# Patient Record
Sex: Female | Born: 1960 | Race: White | Hispanic: No | Marital: Single | Smoking: Former smoker
Health system: Southern US, Community
[De-identification: ages and names within clinical notes are randomized; demographics above are authoritative.]

## PROBLEM LIST (undated history)

## (undated) DIAGNOSIS — G629 Polyneuropathy, unspecified: Secondary | ICD-10-CM

## (undated) DIAGNOSIS — M51369 Other intervertebral disc degeneration, lumbar region without mention of lumbar back pain or lower extremity pain: Secondary | ICD-10-CM

## (undated) DIAGNOSIS — Z9889 Other specified postprocedural states: Secondary | ICD-10-CM

## (undated) DIAGNOSIS — F32A Depression, unspecified: Secondary | ICD-10-CM

## (undated) DIAGNOSIS — M5136 Other intervertebral disc degeneration, lumbar region: Secondary | ICD-10-CM

## (undated) DIAGNOSIS — E039 Hypothyroidism, unspecified: Secondary | ICD-10-CM

## (undated) DIAGNOSIS — T4145XA Adverse effect of unspecified anesthetic, initial encounter: Secondary | ICD-10-CM

## (undated) DIAGNOSIS — G43909 Migraine, unspecified, not intractable, without status migrainosus: Secondary | ICD-10-CM

## (undated) DIAGNOSIS — F419 Anxiety disorder, unspecified: Secondary | ICD-10-CM

## (undated) DIAGNOSIS — K219 Gastro-esophageal reflux disease without esophagitis: Secondary | ICD-10-CM

## (undated) DIAGNOSIS — R112 Nausea with vomiting, unspecified: Secondary | ICD-10-CM

## (undated) DIAGNOSIS — F4310 Post-traumatic stress disorder, unspecified: Secondary | ICD-10-CM

## (undated) DIAGNOSIS — E079 Disorder of thyroid, unspecified: Secondary | ICD-10-CM

## (undated) DIAGNOSIS — F319 Bipolar disorder, unspecified: Secondary | ICD-10-CM

## (undated) DIAGNOSIS — M1990 Unspecified osteoarthritis, unspecified site: Secondary | ICD-10-CM

## (undated) DIAGNOSIS — E7800 Pure hypercholesterolemia, unspecified: Secondary | ICD-10-CM

## (undated) DIAGNOSIS — F329 Major depressive disorder, single episode, unspecified: Secondary | ICD-10-CM

## (undated) DIAGNOSIS — T8859XA Other complications of anesthesia, initial encounter: Secondary | ICD-10-CM

## (undated) DIAGNOSIS — M797 Fibromyalgia: Secondary | ICD-10-CM

## (undated) HISTORY — DX: Pure hypercholesterolemia, unspecified: E78.00

## (undated) HISTORY — PX: KNEE ARTHROSCOPY: SUR90

## (undated) HISTORY — PX: SHOULDER SURGERY: SHX246

## (undated) HISTORY — PX: VAGINAL HYSTERECTOMY: SUR661

## (undated) HISTORY — DX: Unspecified osteoarthritis, unspecified site: M19.90

## (undated) HISTORY — PX: BILATERAL OOPHORECTOMY: SHX1221

## (undated) HISTORY — DX: Other intervertebral disc degeneration, lumbar region: M51.36

## (undated) HISTORY — DX: Major depressive disorder, single episode, unspecified: F32.9

## (undated) HISTORY — PX: CARPAL TUNNEL RELEASE: SHX101

## (undated) HISTORY — DX: Depression, unspecified: F32.A

## (undated) HISTORY — DX: Anxiety disorder, unspecified: F41.9

## (undated) HISTORY — PX: OTHER SURGICAL HISTORY: SHX169

## (undated) HISTORY — DX: Bipolar disorder, unspecified: F31.9

## (undated) HISTORY — DX: Other intervertebral disc degeneration, lumbar region without mention of lumbar back pain or lower extremity pain: M51.369

## (undated) HISTORY — DX: Disorder of thyroid, unspecified: E07.9

## (undated) HISTORY — DX: Fibromyalgia: M79.7

## (undated) HISTORY — PX: CHOLECYSTECTOMY: SHX55

## (undated) HISTORY — DX: Post-traumatic stress disorder, unspecified: F43.10

## (undated) HISTORY — DX: Polyneuropathy, unspecified: G62.9

---

## 1999-03-29 ENCOUNTER — Encounter: Admission: RE | Admit: 1999-03-29 | Discharge: 1999-06-27 | Payer: Self-pay | Admitting: Internal Medicine

## 2000-11-13 ENCOUNTER — Ambulatory Visit (HOSPITAL_COMMUNITY): Admission: RE | Admit: 2000-11-13 | Discharge: 2000-11-13 | Payer: Self-pay | Admitting: Orthopedic Surgery

## 2001-04-17 ENCOUNTER — Ambulatory Visit (HOSPITAL_COMMUNITY): Admission: RE | Admit: 2001-04-17 | Discharge: 2001-04-17 | Payer: Self-pay | Admitting: Internal Medicine

## 2001-04-17 ENCOUNTER — Encounter: Payer: Self-pay | Admitting: Internal Medicine

## 2001-08-30 ENCOUNTER — Encounter: Payer: Self-pay | Admitting: Emergency Medicine

## 2001-08-30 ENCOUNTER — Emergency Department (HOSPITAL_COMMUNITY): Admission: EM | Admit: 2001-08-30 | Discharge: 2001-08-30 | Payer: Self-pay | Admitting: Emergency Medicine

## 2002-05-22 ENCOUNTER — Inpatient Hospital Stay (HOSPITAL_COMMUNITY): Admission: EM | Admit: 2002-05-22 | Discharge: 2002-05-26 | Payer: Self-pay | Admitting: Emergency Medicine

## 2002-05-22 ENCOUNTER — Encounter: Payer: Self-pay | Admitting: Emergency Medicine

## 2002-05-23 ENCOUNTER — Encounter: Payer: Self-pay | Admitting: Internal Medicine

## 2002-05-29 ENCOUNTER — Inpatient Hospital Stay (HOSPITAL_COMMUNITY): Admission: AD | Admit: 2002-05-29 | Discharge: 2002-05-31 | Payer: Self-pay | Admitting: Internal Medicine

## 2002-05-29 ENCOUNTER — Encounter: Payer: Self-pay | Admitting: Internal Medicine

## 2002-05-30 ENCOUNTER — Encounter: Payer: Self-pay | Admitting: Obstetrics & Gynecology

## 2003-01-10 ENCOUNTER — Ambulatory Visit (HOSPITAL_COMMUNITY): Admission: RE | Admit: 2003-01-10 | Discharge: 2003-01-10 | Payer: Self-pay | Admitting: Internal Medicine

## 2003-01-10 ENCOUNTER — Encounter: Payer: Self-pay | Admitting: Internal Medicine

## 2004-03-10 ENCOUNTER — Emergency Department: Payer: Self-pay | Admitting: Unknown Physician Specialty

## 2004-05-13 ENCOUNTER — Observation Stay (HOSPITAL_COMMUNITY): Admission: EM | Admit: 2004-05-13 | Discharge: 2004-05-15 | Payer: Self-pay | Admitting: *Deleted

## 2006-06-23 ENCOUNTER — Encounter (INDEPENDENT_AMBULATORY_CARE_PROVIDER_SITE_OTHER): Payer: Self-pay | Admitting: Specialist

## 2006-06-23 ENCOUNTER — Ambulatory Visit (HOSPITAL_COMMUNITY): Admission: RE | Admit: 2006-06-23 | Discharge: 2006-06-23 | Payer: Self-pay | Admitting: Obstetrics and Gynecology

## 2007-04-05 ENCOUNTER — Emergency Department (HOSPITAL_COMMUNITY): Admission: EM | Admit: 2007-04-05 | Discharge: 2007-04-05 | Payer: Self-pay | Admitting: Emergency Medicine

## 2007-04-27 ENCOUNTER — Ambulatory Visit (HOSPITAL_COMMUNITY): Admission: RE | Admit: 2007-04-27 | Discharge: 2007-04-27 | Payer: Self-pay | Admitting: Obstetrics and Gynecology

## 2008-02-12 ENCOUNTER — Ambulatory Visit: Payer: Self-pay | Admitting: Cardiology

## 2008-02-12 ENCOUNTER — Inpatient Hospital Stay (HOSPITAL_COMMUNITY): Admission: EM | Admit: 2008-02-12 | Discharge: 2008-02-13 | Payer: Self-pay | Admitting: Emergency Medicine

## 2008-06-09 ENCOUNTER — Ambulatory Visit (HOSPITAL_COMMUNITY): Admission: RE | Admit: 2008-06-09 | Discharge: 2008-06-09 | Payer: Self-pay | Admitting: Internal Medicine

## 2008-10-15 ENCOUNTER — Ambulatory Visit: Payer: Self-pay | Admitting: Orthopedic Surgery

## 2008-10-15 DIAGNOSIS — M25569 Pain in unspecified knee: Secondary | ICD-10-CM | POA: Insufficient documentation

## 2008-10-15 DIAGNOSIS — M23302 Other meniscus derangements, unspecified lateral meniscus, unspecified knee: Secondary | ICD-10-CM | POA: Insufficient documentation

## 2008-10-15 DIAGNOSIS — M24469 Recurrent dislocation, unspecified knee: Secondary | ICD-10-CM | POA: Insufficient documentation

## 2008-10-16 ENCOUNTER — Telehealth: Payer: Self-pay | Admitting: Orthopedic Surgery

## 2008-10-21 ENCOUNTER — Ambulatory Visit (HOSPITAL_COMMUNITY): Admission: RE | Admit: 2008-10-21 | Discharge: 2008-10-21 | Payer: Self-pay | Admitting: Orthopedic Surgery

## 2008-10-27 ENCOUNTER — Encounter (INDEPENDENT_AMBULATORY_CARE_PROVIDER_SITE_OTHER): Payer: Self-pay | Admitting: *Deleted

## 2008-10-27 ENCOUNTER — Ambulatory Visit: Payer: Self-pay | Admitting: Orthopedic Surgery

## 2008-10-28 ENCOUNTER — Ambulatory Visit: Payer: Self-pay | Admitting: Orthopedic Surgery

## 2008-10-28 ENCOUNTER — Ambulatory Visit (HOSPITAL_COMMUNITY): Admission: RE | Admit: 2008-10-28 | Discharge: 2008-10-28 | Payer: Self-pay | Admitting: Orthopedic Surgery

## 2008-10-29 ENCOUNTER — Telehealth: Payer: Self-pay | Admitting: Orthopedic Surgery

## 2008-10-30 ENCOUNTER — Ambulatory Visit: Payer: Self-pay | Admitting: Orthopedic Surgery

## 2008-10-31 ENCOUNTER — Telehealth: Payer: Self-pay | Admitting: Orthopedic Surgery

## 2008-10-31 ENCOUNTER — Encounter: Payer: Self-pay | Admitting: Orthopedic Surgery

## 2008-10-31 ENCOUNTER — Encounter (HOSPITAL_COMMUNITY): Admission: RE | Admit: 2008-10-31 | Discharge: 2008-11-30 | Payer: Self-pay | Admitting: Orthopedic Surgery

## 2008-11-10 ENCOUNTER — Ambulatory Visit: Payer: Self-pay | Admitting: Orthopedic Surgery

## 2008-11-10 ENCOUNTER — Encounter (INDEPENDENT_AMBULATORY_CARE_PROVIDER_SITE_OTHER): Payer: Self-pay | Admitting: *Deleted

## 2008-11-10 DIAGNOSIS — IMO0002 Reserved for concepts with insufficient information to code with codable children: Secondary | ICD-10-CM | POA: Insufficient documentation

## 2008-11-10 DIAGNOSIS — M25469 Effusion, unspecified knee: Secondary | ICD-10-CM | POA: Insufficient documentation

## 2008-11-13 ENCOUNTER — Encounter: Payer: Self-pay | Admitting: Orthopedic Surgery

## 2008-11-18 ENCOUNTER — Ambulatory Visit: Payer: Self-pay | Admitting: Orthopedic Surgery

## 2008-11-19 ENCOUNTER — Encounter: Payer: Self-pay | Admitting: Orthopedic Surgery

## 2008-12-02 ENCOUNTER — Ambulatory Visit: Payer: Self-pay | Admitting: Orthopedic Surgery

## 2008-12-02 DIAGNOSIS — M1710 Unilateral primary osteoarthritis, unspecified knee: Secondary | ICD-10-CM | POA: Insufficient documentation

## 2008-12-24 ENCOUNTER — Encounter: Payer: Self-pay | Admitting: Orthopedic Surgery

## 2009-01-05 ENCOUNTER — Ambulatory Visit: Payer: Self-pay | Admitting: Orthopedic Surgery

## 2009-01-19 ENCOUNTER — Ambulatory Visit: Payer: Self-pay | Admitting: Orthopedic Surgery

## 2009-01-19 DIAGNOSIS — M23329 Other meniscus derangements, posterior horn of medial meniscus, unspecified knee: Secondary | ICD-10-CM | POA: Insufficient documentation

## 2009-01-19 DIAGNOSIS — M549 Dorsalgia, unspecified: Secondary | ICD-10-CM | POA: Insufficient documentation

## 2009-01-22 ENCOUNTER — Telehealth: Payer: Self-pay | Admitting: Orthopedic Surgery

## 2009-01-23 ENCOUNTER — Telehealth: Payer: Self-pay | Admitting: Orthopedic Surgery

## 2009-01-27 ENCOUNTER — Ambulatory Visit (HOSPITAL_COMMUNITY): Admission: RE | Admit: 2009-01-27 | Discharge: 2009-01-27 | Payer: Self-pay | Admitting: Orthopedic Surgery

## 2009-02-02 ENCOUNTER — Ambulatory Visit: Payer: Self-pay | Admitting: Orthopedic Surgery

## 2009-02-25 ENCOUNTER — Ambulatory Visit: Payer: Self-pay | Admitting: Orthopedic Surgery

## 2009-03-03 ENCOUNTER — Telehealth: Payer: Self-pay | Admitting: Orthopedic Surgery

## 2009-03-09 ENCOUNTER — Encounter: Payer: Self-pay | Admitting: Orthopedic Surgery

## 2009-04-08 ENCOUNTER — Ambulatory Visit: Payer: Self-pay | Admitting: Orthopedic Surgery

## 2009-05-12 ENCOUNTER — Ambulatory Visit: Payer: Self-pay | Admitting: Orthopedic Surgery

## 2009-05-12 DIAGNOSIS — S335XXA Sprain of ligaments of lumbar spine, initial encounter: Secondary | ICD-10-CM

## 2009-05-12 DIAGNOSIS — S339XXA Sprain of unspecified parts of lumbar spine and pelvis, initial encounter: Secondary | ICD-10-CM | POA: Insufficient documentation

## 2009-05-27 ENCOUNTER — Ambulatory Visit: Payer: Self-pay | Admitting: Orthopedic Surgery

## 2009-07-08 ENCOUNTER — Ambulatory Visit: Payer: Self-pay | Admitting: Orthopedic Surgery

## 2009-07-08 DIAGNOSIS — M2240 Chondromalacia patellae, unspecified knee: Secondary | ICD-10-CM | POA: Insufficient documentation

## 2009-07-23 ENCOUNTER — Observation Stay (HOSPITAL_COMMUNITY): Admission: AD | Admit: 2009-07-23 | Discharge: 2009-07-25 | Payer: Self-pay | Admitting: Internal Medicine

## 2009-07-24 ENCOUNTER — Encounter (INDEPENDENT_AMBULATORY_CARE_PROVIDER_SITE_OTHER): Payer: Self-pay | Admitting: Internal Medicine

## 2009-08-13 ENCOUNTER — Telehealth: Payer: Self-pay | Admitting: Psychology

## 2009-08-14 ENCOUNTER — Ambulatory Visit (HOSPITAL_COMMUNITY): Payer: Self-pay | Admitting: Psychology

## 2009-08-26 ENCOUNTER — Telehealth: Payer: Self-pay | Admitting: Orthopedic Surgery

## 2009-08-28 ENCOUNTER — Encounter: Payer: Self-pay | Admitting: Orthopedic Surgery

## 2009-12-09 ENCOUNTER — Emergency Department (HOSPITAL_COMMUNITY): Admission: EM | Admit: 2009-12-09 | Discharge: 2009-12-09 | Payer: Self-pay | Admitting: Emergency Medicine

## 2010-01-04 ENCOUNTER — Telehealth (INDEPENDENT_AMBULATORY_CARE_PROVIDER_SITE_OTHER): Payer: Self-pay | Admitting: *Deleted

## 2010-01-14 ENCOUNTER — Telehealth: Payer: Self-pay | Admitting: Orthopedic Surgery

## 2010-01-14 ENCOUNTER — Ambulatory Visit: Payer: Self-pay | Admitting: Orthopedic Surgery

## 2010-01-14 DIAGNOSIS — M5126 Other intervertebral disc displacement, lumbar region: Secondary | ICD-10-CM | POA: Insufficient documentation

## 2010-01-25 ENCOUNTER — Encounter (INDEPENDENT_AMBULATORY_CARE_PROVIDER_SITE_OTHER): Payer: Self-pay | Admitting: *Deleted

## 2010-01-25 ENCOUNTER — Ambulatory Visit (HOSPITAL_COMMUNITY): Admission: RE | Admit: 2010-01-25 | Discharge: 2010-01-25 | Payer: Self-pay | Admitting: Internal Medicine

## 2010-02-01 ENCOUNTER — Encounter (HOSPITAL_COMMUNITY)
Admission: RE | Admit: 2010-02-01 | Discharge: 2010-03-03 | Payer: Self-pay | Source: Home / Self Care | Attending: Orthopedic Surgery | Admitting: Orthopedic Surgery

## 2010-02-02 ENCOUNTER — Ambulatory Visit: Payer: Self-pay | Admitting: Orthopedic Surgery

## 2010-02-02 DIAGNOSIS — M7710 Lateral epicondylitis, unspecified elbow: Secondary | ICD-10-CM | POA: Insufficient documentation

## 2010-02-03 ENCOUNTER — Encounter: Payer: Self-pay | Admitting: Orthopedic Surgery

## 2010-02-04 ENCOUNTER — Encounter: Payer: Self-pay | Admitting: Orthopedic Surgery

## 2010-02-15 ENCOUNTER — Telehealth: Payer: Self-pay | Admitting: Orthopedic Surgery

## 2010-03-02 ENCOUNTER — Telehealth: Payer: Self-pay | Admitting: Orthopedic Surgery

## 2010-04-05 ENCOUNTER — Encounter: Payer: Self-pay | Admitting: Orthopedic Surgery

## 2010-04-09 ENCOUNTER — Encounter: Payer: Self-pay | Admitting: Orthopedic Surgery

## 2010-04-10 ENCOUNTER — Encounter: Payer: Self-pay | Admitting: Internal Medicine

## 2010-04-20 NOTE — Assessment & Plan Note (Signed)
Summary: 2 WK RE-CK BACK/WELLPATH/CAF   Visit Type:  Follow-up Referring Provider:  self Primary Provider:  Dr. Ouida Sills  CC:  back pain.  History of Present Illness: followup visit for [place] she had a knee arthroscopy didn't develop some back pain we treated her with Vicodin, anti-inflammatory ibuprofen, and a muscle relaxer Robaxin  Her back is much better although she had a bad day after a long day at work yesterday.  She still has some intermittent symptoms on the LEFT side of her lower spine and upper iliac region  Exam shows tenderness in the LEFT gluteal region really the back is nontender especially in the midline a little more tenderness on the LEFT side of the back  No radicular symptoms  Recommend continue same medications follow up appointment 3 months  Allergies: No Known Drug Allergies   Impression & Recommendations:  Problem # 1:  LUMBOSACRAL STRAIN (ICD-846.0) Assessment Improved  Orders: Est. Patient Level II ([address])  Patient Instructions: 1)  Please schedule a follow-up appointment in 3 months.

## 2010-04-20 NOTE — Assessment & Plan Note (Signed)
Summary: CHECK KNEE/RE-SCHEDULE SURGERY LT KNEE/ARTHR/WELLPATH/CAF   History of Present Illness: I saw [name] in the office today for a followup visit.  She is a 50 years old woman with the complaint of:  DX: left knee meniscal tear.  Treatment: Ibuprofen, rest, exercises.  MEDS:  Vicodin 5 only as needed as well as Ibuprofen 800mg  at night.  Complaints:  pain not as bad as it was.  Today, scheduled for: to schedule arthroscopy, wants to discuss.  Vicodin, Syntroid, Topamax, Glipizide, Simvastatin, Metformin, Flexeril.  Her pain has resolved significantly to the point where no surgery is needed.  Her knee exam was essentially remarkable only for tenderness in the anterolateral aspect and the has anserine bursa. The joint line is nontender. There is no joint effusion. Her strength was normal. The knee was stable. Meniscal signs were negative including McMurray's exam.  Assessment: Recommend waiting on surgery. Continue activity modification and Vicodin as needed.  Followup in 3 months or sooner if warranted    Allergies: No Known Drug Allergies   Impression & Recommendations:  Problem # 1:  DERANGEMENT OF POSTERIOR HORN OF MEDIAL MENISCUS (ICD-717.2)  Orders: Est. Patient Level II (40102)  Patient Instructions: 1)  3 months f/u or sooner if knee worsens

## 2010-04-20 NOTE — Medication Information (Signed)
Summary: Tax adviser   Imported By: Cammie Sickle 02/12/2010 14:31:05  _____________________________________________________________________  External Attachment:    Type:   Image     Comment:   External Document

## 2010-04-20 NOTE — Letter (Signed)
Summary: Historic Patient File  Historic Patient File   Imported By: Elvera Maria 05/15/2009 10:29:45  _____________________________________________________________________  External Attachment:    Type:   Image     Comment:   history form

## 2010-04-20 NOTE — Progress Notes (Signed)
Summary: call from patient about prescription  Phone Note Call from Patient   Caller: Patient Summary of Call: Patient asking for possible refill for Hydrocodone/Vicodin for LT knee pain,now that she has her appointment scheduled, which is 01/14/10.  States was denied previously. If okayed, her pharmacy is [facility]. Initial call taken by: Cammie Sickle,  January 04, 2010 9:51 AM    Prescriptions: NORCO 5-325 MG TABS (HYDROCODONE-ACETAMINOPHEN) one by mouth q 4 hrs as needed pain  #42 x 1   Entered by:   Ether Griffins   Authorized by:   Fuller Canada MD   Signed by:   Ether Griffins on 01/04/2010   Method used:   Telephoned to ...       [facility]* (retail)       [address]       [place], [place]  [address]       Ph: [phone]       Fax: [phone]   RxID:   [id number]

## 2010-04-20 NOTE — Medication Information (Signed)
Summary: Tax adviser   Imported By: Cammie Sickle 07/06/2009 06:58:01  _____________________________________________________________________  External Attachment:    Type:   Image     Comment:   External Document

## 2010-04-20 NOTE — Letter (Signed)
Summary: Out of Work  [facility]  [address]  [place], [place] [address]   Phone: [phone]  Fax: [phone]    May 12, 2009   Employee:  [name] [facility]    To Whom It May Concern:   For Medical reasons, please excuse the above named employee from work for the following dates:           Start:         May 11, 2009  End/Return to work: May 12, 2009   If you need additional information, please feel free to contact our office.         Sincerely,    Terrance Mass, MD

## 2010-04-20 NOTE — Miscellaneous (Signed)
Summary: pt order  Clinical Lists Changes  Orders: Added new Referral order of Physical Therapy Referral (PT) - Signed 

## 2010-04-20 NOTE — Progress Notes (Signed)
Summary: call from patient, postponed appointment  Phone Note Call from Patient   Caller: Patient Summary of Call: Patient called to postpone fol/up appointment which is scheduled for tomorrow, 08/27/09, due to no insurance at this time. She is checking further  re: other options, incl'g financial assistance. Will call back to re-schedule. Initial call taken by: Cammie Sickle,  August 26, 2009 11:47 AM

## 2010-04-20 NOTE — Letter (Signed)
Summary: Out of Work  [facility]  [address]  [place], [place] [address]   Phone: [phone]  Fax: [phone]    April 08, 2009   Employee:  [name] [facility]    To Whom It May Concern:   For Medical reasons, please excuse the above named employee from work for the following dates:  Start:   April 08, 2009- morning appointment in our office today  End/Return to work:  April 08, 2009, following appointment.  If you need additional information, please feel free to contact our office.         Sincerely,    Terrance Mass, MD

## 2010-04-20 NOTE — Assessment & Plan Note (Signed)
Summary: RE-CK LT KNEE/WELLPATH/CAF   Visit Type:  Follow-up Referring Provider:  self Primary Provider:  Dr. Ouida Sills  CC:  recheck left knee.  History of Present Illness: I saw [name] in the office today for a followup visit.  She is a 50 years old woman with the complaint of:  recheck left knee meniscal tear.  ROS: 2nd Problem Back pain stable at  present no radiculopathy   Still some soreness. stiff after sitting, steps are somewhat bothersome   Vicodin, Syntroid, Topamax, Glipizide, Simvastatin, Metformin, Robaxin and Lyrica.  Allergies: No Known Drug Allergies  Physical Exam  Additional Exam:  GENERAL: Appearance is normal   CDV: normal pulse and temperature   Skin: was normal   Neuro: sensation was normal   MSK left knee  * Inspection no tenderness, no swelling; grinding in the PTF joint  * ROM 0-125 *MOTOR: 5/5  *Stability no joint laxity     Impression & Recommendations:  Problem # 1:  DERANGEMENT OF POSTERIOR HORN OF MEDIAL MENISCUS (ICD-717.2) Assessment Comment Only  stable   Orders: Est. 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 One by mouth q 4 hrs as needed pain    Robaxin 500 Mg Tabs (Methocarbamol) .Marland Kitchen... 1 by mouth q 6 hrs  Orders: Est. Patient Level III (44010)  Patient Instructions: 1)  Please schedule a follow-up appointment in 6 months.

## 2010-04-20 NOTE — Letter (Signed)
Summary: Medical record request Disability Determin  Medical record request Disability Determin   Imported By: Cammie Sickle 09/14/2009 09:34:38  _____________________________________________________________________  External Attachment:    Type:   Image     Comment:   External Document

## 2010-04-20 NOTE — Letter (Signed)
Summary: History form  History form   Imported By: Jacklynn Ganong 02/03/2010 09:29:26  _____________________________________________________________________  External Attachment:    Type:   Image     Comment:   External Document

## 2010-04-20 NOTE — Progress Notes (Signed)
Summary: Help getting services  Phone Note Call from Patient   Caller: Patient Summary of Call: Patient called trying to get services - having trouble with [facility] returning calls.  [name] is not a provider for her insurance.  I did contact the [place] office myself and spoke to [facility].  Appts are available.  I urged [name] to call back ASAP as I had no difficulty getting through today.  She can call back if she continues to have difficulty. Initial call taken by: Spero Geralds PsyD,  Aug 13, 2009 12:07 PM

## 2010-04-20 NOTE — Letter (Signed)
Summary: Work [facility]  [address]  [place], [place] [address]   Phone: [phone]  Fax: [phone]    Today's Date: May 27, 2009  Name of Patient: [name]  The above named patient had a medical visit today at:  am / pm.  Please take this into consideration when reviewing the time away from work/school.    Special Instructions:  [  ] None  [  ] To be off the remainder of today, returning to the normal work / school schedule tomorrow.  [  ] To be off until the next scheduled appointment on ______________________.  [  ] Other ________________________________________________________________ ________________________________________________________________________   Sincerely yours,   Cammie Sickle

## 2010-04-20 NOTE — Assessment & Plan Note (Signed)
Summary: NEW PROB/LT ELBOW PAIN,SWELLING/NEED XRAY/[facility] DISCOUNT/CAF   Visit Type:  new problem Referring Provider:  self Primary Provider:  Dr. Ouida Sills  CC:  left elbow pain.  History of Present Illness: I saw [name] in the office today for an initial visit.  She is a 50 years old woman with the complaint of:  left elbow pain  Xrays today.  Medications: Norco 5mg . Updated in chart. Patient states pain medicine is not working, does not think they are strong enough.  The patient complained of lateral elbow pain since a fall back in June of this year. She now has throbbing, burning, pain, which is 9/10 over the lateral elbow. Timing. She relates as intermittent and started gradually. Associated symptoms include numbness and tingling.  She also complains that it is hard for her to hold things and pick things up. She is already on Lortab 5 mg, Robaxin for her other joint problems, including lumbar pain, and she is in therapy for her lumbar spine condition.    Allergies: No Known Drug Allergies  Past History:  Past Medical History: diabetes htn thyroid cholesterol fibromyalgia Anxiety Depression  Past Surgical History: Carpal Tunnel Release bilateral T A H and B S O Left shoulder Cholecystectomy Knee Arthroscopy right  Review of Systems Musculoskeletal:  Complains of joint pain, swelling, instability, stiffness, and muscle pain; denies redness and heat. Psychiatric:  Complains of depression and anxiety; denies nervousness and hallucinations. Immunology:  Complains of seasonal allergies; denies sinus problems and allergic to bee stings.  The review of systems is negative for Constitutional, Cardiovascular, Respiratory, Gastrointestinal, Genitourinary, Neurologic, Endocrine, Skin, HEENT, and Hemoatologic.   Shoulder/Elbow Exam  General:    Well-developed, well-nourished, abnormal body habitus; no deformities, normal grooming.    Skin:    Intact, no scars,  lesions, rashes, cafe au lait spots or bruising.    Inspection:    swelling: lateral elbow LEFT elbow  Palpation:    tenderness over the lateral wound at the condyle and lateral soft tissue mass  Vascular:    Radial, ulnar, brachial, and axillary pulses 2+ and symmetric; capillary refill less than 2 seconds; no evidence of ischemia, clubbing, or cyanosis.    Sensory:    Gross sensation intact in the upper extremities.    Motor:    Normal strength in the upper extremities.    Elbow Exam:    Left:    Inspection/Palpation:  range of motion of the LEFT elbow is -10, extension, full flexion. Passive range of motion 5, hyperextension, full flexion. There is tenderness over the lateral epicondyle and a positive wrist extension elbow extension test.     Impression & Recommendations:  Problem # 1:  LATERAL EPICONDYLITIS (ICD-726.32) Assessment New  AP lateral, LEFT elbow.  I do not see any evidence of previous fracture. There is no malalignment or 2 spurs at the medial lateral aspect of the distal humerus.  Impression neuroma, LEFT elbow.  Patient will be treated for lateral epicondylitis  Orders: Est. Patient Level IV (78469) Elbow x-ray, 2 views (62952)  Patient Instructions: 1)  You have Tennis Elbow  2)  Start exercises as indicated  3)  apply ice three times a day for 30 minutes  4)  follow instructions in Tennis elbow handout 5)  wear tennis elbow brace  for 6 weeks  6)  (will have to buy ) 7)  elbow follow as needed  8)  Lumbar spine f/u after PT; make appt for 6 weeks  Prescriptions: [facility]  5-325 MG TABS (HYDROCODONE-ACETAMINOPHEN) one by mouth q 4 hrs as needed pain  #84 x 1   Entered and Authorized by:   Fuller Canada MD   Signed by:   Fuller Canada MD on 02/02/2010   Method used:   Print then Give to Patient   RxID:   [id number] ROBAXIN 500 MG TABS (METHOCARBAMOL) 1 by mouth Q 6 HRS  #60 x 2   Entered and Authorized by:   Fuller Canada MD    Signed by:   Fuller Canada MD on 02/02/2010   Method used:   Print then Give to Patient   RxID:   [id number]    Orders Added: 1)  Est. Patient Level IV [01027] 2)  Elbow x-ray, 2 views [73070]
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
## 2010-04-20 NOTE — Progress Notes (Signed)
Summary: Still having knee/back pain  Phone Note Call from Patient   Summary of Call: [name] (February 07, 2061) still having knee and back pain after 2 weeks of therapy.  Says that the traction hurts a lot, wants to know if she needs to continue with the therapy, but discontinue the traction. Her # [phone] Initial call taken by: Jacklynn Ganong,  February 15, 2010 9:44 AM  Follow-up for Phone Call        lets stop the tratction, call back on Thursday and let us know if that helped  Follow-up by: Fuller Canada MD,  February 15, 2010 2:38 PM  Additional Follow-up for Phone Call Additional follow up Details #1::        Left a message to call back Additional Follow-up by: Jacklynn Ganong,  February 15, 2010 3:09 PM    Additional Follow-up for Phone Call Additional follow up Details #2::    Patient called back 02/16/10, was advised of doctor's reply Follow-up by: Jacklynn Ganong,  February 16, 2010 9:10 AM

## 2010-04-20 NOTE — Letter (Signed)
Summary: Work [facility]  [address]  [place], [place] [address]   Phone: [phone]  Fax: [phone]     Today's Date: May 27, 2009  Name of Patient: [name]  The above named patient had a medical visit today at:  9:30 am  Please take this into consideration when reviewing the time away from work.    Special Instructions:   [  ] To Return to the normal work schedule today, May 27, 2009.   [  ] Other ________________________________________________________________ ________________________________________________________________________   Sincerely,   Terrance Mass, MD

## 2010-04-20 NOTE — Miscellaneous (Signed)
Summary: Rehab Report  Rehab Report   Imported By: Jacklynn Ganong 02/04/2010 15:32:53  _____________________________________________________________________  External Attachment:    Type:   Image     Comment:   External Document

## 2010-04-20 NOTE — Letter (Signed)
Summary: Work [facility]  [address]  [place], [place] [address]   Phone: [phone]  Fax: [phone]     Today's Date: May 12, 2009  Name of Patient: [name]  The above named patient had a medical visit today at: 4:00 pm.  Please take this into consideration when reviewing the time away from work.    Special Instructions:   [ X ] To be off work the remainder of today, returning to regular work schedule tomorrow,       May 13, 2009.     Sincerely,   Terrance Mass, MD

## 2010-04-20 NOTE — Assessment & Plan Note (Signed)
Summary: RE-CK/XRAY LT KNEE/M. [facility] DISCOUNT/CAF   Visit Type:  Follow-up Referring Provider:  self Primary Provider:  Dr. Ouida Sills  CC:  right buttock pain.  History of Present Illness:  I saw [name] in the office today for a followup visit.  She is a 50 years old woman with the complaint of:  RIGHT buttock pain, LEFT buttock pain, bilateral occasional leg pain and persistent RIGHT radicular pain as well as pain in her LEFT knee  she complains of pain when she is sitting in her car with bilateral leg pain especially on the RIGHT side.  She also has associated back pain.  She does not complain of nocturnal sweating or fever.  No bowel or bladder dysfunction.   Meds: Norco 5, Syntroid, , Glipizide, Simvastatin, Metformin,  Lyrica, Prozac, Abilify.    Allergies: No Known Drug Allergies  Past History:  Past Medical History: Last updated: 05/12/2009 diabetes htn thyroid cholesterol fibromyalgia  Past Surgical History: Last updated: 10/15/2008 Carpal Tunnel Release bilateral T A H and B S O Left shoulder  Family history reviewed for relevance to current acute and chronic problems. Social history (including risk factors) reviewed for relevance to current acute and chronic problems.  Family History: Reviewed history from 05/12/2009 and no changes required. FH of Cancer:  Family History of Diabetes Family History Coronary Heart Disease female < 4 Family History of Arthritis Hx, family, chronic respiratory condition  Social History: Reviewed history from 05/12/2009 and no changes required. 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  She has tenderness on both sides of the spine as well.  She has tenderness in the RIGHT and LEFT buttocks.  She has a positive RIGHT straight leg raise in the seated position.  Her range of motion in terms of her knee RIGHT and LEFT and ankle RIGHT and LEFT is normal with some pain on range of motion in the LEFT knee.  However, the knee is stable.  Skin RIGHT and LEFT leg, back and neck is normal  Pulses and temperature are normal with no edema in the lower extremities  She has normal sensation in both lower extremities and 3+ RIGHT knee reflex 2+ LEFT knee reflex 1+ bilateral ankle jerks.     Impression & Recommendations:  Problem # 1:  BACK PAIN (ICD-724.5) Assessment Deteriorated  She is really exhibiting classic posturing and symptoms of lumbar disc herniation with the following symptoms and signs.    Bilateral leg pain with sitting Positive straight leg raise Pain despite taking Vicodin and ibuprofen Pain greater than 6 months  Recommend MRI lumbar spine, dose pack for acute symptoms.  Norco for pain.  Her updated medication list for this problem includes:    Vicodin 5-500 Mg Tabs (Hydrocodone-acetaminophen) ..... One by mouth q 4 hrs as needed pain    Robaxin 500 Mg Tabs (Methocarbamol) .Marland Kitchen... 1 by mouth q 6 hrs    Norco 5-325 Mg Tabs (Hydrocodone-acetaminophen) ..... One by mouth q 4 hrs as needed pain  Problem # 2:  H N P-LUMBAR (ICD-722.10) Assessment: New  Medications Added to Medication List This Visit: 1)  Prednisone (pak) 10 Mg Tabs (Prednisone) .... As directed Prescriptions: PREDNISONE (PAK) 10 MG TABS (PREDNISONE) as directed  #1 x 1   Entered and Authorized by:   Fuller Canada MD   Signed by:   Fuller Canada MD on 01/14/2010   Method used:   Print then Give to Patient   RxID:   [id number] NORCO 5-325 MG TABS (HYDROCODONE-ACETAMINOPHEN)  one by mouth q 4 hrs as needed pain  #42 x 1   Entered and Authorized by:   Fuller Canada MD   Signed by:   Fuller Canada MD on 01/14/2010   Method used:   Print then Give to Patient   RxID:   [id number]   Appended Document: RE-CK/XRAY LT KNEE/M. [facility] DISCOUNT/CAF

## 2010-04-20 NOTE — Progress Notes (Signed)
Summary: MRI appointment  Phone Note Outgoing Call   Call placed by: Waldon Reining,  January 14, 2010 4:10 PM Call placed to: Patient Action Taken: Appt scheduled Summary of Call: I called to give the patient her MRI appointment at [facility] on 01-20-10 at 8:30 am. Patient has [facility] discount. Patient will follow up here.

## 2010-04-20 NOTE — Letter (Signed)
Summary: Out of Work  [facility]  [address]  [place], [place] [address]   Phone: [phone]  Fax: [phone]    July 08, 2009   Employee:  RAYOLA [employer]ERH[employer] [facility]    To Whom It May Concern:   For Medical reasons, please excuse the above named employee from work for the following dates:  Start:   July 08, 2009  * patient/employee had an 8:30am appointment today  End/Return to work:   July 08, 2009  If you need additional information, please feel free to contact our office.         Sincerely,    Terrance Mass, MD

## 2010-04-22 NOTE — Miscellaneous (Signed)
Summary: Discharge from PT  Discharge from PT   Imported By: Jacklynn Ganong 04/16/2010 12:59:22  _____________________________________________________________________  External Attachment:    Type:   Image     Comment:   External Document

## 2010-04-22 NOTE — Progress Notes (Signed)
Summary: left knee still hurting  Phone Note Call from Patient   Summary of Call: [name] ([birth year]) has finished PT for her back but her left knee is still hurting pretty much all the time, Seems like it "catches" and has actually gone out on her one time. Asked if you need to see her for the left knee or what do you advise. Her # [phone] Initial call taken by: Jacklynn Ganong,  March 02, 2010 9:53 AM  Follow-up for Phone Call        no further appoinemnts needed at this time   just continue the medications  Follow-up by: Fuller Canada MD,  March 02, 2010 9:56 AM  Additional Follow-up for Phone Call Additional follow up Details #1::        Patient advised Additional Follow-up by: Jacklynn Ganong,  March 02, 2010 10:41 AM

## 2010-04-25 ENCOUNTER — Emergency Department (HOSPITAL_COMMUNITY): Payer: Self-pay

## 2010-04-25 ENCOUNTER — Observation Stay (HOSPITAL_COMMUNITY)
Admission: EM | Admit: 2010-04-25 | Discharge: 2010-04-26 | Disposition: A | Discharge status: Home / Self Care | Payer: Self-pay | Attending: Internal Medicine | Admitting: Internal Medicine

## 2010-04-25 DIAGNOSIS — Z23 Encounter for immunization: Secondary | ICD-10-CM | POA: Insufficient documentation

## 2010-04-25 DIAGNOSIS — H539 Unspecified visual disturbance: Secondary | ICD-10-CM | POA: Insufficient documentation

## 2010-04-25 DIAGNOSIS — Z79899 Other long term (current) drug therapy: Secondary | ICD-10-CM | POA: Insufficient documentation

## 2010-04-25 DIAGNOSIS — E039 Hypothyroidism, unspecified: Secondary | ICD-10-CM | POA: Insufficient documentation

## 2010-04-25 DIAGNOSIS — E119 Type 2 diabetes mellitus without complications: Secondary | ICD-10-CM | POA: Insufficient documentation

## 2010-04-25 DIAGNOSIS — E785 Hyperlipidemia, unspecified: Secondary | ICD-10-CM | POA: Insufficient documentation

## 2010-04-25 DIAGNOSIS — G43909 Migraine, unspecified, not intractable, without status migrainosus: Principal | ICD-10-CM | POA: Insufficient documentation

## 2010-04-25 DIAGNOSIS — K7460 Unspecified cirrhosis of liver: Secondary | ICD-10-CM | POA: Insufficient documentation

## 2010-04-25 DIAGNOSIS — F411 Generalized anxiety disorder: Secondary | ICD-10-CM | POA: Insufficient documentation

## 2010-04-25 DIAGNOSIS — IMO0001 Reserved for inherently not codable concepts without codable children: Secondary | ICD-10-CM | POA: Insufficient documentation

## 2010-04-25 LAB — COMPREHENSIVE METABOLIC PANEL
ALT: 101 U/L — ABNORMAL HIGH (ref 0–35)
AST: 74 U/L — ABNORMAL HIGH (ref 0–37)
Albumin: 4.5 g/dL (ref 3.5–5.2)
Alkaline Phosphatase: 68 U/L (ref 39–117)
BUN: 10 mg/dL (ref 6–23)
CO2: 31 mEq/L (ref 19–32)
Calcium: 9.6 mg/dL (ref 8.4–10.5)
Chloride: 95 mEq/L — ABNORMAL LOW (ref 96–112)
Creatinine, Ser: 0.86 mg/dL (ref 0.4–1.2)
GFR calc Af Amer: >60 mL/min (ref >60)
GFR calc non Af Amer: >60 mL/min (ref >60)
Glucose, Bld: 120 mg/dL — ABNORMAL HIGH (ref 70–99)
Potassium: 4.6 mEq/L (ref 3.5–5.1)
Sodium: 136 mEq/L (ref 135–145)
Total Bilirubin: 0.5 mg/dL (ref 0.3–1.2)
Total Protein: 8.3 g/dL (ref 6.0–8.3)

## 2010-04-25 LAB — URINALYSIS, ROUTINE W REFLEX MICROSCOPIC
Bilirubin Urine: NEGATIVE
Hgb urine dipstick: NEGATIVE
Ketones, ur: NEGATIVE mg/dL
Nitrite: NEGATIVE
Protein, ur: NEGATIVE mg/dL
Specific Gravity, Urine: 1.025 (ref 1.005–1.030)
Urine Glucose, Fasting: NEGATIVE mg/dL
Urobilinogen, UA: 0.2 mg/dL (ref 0.0–1.0)
pH: 6 (ref 5.0–8.0)

## 2010-04-25 LAB — CBC
HCT: 43.7 % (ref 36.0–46.0)
Hemoglobin: 15.5 g/dL — ABNORMAL HIGH (ref 12.0–15.0)
MCH: 32.2 pg (ref 26.0–34.0)
MCHC: 35.5 g/dL (ref 30.0–36.0)
MCV: 90.9 fL (ref 78.0–100.0)
Platelets: 283 10*3/uL (ref 150–400)
RBC: 4.81 MIL/uL (ref 3.87–5.11)
RDW: 12.6 % (ref 11.5–15.5)
WBC: 8.6 10*3/uL (ref 4.0–10.5)

## 2010-04-25 LAB — RAPID URINE DRUG SCREEN, HOSP PERFORMED
Amphetamines: NOT DETECTED
Barbiturates: NOT DETECTED
Benzodiazepines: POSITIVE — AB
Cocaine: NOT DETECTED
Opiates: NOT DETECTED
Tetrahydrocannabinol: NOT DETECTED

## 2010-04-25 LAB — GLUCOSE, CAPILLARY
Glucose-Capillary: 110 mg/dL — ABNORMAL HIGH (ref 70–99)
Glucose-Capillary: 131 mg/dL — ABNORMAL HIGH (ref 70–99)

## 2010-04-25 LAB — DIFFERENTIAL
Basophils Absolute: 0 10*3/uL (ref 0.0–0.1)
Basophils Relative: 1 % (ref 0–1)
Eosinophils Absolute: 0.2 10*3/uL (ref 0.0–0.7)
Eosinophils Relative: 3 % (ref 0–5)
Lymphocytes Relative: 49 % — ABNORMAL HIGH (ref 12–46)
Lymphs Abs: 4.2 10*3/uL — ABNORMAL HIGH (ref 0.7–4.0)
Monocytes Absolute: 0.6 10*3/uL (ref 0.1–1.0)
Monocytes Relative: 7 % (ref 3–12)
Neutro Abs: 3.5 10*3/uL (ref 1.7–7.7)
Neutrophils Relative %: 41 % — ABNORMAL LOW (ref 43–77)

## 2010-04-25 LAB — AMMONIA: Ammonia: 13 umol/L (ref 11–35)

## 2010-04-25 LAB — POCT CARDIAC MARKERS
CKMB, poc: 2.9 ng/mL (ref 1.0–8.0)
Myoglobin, poc: 72.9 ng/mL (ref 12–200)
Troponin i, poc: <0.05 ng/mL (ref 0.00–0.09)

## 2010-04-26 LAB — GLUCOSE, CAPILLARY: Glucose-Capillary: 147 mg/dL — ABNORMAL HIGH (ref 70–99)

## 2010-04-28 NOTE — Letter (Signed)
Summary: medical records request [place] [facility]  medical records request [place] [facility]   Imported By: Cammie Sickle 04/23/2010 14:42:37  _____________________________________________________________________  External Attachment:    Type:   Image     Comment:   External Document

## 2010-05-04 NOTE — H&P (Signed)
[name], [name]              ACCOUNT NO.:  [id number]  MEDICAL RECORD NO.:  [id number]           PATIENT TYPE:  I  LOCATION:  A313                          FACILITY:  [facility]  PHYSICIAN:  Kingsley Callander. Ouida Sills, MD       DATE OF BIRTH:  August 23, 1960  DATE OF ADMISSION:  04/25/2010 DATE OF DISCHARGE:  02/06/2012LH                             HISTORY & PHYSICAL   CHIEF COMPLAINT:  Headache.  HISTORY OF PRESENT ILLNESS:  This patient is a 50 year old white female who presented to the emergency room with a knife-like headache and associated change in vision with her left eye.  She described first blurry vision and then difficulty seeing well with the left eye.  She has a history of migraine headaches.  She has recently been off Topamax as her preventive therapy.  She has also not had access to Imitrex due to having lost her job and not yet been enrolled with Medicaid.  Upon evaluation in the emergency room, she underwent a CT scan of the brain which revealed no evidence of hemorrhage or stroke.  She was hospitalized overnight for observation.  She has not experienced any focal weakness or change in coordination.  PAST MEDICAL HISTORY: 1. Migraine headaches. 2. Diabetes. 3. Fibromyalgia. 4. Hyperlipidemia. 5. Nonalcoholic fatty liver disease. 6. Anxiety. 7. Hypothyroidism. 8. Hysterectomy. 9. Shoulder surgery. 10.Carpal tunnel syndrome. 11.History of tubal pregnancy.  MEDICATIONS: 1. Lyrica 75 mg b.i.d. 2. Metformin ER 2000 mg daily. 3. Glipizide XL 10 mg daily. 4. Imitrex 100 mg daily p.r.n. 5. Ativan 0.5 to 1 mg q.6 h. p.r.n. 6. Synthroid 100 mcg daily. 7. Simvastatin 40 mg daily. 8. Topamax 100 mg daily. 9. Estrace 1 mg daily.  ALLERGIES:  None.  SOCIAL HISTORY:  She does not use tobacco, alcohol or recreational substances.  FAMILY HISTORY:  Her mother has had hypertension and diabetes.  Her brother and sister had diabetes.  REVIEW OF SYSTEMS:  She denies fever, loss  of consciousness, change in strength or coordination.  PHYSICAL EXAMINATION:  VITAL SIGNS:  Temperature 97.3, pulse 87, respirations 18, blood pressure 123/78, oxygen saturation 97% on room air.  GENERAL:  Alert and in no distress. HEENT:  The eyes, nose and oropharynx appear unremarkable.  Head is atraumatic, normocephalic. NECK:  Supple with no carotid bruits, JVD or thyromegaly. LUNGS:  Clear. HEART:  Regular with no murmurs. ABDOMEN:  Obese, nontender with no hepatosplenomegaly. EXTREMITIES:  No cyanosis, clubbing or edema. NEURO:  Pupils are equal, round and reactive to light.  Extraocular movements are intact.  Cranial nerves II-XII are grossly intact.  She has normal movement in her extremities and has normal strength and sensation in her arms, legs and face. LYMPH NODES:  No cervical or supraclavicular enlargement. SKIN:  Warm and dry.  LABORATORY DATA:  White count 8.6, hemoglobin 15.5, platelets 283,000, 41 segs, 49 lymphs, glucose 120.  Sodium 136, potassium 4.6, bicarb 31, BUN 10, creatinine 0.86, SGOT 74, SGPT 101, alkaline phosphatase 68, calcium 9.6, albumin 4.5, ammonia 13.  Urine drug screen is positive for benzodiazepines only.  Her urinalysis is negative.  Troponin-I was less  than 0.05 with a CK-MB of 2.9.  A chest x-ray reveals no active cardiopulmonary process.  A head CT scan reveals no significant abnormality.  IMPRESSION/PLAN: 1. Migraine headache.  Her headache symptoms and left eye symptoms     have now resolved.  She will be restarted on preventive therapy     with Topamax and will be treated further with Imitrex on a p.r.n.     basis.  She was reassured there is no sign of acute stroke. 2. Diabetes.  Continue metformin and glipizide XL. 3. Fibromyalgia.  Continue Lyrica. 4. Nonalcoholic fatty liver disease, stable. 5. Hyperlipidemia.  Continue simvastatin. 6. Hypothyroidism.  Continue Synthroid.     Kingsley Callander. Ouida Sills, MD     ROF/MEDQ  D:   04/26/2010  T:  04/26/2010  Job:  [id number]  Electronically Signed by Carylon Perches MD on 04/27/2010 07:54:01 AM

## 2010-05-04 NOTE — Discharge Summary (Signed)
  [name], [name]              ACCOUNT NO.:  [id number]  MEDICAL RECORD NO.:  [id number]           PATIENT TYPE:  I  LOCATION:  A313                          FACILITY:  [facility]  PHYSICIAN:  Kingsley Callander. Ouida Sills, MD       DATE OF BIRTH:  11-Jun-1960  DATE OF ADMISSION:  04/25/2010 DATE OF DISCHARGE:  02/06/2012LH                              DISCHARGE SUMMARY   DISCHARGE DIAGNOSES: 1. Migraine headache. 2. Diabetes. 3. Fibromyalgia. 4. Hyperlipidemia. 5. Nonalcoholic fatty liver disease. 6. Anxiety. 7. Hypothyroidism.  DISCHARGE MEDICATIONS: 1. Metformin ER 2000 mg daily. 2. Glipizide XL 10 mg daily. 3. Lyrica 75 mg b.i.d. 4. Synthroid 100 mcg daily. 5. Simvastatin 40 mg daily. 6. Topamax 100 mg daily. 7. Estrace 1 mg daily. 8. Ativan 1 mg one half to one q.6 p.r.n. 9. Imitrex 100 mg daily p.r.n. 10.Aspirin 81 mg daily.  HOSPITAL COURSE:  This patient is a 50 year old female who presented with a knife-like headache and associated left-sided visual disturbances.  She underwent CT scan of the brain in the emergency room which revealed no sign of stroke.  She was hospitalized for observation. Her symptoms resolved.  Her condition at discharge is much improved. She was felt to be stable for discharge on the morning of the 6th with followup in the office in 2 weeks.  She has recently been out of Topamax and Imitrex.  These will be restarted.    Kingsley Callander. Ouida Sills, MD    ROF/MEDQ  D:  04/26/2010  T:  04/26/2010  Job:  [id number]  Electronically Signed by Carylon Perches MD on 04/27/2010 07:54:03 AM

## 2010-05-10 ENCOUNTER — Other Ambulatory Visit (HOSPITAL_COMMUNITY): Payer: Self-pay | Admitting: Internal Medicine

## 2010-05-10 DIAGNOSIS — N644 Mastodynia: Secondary | ICD-10-CM

## 2010-05-12 ENCOUNTER — Ambulatory Visit (HOSPITAL_COMMUNITY)
Admission: RE | Admit: 2010-05-12 | Discharge: 2010-05-12 | Disposition: A | Discharge status: Home / Self Care | Payer: Self-pay | Source: Ambulatory Visit | Attending: Internal Medicine | Admitting: Internal Medicine

## 2010-05-12 ENCOUNTER — Other Ambulatory Visit (HOSPITAL_COMMUNITY): Payer: Self-pay | Admitting: Internal Medicine

## 2010-05-12 DIAGNOSIS — R52 Pain, unspecified: Secondary | ICD-10-CM

## 2010-05-12 DIAGNOSIS — N644 Mastodynia: Secondary | ICD-10-CM

## 2010-05-19 ENCOUNTER — Ambulatory Visit (HOSPITAL_COMMUNITY): Payer: Self-pay

## 2010-05-24 ENCOUNTER — Telehealth: Payer: Self-pay | Admitting: Orthopedic Surgery

## 2010-05-26 ENCOUNTER — Other Ambulatory Visit (HOSPITAL_COMMUNITY): Payer: Self-pay | Admitting: Internal Medicine

## 2010-05-26 ENCOUNTER — Ambulatory Visit (HOSPITAL_COMMUNITY)
Admission: RE | Admit: 2010-05-26 | Discharge: 2010-05-26 | Disposition: A | Discharge status: Home / Self Care | Payer: Self-pay | Source: Ambulatory Visit | Attending: Internal Medicine | Admitting: Internal Medicine

## 2010-05-26 ENCOUNTER — Ambulatory Visit (HOSPITAL_COMMUNITY): Admission: RE | Admit: 2010-05-26 | Payer: Self-pay | Source: Ambulatory Visit

## 2010-05-26 DIAGNOSIS — N644 Mastodynia: Secondary | ICD-10-CM | POA: Insufficient documentation

## 2010-05-26 DIAGNOSIS — R52 Pain, unspecified: Secondary | ICD-10-CM

## 2010-05-26 DIAGNOSIS — N63 Unspecified lump in unspecified breast: Secondary | ICD-10-CM | POA: Insufficient documentation

## 2010-06-01 NOTE — Progress Notes (Signed)
Summary: asked for one more refill until discount approved  Phone Note Call from Patient   Summary of Call: [name] said her hydrocodone was not approved for a refill, said she needs to be seen.  She said her [facility]  discount is in the process of being approved again and Lubertha Basque told her she is sure she will be approved.  Asked if you will approve just one more month until the discount comes through again.  Her number is [phone] Initial call taken by: Jacklynn Ganong,  May 24, 2010 12:15 PM  Follow-up for Phone Call        ok just ask for her to call pharmacy again to get rx request sent, I will fill one time until seen Follow-up by: Chasity Tereasa Coop,  May 24, 2010 12:32 PM  Additional Follow-up for Phone Call Additional follow up Details #1::        Patient advised Additional Follow-up by: Jacklynn Ganong,  May 24, 2010 1:38 PM

## 2010-06-08 LAB — CBC
HCT: 39.9 % (ref 36.0–46.0)
Hemoglobin: 14.2 g/dL (ref 12.0–15.0)
MCHC: 35.6 g/dL (ref 30.0–36.0)
MCV: 92.2 fL (ref 78.0–100.0)
Platelets: 274 10*3/uL (ref 150–400)
RBC: 4.32 MIL/uL (ref 3.87–5.11)
RDW: 13 % (ref 11.5–15.5)
WBC: 4.8 10*3/uL (ref 4.0–10.5)

## 2010-06-08 LAB — COMPREHENSIVE METABOLIC PANEL
ALT: 123 U/L — ABNORMAL HIGH (ref 0–35)
AST: 125 U/L — ABNORMAL HIGH (ref 0–37)
Albumin: 3.8 g/dL (ref 3.5–5.2)
Alkaline Phosphatase: 52 U/L (ref 39–117)
BUN: 12 mg/dL (ref 6–23)
CO2: 24 mEq/L (ref 19–32)
Calcium: 8.9 mg/dL (ref 8.4–10.5)
Chloride: 102 mEq/L (ref 96–112)
Creatinine, Ser: 0.79 mg/dL (ref 0.4–1.2)
GFR calc Af Amer: >60 mL/min (ref >60)
GFR calc non Af Amer: >60 mL/min (ref >60)
Glucose, Bld: 126 mg/dL — ABNORMAL HIGH (ref 70–99)
Potassium: 3.6 mEq/L (ref 3.5–5.1)
Sodium: 136 mEq/L (ref 135–145)
Total Bilirubin: 0.5 mg/dL (ref 0.3–1.2)
Total Protein: 7.5 g/dL (ref 6.0–8.3)

## 2010-06-08 LAB — GLUCOSE, CAPILLARY
Glucose-Capillary: 120 mg/dL — ABNORMAL HIGH (ref 70–99)
Glucose-Capillary: 128 mg/dL — ABNORMAL HIGH (ref 70–99)
Glucose-Capillary: 142 mg/dL — ABNORMAL HIGH (ref 70–99)
Glucose-Capillary: 150 mg/dL — ABNORMAL HIGH (ref 70–99)
Glucose-Capillary: 154 mg/dL — ABNORMAL HIGH (ref 70–99)
Glucose-Capillary: 155 mg/dL — ABNORMAL HIGH (ref 70–99)
Glucose-Capillary: 156 mg/dL — ABNORMAL HIGH (ref 70–99)
Glucose-Capillary: 173 mg/dL — ABNORMAL HIGH (ref 70–99)

## 2010-06-08 LAB — DIFFERENTIAL
Basophils Absolute: 0 10*3/uL (ref 0.0–0.1)
Basophils Relative: 0 % (ref 0–1)
Eosinophils Absolute: 0.1 10*3/uL (ref 0.0–0.7)
Eosinophils Relative: 2 % (ref 0–5)
Lymphocytes Relative: 43 % (ref 12–46)
Lymphs Abs: 2.1 10*3/uL (ref 0.7–4.0)
Monocytes Absolute: 0.5 10*3/uL (ref 0.1–1.0)
Monocytes Relative: 10 % (ref 3–12)
Neutro Abs: 2.1 10*3/uL (ref 1.7–7.7)
Neutrophils Relative %: 44 % (ref 43–77)

## 2010-06-08 LAB — AMYLASE: Amylase: 31 U/L (ref 0–105)

## 2010-06-26 LAB — GLUCOSE, CAPILLARY: Glucose-Capillary: 125 mg/dL — ABNORMAL HIGH (ref 70–99)

## 2010-06-26 LAB — CBC
HCT: 40.8 % (ref 36.0–46.0)
Hemoglobin: 14.3 g/dL (ref 12.0–15.0)
MCHC: 34.9 g/dL (ref 30.0–36.0)
MCV: 93 fL (ref 78.0–100.0)
Platelets: 383 10*3/uL (ref 150–400)
RBC: 4.39 MIL/uL (ref 3.87–5.11)
RDW: 12.8 % (ref 11.5–15.5)
WBC: 9.7 10*3/uL (ref 4.0–10.5)

## 2010-06-26 LAB — BASIC METABOLIC PANEL
BUN: 17 mg/dL (ref 6–23)
CO2: 24 mEq/L (ref 19–32)
Calcium: 10.1 mg/dL (ref 8.4–10.5)
Chloride: 102 mEq/L (ref 96–112)
Creatinine, Ser: 0.88 mg/dL (ref 0.4–1.2)
GFR calc Af Amer: >60 mL/min (ref >60)
GFR calc non Af Amer: >60 mL/min (ref >60)
Glucose, Bld: 99 mg/dL (ref 70–99)
Potassium: 4.2 mEq/L (ref 3.5–5.1)
Sodium: 138 mEq/L (ref 135–145)

## 2010-08-03 NOTE — H&P (Signed)
[name], [name]              ACCOUNT NO.:  [id number]   MEDICAL RECORD NO.:  [id number]          PATIENT TYPE:  INP   LOCATION:  A337                          FACILITY:  [facility]   PHYSICIAN:  Kingsley Callander. Ouida Sills, MD       DATE OF BIRTH:  November 10, 1960   DATE OF ADMISSION:  02/12/2008  DATE OF DISCHARGE:  LH                              HISTORY & PHYSICAL   CHIEF COMPLAINT:  Chest pain.   HISTORY OF PRESENT ILLNESS:  This patient is a 50 year old white female  who presented to the emergency room after awakening with left chest pain  radiating to her shoulder.  She originally experienced some shortness of  breath with it.  She went to work at the [employer].  She  developed more of a pressure-type feeling in her upper chest whereas her  initial symptoms had been more short.  She took antacids without relief.  She then took a sublingual nitroglycerin without complete relief.  She  did not vomit.  She presented to the emergency room where an EKG was  normal.  She had cardiac markers which were normal.  The emergency room  physician talked with the cardiologist who felt she needed to be  admitted and ruled out for an MI and evaluated further.  She had a  normal cardiac catheterization 3 years ago.  She has previously had a  normal nuclear stress test.  She does have diabetes.  She does not  smoke.  She has hyperlipidemia.  Her paternal grandmother and paternal  grandfather had MIs.  She denies any recent exertional pain, although  she thought her symptoms on the day of admission were somewhat  intensified by moving around.   PAST MEDICAL HISTORY:  1. Type 2 diabetes.  2. Hyperlipidemia.  3. Nonalcoholic fatty liver disease.  4. Migraine headaches.  5. Anxiety.  6. Hypothyroidism.  7. Hysterectomy.  8. Shoulder surgery.  9. Carpal tunnel release.  10.Tubal pregnancy.   MEDICATIONS:  1. Synthroid 100 mcg daily.  2. Metformin ER 1000 mg daily.  3. Glipizide XL 10 mg daily.  4.  Simvastatin 40 mg at bedtime.  5. Topamax 100 mg daily.  6. Lorazepam 1 mg one-half to one q.6 p.r.n.   ALLERGIES:  None.   SOCIAL HISTORY:  She does not smoke, drink, or use drugs.   FAMILY HISTORY:  Her mother has had hypertension and diabetes, and a  sister and a brother who have diabetes.   REVIEW OF SYSTEMS:  Noncontributory.   PHYSICAL EXAMINATION:  VITAL SIGNS:  Temperature 98.5, respirations 18,  pulse 84, blood pressure 127/77, oxygen saturation 99%.  GENERAL:  Overweight, alert, in no distress.  HEENT:  No scleral icterus.  Pharynx is unremarkable.  NECK:  Supple without JVD or thyromegaly.  LUNGS:  Clear.  HEART:  Regular with no murmurs.  CHEST:  Tender to palpation in the left upper chest and shoulder.  ABDOMEN:  Nontender.  No hepatosplenomegaly.  EXTREMITIES:  No calf tenderness.  No cyanosis, clubbing, or edema.  NEUROLOGIC:  No focal weakness.  LYMPH NODES:  No  cervical or supraclavicular enlargement.   LABORATORY DATA:  Troponin-I less than 0.05 twice, white count 9.5,  hemoglobin 14.3, platelets 363,000.  Sodium 138, potassium 3.6, glucose  125, creatinine 0.42, BUN 18.  D-dimer 0.28.  The chest x-ray reveals no  active disease.  EKG revealed normal sinus rhythm with no ischemic  changes.   IMPRESSION:  1. Chest pain.  Her symptoms are certainly concerning, but her initial      cardiac markers and EKG appear normal.  She will be hospitalized      for observation and serial cardiac enzymes and cardiology      consultation.  Her previous cardiac workup had been negative.  I      suspect her symptoms are musculoskeletal in etiology.  She has      previously been treated with IV heparin and IV nitroglycerin in the      ER.  Heparin will be continued for now, but IV nitroglycerin will      be discontinued.  2. Diabetes.  We will hold her oral agents and treat with sliding      scale NovoLog.  3. Hyperlipidemia.  4. Hypothyroidism.  5. Anxiety.  6.  Nonalcoholic fatty liver disease.  7. History of migraines.      Kingsley Callander. Ouida Sills, MD  Electronically Signed     ROF/MEDQ  D:  02/13/2008  T:  02/13/2008  Job:  [id number]

## 2010-08-03 NOTE — Op Note (Signed)
NAMECORBIN, HOTT              ACCOUNT NO.:  [id number]   MEDICAL RECORD NO.:  [id number]          PATIENT TYPE:  AMB   LOCATION:  DAY                           FACILITY:  [facility]   PHYSICIAN:  Vickki Hearing, M.D.DATE OF BIRTH:  1960/11/24   DATE OF PROCEDURE:  10/28/2008  DATE OF DISCHARGE:  10/28/2008                               OPERATIVE REPORT   PREOPERATIVE DIAGNOSIS:  Torn medial meniscus, right knee.   POSTOPERATIVE DIAGNOSIS:  Torn medial meniscus, right knee.   PROCEDURES:  1. Arthroscopy, right knee.  2. Partial medial meniscectomy.   SURGEON:  Vickki Hearing, MD   ASSISTANTS:  None.   ANESTHESIA:  General.   OPERATIVE FINDINGS:  Posterior horn root tear, medial meniscus; mild  arthritis, medial compartment, trochlea, patella; lateral compartment  normal, mild synovitis noted.   PROCEDURE:  [name] was identified in the preop area.  Her right  knee was marked as the surgical site and I countersigned it.  I reviewed  her history and physical and updated her chart.  She was taken to  surgery, given a gram of Ancef, given a general anesthetic.  Her right  leg was placed in a leg holder, left leg was padded and placed in a well  leg holder.  The right leg was prepped with chlorhexidine and draped  sterilely.  Time-out procedure was completed.   The patient had a second injury to her right knee and came in with  complaints of increased pain.  MRI showed medial meniscal tear, although  she tried to go without surgery.   Lateral portal was established.  Diagnostic arthroscopy was completed.  Medial portal was established.  Probe was inserted.  Diagnostic  arthroscopy was repeated.  Findings are noted above.   Combination of arthroscopic instruments were used to resect the tear and  balance the meniscus.  The knee was irrigated and closed with 3-0 nylon  and injected with 60 mL of Marcaine.  We applied a sterile dressing,  Cryo Cuff,  and she was  extubated and taken to recovery room in stable condition  with a followup scheduled for October 30, 2008.  PT to start on October 31, 2008.  Discharged with Lorcet Plus 1 q.4 p.r.n. for pain, #42 with 3  refills and Phenergan 25 mg 1 p.o. q.4, #30, no refills.      Vickki Hearing, M.D.  Electronically Signed     SEH/MEDQ  D:  10/29/2008  T:  10/29/2008  Job:  [id number]

## 2010-08-03 NOTE — Consult Note (Signed)
[name], [name]              ACCOUNT NO.:  [id number]   MEDICAL RECORD NO.:  [id number]          PATIENT TYPE:  INP   LOCATION:  A337                          FACILITY:  [facility]   PHYSICIAN:  Gerrit Friends. Dietrich Pates, MD, FACCDATE OF BIRTH:  07-15-1960   DATE OF CONSULTATION:  02/12/2008  DATE OF DISCHARGE:                                 CONSULTATION   CARDIOLOGIST:  She was previously followed by Dr. Nicki Guadalajara at  [facility].  She now has requested referral to  [facility].   PRIMARY CARE PHYSICIAN:  Kingsley Callander. Ouida Sills, M.D.   REASON FOR CONSULTATION:  Chest pain.   HISTORY OF PRESENT ILLNESS:  [name] is a 50 year old female patient  with a history of cardiac catheterization in March of 2006, done  secondary to chest discomfort.  Her cardiac catheterization with  essentially normal except for a minimal suggestion of very mild systolic  bridging during systole in the LAD.  Her EF was normal at 72%.  She was  treated medically at that time.  She has basically been pain free since  that time until this morning when she was awakened by substernal chest  pain that she describes as sharp.  She had some radiation to her left  shoulder and arm.  The pain got worse throughout the day and was worse  with exertion.  She notes a heaviness now.  She notes some shortness of  breath, as well as diaphoresis, but no nausea.  She denies any syncope.  She has noted some symptoms concerning for orthopnea recently.  She  denies paroxysmal nocturnal dyspnea.  She has noted some pedal edema.  She has noted a nonproductive cough.  She denies any fevers or chills.  She works at [employer] and was eventually given sublingual  nitroglycerin today with relief.  Her coworkers eventually convinced her  to come to the emergency room at [facility].  Her initial point  care markers have been negative.  Her initial EKG is unremarkable.  She  continues to have chest  heaviness in the room with just minimal  activity.  We are now asked to further evaluate.   PAST MEDICAL HISTORY:  1. Cardiac catheterization as outlined above.  2. Diabetes mellitus.  3. Hyperlipidemia.  4. Hypothyroidism.  5. Migraine headaches.  6. History of total abdominal hysterectomy.  7. Status post bilateral salpingo-oophorectomy secondary to ovarian      cyst.  8. Status post carpal tunnel surgery bilaterally.  9. Status post left rotator cuff surgery.   MEDICATIONS AT HOME:  1. Simvastatin 10 mg q.h.s.  2. Metformin 500 mg b.i.d.  3. Glipizide 10 mg daily.  4. Levothyroxine 0.15 mg daily.  5. Ativan p.r.n.  6. Topamax 100 mg daily.   ALLERGIES:  NO KNOWN DRUG ALLERGIES.   SOCIAL HISTORY:  The patient lives in [place].  She is unmarried.  She has one adult son.  She denies tobacco or alcohol abuse.  She works  at [employer] as a Associate Professor.   FAMILY HISTORY:  Insignificant for premature CAD.  Her  mother died of  congestive heart failure and a stroke.   REVIEW OF SYSTEMS:  Please see HPI.  She denies any fevers, chills,  dysuria, hematuria, bright red blood per rectum or melena, dysphagia,  odynophagia or GERD symptoms.  She notes a headache.  She denies rash.  She notes chronic dyspnea on exertion without change.  The rest of the  review of systems are negative.   PHYSICAL EXAMINATION:  GENERAL:  She is a well-nourished, well-developed  female in no acute distress.  VITAL SIGNS:  Blood pressure is 112/60, pulse 80, respirations 20,  temperature 98.5, oxygen saturation 98% on room air.  HEENT:  Normal.  NECK:  Without JVD.  LYMPH:  Without lymphadenopathy.  ENDOCRINE:  Without thyromegaly.  CARDIAC:  Normal S1 and S2.  Regular rate and rhythm without murmurs,  clicks, rubs or gallops.  LUNGS:  Clear to auscultation bilaterally without wheezing, rhonchi or  rales.  ABDOMEN:  Soft, nontender with no active bowel sounds.  No organomegaly.   EXTREMITIES:  Without clubbing or cyanosis.  She does have trace edema  bilaterally.  MUSCULOSKELETAL:  No joint deformity.  NEUROLOGIC:  She is alert and oriented x3.  Cranial nerves II-XII  grossly intact.  SKIN:  Without rash.   CHEST X-RAY:  No active disease.   ELECTROCARDIOGRAM:  Sinus rhythm with a heart rate of 89, normal axis,  no acute changes.   LABORATORY DATA:  White count 9500, hemoglobin 14.3, hematocrit 40.7,  platelet count 363,000, MCV 93.  Sodium 138, potassium 3.6, BUN 18,  creatinine 0.42, glucose 125.  Point care markers negative x3, D-dimer  0.28, INR 0.9.   ASSESSMENT:  1. Chest pain.  2. Normal coronaries by catheterization in 2006 with minimal      myocardial bridging in LAD.  3. Good left ventricular function.  4. Diabetes mellitus.  5. Hyperlipidemia.  6. Hypothyroidism.  7. Migraine headaches.   PLAN:  The patient was also interviewed and examined by Dr. Dietrich Pates.  Her symptoms are fairly impressive but somewhat atypical.  Her EKG is  negative and her markers are unremarkable.  Her exam is also  unremarkable.  She has significant cardiac risk factors, but the  likelihood of developing significant CAD in the past 3 years is low.  We  agree with observation overnight.  Cardiac markers should continue to be  cycled.  She should remain on aspirin and heparin.  Her other home  medication should be continued.  A BNP will be checked in addition to  her other labs.  We will plan GXT stress Myoview in the morning to  further assess her symptoms.   Thank you very much for the consultation.  We will be glad to follow the  patient throughout the remainder of this admission.      Tereso Newcomer, PA-C      Gerrit Friends. Dietrich Pates, MD, [facility]  Electronically Signed    SW/MEDQ  D:  02/12/2008  T:  02/12/2008  Job:  [id number]   cc:   Kingsley Callander. Ouida Sills, MD  Fax: [phone]

## 2010-08-04 ENCOUNTER — Encounter: Payer: Self-pay | Admitting: Orthopedic Surgery

## 2010-08-04 ENCOUNTER — Ambulatory Visit (INDEPENDENT_AMBULATORY_CARE_PROVIDER_SITE_OTHER): Payer: Self-pay | Admitting: Orthopedic Surgery

## 2010-08-04 DIAGNOSIS — M5430 Sciatica, unspecified side: Secondary | ICD-10-CM

## 2010-08-04 DIAGNOSIS — M25569 Pain in unspecified knee: Secondary | ICD-10-CM

## 2010-08-04 MED ORDER — PREDNISONE (PAK) 10 MG PO TABS: ORAL_TABLET | ORAL | Status: DC

## 2010-08-04 MED ORDER — HYDROCODONE-ACETAMINOPHEN 5-325 MG PO TABS: 1.0000 | ORAL_TABLET | ORAL | Status: DC | PRN

## 2010-08-04 MED ORDER — METHOCARBAMOL 500 MG PO TABS: 500.0000 mg | ORAL_TABLET | Freq: Three times a day (TID) | ORAL | Status: DC

## 2010-08-04 NOTE — Progress Notes (Signed)
Chief complaint LEFT knee pain  50 year old female with degenerative disc disease, back pain, with MRI documented diffuse bulging disc at L3 and 4 with mild to moderate facet disease at the same level with early and bilateral lateral recess stenosis  History of RIGHT knee arthroscopy did well presents with complaints of medial and lateral knee pain catching and giving way.  Past Medical History  Diagnosis Date  . Diabetes mellitus   . HTN (hypertension)   . Thyroid disease   . Elevated cholesterol   . Fibromyalgia   . Anxiety   . Depression    Past Surgical History  Procedure Date  . Carpal tunnel release bilateral  . Vaginal hysterectomy   . Shoulder surgery left  . Gallbladder   . Knee arthroscopy right    Exam she is a normal well-developed well-nourished female of large [place] who is ambulating with difficulty today complaining of lower extremity pain and some mild lower back symptoms.  She is oriented x3 her mood is pleasant.  Pulse and temperature of the LEFT lower extremity normal.  Hypersensitivity to touch over the fibular head and anterior compartment tracking down to the dorsum of the foot.  Negative straight leg raise at 60.  Medial joint line pain positive McMurray sign normal range of motion no joint effusion knee joint stable strength normal skin intact  Impression acute sciatica.  Possible medial meniscal tear LEFT knee.  Recommend steroid Dosepak to decrease the symptoms in her LEFT lower extremity related to radicular pain and then return in one week to discuss possible arthroscopy LEFT knee for medial meniscal tear

## 2010-08-06 NOTE — Discharge Summary (Signed)
   [name], [name]                        ACCOUNT NO.:  [id number]   MEDICAL RECORD NO.:  [id number]                   PATIENT TYPE:  INP   LOCATION:  A302                                 FACILITY:  [facility]   PHYSICIAN:  Kingsley Callander. Ouida Sills, M.D.                  DATE OF BIRTH:  10-Dec-1960   DATE OF ADMISSION:  05/29/2002  DATE OF DISCHARGE:                                 DISCHARGE SUMMARY   DISCHARGE DIAGNOSES:  1. Gastroenteritis.  2. Fatty liver.  3. Right ovarian cyst.   HOSPITAL COURSE:  This patient is a 50 year old white female who presented  with nausea, vomiting, diarrhea and abdominal pain without fever.  She had  experienced similar symptoms one week earlier and had been hospitalized and  found to have panniculitis by CT.  A CT scan was repeated.  There was no  evidence of abscess.  She did have a 2 cm right ovarian cyst.   She was seen by GI.  Antibiotics were stopped.  She had no further diarrhea  after admission.  She had nausea and right sided abdominal pain.  Her vital  signs remained normal.  She had mildly elevated transaminases felt to be  fatty liver.  Her Monospot was negative.  Her hepatitis C antibody was  negative.  Her hepatitis B surface antigen was negative.   She was treated empirically with Levsin SL and Protonix.   Her symptoms improved.  She was felt to be stable for discharge on 3/12.  She will follow up in the office in one week.   DISCHARGE MEDICATIONS:  1. Synthroid 100 mcg daily.  2.     Tylox q.4h. p.r.n.  3. Levsin SL A.C. and H.S.  4. Phenergan 25 mg q.4h. p.r.n.                                               Kingsley Callander. Ouida Sills, M.D.    ROF/MEDQ  D:  05/31/2002  T:  05/31/2002  Job:  [id number]   cc:   Tommye Standard, M.D.  [address] - Resident  [place], [place] [address]   Lazaro Arms, M.D.  [address], [address]  [place]  [place] [address]  Fax: [phone]

## 2010-08-06 NOTE — H&P (Signed)
NAMESHRON, OZER              ACCOUNT NO.:  [id number]   MEDICAL RECORD NO.:  [id number]          PATIENT TYPE:  OBV   LOCATION:  A208                          FACILITY:  [facility]   PHYSICIAN:  Angus G. Renard Matter, MD   DATE OF BIRTH:  1960-12-10   DATE OF ADMISSION:  05/13/2004  DATE OF DISCHARGE:  LH                                HISTORY & PHYSICAL   A 50 year old white female, patient of Dr. Ouida Sills, who works for the [employer], who began having anterior chest pain with radiation to the left  arm while at work.  Apparently, she had some nitroglycerin and took one and  apparently this did not relieve the symptoms quickly.  She describes this a  tightness and intermittent sharp pain left chest.  Chest x-ray showed no  acute disease.  The patient's lab work:  CBC:  WBC 10,800 with a hemoglobin  of 13.1, hematocrit 36.4.  Chemistry:  Sodium 134, potassium 3.5, chloride  100, CO2 26, glucose 121, BUN 15, creatinine 0.8.  Calcium 8.9.  Cardiac  markers:  Myoglobin 89.6.  CK MB 2.6.  Troponin less than 0.05.  It was felt  that the patient should be admitted for monitoring overnight.   SOCIAL HISTORY:  The patient does not smoke or drink alcohol.   FAMILY HISTORY:  Positive for MI, CVA.   PAST MEDICAL/SURGICAL HISTORY:  The patient had a hysterectomy in 1996,  ectopic pregnancy in 1992, left rotator cuff repaired, bilateral carpal  tunnel surgery.  The patient does have a history of previous pneumonia,  hypothyroidism,  hyperlipidemia.   The patient has no known allergies.   MEDICATIONS:  100 mcg of Synthroid daily.   REVIEW OF SYSTEMS:  HEENT:  Negative.  CARDIOPULMONARY:  The patient has  left anterior chest tightness, sharp pain left chest.  No cough.  GI:  No  bowel irregularity or bleeding.  GU:  No dysuria or hematuria.   EXAMINATION:  Alert female.  Blood pressure 124/70, heart rate 97,  respiration 20.  HEENT:  Eyes:  PERRLA.  TMs negative.  Oropharynx benign.  NECK:  Supple.  No JVD or thyroid abnormalities.  LUNGS:  Clear to P&A.  HEART:  Regular rhythm.  No murmurs.  ABDOMEN:  No palpable organs or masses.  SKIN:  Warm and dry.  EXTREMITIES:  Free of edema.  NEUROLOGIC:  No focal deficit.   DIAGNOSIS:  Acute left chest pain, rule out ischemic heart disease,  pulmonary embolism, etc.   DICTATION ENDED HERE.      AGM/MEDQ  D:  05/13/2004  T:  05/14/2004  Job:  [id number]

## 2010-08-06 NOTE — Discharge Summary (Signed)
[name], [name]              ACCOUNT NO.:  [id number]   MEDICAL RECORD NO.:  [id number]          PATIENT TYPE:  OBV   LOCATION:  A208                          FACILITY:  [facility]   PHYSICIAN:  Tesfaye D. Felecia Shelling, MD   DATE OF BIRTH:  05/30/1960   DATE OF ADMISSION:  DATE OF DISCHARGE:  02/25/2006LH                                 DISCHARGE SUMMARY   DISCHARGE DIAGNOSES:  1.  Noncardiac chest pain.  2.  Hypothyroidism.   DISCHARGE MEDICATIONS:  1.  Synthroid 100 microgram p.o. daily.  2.  Lortab 5/500 one tablet p.o. q.6h. p.r.n.   DISPOSITION:  The patient is discharged home in stable condition.   HOSPITAL COURSE:  This is a 50 year old female patient with a history of  hypothyroidism who was admitted on 05/13/04 due to left-sided chest pain.  She had serial EKG and cardiac enzymes which shows within the normal limits.  The patient was also seen by the cardiologist who ordered a CT scan of the  chest to rule out pulmonary embolism.  CT scan of the chest with contrast  became negative for pulmonary embolism.  The patient overall improved.  She  is pain-free.  She is going to be discharged to home to be followed as an  outpatient.      TDF/MEDQ  D:  05/15/2004  T:  05/16/2004  Job:  [id number]

## 2010-08-06 NOTE — Consult Note (Signed)
[name], [name]                        ACCOUNT NO.:  [id number]   MEDICAL RECORD NO.:  [id number]                   PATIENT TYPE:  INP   LOCATION:  A302                                 FACILITY:  [facility]   PHYSICIAN:  Lionel December, M.D.                 DATE OF BIRTH:  12/03/1960   DATE OF CONSULTATION:  05/29/2002  DATE OF DISCHARGE:                                   CONSULTATION   REASON FOR CONSULTATION:  Abdominal pain and diarrhea.   HISTORY OF PRESENT ILLNESS:  The patient is a 50 year old Caucasian female  who was admitted to Dr. Alonza Smoker service between 3/3 and 05/26/02, for acute  onset of nausea and vomiting, diarrhea, and fever.  At CT scan suggested  changes of panniculitis.  She was empirically treated with IV Unasyn.  She  improved and was discharged three days ago.  She noted postprandial nausea,  but did not have any vomiting.  She woke up this morning around 5:00 with  cramps across the lower abdomen radiating to the right side associated with  diarrhea.  She had five to six watery stools.  She noticed some yellowish-  reddish discoloration.  She also vomited once.  She came back to the  emergency room and was hospitalized.  The patient states that even though  she had not made a full recovery from her recent illness, that her diarrhea  is better.  Yesterday, she did not have any bowel movement.  She was  discharged on Augmentin.  During her last illness, she had headache which  lasted for a few days.  She had a fever which lasted for about 48 hours.  She did not have any skin rash or joint pains.  In addition to Augmentin,  she has been on Synthroid.   She had another abdominopelvic CT scan today which I reviewed with the  radiologist.  It shows fatty liver and prominent ovaries, but does not show  any changes to mesentry or thickening to her bowel.   REVIEW OF SYMPTOMS:  Negative for hematemesis, melena.  It is positive for a  six to eight pound weight loss  over the week or so.  She did not take any  antibiotics prior to last week.  She denies chronic heartburn or dysphagia.   PAST MEDICAL HISTORY:  1. Hypothyroidism was diagnosed about 17 years ago.  2. Laparotomy for a ruptured tubal pregnancy in 1982.  She is not sure if     she had transfusion.  3. Hysterectomy in 1996.  4. Bilateral decompression of her carpal tunnel.  5. History of fungal infection of her toenail.  She has been considered for     Lamisil therapy.  6. She has baseline liver function tests and these were mildly elevated two     months ago.  Dr. Ouida Sills has been planning to do a follow up on these.  She  did not receive Lamisil.   ALLERGIES:  No known drug allergies.   FAMILY HISTORY:  Father died at 17 of metastatic head and neck carcinoma.  Mother has been fair.  She is diabetic and has hypertension.  She has two  brothers and one sister.   SOCIAL HISTORY:  She is married.  She has two children.  She works at  [employer].  She does not smoke cigarettes or drink alcohol.   PHYSICAL EXAMINATION:  GENERAL:  A pleasant, obese, Caucasian female who is  in no acute distress.  VITAL SIGNS:  She weighs 248 pounds, she is 5 feet 8 inches tall.  Pulse is  84 per minute, blood pressure 128/67, respirations 20, temperature 97.1.  HEENT:  Conjunctivae is pink, sclerae is not icteric.  Oropharyngeal mucosa  is normal.  NECK:  Without masses or thyromegaly.  CARDIAC:  Regular rhythm, normal S1 and S2, no murmur or gallop noted.  LUNGS:  Clear to auscultation.  ABDOMEN:  Obese, bowel sounds are normal, palpation reveals a soft abdomen  with non-generalized tenderness which is somewhat more prominent in the  epigastric area.  Liver edge is 4 cm below the right costal margin and  minimally tender.  RECTAL:  A scant amount of liquid stool in the vault which is guaiac  negative.  EXTREMITIES:  She does not have clubbing, peripheral edema, or palmar  erythema.    LABORATORY DATA:  From this admission, white blood cell count is 11.4,  hemoglobin and hematocrit are 14.8 and 43.1, platelet count is 504.  Diff  reveals 61 neutrophils, 33 lymphs, 4 monos, 1 eosinophil, and 0 bands.  Sodium is 140, potassium 4.6, chloride 95, CO2 32, glucose 112, BUN 12,  creatinine 0.8.  Bilirubin 0.5, AP 56, AST 43, ALT 73, total protein 7.4,  albumin 4.  Calcium is 9.8.  Her white blood cell count from last admission  was 7.9.  Her AST was 36, and ALT was 48.   She also had ultrasound during last hospitalization which was negative for  cholelithiasis, showed fatty change.  Ultrasound suggested 5.9 cm distally  right ovary.  Old CT scans were reviewed.  I am not impressed with changes  of panniculitis.  She does not have any thickening to her bowel.   ASSESSMENT:  1. The patient present illness would appear to be most likely related to     antibiotic use.  She may have antibiotic induced colitis.  She does not     appear to be toxic, and she does not have leukocytosis or bandemia.     Therefore, I do not feel that empiric therapy has to be instituted.     Stool studies have been requested by Dr. Ouida Sills, and stool samples have     not yet been obtained.  2. Mildly elevated transaminases which are slightly more compared to one     week ago.  These are most likely due to fatty liver with bump due to     acute illness, however, need to make sure that she does not have a     chronic viral hepatitis, also need to rule out infectious mononucleosis.   RECOMMENDATIONS:  I would discontinue Unasyn as discussed with Dr. Ouida Sills.  She has been given Levsin q.h.s.  Stool studies as planned.  Hopefully diet  will be advanced in the a.m.  Would also do Hepatitis B surface antigen,  hepatitis C virus antibody, and Monospot test.   I would  like to thank Dr. Ouida Sills for the opportunity to participate in the  care of this nice lady.                                              Lionel December, M.D.    NR/MEDQ  D:  05/29/2002  T:  05/30/2002  Job:  [id number]

## 2010-08-06 NOTE — H&P (Signed)
[name], [name]                        ACCOUNT NO.:  [id number]   MEDICAL RECORD NO.:  [id number]                   PATIENT TYPE:  INP   LOCATION:  A312                                 FACILITY:  [facility]   PHYSICIAN:  Edward L. Juanetta Gosling, M.D.             DATE OF BIRTH:  08-19-1960   DATE OF ADMISSION:  05/22/2002  DATE OF DISCHARGE:                                HISTORY & PHYSICAL   INDICATIONS FOR ADMISSION:  Abdominal pain.   HISTORY OF PRESENT ILLNESS:  This is a 50 year old who had been in her usual  state of fairly good health and who developed, about 3 days ago an acute  episode of diarrhea which seemed to get somewhat better and then she  developed severe nausea, vomiting, and abdominal pain.  She eventually came  to the emergency room where she was noted to have relatively severe  abdominal discomfort.  She did have a fever and she underwent a number of  evaluations including laboratory work and a CT scan of the abdomen.  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  The pain is relatively severe; it is not a  cramp.  She also had been aching all over and had a feeling of a fever.   PAST MEDICAL HISTORY:  Her past medical history is positive for carpal  tunnel surgery, for a hysterectomy, and for hypothyroidism.   MEDICATIONS:  Her only medication is Synthroid.   SOCIAL HISTORY:  She does not smoke.  She does not drink any alcohol.   FAMILY HISTORY:  Her family history is positive for diverticulitis in her  father, but no other abdominal problems as far as she knows.   REVIEW OF SYSTEMS:  Her review of systems, except as mentioned, is negative.  No other members of the family have been sick.   ALLERGIES:  She has no known drug allergies.   PHYSICAL EXAMINATION:  VITAL SIGNS:  Physical exam shows that her  temperature initially 97.8, later 100.1; blood pressure 133/66; pulse 98;  respirations 18.  HEENT:  Shows that her pupils are equal, round, reacted to light and  accommodation.  Nose and throat are clear.  NECK:  Her neck is supple without masses.  CHEST:  Her chest is fairly clear without wheezes, rales or rhonchi.  HEART:  Heart is regular without murmurs, gallops, or rubs.  ABDOMEN:  Her abdomen is soft.  She has some right side tenderness in the  upper and lower quadrant of the abdomen.  No rebound.  EXTREMITIES:  Extremities showed edema.  CENTRAL NERVOUS SYSTEM:  Grossly intact.   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  She is febrile.  I suspect that this is probably  viral.   PLAN:  The plan is to have her take Unasyn 1.5 mg  IV q.6h., Tylenol,  Phenergan, and/or Zofran for nausea, morphine for pain, and then follow up  with Dr. Ouida Sills.                                               Edward L. Juanetta Gosling, M.D.    ELH/MEDQ  D:  05/22/2002  T:  05/22/2002  Job:  [id number]   cc:   Kingsley Callander. Ouida Sills, M.D.  [address]  [place]  [place] [address]  Fax: [phone]

## 2010-08-06 NOTE — H&P (Signed)
[name], [name]                        ACCOUNT NO.:  [id number]   MEDICAL RECORD NO.:  [id number]                   PATIENT TYPE:  INP   LOCATION:  A302                                 FACILITY:  [facility]   PHYSICIAN:  Kingsley Callander. Ouida Sills, M.D.                  DATE OF BIRTH:  1960-07-23   DATE OF ADMISSION:  05/29/2002  DATE OF DISCHARGE:                                HISTORY & PHYSICAL   CHIEF COMPLAINT:  Right lower quadrant pain.   HISTORY OF PRESENT ILLNESS:  This patient is a 50 year old white female who  presented with right lower quadrant pain, nausea, vomiting, and diarrhea.  She had been discharged from the hospital 3 days earlier after treatment of  a probable viral gastroenteritis with a CT finding of panniculitis. She had  been hospitalized from 03/03 to 03/07.  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  She had been  febrile then, but has not had recurrent fever.  She had felt much better,  was sent home on Sunday and then on Tuesday night began experiencing pain,  vomiting, and diarrhea again. She had been on Augmentin, as an outpatient.  She had also been found to have an enlarged ovary by CT.  Ultrasound though  had shown a normal size right ovary measuring 4.1 x 2.5 x 3.1 cm with a 1.9  cm exophytic cyst present.  She is status post hysterectomy.  She had an  ectopic pregnancy in 1982 and had a tube removed at that time.  She has not  had melena, rectal bleeding, or hematemesis.   PAST MEDICAL HISTORY:  1. Hypothyroidism.  2. Fatty liver with mild transaminase elevations.  3. Ectopic pregnancy in 1982 with tube removal.  4. Carpal tunnel release.   MEDICATIONS:  1. Augmentin 875 mg b.i.d.  2. Synthroid 100 mcg daily.  3. Phenergan p.r.n.  4. Lortab p.r.n.   ALLERGIES:  None.   SOCIAL HISTORY:  She does not drink alcohol or smoke  cigarettes.   FAMILY HISTORY:  Her mother has had hypertension and diabetes.  A sister has  had diabetes.   REVIEW OF SYSTEMS:  Noncontributory.   PHYSICAL EXAMINATION:  VITAL SIGNS:  Temperature 98.2, blood pressure  112/74, pulse 76.  Weight 251.  GENERAL:  Overweight white female who appears uncomfortable.  HEENT:  No scleral icterus. The pharynx is moist and without oral lesions.  NECK:  The neck is supple without thyromegaly.  LUNGS:  Clear.  HEART:  Regular with no murmurs.  ABDOMEN:  Tender in the right lower quadrant with no palpable organomegaly  or mass.  EXTREMITIES:  No clubbing, cyanosis, or edema.  NEUROLOGIC:  Grossly intact.   LABORATORY DATA:  White count 11.4, hemoglobin 14.8, platelets 504.  SGOT  43, SGPT 73, potassium 4.6, glucose 112, BUN 13, creatinine 0.8, lipase 32.  Urinalysis is negative.   IMPRESSION:  1. Recurrent abdominal pain with the recent diagnosis of panniculitis with     recurrent diarrhea and abdominal symptoms.  She may have developed an     antibiotic associated colitis.  Will repeat a CT scan of the abdomen to     readdress this issue of panniculitis.  Will consult the     gastroenterologist.  2. Ovarian cyst.  3. Fatty liver.  4. Hypothyroidism.                                               Kingsley Callander. Ouida Sills, M.D.    ROF/MEDQ  D:  05/30/2002  T:  05/30/2002  Job:  [id number]

## 2010-08-06 NOTE — Discharge Summary (Signed)
[name], [name]              ACCOUNT NO.:  [id number]   MEDICAL RECORD NO.:  [id number]         PATIENT TYPE:  PINP   LOCATION:  A337                          FACILITY:  [facility]   PHYSICIAN:  Kingsley Callander. Ouida Sills, MD       DATE OF BIRTH:  [birth year]   DATE OF ADMISSION:  02/12/2008  DATE OF DISCHARGE:  11/25/2009LH                               DISCHARGE SUMMARY   DISCHARGE DIAGNOSES:  1. Chest pain, myocardial infarction ruled out.  2. Diabetes.  3. Hyperlipidemia.  4. Nonalcoholic fatty liver disease.  5. Migraine headaches.  6. Hypothyroidism.  7. Anxiety.   HOSPITAL COURSE:  This patient is a 50 year old female who presented  with chest pain radiating to her left shoulder.  She was evaluated in  the emergency room where cardiac markers were negative.  She was  hospitalized for observation.  Serial cardiac enzymes were negative.  She was seen in consultation by cardiology      and underwent a Myoview  stress test which was negative.  Her pain was felt to possibly be  musculoskeletal.  Her D-dimer was 0.28.  Her chest x-ray revealed no  active disease.  The EKG revealed no definite ischemic changes.   She had previously had normal coronary arteries by cardiac  catheterization in 2006.  She had minimal myocardial breathing seen in  the LAD and normal LV function.   Diabetes is satisfactorily controlled.   She was pain free and stable for discharge after her stress test.  She  will be continued on her usual home medications with the addition of  ibuprofen 2 t.i.d. for her probable musculoskeletal chest pain.   DISCHARGE MEDICATIONS:  1. Synthroid 100 mcg daily.  2. Metformin ER 1000 mg daily.  3. Glipizide XL 10 mg daily.  4. Simvastatin 40 mg nightly.  5. Topamax 100 mg daily.  6. Lorazepam 1 mg one half to one q.6 p.r.n.   FOLLOWUP:  The patient will be seen in follow up in 2 weeks.      Kingsley Callander. Ouida Sills, MD  Electronically Signed     ROF/MEDQ  D:  02/25/2008  T:   02/25/2008  Job:  [id number]

## 2010-08-06 NOTE — H&P (Signed)
NAMEKARN, DERK              ACCOUNT NO.:  [id number]   MEDICAL RECORD NO.:  [id number]          PATIENT TYPE:  OBV   LOCATION:  A208                          FACILITY:  [facility]   PHYSICIAN:  Angus G. Renard Matter, MD   DATE OF BIRTH:  [birth year]   DATE OF ADMISSION:  05/13/2004  DATE OF DISCHARGE:  LH                                HISTORY & PHYSICAL   ADDENDUM:  Electrocardiogram:  Normal sinus rhythm with a rate of 97.  Normal sinus  rhythm, no acute change.      AGM/MEDQ  D:  05/13/2004  T:  05/14/2004  Job:  [id number]

## 2010-08-06 NOTE — Discharge Summary (Signed)
   NAMELAYLANI, [name]                        ACCOUNT NO.:  [id number]   MEDICAL RECORD NO.:  [id number]                   PATIENT TYPE:  INP   LOCATION:  A312                                 FACILITY:  [facility]   PHYSICIAN:  Kingsley Callander. Ouida Sills, M.D.                  DATE OF BIRTH:  09/19/1960   DATE OF ADMISSION:  05/22/2002  DATE OF DISCHARGE:  05/26/2002                                 DISCHARGE SUMMARY   DISCHARGE DIAGNOSES:  1. Panniculitis.  2. Hypothyroidism.  3. Fatty liver.  4. A 1.9 cm right ovarian cyst.   HOSPITAL COURSE:  This patient is a 50 year old female who presented with  nausea, vomiting, diarrhea, and abdominal pain.  She was febrile to 102.8.  Her white count was 7.9.  She had an elevated ALT of 48 with a normal AST,  bilirubin, and lipase.  A urinalysis was negative.  A CT scan of the abdomen  and  pelvis was obtained in the emergency room.  Haziness of fat planes was  seen in the mesenteric region surrounding the superior mesenteric vessels.  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  Fatty liver changes  were also present.  The appendix showed no evidence of inflammation.  The  pelvic CT revealed a complex right ovary which was felt to be enlarged at 6  x 2.5 cm.  An ultrasound was obtained which revealed a normal sized right  ovary measuring 4.1 x 2.5 x 3.1 cm.  There was a small exophytic cyst  measuring 1.9 cm.  She is status post hysterectomy.   She was treated with IV Unasyn, Zofran, and morphine.  Her pain improved.  Her vomiting and diarrhea ultimately resolved and her diet was advanced.  A  repeat white count was 7.8.   She experienced left-sided headache and generalized weakness.  She gradually  improved and was able to take satisfactory liquids by mouth and bland foods  and was felt to be stable for discharge on 03/07.  She will continue  antibiotic therapy with Augmentin as an outpatient.  She will follow up in  the office in 2 weeks.   DISCHARGE MEDICATIONS:  1. Augmentin 875 mg b.i.d. for 6 more days.  2.     Synthroid 100 mcg daily.  3. Phenergan 25 mg q.4h. p.r.n.  4. Lortab 5 mg q.4h. p.r.n.                                               Kingsley Callander. Ouida Sills, M.D.    ROF/MEDQ  D:  05/26/2002  T:  05/27/2002  Job:  [id number]   cc:   Lazaro Arms, M.D.  [address], [address]  [place]  [place] [address]  Fax: [phone]

## 2010-08-12 ENCOUNTER — Encounter: Payer: Self-pay | Admitting: Orthopedic Surgery

## 2010-08-12 ENCOUNTER — Ambulatory Visit (INDEPENDENT_AMBULATORY_CARE_PROVIDER_SITE_OTHER): Payer: Medicaid Other | Admitting: Orthopedic Surgery

## 2010-08-12 DIAGNOSIS — M23329 Other meniscus derangements, posterior horn of medial meniscus, unspecified knee: Secondary | ICD-10-CM | POA: Insufficient documentation

## 2010-08-12 NOTE — Progress Notes (Signed)
LEFT knee pain.  This is a 50 year old female, who has 7/10. Constant medial knee pain associated with mechanical symptoms and knee swelling, who had some sciatica associated last week, which was treated with prednisone Dosepak and has improved in terms of the radicular pain, but has persistent medial knee pain.  The patient denies anorexia, fever, weight loss,, vision loss, decreased hearing, hoarseness, chest pain, syncope, dyspnea on exertion, peripheral edema, balance deficits, hemoptysis, abdominal pain, melena, hematochezia, severe indigestion/heartburn, hematuria, incontinence, genital sores, muscle weakness, suspicious skin lesions, transient blindness, difficulty walking, depression, unusual weight change, abnormal bleeding, enlarged lymph nodes, angioedema, and breast masses.  Family History  Problem Relation Age of Onset  . Cancer    . Diabetes    . Heart disease    . Arthritis    . Lung disease     Past Medical History  Diagnosis Date  . Diabetes mellitus   . HTN (hypertension)   . Thyroid disease   . Elevated cholesterol   . Fibromyalgia   . Anxiety   . Depression    Past Surgical History  Procedure Date  . Carpal tunnel release bilateral  . Vaginal hysterectomy   . Shoulder surgery left  . Gallbladder   . Knee arthroscopy right   History   Social History  . Marital Status: Divorced    Spouse Name: N/A    Number of Children: N/A  . Years of Education: N/A   Occupational History  . unemployed    Social History Main Topics  . Smoking status: Never Smoker   . Smokeless tobacco: Not on file  . Alcohol Use: No  . Drug Use: Not on file  . Sexually Active: Not on file   Other Topics Concern  . Not on file   Social History Narrative  . No narrative on file   Gen. Appearance, no deformity.  Mental orientation, alert, and oriented mood and affect. Normal.  Ambulation: Favors the LEFT leg.  Upper extremity exam  Inspection and palpation revealed no  abnormalities in the upper extremities.  Range of motion is full without contracture.  Motor exam is normal with grade 5 strength.  The joints are fully reduced without subluxation.  There is no atrophy or tremor and muscle tone is normal.  All joints are stable.   LEFT knee medial joint line tenderness with joint swelling and effusion, positive McMurray sign, knee flexion equals 125, mediolateral stability test were normal, anteroposterior stability test were normal. Muscle tone normal. Muscle strength manual muscle testing grade 5.  Impression medial meniscal tear.  Plan arthroscopy, LEFT knee, partial medial meniscectomy.  This procedure has been fully reviewed with the patient and  informed consent has been obtained.

## 2010-08-17 ENCOUNTER — Other Ambulatory Visit: Payer: Self-pay | Admitting: Orthopedic Surgery

## 2010-08-17 ENCOUNTER — Other Ambulatory Visit: Payer: Self-pay | Admitting: *Deleted

## 2010-08-17 ENCOUNTER — Encounter (HOSPITAL_COMMUNITY): Payer: Medicaid Other

## 2010-08-17 DIAGNOSIS — M25569 Pain in unspecified knee: Secondary | ICD-10-CM

## 2010-08-17 LAB — BASIC METABOLIC PANEL
BUN: 21 mg/dL (ref 6–23)
CO2: 25 mEq/L (ref 19–32)
Calcium: 10.4 mg/dL (ref 8.4–10.5)
Chloride: 103 mEq/L (ref 96–112)
Creatinine, Ser: 0.89 mg/dL (ref 0.4–1.2)
GFR calc Af Amer: >60 mL/min (ref >60)
GFR calc non Af Amer: >60 mL/min (ref >60)
Glucose, Bld: 124 mg/dL — ABNORMAL HIGH (ref 70–99)
Potassium: 4 mEq/L (ref 3.5–5.1)
Sodium: 139 mEq/L (ref 135–145)

## 2010-08-17 LAB — HEMOGLOBIN AND HEMATOCRIT, BLOOD
HCT: 42.1 % (ref 36.0–46.0)
Hemoglobin: 14.2 g/dL (ref 12.0–15.0)

## 2010-08-17 MED ORDER — HYDROCODONE-ACETAMINOPHEN 5-325 MG PO TABS: 1.0000 | ORAL_TABLET | ORAL | Status: DC | PRN

## 2010-08-20 ENCOUNTER — Ambulatory Visit (HOSPITAL_COMMUNITY)
Admission: RE | Admit: 2010-08-20 | Discharge: 2010-08-20 | Disposition: A | Discharge status: Home / Self Care | Payer: Medicaid Other | Source: Ambulatory Visit | Attending: Orthopedic Surgery | Admitting: Orthopedic Surgery

## 2010-08-20 ENCOUNTER — Telehealth: Payer: Self-pay | Admitting: Orthopedic Surgery

## 2010-08-20 DIAGNOSIS — IMO0002 Reserved for concepts with insufficient information to code with codable children: Secondary | ICD-10-CM | POA: Insufficient documentation

## 2010-08-20 DIAGNOSIS — Z79899 Other long term (current) drug therapy: Secondary | ICD-10-CM | POA: Insufficient documentation

## 2010-08-20 DIAGNOSIS — M1710 Unilateral primary osteoarthritis, unspecified knee: Secondary | ICD-10-CM | POA: Insufficient documentation

## 2010-08-20 DIAGNOSIS — E119 Type 2 diabetes mellitus without complications: Secondary | ICD-10-CM | POA: Insufficient documentation

## 2010-08-20 DIAGNOSIS — I10 Essential (primary) hypertension: Secondary | ICD-10-CM | POA: Insufficient documentation

## 2010-08-20 LAB — SURGICAL PCR SCREEN
MRSA, PCR: NEGATIVE
Staphylococcus aureus: NEGATIVE

## 2010-08-20 LAB — GLUCOSE, CAPILLARY: Glucose-Capillary: 105 mg/dL — ABNORMAL HIGH (ref 70–99)

## 2010-08-20 NOTE — Telephone Encounter (Signed)
Call to insurer on 08/17/10, 6:29pm - Per [employer] pre-authorization automated message system, no pre-authorization is required for out-patient surgery. CPT [phone], scheduled 08/20/10, [facility].

## 2010-08-23 ENCOUNTER — Ambulatory Visit (INDEPENDENT_AMBULATORY_CARE_PROVIDER_SITE_OTHER): Payer: Medicaid Other | Admitting: Orthopedic Surgery

## 2010-08-23 DIAGNOSIS — M1710 Unilateral primary osteoarthritis, unspecified knee: Secondary | ICD-10-CM

## 2010-08-23 NOTE — Progress Notes (Signed)
Postoperative visit #1  Postop day #3  Arthroscopy LEFT knee minimal debridement for arthritis  Patient doing well  Sutures were removed today  She can start physical therapy wean herself from the walker continue pain medications as needed

## 2010-08-23 NOTE — Patient Instructions (Signed)
Start PT call # on order sheet  Ok to shower

## 2010-08-26 NOTE — Op Note (Signed)
  [name], [name]              ACCOUNT NO.:  [id number]  MEDICAL RECORD NO.:  [id number]  LOCATION:  DAYP                          FACILITY:  [facility]  PHYSICIAN:  Vickki Hearing, M.D.DATE OF BIRTH:  [birth year]  DATE OF PROCEDURE:  08/20/2010 DATE OF DISCHARGE:  08/20/2010                              OPERATIVE REPORT   SURGEON:  Vickki Hearing, MD  PREOPERATIVE DIAGNOSIS:  Left knee medial meniscal tear.  POSTOPERATIVE DIAGNOSIS:  Arthritis, left knee.  PROCEDURE:  Arthroscopy, left knee.  HISTORY:  A 50 year old female presented with 7/10 constant medial knee pain with mechanical symptoms and swelling who had had a previous right knee partial meniscectomy and said that her symptoms were the same.  She had tenderness over the medial joint line and physical findings consistent with torn meniscus.  She was offered nonoperative versus operative treatment.  She chose operative treatment.  She had general anesthesia.  OPERATIVE FINDINGS:  Medial and patellofemoral arthritis, moderate medial plica.  PROCEDURE:  Arthroscopy, left knee, limited debridement.  DETAILS OF PROCEDURE:  After site marking and chart update, the patient was brought to the surgical suite for general anesthesia.  She was in the supine position and her left leg was placed in an arthroscopic leg holder and right leg in a well leg holder.  After time-out was completed, lateral portal was established with an 11- blade.  The scope was placed through the lateral portal into the medial compartment and the diagnostic arthroscopy was completed.  We noted medial compartment gonarthrosis moderate, we noted similar grade 2 changes in the trochlea.  The lateral compartment was normal, meniscus was normal, lateral and medial and the cruciate ligaments were normal. There was synovitis in the joint.  Through a medial portal, a probe was placed and the meniscus was probed and it was stable without  tearing.  The cartilaginous defects were noted.  The medial plica was resected.  The knee was irrigated and closed with 3-0 nylon sutures followed by injection of 30 mL of Marcaine with epinephrine.  The patient was extubated, taken to the recovery room in stable condition.     Vickki Hearing, M.D.     SEH/MEDQ  D:  08/23/2010  T:  08/24/2010  Job:  [id number]  Electronically Signed by Fuller Canada M.D. on 08/26/2010 07:59:06 AM

## 2010-08-30 ENCOUNTER — Ambulatory Visit (INDEPENDENT_AMBULATORY_CARE_PROVIDER_SITE_OTHER): Payer: Medicaid Other | Admitting: Orthopedic Surgery

## 2010-08-30 DIAGNOSIS — Z9889 Other specified postprocedural states: Secondary | ICD-10-CM

## 2010-08-30 MED ORDER — IBUPROFEN 800 MG PO TABS: 800.0000 mg | ORAL_TABLET | Freq: Three times a day (TID) | ORAL | Status: AC | PRN

## 2010-08-30 NOTE — Progress Notes (Signed)
POD 10   SALK, OA SEEN NO TEAR   UNSCHEDULED VISIT: SWELLING   KNEE SMALL EFFUSION  ANKLE FOOT EDEMA, CALF NON TENDER, NEG HOMANS SIGN   ICE ELEVATION AND ANKLE PUMPS   CONTINUE PT   CALL IF ANY PROBLEM DEVELOPS

## 2010-09-01 ENCOUNTER — Ambulatory Visit (HOSPITAL_COMMUNITY)
Admission: RE | Admit: 2010-09-01 | Discharge: 2010-09-01 | Disposition: A | Discharge status: Home / Self Care | Payer: Medicaid Other | Source: Ambulatory Visit | Attending: Orthopedic Surgery | Admitting: Orthopedic Surgery

## 2010-09-01 DIAGNOSIS — M25669 Stiffness of unspecified knee, not elsewhere classified: Secondary | ICD-10-CM | POA: Insufficient documentation

## 2010-09-01 DIAGNOSIS — R262 Difficulty in walking, not elsewhere classified: Secondary | ICD-10-CM | POA: Insufficient documentation

## 2010-09-01 DIAGNOSIS — M25569 Pain in unspecified knee: Secondary | ICD-10-CM | POA: Insufficient documentation

## 2010-09-01 DIAGNOSIS — M6281 Muscle weakness (generalized): Secondary | ICD-10-CM | POA: Insufficient documentation

## 2010-09-01 DIAGNOSIS — IMO0001 Reserved for inherently not codable concepts without codable children: Secondary | ICD-10-CM | POA: Insufficient documentation

## 2010-09-07 ENCOUNTER — Ambulatory Visit (HOSPITAL_COMMUNITY)
Admission: RE | Admit: 2010-09-07 | Discharge: 2010-09-07 | Disposition: A | Discharge status: Home / Self Care | Payer: Medicaid Other | Source: Ambulatory Visit | Attending: Internal Medicine | Admitting: Internal Medicine

## 2010-09-09 ENCOUNTER — Telehealth: Payer: Self-pay | Admitting: Orthopedic Surgery

## 2010-09-09 NOTE — Telephone Encounter (Signed)
[place] want to know if she can get in the pool to swim, or if not, when will she be able to.  [phone]

## 2010-09-09 NOTE — Telephone Encounter (Signed)
Left a message for the patient to call our office.

## 2010-09-09 NOTE — Telephone Encounter (Signed)
Swim ok 

## 2010-09-14 ENCOUNTER — Ambulatory Visit (HOSPITAL_COMMUNITY): Payer: Medicaid Other | Admitting: Physical Therapy

## 2010-09-15 ENCOUNTER — Ambulatory Visit (INDEPENDENT_AMBULATORY_CARE_PROVIDER_SITE_OTHER): Payer: Medicaid Other | Admitting: Orthopedic Surgery

## 2010-09-15 DIAGNOSIS — M25569 Pain in unspecified knee: Secondary | ICD-10-CM

## 2010-09-15 DIAGNOSIS — M5136 Other intervertebral disc degeneration, lumbar region: Secondary | ICD-10-CM

## 2010-09-15 DIAGNOSIS — M5137 Other intervertebral disc degeneration, lumbosacral region: Secondary | ICD-10-CM

## 2010-09-15 MED ORDER — PREGABALIN 50 MG PO CAPS: 50.0000 mg | ORAL_CAPSULE | Freq: Three times a day (TID) | ORAL | Status: DC

## 2010-09-15 MED ORDER — HYDROCODONE-ACETAMINOPHEN 5-325 MG PO TABS: 1.0000 | ORAL_TABLET | ORAL | Status: DC | PRN

## 2010-09-16 NOTE — Progress Notes (Signed)
   Postoperative visit.  Surgery date June 1 90 day.  September 1 approximately  Status post LEFT knee arthroscopy we found arthritis no tears.  Today complaining of radicular pain LEFT leg.  I reviewed her previous MRI from 2011 shows she has degenerative disc disease.  Recommend increase Lyrica 50 mg t.i.d.  LEFT knee range of motion improving.  Swelling diminished.  No effusion.  No pain no tenderness.  Knee is stable.  Flexion 120.  Full extension.  Impression recovering from LEFT knee arthroscopy diagnosis arthritis Continuing pain radicular in nature LEFT leg.  MRI shows noncompressive degenerative disc disease.  Continue nonoperative treatment followup in 6 weeks

## 2010-09-21 ENCOUNTER — Ambulatory Visit (HOSPITAL_COMMUNITY): Payer: Medicaid Other | Admitting: Physical Therapy

## 2010-09-29 ENCOUNTER — Ambulatory Visit (HOSPITAL_COMMUNITY): Payer: Medicaid Other | Admitting: Physical Therapy

## 2010-10-28 ENCOUNTER — Ambulatory Visit (INDEPENDENT_AMBULATORY_CARE_PROVIDER_SITE_OTHER): Payer: Medicaid Other | Admitting: Orthopedic Surgery

## 2010-10-28 DIAGNOSIS — M4800 Spinal stenosis, site unspecified: Secondary | ICD-10-CM | POA: Insufficient documentation

## 2010-10-28 DIAGNOSIS — M25569 Pain in unspecified knee: Secondary | ICD-10-CM

## 2010-10-28 MED ORDER — HYDROCODONE-ACETAMINOPHEN 5-325 MG PO TABS: 1.0000 | ORAL_TABLET | ORAL | Status: DC | PRN

## 2010-10-28 NOTE — Patient Instructions (Addendum)
Continue current medications  You will be scheduled for esi at L3-4 based on the MRI from 2011, preferred Dr Len Childs will return after the 3 rd injection

## 2010-10-28 NOTE — Progress Notes (Signed)
Postoperative followup visit status post arthroscopy LEFT knee where we found primarily arthritis of the LEFT knee.  Surgery was on June 14 of this year  The patient continues to have LEFT lower extremity radicular leg pain she had an MRI last year and it showed the following:  L3-4: Mild disc desiccation along with a diffuse bulging annulus  in combination with mild to moderate facet disease creates early  spinal and bilateral lateral recess stenosis. No foraminal  Stenosis.  She is currently on hydrocodone 5 mg, Lyrica 50 mg and ibuprofen 800 mg.  Primarily having LEFT back this pain and LEFT gluteal pain radiating to the LEFT knee.  She has had adequate nonoperative care.  Continue with epidural injection.  Today we have given the patient  [employer] patient information Related to the disease process

## 2010-10-29 ENCOUNTER — Telehealth: Payer: Self-pay | Admitting: Radiology

## 2010-10-29 NOTE — Telephone Encounter (Signed)
I faxed a referral for this patient to Dr. Eduard Clos to have ESI series at level L3-4.

## 2010-11-15 ENCOUNTER — Telehealth: Payer: Self-pay | Admitting: Orthopedic Surgery

## 2010-11-15 NOTE — Telephone Encounter (Signed)
Received call from [facility] at Dr. Ozzie Hoyle office; notified us that referral appointment for patient is today, 11/15/10.

## 2010-11-23 ENCOUNTER — Other Ambulatory Visit: Payer: Self-pay | Admitting: *Deleted

## 2010-11-23 DIAGNOSIS — M25569 Pain in unspecified knee: Secondary | ICD-10-CM

## 2010-11-23 MED ORDER — METHOCARBAMOL 500 MG PO TABS: 500.0000 mg | ORAL_TABLET | Freq: Four times a day (QID) | ORAL | Status: AC

## 2010-11-23 MED ORDER — METHOCARBAMOL 500 MG PO TABS: 500.0000 mg | ORAL_TABLET | Freq: Three times a day (TID) | ORAL | Status: DC

## 2010-12-09 LAB — COMPREHENSIVE METABOLIC PANEL
ALT: 84 — ABNORMAL HIGH
AST: 74 — ABNORMAL HIGH
Albumin: 3.8
Alkaline Phosphatase: 67
BUN: 15
CO2: 24
Calcium: 9.6
Chloride: 97
Creatinine, Ser: 0.93
GFR calc Af Amer: >60
GFR calc non Af Amer: >60
Glucose, Bld: 287 — ABNORMAL HIGH
Potassium: 4.6
Sodium: 134 — ABNORMAL LOW
Total Bilirubin: 0.5
Total Protein: 7.4

## 2010-12-09 LAB — CBC
HCT: 43.3
Hemoglobin: 14.8
MCHC: 34.1
MCV: 90.8
Platelets: 335
RBC: 4.78
RDW: 12.4
WBC: 8.1

## 2010-12-09 LAB — DIFFERENTIAL
Basophils Absolute: 0.1
Basophils Relative: 1
Eosinophils Absolute: 0.1
Eosinophils Relative: 1
Lymphocytes Relative: 31
Lymphs Abs: 2.5
Monocytes Absolute: 0.5
Monocytes Relative: 6
Neutro Abs: 4.9
Neutrophils Relative %: 61

## 2010-12-09 LAB — URINALYSIS, ROUTINE W REFLEX MICROSCOPIC
Bilirubin Urine: NEGATIVE
Glucose, UA: 100 — AB
Leukocytes, UA: NEGATIVE
Nitrite: NEGATIVE
Protein, ur: 100 — AB
Specific Gravity, Urine: >1.03 — ABNORMAL HIGH
Urobilinogen, UA: 0.2
pH: 5.5

## 2010-12-09 LAB — AMYLASE: Amylase: 30

## 2010-12-09 LAB — URINE MICROSCOPIC-ADD ON

## 2010-12-09 LAB — LIPASE, BLOOD: Lipase: 25

## 2010-12-21 LAB — POCT CARDIAC MARKERS
CKMB, poc: 4.3
CKMB, poc: 4.3
CKMB, poc: 5.3
Myoglobin, poc: 112
Myoglobin, poc: 116
Myoglobin, poc: 153
Troponin i, poc: <0.05
Troponin i, poc: <0.05
Troponin i, poc: <0.05

## 2010-12-21 LAB — CARDIAC PANEL(CRET KIN+CKTOT+MB+TROPI)
CK, MB: 4
CK, MB: 4.3 — ABNORMAL HIGH
CK, MB: 4.8 — ABNORMAL HIGH
Relative Index: 2.7 — ABNORMAL HIGH
Relative Index: 3.3 — ABNORMAL HIGH
Relative Index: 3.4 — ABNORMAL HIGH
Total CK: 131
Total CK: 141
Total CK: 148
Troponin I: 0.01
Troponin I: 0.01
Troponin I: <0.01

## 2010-12-21 LAB — DIFFERENTIAL
Basophils Absolute: 0
Basophils Absolute: 0.3 — ABNORMAL HIGH
Basophils Relative: 1
Basophils Relative: 3 — ABNORMAL HIGH
Eosinophils Absolute: 0.1
Eosinophils Absolute: 0.2
Eosinophils Relative: 1
Eosinophils Relative: 3
Lymphocytes Relative: 34
Lymphocytes Relative: 42
Lymphs Abs: 3.2
Lymphs Abs: 3.2
Monocytes Absolute: 0.5
Monocytes Absolute: 0.6
Monocytes Relative: 6
Monocytes Relative: 7
Neutro Abs: 3.6
Neutro Abs: 5.3
Neutrophils Relative %: 47
Neutrophils Relative %: 56

## 2010-12-21 LAB — CBC
HCT: 37.9
HCT: 40.7
Hemoglobin: 13.1
Hemoglobin: 14.3
MCHC: 34.5
MCHC: 35.1
MCV: 93.3
MCV: 93.9
Platelets: 298
Platelets: 363
RBC: 4.03
RBC: 4.36
RDW: 12.5
RDW: 12.6
WBC: 7.6
WBC: 9.5

## 2010-12-21 LAB — BASIC METABOLIC PANEL
BUN: 18
CO2: 25
Calcium: 9.7
Chloride: 104
Creatinine, Ser: 0.42
GFR calc Af Amer: >60
GFR calc non Af Amer: >60
Glucose, Bld: 125 — ABNORMAL HIGH
Potassium: 3.6
Sodium: 138

## 2010-12-21 LAB — GLUCOSE, CAPILLARY
Glucose-Capillary: 124 — ABNORMAL HIGH
Glucose-Capillary: 132 — ABNORMAL HIGH
Glucose-Capillary: 154 — ABNORMAL HIGH
Glucose-Capillary: 155 — ABNORMAL HIGH
Glucose-Capillary: 159 — ABNORMAL HIGH

## 2010-12-21 LAB — PROTIME-INR
INR: 0.9
Prothrombin Time: 12.6

## 2010-12-21 LAB — HEPARIN LEVEL (UNFRACTIONATED)
Heparin Unfractionated: 0.89 — ABNORMAL HIGH
Heparin Unfractionated: 0.93 — ABNORMAL HIGH

## 2010-12-21 LAB — D-DIMER, QUANTITATIVE (NOT AT ARMC): D-Dimer, Quant: 0.28

## 2010-12-21 LAB — APTT: aPTT: 30

## 2011-01-06 ENCOUNTER — Ambulatory Visit (INDEPENDENT_AMBULATORY_CARE_PROVIDER_SITE_OTHER): Payer: Medicaid Other | Admitting: Orthopedic Surgery

## 2011-01-06 ENCOUNTER — Encounter: Payer: Self-pay | Admitting: Orthopedic Surgery

## 2011-01-06 VITALS — BP 100/64 | Ht 68.5 in | Wt 261.0 lb

## 2011-01-06 DIAGNOSIS — M25569 Pain in unspecified knee: Secondary | ICD-10-CM

## 2011-01-06 MED ORDER — HYDROCODONE-ACETAMINOPHEN 5-325 MG PO TABS: 1.0000 | ORAL_TABLET | ORAL | Status: DC | PRN

## 2011-01-06 NOTE — Progress Notes (Signed)
The patient continues to have LEFT lower extremity radicular leg pain she had an MRI last year and it showed the following:  L3-4: Mild disc desiccation along with a diffuse bulging annulus  in combination with mild to moderate facet disease creates early  spinal and bilateral lateral recess stenosis. No foraminal  Stenosis.  S/p 3 esi still having pain   Had PT   Takes 5 mg hydrocodone for pain   Refer to N. surgrery

## 2011-01-06 NOTE — Patient Instructions (Signed)
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

## 2011-01-11 ENCOUNTER — Telehealth: Payer: Self-pay | Admitting: Radiology

## 2011-01-11 NOTE — Telephone Encounter (Signed)
I faxed a referral to [facility] to be seen for spinal stenosis of l-spine.

## 2011-01-17 ENCOUNTER — Telehealth: Payer: Self-pay | Admitting: Radiology

## 2011-01-17 NOTE — Telephone Encounter (Signed)
I called to give the patient her appointment at [facility] with Dr. Jeral Fruit on 02-03-11 at 9:40. Patient is aware to take a copy of her films.

## 2011-03-07 ENCOUNTER — Telehealth: Payer: Self-pay | Admitting: Orthopedic Surgery

## 2011-03-07 ENCOUNTER — Other Ambulatory Visit: Payer: Self-pay | Admitting: *Deleted

## 2011-03-07 DIAGNOSIS — M25569 Pain in unspecified knee: Secondary | ICD-10-CM

## 2011-03-07 MED ORDER — HYDROCODONE-ACETAMINOPHEN 5-325 MG PO TABS: 1.0000 | ORAL_TABLET | ORAL | Status: DC | PRN

## 2011-03-07 NOTE — Telephone Encounter (Signed)
Patient left voice mail message inquiring about prescription refill for her pain medication from Friday, 03/04/11.  Her phone is [phone].

## 2011-03-07 NOTE — Telephone Encounter (Signed)
Med was called in today

## 2011-03-07 NOTE — Telephone Encounter (Signed)
Patient called back about status of Hydrocodone 5/325, which she states was re-faxed from pharmacy today (originally faxed Thursday 03/03/11.  She states it came from her new pharmacy [facility] in [place], [place] [phone].  Patient ph# is [phone].  Please advise.

## 2011-03-08 NOTE — Telephone Encounter (Signed)
Patient aware; states pharmacy is delivering med today.

## 2011-03-17 ENCOUNTER — Other Ambulatory Visit: Payer: Self-pay | Admitting: Neurosurgery

## 2011-03-17 DIAGNOSIS — M549 Dorsalgia, unspecified: Secondary | ICD-10-CM

## 2011-03-17 DIAGNOSIS — M5410 Radiculopathy, site unspecified: Secondary | ICD-10-CM

## 2011-03-17 DIAGNOSIS — IMO0002 Reserved for concepts with insufficient information to code with codable children: Secondary | ICD-10-CM

## 2011-03-21 ENCOUNTER — Ambulatory Visit
Admission: RE | Admit: 2011-03-21 | Discharge: 2011-03-21 | Disposition: A | Discharge status: Home / Self Care | Payer: Medicaid Other | Source: Ambulatory Visit | Attending: Neurosurgery | Admitting: Neurosurgery

## 2011-03-21 DIAGNOSIS — M549 Dorsalgia, unspecified: Secondary | ICD-10-CM

## 2011-03-21 DIAGNOSIS — M5410 Radiculopathy, site unspecified: Secondary | ICD-10-CM

## 2011-03-21 DIAGNOSIS — IMO0002 Reserved for concepts with insufficient information to code with codable children: Secondary | ICD-10-CM

## 2011-03-21 DIAGNOSIS — M4800 Spinal stenosis, site unspecified: Secondary | ICD-10-CM

## 2011-03-21 DIAGNOSIS — M5430 Sciatica, unspecified side: Secondary | ICD-10-CM

## 2011-03-21 MED ORDER — DIAZEPAM 5 MG PO TABS
10.0000 mg | ORAL_TABLET | Freq: Once | ORAL | Status: AC
  Administered 2011-03-21: 10 mg via ORAL

## 2011-03-21 MED ORDER — IOHEXOL 180 MG/ML  SOLN
18.0000 mL | Freq: Once | INTRAMUSCULAR | Status: AC | PRN
  Administered 2011-03-21: 18 mL via INTRATHECAL

## 2011-03-21 MED ORDER — HYDROCODONE-ACETAMINOPHEN 5-325 MG PO TABS
2.0000 | ORAL_TABLET | Freq: Once | ORAL | Status: AC
  Administered 2011-03-21: 2 via ORAL

## 2011-03-21 NOTE — Progress Notes (Signed)
Patient states she has been off her Abilify, Prozac and Ritalin for the past two days. jkl

## 2011-03-21 NOTE — Patient Instructions (Signed)
Myelogram and Lumbar Puncture Discharge Instructions  1. Go home and rest quietly for the next 24 hours.  It is important to lie flat for the next 24 hours.  Get up only to go to the restroom.  You may lie in the bed or on a couch on your back, your stomach, your left side or your right side.  You may have one pillow under your head.  You may have pillows between your knees while you are on your side or under your knees while you are on your back.  2. DO NOT drive today.  Recline the seat as far back as it will go, while still wearing your seat belt, on the way home.  3. You may get up to go to the bathroom as needed.  You may sit up for 10 minutes to eat.  You may resume your normal diet and medications unless otherwise indicated.  4. The incidence of headache, nausea, or vomiting is about 5% (one in 20 patients).  If you develop a headache, lie flat and drink plenty of fluids until the headache goes away.  Caffeinated beverages may be helpful.  If you develop severe nausea and vomiting or a headache that does not go away with flat bed rest, call [phone].  5. You may resume normal activities after your 24 hours of bed rest is over; however, do not exert yourself strongly or do any heavy lifting tomorrow.  6. Call your physician for a follow-up appointment.  The results of your myelogram will be sent directly to your physician by the following day.  7. If you have any questions or if complications develop after you arrive home, please call [phone].  Discharge instructions have been explained to the patient.  The patient, or the person responsible for the patient, fully understands these instructions.   You may resume Abilify, Prozac and Ritalin on Tuesday, March 22, 2011, after 9:30a.m.

## 2011-04-07 ENCOUNTER — Telehealth (HOSPITAL_COMMUNITY): Payer: Self-pay | Admitting: Dietician

## 2011-04-07 NOTE — Telephone Encounter (Signed)
Sent letter to pt home via [place] [employer] in attempt to contact pt to schedule an appointment.

## 2011-04-07 NOTE — Telephone Encounter (Signed)
Received referral from Dr. Ouida Sills for dx: diabetes.

## 2011-04-12 NOTE — Telephone Encounter (Signed)
Appointment scheduled for 04/12/10 at 10:00 AM.

## 2011-04-13 ENCOUNTER — Encounter (HOSPITAL_COMMUNITY): Payer: Self-pay | Admitting: Dietician

## 2011-04-13 NOTE — Progress Notes (Signed)
Outpatient Initial Nutrition Assessment  Date:04/13/2011   Time: 10:00 AM  Referring Physician: Dr. Ouida Sills Reason for Visit: diabetes  Nutrition Assessment:  Ht: 67.25" Wt: 266# IBW: 136# %IBW: 196% UBW: 270# %UBW: 99% BMI: 41.35 Goal Weight: 236 (10% weight loss) Weight hx: Pt reports that her highest was was 270#, which she has maintained for several years. Her lowest adult weight was at age 51 at 125#. Pt reports that she has progressively gained weight since being diagnosed with thyroid disease in 1987.   Estimated nutritional needs: 2072-2261 kcals daily, 91-121 grams protein daily, 2.1-2.3 L fluid daily  PMH:  Past Medical History  Diagnosis Date  . Diabetes mellitus   . HTN (hypertension)   . Thyroid disease   . Elevated cholesterol   . Fibromyalgia   . Anxiety   . Depression     Medications:  Current Outpatient Rx  Name Route Sig Dispense Refill  . ABILIFY PO Oral Take by mouth.      Marland Kitchen PROZAC PO Oral Take by mouth.      Marland Kitchen GLUCOTROL XL PO Oral Take by mouth.      Marland Kitchen HYDROCODONE-ACETAMINOPHEN 5-325 MG PO TABS Oral Take 1 tablet by mouth every 4 (four) hours as needed. 60 tablet 5  . IBUPROFEN 800 MG PO TABS Oral Take 800 mg by mouth every 8 (eight) hours as needed.      Marland Kitchen LEVOXYL PO Oral Take by mouth.      . METFORMIN HCL PO Oral Take by mouth.      . METHOCARBAMOL 500 MG PO TABS Oral Take 1 tablet (500 mg total) by mouth 3 (three) times daily. 60 tablet 1  . PREDNISONE (PAK) 10 MG PO TABS  Single strength sterile prednisone Dosepak 12 days as directed 10 tablet 0  . PREGABALIN 50 MG PO CAPS Oral Take 50 mg by mouth 3 (three) times daily.     Marland Kitchen PREGABALIN 50 MG PO CAPS Oral Take 1 capsule (50 mg total) by mouth 3 (three) times daily. 90 capsule 2    Labs:  CMP     Component Value Date/Time   NA 139 08/17/2010 1400   K 4.0 08/17/2010 1400   CL 103 08/17/2010 1400   CO2 25 08/17/2010 1400   GLUCOSE 124* 08/17/2010 1400   BUN 21 08/17/2010 1400   CREATININE 0.89  08/17/2010 1400   CALCIUM 10.4 08/17/2010 1400   PROT 8.3 04/25/2010 1928   ALBUMIN 4.5 04/25/2010 1928   AST 74* 04/25/2010 1928   ALT 101* 04/25/2010 1928   ALKPHOS 68 04/25/2010 1928   BILITOT 0.5 04/25/2010 1928   GFRNONAA >60 08/17/2010 1400   GFRAA  Value: >60        The eGFR has been calculated using the MDRD equation. This calculation has not been validated in all clinical situations. eGFR's persistently <60 mL/min signify possible Chronic Kidney Disease. 08/17/2010 1400     Lipid Panel  No results found for this basename: chol, trig, hdl, cholhdl, vldl, ldlcalc     No results found for this basename: HGBA1C   Lab Results  Component Value Date   CREATININE 0.89 08/17/2010   Per Dr. Alonza Smoker notes, Hgb A1c: 9.0, fasting glucose: 212, Albumin: 4.5, Total Cholesterol: 177, HDL: 60, LDL: 85, Triglycerides: 161  Nutrition hx/habits: Ms Sanford is a very nice woman who lives in [place] with her fiance and 59 year old son, Gregary Signs. Her son, who shares cooking responsibilities with her, is present with  her today. 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 She reports she has had diabetes for 4-5 years and was relatively controlled until recently. She reports CBGs 190's (AM fasting) and 175-190 (after dinner). She reports that they would usually run from 124-150. She was recently prescribed Byetta. She avoids sweet tea and soda. She rarely eats out. She drinks water throughout the day. She recently started working out 30 minutes a day 5 times per week on her exercise bike.   Diet recall: Breakfast (8:30-9 AM): cereal (cheerios) with 2% milk OR oatmeal, coffee with cream; Lunch (12:30-1 PM): [place] sandwich with mayo, water; Dinner (5-6 PM): chicken OR fish OR lean beef, fresh vegetable, sweet potato  Nutrition Diagnosis: Inconsistent carbohydrate intake r/t varying amounts of carbohydrate intake per meal AEB Hgb A1c: 9.0.  Nutrition Intervention: Nutrition rx: 1500  kcal NAS, diabetic diet; 3 meals/day (45-60 grams carbs per meal); low calorie beverages only; 30 minutes physical activity daily  Education/Counseling Provided: Educated pt and son on diabetic diet principles; emphasized portion control, plate method, sources of carbohydrate, and carbohydrate counting. Also advices on slow, moderate weight loss of 1-2# per week. Provided plate method, managing your diabetes, and handouts from the Academy of Nutrition and Dietetics re: carb counting.   Understanding, Motivation, Ability to Follow Recommendations: Expect good compliance.   Monitoring and Evaluation: Goals: 1) 1-2# weight loss per week; 2) 30 minutes physical activity daily; 2) Hgb A1c less than 7.0  Recommendations: 1) For weight loss: 1572-1761 kcals daily; 2) Use artificial sweeteners for tea and coffee (provided Splenda samples); 3) Use measuring cups to ensure proper portions; 4) Continue with exercise regimen   F/U: PRN. Provided RD contact information.   Orlene Plum, RD  04/13/2011  Time: 10:00 AM

## 2011-04-19 ENCOUNTER — Other Ambulatory Visit (HOSPITAL_COMMUNITY): Payer: Self-pay | Admitting: Internal Medicine

## 2011-04-19 DIAGNOSIS — Z139 Encounter for screening, unspecified: Secondary | ICD-10-CM

## 2011-05-24 ENCOUNTER — Other Ambulatory Visit: Payer: Self-pay | Admitting: *Deleted

## 2011-05-24 DIAGNOSIS — M25569 Pain in unspecified knee: Secondary | ICD-10-CM

## 2011-05-24 MED ORDER — HYDROCODONE-ACETAMINOPHEN 5-325 MG PO TABS: 1.0000 | ORAL_TABLET | ORAL | Status: DC | PRN

## 2011-05-30 ENCOUNTER — Ambulatory Visit (HOSPITAL_COMMUNITY): Payer: Medicaid Other

## 2011-05-30 ENCOUNTER — Ambulatory Visit (HOSPITAL_COMMUNITY)
Admission: RE | Admit: 2011-05-30 | Discharge: 2011-05-30 | Disposition: A | Discharge status: Home / Self Care | Payer: Medicaid Other | Source: Ambulatory Visit | Attending: Internal Medicine | Admitting: Internal Medicine

## 2011-05-30 DIAGNOSIS — Z1231 Encounter for screening mammogram for malignant neoplasm of breast: Secondary | ICD-10-CM | POA: Insufficient documentation

## 2011-05-30 DIAGNOSIS — Z139 Encounter for screening, unspecified: Secondary | ICD-10-CM

## 2011-06-02 ENCOUNTER — Ambulatory Visit (HOSPITAL_COMMUNITY)
Admission: RE | Admit: 2011-06-02 | Discharge: 2011-06-02 | Disposition: A | Discharge status: Home / Self Care | Payer: Medicaid Other | Source: Ambulatory Visit | Attending: Anesthesiology | Admitting: Anesthesiology

## 2011-06-02 DIAGNOSIS — E119 Type 2 diabetes mellitus without complications: Secondary | ICD-10-CM | POA: Insufficient documentation

## 2011-06-02 DIAGNOSIS — I10 Essential (primary) hypertension: Secondary | ICD-10-CM | POA: Insufficient documentation

## 2011-06-02 DIAGNOSIS — M545 Low back pain, unspecified: Secondary | ICD-10-CM | POA: Insufficient documentation

## 2011-06-02 DIAGNOSIS — IMO0001 Reserved for inherently not codable concepts without codable children: Secondary | ICD-10-CM | POA: Insufficient documentation

## 2011-06-02 NOTE — Evaluation (Signed)
Physical Therapy Evaluation  Patient Details  Name: [name] MRN: [id number] Date of Birth: [birth year]  Today's Date: 06/02/2011 Time: 0930-1015 Time Calculation (min): 45 min  Visit#: 1  of 1   Re-eval:   Assessment Diagnosis: Low back pain Prior Therapy: PT , injections  Past Medical History:  Past Medical History  Diagnosis Date  . Diabetes mellitus   . HTN (hypertension)   . Thyroid disease   . Elevated cholesterol   . Fibromyalgia   . Anxiety   . Depression    Past Surgical History:  Past Surgical History  Procedure Date  . Carpal tunnel release bilateral  . Vaginal hysterectomy   . Shoulder surgery left  . Gallbladder   . Knee arthroscopy right    Subjective Symptoms/Limitations Symptoms: Pt has had LBP for two to three years now after she slipped in a tub and had two bulge discs.  They have tried injection , physical therapy and pain medication.   I was on hydrocodone and rebaxon but they have stopped the hydrocodone,. How long can you sit comfortably?: Pt states no problem How long can you stand comfortably?: Increased pain after fifteen twenty minutes L side greater than R. How long can you walk comfortably?: Able to walk for ten to fifteen minutes/ Pain Assessment Currently in Pain?: Yes Pain Score:   7 Pain Location: Back  Precautions/Restrictions         [facility] ATTNClaris Hawkins [address] [place], [place] [address] Phone: [phone] Fax: [phone]     INITIAL EVALUATION  Physical Therapy     Patient Name: [name] Date Of Birth: 03-30-1960  Guardian Name: N/A Treatment ICD-9 Code: 7242  Address: [address] Date of Evaluation: 06/02/2011  [place], [place] [address] Requested Dates of Service: 06/02/2011 - 06/02/2011       Therapy History: No known therapy for this problem  Reason For Referral: Recipient has a new injury, disease or condition  Prior Level of Function:  Independent/Modified Independent with all ADLs (OT/PT) or Audition, Communication, Voice and/or Swallowing Skills (ST/AUD)  Additional Medical History: [name] has been referred for a TENs unit evaluation. [name] has tried therapy, injection and medication in the past to relieve her back pain with limited success. She is now looking for a non-narcotic relief. The patient was examined, expalained the use of the Tens unit and was taken through continuous, burst and modulated with various width and rate levels to determine the most comfortable for the pt. The pt left using modulated with high width moderate rate pulse.  Prematurity: N/A  Severity Level: N/A       Treatment Goals:  1. Goal: Knowledge of what a TENs unit is and how it works  Baseline: NOW I  Duration: 1 Week(s)  Goal: I in use of TENS and all of it various settings.  Baseline: now I  Duration: 1 Week(s)         Treatment Frequency/Duration:  1x/week for 1 weeks  Units per visit: N/A    Additional Information: N/A           Therapist Signature  Date Physician Signature  Date    [name]       Therapist Name  Physician Name    Physical Therapy Assessment and Plan PT Assessment and Plan Clinical Impression Statement: Pt with chronic pain referred for TENS unit. Rehab Potential: Good PT Frequency: Min 1X/week PT Duration:  (1  visit only) PT Treatment/Interventions: Patient/family education (Tens unit -estim) PT Plan: one time treatment only    Goals    Problem List Patient Active Problem List  Diagnoses  . LOWER LEG, ARTHRITIS, DEGEN./OSTEO  . DERANGEMENT OF POSTERIOR HORN OF MEDIAL MENISCUS  . DERANGEMENT MENISCUS  . CHONDROMALACIA OF PATELLA  . JOINT EFFUSION, KNEE  . KNEE PAIN  . H N P-LUMBAR  . BACK PAIN  . ANSERINE BURSITIS, RIGHT  . LUMBOSACRAL STRAIN  . Knee pain  . Sciatica  . Medial meniscus, posterior horn derangement  . Spinal stenosis    PT - End of Session Activity Tolerance:  Patient tolerated treatment well General Behavior During Session: [facility] for tasks performed Cognition: [facility] for tasks performed PT Plan of Care PT Home Exercise Plan: instruction of TENS Consulted and Agree with Plan of Care: Patient  [name],[name] 06/02/2011, 11:38 AM

## 2011-06-15 ENCOUNTER — Telehealth (HOSPITAL_COMMUNITY): Payer: Self-pay | Admitting: Physical Therapy

## 2011-06-27 ENCOUNTER — Other Ambulatory Visit: Payer: Self-pay | Admitting: Orthopedic Surgery

## 2011-07-13 ENCOUNTER — Other Ambulatory Visit: Payer: Self-pay | Admitting: *Deleted

## 2011-07-13 DIAGNOSIS — M25569 Pain in unspecified knee: Secondary | ICD-10-CM

## 2011-07-13 MED ORDER — HYDROCODONE-ACETAMINOPHEN 5-325 MG PO TABS: 1.0000 | ORAL_TABLET | ORAL | Status: DC | PRN

## 2011-07-14 ENCOUNTER — Other Ambulatory Visit: Payer: Self-pay | Admitting: *Deleted

## 2011-07-14 MED ORDER — IBUPROFEN 800 MG PO TABS: 800.0000 mg | ORAL_TABLET | Freq: Three times a day (TID) | ORAL | Status: DC | PRN

## 2011-07-26 ENCOUNTER — Ambulatory Visit (INDEPENDENT_AMBULATORY_CARE_PROVIDER_SITE_OTHER): Payer: Medicaid Other | Admitting: Orthopedic Surgery

## 2011-07-26 ENCOUNTER — Encounter: Payer: Self-pay | Admitting: Orthopedic Surgery

## 2011-07-26 ENCOUNTER — Other Ambulatory Visit: Payer: Self-pay | Admitting: *Deleted

## 2011-07-26 VITALS — BP 100/62 | Ht 67.0 in | Wt 245.0 lb

## 2011-07-26 DIAGNOSIS — M76899 Other specified enthesopathies of unspecified lower limb, excluding foot: Secondary | ICD-10-CM

## 2011-07-26 DIAGNOSIS — M25569 Pain in unspecified knee: Secondary | ICD-10-CM

## 2011-07-26 DIAGNOSIS — M7052 Other bursitis of knee, left knee: Secondary | ICD-10-CM | POA: Insufficient documentation

## 2011-07-26 DIAGNOSIS — M179 Osteoarthritis of knee, unspecified: Secondary | ICD-10-CM | POA: Insufficient documentation

## 2011-07-26 DIAGNOSIS — M549 Dorsalgia, unspecified: Secondary | ICD-10-CM | POA: Insufficient documentation

## 2011-07-26 DIAGNOSIS — M1710 Unilateral primary osteoarthritis, unspecified knee: Secondary | ICD-10-CM

## 2011-07-26 MED ORDER — METHOCARBAMOL 500 MG PO TABS: 500.0000 mg | ORAL_TABLET | Freq: Four times a day (QID) | ORAL | Status: DC

## 2011-07-26 MED ORDER — HYDROCODONE-ACETAMINOPHEN 5-325 MG PO TABS: 1.0000 | ORAL_TABLET | ORAL | Status: DC | PRN

## 2011-07-26 MED ORDER — IBUPROFEN 800 MG PO TABS: 800.0000 mg | ORAL_TABLET | Freq: Three times a day (TID) | ORAL | Status: DC | PRN

## 2011-07-26 NOTE — Patient Instructions (Signed)
You have received a steroid shot. 15% of patients experience increased pain at the injection site with in the next 24 hours. This is best treated with ice and tylenol extra strength 2 tabs every 8 hours. If you are still having pain please call the office.   Apply ice to the knee for 2 weeks , 3 x a day for 30 min   No bike x 3 weeks

## 2011-07-26 NOTE — Progress Notes (Signed)
Subjective:     Patient ID: [name], female   DOB: 1961-02-04, 51 y.o.   MRN: [id number] Chief Complaint  Patient presents with  . Follow-up    Left knee pain.    HPI This is a 51 year old female, status post LEFT knee arthroscopy and has a history of chronic back pain secondary to degenerative disc disease, which is nonsurgical. Based on MRI findings, presents back with pain in her LEFT knee. She had a knee arthroscopy in 2012, which showed patellofemoral and medial arthritis. She also had a meniscectomy for previous torn medial meniscus and a prior surgery. However, in 2012 she was only found to have arthritic changes with no new tear,  She's had epidural series for her chronic back pain.  She has pain over the medial aspect of the joint and has pain when she is sitting. She also has pain over the bursa. Her only new activities have included infectious on his bike.  The knee. Pain is nonradiating. She is noticed it when she is sitting with the knee flexed. There is also a catching sensation when she extends her knee.     Review of Systems Cough, cold Back pain mild controlled with robaxin and hydrocodone 5 mg     Objective:   Physical Exam Physical Exam(12) GENERAL: normal development   CDV: pulses are normal   Skin: normal  Psychiatric: awake, alert and oriented, flat affect   Neuro: normal sensation in left leg   MSK No assistive devices are used for ambulation, and there is no noticeable limp, although the gait pattern shows slowed cadence 1 LEFT knee exam shows no effusion. Her to arthroscopy portal sites, which are nontender. There is tenderness over the medial joint line, as well as the patellofemoral, medial facet. There is catching sensation at 30, extension, from the flexed position. There is some crepitance on patellofemoral test and pain with patellofemoral compression 2 There is tenderness over the medial bursa 3 Ligaments are stable 4 Meniscal signs  are normal 5 Muscle tone is excellent    Assessment: Bursitis an arthritis exacerbation, LEFT knee    Plan: Inject LEFT knee, bursa, and joint  Knee  Injection Procedure Note  Pre-operative Diagnosis: left knee oa  Post-operative Diagnosis: same  Indications: pain  Anesthesia: ethyl chloride   Procedure Details   Verbal consent was obtained for the procedure. Time out was completed.The joint was prepped with alcohol, followed by  Ethyl chloride spray and A 20 gauge needle was inserted into the knee via lateral approach; 4ml 1% lidocaine and 1 ml of depomedrol  was then injected into the joint . The needle was removed and the area cleansed and dressed.  Complications:  None; patient tolerated the procedure well.  Knee  Injection Procedure Note  Pre-operative Diagnosis: left knee bursitis Post-operative Diagnosis: same  Indications: pain  Anesthesia: ethyl chloride   Procedure Details   Verbal consent was obtained for the procedure. Time out was completed.The pes bursa  was prepped with alcohol, followed by  Ethyl chloride spray and an 18g gauge needle was inserted into the knee via medial direct approach; 4ml 1% lidocaine and 1 ml of depomedrol  was then injected into the joint . The needle was removed and the area cleansed and dressed.  Complications:  None; patient tolerated the procedure well.      Assessment:     Bursitis LEFT knee.  Osteoarthritis, LEFT knee.  Chronic back pain.      Plan:  As noted above. Follow up as needed refills on ibuprofen, and Robaxin were given.

## 2011-09-20 ENCOUNTER — Ambulatory Visit (INDEPENDENT_AMBULATORY_CARE_PROVIDER_SITE_OTHER): Payer: Medicaid Other | Admitting: Orthopedic Surgery

## 2011-09-20 ENCOUNTER — Encounter: Payer: Self-pay | Admitting: Orthopedic Surgery

## 2011-09-20 VITALS — BP 104/60 | Ht 67.0 in | Wt 237.0 lb

## 2011-09-20 DIAGNOSIS — IMO0002 Reserved for concepts with insufficient information to code with codable children: Secondary | ICD-10-CM

## 2011-09-20 DIAGNOSIS — M7050 Other bursitis of knee, unspecified knee: Secondary | ICD-10-CM

## 2011-09-20 NOTE — Patient Instructions (Addendum)
You have received a steroid shot. 15% of patients experience increased pain at the injection site with in the next 24 hours. This is best treated with ice and tylenol extra strength 2 tabs every 8 hours. If you are still having pain please call the office.   Apply ice 3 x a day for the next 2 weeks   Avoid exercise bike next 3 weeks   Walking is ok

## 2011-09-20 NOTE — Progress Notes (Signed)
Subjective:    Patient ID: [name], female    DOB: 1960/04/19, 51 y.o.   MRN: [id number]  HPI Comments: Consult requested by Dr. Adriana Mccallum  Right knee pain  New problem  Patient had a right knee arthroscopy many years ago did well. She was treated for bursitis of left knee arthritis as well.She now presents with recent onset of right knee pain over the medial aspect of the leg and proximal joint associated with using an exercise bike. She's been able to lose over 40 pounds by exercising and changing her diet.  She does not report injury. The pain is quite severe however and is associated with exercise. She denies catching locking or giving way  The review of systems reveals no tingling in legs no weakness in the leg.  Past Medical History:   Diabetes mellitus                                            HTN (hypertension)                                           Thyroid disease                                              Elevated cholesterol                                         Fibromyalgia                                                 Anxiety                                                      Depression                                                     Knee Pain       Review of Systems     Objective:   Physical Exam  Musculoskeletal:       BP 104/60  Ht 5\' 7"  (1.702 m)  Wt 237 lb (107.502 kg)  BMI 37.12 kg/m2 General appearance is normal  She is oriented x3 she appears to be either medicated or she has just taken of some kind and she is very sluggish. Her affect is very flat and slow  She is ambulating without assistive device  Right knee exam tenderness over the bursa with swelling. This does not affect her range of motion stability or strength skin is intact pulses are good sensation is normal coordination is normal balance  is normal  McMurray sign is negative            Assessment & Plan:  Bursitis right knee  Inject right  knee  Use ice and activity modification should resolve over the next 2-3 weeks  Knee  Injection Procedure Note  Pre-operative Diagnosis: right knee Bursitis  Post-operative Diagnosis: same  Indications: pain  Anesthesia: ethyl chloride   Procedure Details   Verbal consent was obtained for the procedure. Time out was completed.The Skin was prepped with alcohol, followed by  Ethyl chloride spray and A 25 gauge needle was inserted into the pes bursa of the  knee ; 4ml 1% lidocaine and 1 ml of depomedrol  was then injected into the bursa The needle was removed and the area cleansed and dressed.  Complications:  None; patient tolerated the procedure well.

## 2011-09-27 ENCOUNTER — Emergency Department (HOSPITAL_COMMUNITY)
Admission: EM | Admit: 2011-09-27 | Discharge: 2011-09-27 | Disposition: A | Discharge status: Home / Self Care | Payer: Medicaid Other | Attending: Emergency Medicine | Admitting: Emergency Medicine

## 2011-09-27 ENCOUNTER — Encounter (HOSPITAL_COMMUNITY): Payer: Self-pay | Admitting: Emergency Medicine

## 2011-09-27 DIAGNOSIS — Z79899 Other long term (current) drug therapy: Secondary | ICD-10-CM | POA: Insufficient documentation

## 2011-09-27 DIAGNOSIS — F3289 Other specified depressive episodes: Secondary | ICD-10-CM | POA: Insufficient documentation

## 2011-09-27 DIAGNOSIS — E119 Type 2 diabetes mellitus without complications: Secondary | ICD-10-CM | POA: Insufficient documentation

## 2011-09-27 DIAGNOSIS — F329 Major depressive disorder, single episode, unspecified: Secondary | ICD-10-CM

## 2011-09-27 DIAGNOSIS — E7800 Pure hypercholesterolemia, unspecified: Secondary | ICD-10-CM | POA: Insufficient documentation

## 2011-09-27 DIAGNOSIS — I10 Essential (primary) hypertension: Secondary | ICD-10-CM | POA: Insufficient documentation

## 2011-09-27 DIAGNOSIS — E079 Disorder of thyroid, unspecified: Secondary | ICD-10-CM | POA: Insufficient documentation

## 2011-09-27 LAB — CBC WITH DIFFERENTIAL/PLATELET
Basophils Absolute: 0 10*3/uL (ref 0.0–0.1)
Basophils Relative: 0 % (ref 0–1)
Eosinophils Absolute: 0.2 10*3/uL (ref 0.0–0.7)
Eosinophils Relative: 2 % (ref 0–5)
HCT: 44.3 % (ref 36.0–46.0)
Hemoglobin: 15.3 g/dL — ABNORMAL HIGH (ref 12.0–15.0)
Lymphocytes Relative: 40 % (ref 12–46)
Lymphs Abs: 4.1 10*3/uL — ABNORMAL HIGH (ref 0.7–4.0)
MCH: 32.5 pg (ref 26.0–34.0)
MCHC: 34.5 g/dL (ref 30.0–36.0)
MCV: 94.1 fL (ref 78.0–100.0)
Monocytes Absolute: 0.6 10*3/uL (ref 0.1–1.0)
Monocytes Relative: 6 % (ref 3–12)
Neutro Abs: 5.4 10*3/uL (ref 1.7–7.7)
Neutrophils Relative %: 52 % (ref 43–77)
Platelets: 271 10*3/uL (ref 150–400)
RBC: 4.71 MIL/uL (ref 3.87–5.11)
RDW: 13 % (ref 11.5–15.5)
WBC: 10.4 10*3/uL (ref 4.0–10.5)

## 2011-09-27 LAB — BASIC METABOLIC PANEL
BUN: 13 mg/dL (ref 6–23)
CO2: 25 mEq/L (ref 19–32)
Calcium: 9.9 mg/dL (ref 8.4–10.5)
Chloride: 102 mEq/L (ref 96–112)
Creatinine, Ser: 0.85 mg/dL (ref 0.50–1.10)
GFR calc Af Amer: 90 mL/min — ABNORMAL LOW (ref >90)
GFR calc non Af Amer: 77 mL/min — ABNORMAL LOW (ref >90)
Glucose, Bld: 82 mg/dL (ref 70–99)
Potassium: 4 mEq/L (ref 3.5–5.1)
Sodium: 138 mEq/L (ref 135–145)

## 2011-09-27 LAB — RAPID URINE DRUG SCREEN, HOSP PERFORMED
Amphetamines: NOT DETECTED
Barbiturates: NOT DETECTED
Benzodiazepines: POSITIVE — AB
Cocaine: NOT DETECTED
Opiates: NOT DETECTED
Tetrahydrocannabinol: NOT DETECTED

## 2011-09-27 LAB — ETHANOL: Alcohol, Ethyl (B): <11 mg/dL (ref 0–11)

## 2011-09-27 NOTE — ED Notes (Signed)
Patient signed no harm contract with Frances Maywood, LCSW and this RN present to witness. Patient to be discharged to follow up with [facility] on Thursday, July 11th at 10 am. Patient gave verbal understanding.

## 2011-09-27 NOTE — ED Notes (Signed)
Pt signed hard copy for d/c instructions that were reviewed by TY, RN

## 2011-09-27 NOTE — ED Notes (Signed)
Patient lying in right side in room. Alert, cooperative with staff. Patient changed into blue scrubs, wanded by security, belongings (clothes/flip flops) placed in belonging bags and placed locked cabinet in ED. Patient advised of precautions and agreeable at this time.
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
## 2011-09-27 NOTE — ED Provider Notes (Signed)
History     CSN: [id number]  Arrival date & time 09/27/11  1049   First MD Initiated Contact with Patient 09/27/11 1120      Chief Complaint  Patient presents with  . V70.1    (Consider location/radiation/quality/duration/timing/severity/associated sxs/prior treatment) HPI...Marland Kitchenfeelings of sadness for 4-5 days. No frank suicidal ideation. Has a regular psychiatrist. Never been admitted to a psychiatric institution. Nothing makes symptoms better or worse. Symptoms are moderate.  Past Medical History  Diagnosis Date  . Diabetes mellitus   . HTN (hypertension)   . Thyroid disease   . Elevated cholesterol   . Fibromyalgia   . Anxiety   . Depression     Past Surgical History  Procedure Date  . Carpal tunnel release bilateral  . Vaginal hysterectomy   . Shoulder surgery left  . Gallbladder   . Knee arthroscopy right    Family History  Problem Relation Age of Onset  . Cancer    . Diabetes    . Heart disease    . Arthritis    . Lung disease      History  Substance Use Topics  . Smoking status: Current Everyday Smoker -- 0.5 packs/day  . Smokeless tobacco: Not on file  . Alcohol Use: No    OB History    Grav Para Term Preterm Abortions TAB SAB Ect Mult Living                  Review of Systems  All other systems reviewed and are negative.    Allergies  Review of patient's allergies indicates no known allergies.  Home Medications   Current Outpatient Rx  Name Route Sig Dispense Refill  . ARIPIPRAZOLE 20 MG PO TABS Oral Take 20 mg by mouth daily.    Marland Kitchen DIAZEPAM 10 MG PO TABS Oral Take 10-20 mg by mouth 4 (four) times daily. Patient takes 1 tablet three times a day and 2 tablets at bedtime    . ESTRADIOL 1 MG PO TABS Oral Take 1 mg by mouth daily.    Marland Kitchen FLUOXETINE HCL 20 MG PO CAPS Oral Take 20 mg by mouth daily.    Marland Kitchen GLIPIZIDE 10 MG PO TABS Oral Take 10 mg by mouth daily.    Marland Kitchen HYDROCODONE-ACETAMINOPHEN 5-325 MG PO TABS Oral Take 1 tablet by mouth every 4  (four) hours as needed. pain    . IBUPROFEN 800 MG PO TABS Oral Take 1 tablet (800 mg total) by mouth every 8 (eight) hours as needed. 90 tablet 5  . LEVOTHYROXINE SODIUM 112 MCG PO TABS Oral Take 112 mcg by mouth daily.    Marland Kitchen METFORMIN HCL 500 MG PO TABS Oral Take 1,000 mg by mouth 2 (two) times daily.    Marland Kitchen METHOCARBAMOL 500 MG PO TABS Oral Take 500 mg by mouth at bedtime.    Marland Kitchen PREGABALIN 75 MG PO CAPS Oral Take 75 mg by mouth at bedtime.    Marland Kitchen RANITIDINE HCL 150 MG PO TABS Oral Take 150 mg by mouth 2 (two) times daily.    Marland Kitchen SIMVASTATIN 20 MG PO TABS Oral Take 20 mg by mouth at bedtime.    . TOPIRAMATE 100 MG PO TABS Oral Take 100 mg by mouth at bedtime.    Marland Kitchen PREGABALIN 50 MG PO CAPS Oral Take 50 mg by mouth 3 (three) times daily.      BP 131/63  Pulse 88  Temp 97.8 F (36.6 C) (Oral)  Resp 20  SpO2 98%  Physical Exam  Nursing note and vitals reviewed. Constitutional: She is oriented to person, place, and time. She appears well-developed and well-nourished.  HENT:  Head: Normocephalic and atraumatic.  Eyes: Conjunctivae and EOM are normal. Pupils are equal, round, and reactive to light.  Neck: Normal range of motion. Neck supple.  Cardiovascular: Normal rate and regular rhythm.   Pulmonary/Chest: Effort normal and breath sounds normal.  Abdominal: Soft. Bowel sounds are normal.  Musculoskeletal: Normal range of motion.  Neurological: She is alert and oriented to person, place, and time.  Skin: Skin is warm and dry.  Psychiatric:       Flat affect    ED Course  Procedures (including critical care time)  Labs Reviewed  CBC WITH DIFFERENTIAL - Abnormal; Notable for the following:    Hemoglobin 15.3 (*)     Lymphs Abs 4.1 (*)     All other components within normal limits  BASIC METABOLIC PANEL - Abnormal; Notable for the following:    GFR calc non Af Amer 77 (*)     GFR calc Af Amer 90 (*)     All other components within normal limits  URINE RAPID DRUG SCREEN (HOSP  PERFORMED) - Abnormal; Notable for the following:    Benzodiazepines POSITIVE (*)     All other components within normal limits  ETHANOL   No results found.   1. Depression       MDM  Patient states no suicidal ideation to me. Will get behavioral health consult.        Donnetta Hutching, MD 09/27/11 [phone]

## 2011-09-27 NOTE — ED Notes (Signed)
Patient reports depression and SI x 3 days. Unable to get in touch with her MD. States auditory hallucinations telling her to hurt herself.

## 2011-10-03 ENCOUNTER — Other Ambulatory Visit: Payer: Self-pay | Admitting: *Deleted

## 2011-10-03 MED ORDER — HYDROCODONE-ACETAMINOPHEN 5-325 MG PO TABS: 1.0000 | ORAL_TABLET | ORAL | Status: DC | PRN

## 2011-12-06 ENCOUNTER — Encounter: Payer: Self-pay | Admitting: Orthopedic Surgery

## 2011-12-06 ENCOUNTER — Ambulatory Visit (INDEPENDENT_AMBULATORY_CARE_PROVIDER_SITE_OTHER): Payer: Medicaid Other | Admitting: Orthopedic Surgery

## 2011-12-06 VITALS — BP 106/60 | Ht 67.0 in | Wt 221.0 lb

## 2011-12-06 DIAGNOSIS — M25569 Pain in unspecified knee: Secondary | ICD-10-CM

## 2011-12-06 MED ORDER — HYDROCODONE-ACETAMINOPHEN 5-325 MG PO TABS: 1.0000 | ORAL_TABLET | ORAL | Status: DC | PRN

## 2011-12-06 NOTE — Progress Notes (Signed)
Patient ID: [name], female   DOB: Jul 29, 1960, 51 y.o.   MRN: [id number] Chief Complaint  Patient presents with  . Knee Pain    left knee pain, requests injection    Knee  Injection Procedure Note  Pre-operative Diagnosis: left knee oa  Post-operative Diagnosis: same  Indications: pain  Anesthesia: ethyl chloride   Procedure Details   Verbal consent was obtained for the procedure. Time out was completed.The joint was prepped with alcohol, followed by  Ethyl chloride spray and A 20 gauge needle was inserted into the knee via lateral approach; 4ml 1% lidocaine and 1 ml of depomedrol  was then injected into the joint . The needle was removed and the area cleansed and dressed.  Complications:  None; patient tolerated the procedure well.

## 2011-12-06 NOTE — Patient Instructions (Signed)
You have received a steroid shot. 15% of patients experience increased pain at the injection site with in the next 24 hours. This is best treated with ice and tylenol extra strength 2 tabs every 8 hours. If you are still having pain please call the office.    

## 2011-12-14 ENCOUNTER — Telehealth: Payer: Self-pay | Admitting: Orthopedic Surgery

## 2011-12-14 NOTE — Telephone Encounter (Signed)
Message copied by Vickki Hearing on Wed Dec 14, 2011 12:15 PM ------      Message from: Cammie Sickle A      Created: Wed Dec 14, 2011 10:41 AM      Regarding: CPT code needs to be added       RE: [place] WU#[id number], DOS 12/06/11. Code for injec needed to be entered: 20610.  Please review,advise.      Thanks, Okey Regal

## 2012-02-06 ENCOUNTER — Other Ambulatory Visit: Payer: Self-pay | Admitting: Orthopedic Surgery

## 2012-02-06 DIAGNOSIS — M25569 Pain in unspecified knee: Secondary | ICD-10-CM

## 2012-02-06 MED ORDER — HYDROCODONE-ACETAMINOPHEN 5-325 MG PO TABS: 1.0000 | ORAL_TABLET | ORAL | Status: DC | PRN

## 2012-03-30 ENCOUNTER — Other Ambulatory Visit: Payer: Self-pay

## 2012-03-30 ENCOUNTER — Telehealth: Payer: Self-pay | Admitting: *Deleted

## 2012-03-30 DIAGNOSIS — Z139 Encounter for screening, unspecified: Secondary | ICD-10-CM

## 2012-03-30 NOTE — Telephone Encounter (Signed)
Gastroenterology Pre-Procedure Form   Request Date: 03/30/2012    Requesting Physician: Dr. Ouida Sills     PATIENT INFORMATION:  [name] is a 52 y.o., female (DOB=1960/04/23).  PROCEDURE: Procedure(s) requested: colonoscopy Procedure Reason: screening for colon cancer  PATIENT REVIEW QUESTIONS: The patient reports the following:   1. Diabetes Melitis: yes  2. Joint replacements in the past 12 months: no 3. Major health problems in the past 3 months: no 4. Has an artificial valve or MVP:no 5. Has been advised in past to take antibiotics in advance of a procedure like teeth cleaning: no}    MEDICATIONS & ALLERGIES:    Patient reports the following regarding taking any blood thinners:   Plavix? no Aspirin?yes  Coumadin?  no  Patient confirms/reports the following medications:  Current Outpatient Prescriptions  Medication Sig Dispense Refill  . ARIPiprazole (ABILIFY) 20 MG tablet Take 15 mg by mouth daily.       Marland Kitchen aspirin 81 MG tablet Take 81 mg by mouth daily.      . diazepam (VALIUM) 10 MG tablet Take 10-20 mg by mouth 4 (four) times daily. Patient takes 1 tablet three times a day and 2 tablets at bedtime      . exenatide (BYETTA) 10 MCG/0.04ML SOLN Inject into the skin 2 (two) times daily with a meal. Pt takes 10 mcg twice daily      . FLUoxetine (PROZAC) 20 MG capsule Take 40 mg by mouth daily.       Marland Kitchen levothyroxine (SYNTHROID, LEVOTHROID) 112 MCG tablet Take 112 mcg by mouth daily.      . metFORMIN (GLUCOPHAGE) 500 MG tablet Take 1,000 mg by mouth 2 (two) times daily.      . methocarbamol (ROBAXIN) 500 MG tablet Take 500 mg by mouth at bedtime.      . methylphenidate (RITALIN) 10 MG tablet Take 10 mg by mouth 3 (three) times daily.      . pregabalin (LYRICA) 75 MG capsule Take 75 mg by mouth at bedtime.      . ranitidine (ZANTAC) 150 MG tablet Take 150 mg by mouth 1 day or 1 dose.       . simvastatin (ZOCOR) 20 MG tablet Take 40 mg by mouth at bedtime.       . topiramate  (TOPAMAX) 100 MG tablet Take 100 mg by mouth at bedtime.      Marland Kitchen estradiol (ESTRACE) 1 MG tablet Take 1 mg by mouth daily.      Marland Kitchen glipiZIDE (GLUCOTROL) 10 MG tablet Take 10 mg by mouth daily.      Marland Kitchen HYDROcodone-acetaminophen (NORCO/VICODIN) 5-325 MG per tablet Take 1 tablet by mouth every 4 (four) hours as needed. pain  60 tablet  5  . ibuprofen (ADVIL,MOTRIN) 800 MG tablet Take 1 tablet (800 mg total) by mouth every 8 (eight) hours as needed.  90 tablet  5  . pregabalin (LYRICA) 50 MG capsule Take 50 mg by mouth 3 (three) times daily.        Patient confirms/reports the following allergies:  No Known Allergies  Patient is appropriate to schedule for requested procedure(s): yes  AUTHORIZATION INFORMATION Primary Insurance:   ID #:  Group #:  Pre-Cert / Auth required:  Pre-Cert / Auth #:   Secondary Insurance:   ID #:  Group #:  Pre-Cert / Auth required:  Pre-Cert / Auth #:   No orders of the defined types were placed in this encounter.    SCHEDULE INFORMATION: Procedure  has been scheduled as follows:  Date: 04/16/2012      Time: 9:30 AM  Location: [facility] Short Stay  This Gastroenterology Pre-Precedure Form is being routed to the following provider(s) for review: Jonette Eva, MD

## 2012-03-30 NOTE — Telephone Encounter (Signed)
[name] called to be triaged for a colonoscopy. Please call her back. Thank you.

## 2012-03-30 NOTE — Telephone Encounter (Signed)
PT NEEDS OPV PRIOR TO TCS. SHE WILL NEED PROPOFOL DUE TO POLYPHARMACY.

## 2012-04-02 ENCOUNTER — Other Ambulatory Visit: Payer: Self-pay | Admitting: *Deleted

## 2012-04-02 DIAGNOSIS — M25569 Pain in unspecified knee: Secondary | ICD-10-CM

## 2012-04-02 MED ORDER — HYDROCODONE-ACETAMINOPHEN 5-325 MG PO TABS: 1.0000 | ORAL_TABLET | ORAL | Status: DC | PRN

## 2012-04-02 NOTE — Telephone Encounter (Signed)
LMOM to call.

## 2012-04-02 NOTE — Telephone Encounter (Signed)
Tried to call pt and could not reach her. Took off schedule for colonoscopy on 04/16/2012 and Selena Batten is aware.

## 2012-04-03 ENCOUNTER — Encounter: Payer: Self-pay | Admitting: Gastroenterology

## 2012-04-03 NOTE — Telephone Encounter (Signed)
Pt is scheduled OV with Gerrit Halls, NP on 04/16/2012 at 2:00 PM per Dr. Darrick Penna to schedule colonoscopy. ( ov due to poly pharmacy.

## 2012-04-10 ENCOUNTER — Ambulatory Visit (HOSPITAL_COMMUNITY)
Admission: RE | Admit: 2012-04-10 | Discharge: 2012-04-10 | Disposition: A | Discharge status: Home / Self Care | Payer: Medicaid Other | Source: Ambulatory Visit | Attending: Internal Medicine | Admitting: Internal Medicine

## 2012-04-10 ENCOUNTER — Other Ambulatory Visit (HOSPITAL_COMMUNITY): Payer: Self-pay | Admitting: Internal Medicine

## 2012-04-10 DIAGNOSIS — M25561 Pain in right knee: Secondary | ICD-10-CM

## 2012-04-10 DIAGNOSIS — M25562 Pain in left knee: Secondary | ICD-10-CM

## 2012-04-10 DIAGNOSIS — M25569 Pain in unspecified knee: Secondary | ICD-10-CM | POA: Insufficient documentation

## 2012-04-16 ENCOUNTER — Ambulatory Visit (INDEPENDENT_AMBULATORY_CARE_PROVIDER_SITE_OTHER): Payer: Medicaid Other | Admitting: Gastroenterology

## 2012-04-16 ENCOUNTER — Encounter (HOSPITAL_COMMUNITY): Payer: Self-pay | Admitting: Pharmacy Technician

## 2012-04-16 ENCOUNTER — Ambulatory Visit: Admit: 2012-04-16 | Payer: Self-pay | Admitting: Gastroenterology

## 2012-04-16 ENCOUNTER — Encounter: Payer: Self-pay | Admitting: Gastroenterology

## 2012-04-16 VITALS — BP 116/71 | HR 79 | Temp 98.4°F | Ht 68.5 in | Wt 219.8 lb

## 2012-04-16 DIAGNOSIS — K7689 Other specified diseases of liver: Secondary | ICD-10-CM

## 2012-04-16 DIAGNOSIS — K760 Fatty (change of) liver, not elsewhere classified: Secondary | ICD-10-CM | POA: Insufficient documentation

## 2012-04-16 DIAGNOSIS — Z1211 Encounter for screening for malignant neoplasm of colon: Secondary | ICD-10-CM

## 2012-04-16 SURGERY — COLONOSCOPY
Anesthesia: Moderate Sedation

## 2012-04-16 MED ORDER — PEG 3350-KCL-NA BICARB-NACL 420 G PO SOLR: 4000.0000 mL | ORAL | Status: DC

## 2012-04-16 NOTE — Assessment & Plan Note (Signed)
52 year old female with need for initial screening colonoscopy. No lower GI symptoms currently. She has no FH of colon cancer. Due to polypharmacy, she will be scheduled in the OR with Propofol.  Proceed with colonoscopy with Dr. Darrick Penna in the near future. The risks, benefits, and alternatives have been discussed in detail with the patient. They state understanding and desire to proceed.  Propofol due to polypharmacy.

## 2012-04-16 NOTE — Progress Notes (Signed)
Faxed to PCP

## 2012-04-16 NOTE — Assessment & Plan Note (Signed)
Hx of fatty liver with last [place] in 2011. No HSM on exam. AST/ALT mildly elevated at 38 and 43, respectively. Dicussed low-fat diet, exercise with patient. Return in 6 mos and check LFTs at that time. If continues to remain elevated, proceed with [place] of abd and further work-up if indicated. As of note, pt is on Zocor. If continued or worsened elevation of LFTs, consider different agent.

## 2012-04-16 NOTE — Progress Notes (Signed)
Referring Provider: Carylon Perches, MD Primary Care Physician:  Carylon Perches, MD Primary Gastroenterologist:  Dr. Darrick Penna   Chief Complaint  Patient presents with  . Colonoscopy    HPI:   [name] is a 52 year old female who presents today for a visit prior to screening colonoscopy. This will be her first colonoscopy. Denies abdominal pain, rectal bleeding. Lost 50 lbs after starting By[place]. Good appetite. No N/V. No changes in bowel habits. Zantac for indigestion. Takes daily. No dysphagia. No FH of colon cancer. After review of notes from Dr. Ouida Sills, appears she has a hx of fatty liver. Last LFTs with only mild increase in transaminases: AST 38, ALT 43. Apparently, this is improved from prior results per PCP note. Last [place] of abdomen May 2011. Plts normal.   Past Medical History  Diagnosis Date  . Diabetes mellitus   . Thyroid disease   . Elevated cholesterol   . Fibromyalgia   . Anxiety   . Depression   . Bipolar disorder   . PTSD (post-traumatic stress disorder)     after death of son (accidental overdose)    Past Surgical History  Procedure Date  . Carpal tunnel release bilateral  . Vaginal hysterectomy   . Shoulder surgery left  . Gallbladder   . Knee arthroscopy     bilateral    Current Outpatient Prescriptions  Medication Sig Dispense Refill  . ARIPiprazole (ABILIFY) 20 MG tablet Take 15 mg by mouth daily.       Marland Kitchen aspirin 81 MG tablet Take 81 mg by mouth daily.      . diazepam (VALIUM) 10 MG tablet Take 10-20 mg by mouth 4 (four) times daily. Patient takes 1 tablet three times a day and 2 tablets at bedtime      . exenatide (BYETTA) 10 MCG/0.04ML SOLN Inject into the skin 2 (two) times daily with a meal. Pt takes 10 mcg twice daily      . FLUoxetine (PROZAC) 20 MG capsule Take 40 mg by mouth daily.       Marland Kitchen ibuprofen (ADVIL,MOTRIN) 800 MG tablet Take 1 tablet (800 mg total) by mouth every 8 (eight) hours as needed.  90 tablet  5  . levothyroxine (SYNTHROID, LEVOTHROID)  112 MCG tablet Take 112 mcg by mouth daily.      . metFORMIN (GLUCOPHAGE) 500 MG tablet Take 1,000 mg by mouth 2 (two) times daily.      . methocarbamol (ROBAXIN) 500 MG tablet Take 500 mg by mouth at bedtime.      . methylphenidate (RITALIN) 10 MG tablet Take 10 mg by mouth 3 (three) times daily.      . pregabalin (LYRICA) 75 MG capsule Take 75 mg by mouth at bedtime.      . ranitidine (ZANTAC) 150 MG tablet Take 150 mg by mouth 1 day or 1 dose.       . simvastatin (ZOCOR) 20 MG tablet Take 40 mg by mouth at bedtime.       . topiramate (TOPAMAX) 100 MG tablet Take 100 mg by mouth at bedtime.        Allergies as of 04/16/2012  . (No Known Allergies)    Family History  Problem Relation Age of Onset  . Cancer    . Diabetes    . Heart disease    . Arthritis    . Lung disease    . Colon cancer Neg Hx     History   Social History  . Marital Status:  Divorced    Spouse Name: N/A    Number of Children: N/A  . Years of Education: N/A   Occupational History  . unemployed    Social History Main Topics  . Smoking status: Current Every Day Smoker -- 0.5 packs/day  . Smokeless tobacco: Not on file  . Alcohol Use: No  . Drug Use: No  . Sexually Active: Not on file   Other Topics Concern  . Not on file   Social History Narrative  . No narrative on file    Review of Systems: Gen: SEE HPI CV: Denies chest pain, heart palpitations, syncope, peripheral edema. Resp: Denies shortness of breath with rest, cough, wheezing GI: SEE HPI GU : Denies urinary burning, urinary frequency, urinary incontinence.  MS: +arthritis Derm: +dry skin Psych: +Depression Heme: Denies bruising, bleeding, and enlarged lymph nodes.  Physical Exam: BP 116/71  Pulse 79  Temp 98.4 F (36.9 C) (Oral)  Ht 5' 8.5" (1.74 m)  Wt 219 lb 12.8 oz (99.701 kg)  BMI 32.93 kg/m2 General:   Alert and oriented. Well-developed, well-nourished, pleasant and cooperative. Head:  Normocephalic and  atraumatic. Eyes:  Conjunctiva pink, sclera clear, no icterus.   Conjunctiva pink. Ears:  Normal auditory acuity. Nose:  No deformity, discharge,  or lesions. Mouth:  No deformity or lesions, mucosa pink and moist.  Neck:  Supple, without mass or thyromegaly. Lungs:  Clear to auscultation bilaterally, without wheezing, rales, or rhonchi.  Heart:  S1, S2 present without murmurs noted.  Abdomen:  +BS, soft, non-tender and non-distended. Without mass or HSM. No rebound or guarding. No hernias noted. Rectal:  Deferred  Msk:  Symmetrical without gross deformities. Normal posture. Extremities:  Without clubbing or edema. Neurologic:  Alert and  oriented x4;  grossly normal neurologically. Skin:  Intact, warm and dry without significant lesions or rashes Cervical Nodes:  No significant cervical adenopathy. Psych:  Alert and cooperative. Normal mood and affect.

## 2012-04-16 NOTE — Patient Instructions (Addendum)
We have set you up for a colonoscopy with Dr. Darrick Penna in the near future.  I would also like to see you back in 6 months to recheck your liver numbers and see how you are doing. For a fatty liver, do the following:  Recommend 1-2# weight loss per week until ideal body weight through exercise & diet. Low fat/cholesterol diet.   Avoid sweets, sodas, fruit juices, sweetened beverages like tea, etc. Gradually increase exercise from 15 min daily up to 1 hr per day 5 days/week. Limit alcohol use.  Please see handout provided.         Fatty Liver Fatty liver is the accumulation of fat in liver cells. It is also called hepatosteatosis or steatohepatitis. It is normal for your liver to contain some fat. If fat is more than 5 to 10% of your liver's weight, you have fatty liver.   There are often no symptoms (problems) for years while damage is still occurring. People often learn about their fatty liver when they have medical tests for other reasons. Fat can damage your liver for years or even decades without causing problems. When it becomes severe, it can cause fatigue, weight loss, weakness, and confusion. This makes you more likely to develop more serious liver problems. The liver is the largest organ in the body. It does a lot of work and often gives no warning signs when it is sick until late in a disease. The liver has many important jobs including:  Breaking down foods.   Storing vitamins, iron, and other minerals.   Making proteins.   Making bile for food digestion.   Breaking down many products including medications, alcohol and some poisons.  CAUSES   There are a number of different conditions, medications, and poisons that can cause a fatty liver. Eating too many calories causes fat to build up in the liver. Not processing and breaking fats down normally may also cause this. Certain conditions, such as obesity, diabetes, and high triglycerides also cause this. Most fatty liver  patients tend to be middle-aged and over weight.   Some causes of fatty liver are:  Alcohol over consumption.   Malnutrition.   Steroid use.   Valproic acid toxicity.   Obesity.   Cushing's syndrome.   Poisons.   Tetracycline in high dosages.   Pregnancy.   Diabetes.   Hyperlipidemia.   Rapid weight loss.  Some people develop fatty liver even having none of these conditions. SYMPTOMS   Fatty liver most often causes no problems. This is called asymptomatic.  It can be diagnosed with blood tests and also by a liver biopsy.   It is one of the most common causes of minor elevations of liver enzymes on routine blood tests.   Specialized Imaging of the liver using ultrasound, CT (computed tomography) scan, or MRI (magnetic resonance imaging) can suggest a fatty liver but a biopsy is needed to confirm it.   A biopsy involves taking a small sample of liver tissue. This is done by using a needle. It is then looked at under a microscope by a specialist.  TREATMENT   It is important to treat the cause. Simple fatty liver without a medical reason may not need treatment.  Weight loss, fat restriction, and exercise in overweight patients produces inconsistent results but is worth trying.   Fatty liver due to alcohol toxicity may not improve even with stopping drinking.   Good control of diabetes may reduce fatty liver.   Lower your  triglycerides through diet, medication or both.   Eat a balanced, healthy diet.   Increase your physical activity.   Get regular checkups from a liver specialist.   There are no medical or surgical treatments for a fatty liver or NASH, but improving your diet and increasing your exercise may help prevent or reverse some of the damage.  PROGNOSIS   Fatty liver may cause no damage or it can lead to an inflammation of the liver. This is, called steatohepatitis. When it is linked to alcohol abuse, it is called alcoholic steatohepatitis. It often is  not linked to alcohol. It is then called nonalcoholic steatohepatitis, or NASH. Over time the liver may become scarred and hardened. This condition is called cirrhosis. Cirrhosis is serious and may lead to liver failure or cancer. NASH is one of the leading causes of cirrhosis. About 10-20% of Americans have fatty liver and a smaller 2-5% has NASH. Document Released: 04/22/2005 Document Revised: 05/30/2011 Document Reviewed: 06/15/2005 [facility] Patient Information 2013 [place], [place].       Fat and Cholesterol Control Diet Cholesterol levels in your body are determined significantly by your diet. Cholesterol levels may also be related to heart disease. The following material helps to explain this relationship and discusses what you can do to help keep your heart healthy. Not all cholesterol is bad. Low-density lipoprotein (LDL) cholesterol is the "bad" cholesterol. It may cause fatty deposits to build up inside your arteries. High-density lipoprotein (HDL) cholesterol is "good." It helps to remove the "bad" LDL cholesterol from your blood. Cholesterol is a very important risk factor for heart disease. Other risk factors are high blood pressure, smoking, stress, heredity, and weight. The heart muscle gets its supply of blood through the coronary arteries. If your LDL cholesterol is high and your HDL cholesterol is low, you are at risk for having fatty deposits build up in your coronary arteries. This leaves less room through which blood can flow. Without sufficient blood and oxygen, the heart muscle cannot function properly and you may feel chest pains (angina pectoris). When a coronary artery closes up entirely, a part of the heart muscle may die causing a heart attack (myocardial infarction). CHECKING CHOLESTEROL When your caregiver sends your blood to a lab to be examined for cholesterol, a complete lipid (fat) profile may be done. With this test, the total amount of cholesterol and levels of LDL and  HDL are determined. Triglycerides are a type of fat that circulates in the blood. They can also be used to determine heart disease risk. The list below describes what the numbers should be: Test: Total Cholesterol.  Less than 200 mg/dl.  Test: LDL "bad cholesterol."  Less than 100 mg/dl.   Less than 70 mg/dl if you are at very high risk of a heart attack or sudden cardiac death.  Test: HDL "good cholesterol."  Greater than 50 mg/dl for women.   Greater than 40 mg/dl for men.  Test: Triglycerides.  Less than 150 mg/dl.  CONTROLLING CHOLESTEROL WITH DIET Although exercise and lifestyle factors are important, your diet is key. That is because certain foods are known to raise cholesterol and others to lower it. The goal is to balance foods for their effect on cholesterol and more importantly, to replace saturated and trans fat with other types of fat, such as monounsaturated fat, polyunsaturated fat, and omega-3 fatty acids. On average, a person should consume no more than 15 to 17 g of saturated fat daily. Saturated and trans fats  are considered "bad" fats, and they will raise LDL cholesterol. Saturated fats are primarily found in animal products such as meats, butter, and cream. However, that does not mean you need to give up all your favorite foods. Today, there are good tasting, low-fat, low-cholesterol substitutes for most of the things you like to eat. Choose low-fat or nonfat alternatives. Choose round or loin cuts of red meat. These types of cuts are lowest in fat and cholesterol. Chicken (without the skin), fish, veal, and ground [place] breast are great choices. Eliminate fatty meats, such as hot dogs and salami. Even shellfish have little or no saturated fat. Have a 3 oz (85 g) portion when you eat lean meat, poultry, or fish. Trans fats are also called "partially hydrogenated oils." They are oils that have been scientifically manipulated so that they are solid at room temperature resulting  in a longer shelf life and improved taste and texture of foods in which they are added. Trans fats are found in stick margarine, some tub margarines, cookies, crackers, and baked goods.   When baking and cooking, oils are a great substitute for butter. The monounsaturated oils are especially beneficial since it is believed they lower LDL and raise HDL. The oils you should avoid entirely are saturated tropical oils, such as coconut and palm.   Remember to eat a lot from food groups that are naturally free of saturated and trans fat, including fish, fruit, vegetables, beans, grains (barley, rice, couscous, bulgur wheat), and pasta (without cream sauces).   IDENTIFYING FOODS THAT LOWER CHOLESTEROL   Soluble fiber may lower your cholesterol. This type of fiber is found in fruits such as apples, vegetables such as broccoli, potatoes, and carrots, legumes such as beans, peas, and lentils, and grains such as barley. Foods fortified with plant sterols (phytosterol) may also lower cholesterol. You should eat at least 2 g per day of these foods for a cholesterol lowering effect.   Read package labels to identify low-saturated fats, trans fat free, and low-fat foods at the supermarket. Select cheeses that have only 2 to 3 g saturated fat per ounce. Use a heart-healthy tub margarine that is free of trans fats or partially hydrogenated oil. When buying baked goods (cookies, crackers), avoid partially hydrogenated oils. Breads and muffins should be made from whole grains (whole-wheat or whole oat flour, instead of "flour" or "enriched flour"). Buy non-creamy canned soups with reduced salt and no added fats.   FOOD PREPARATION TECHNIQUES   Never deep-fry. If you must fry, either stir-fry, which uses very little fat, or use non-stick cooking sprays. When possible, broil, bake, or roast meats, and steam vegetables. Instead of putting butter or margarine on vegetables, use lemon and herbs, applesauce, and cinnamon (for squash  and sweet potatoes), nonfat yogurt, salsa, and low-fat dressings for salads.   LOW-SATURATED FAT / LOW-FAT FOOD SUBSTITUTES Meats / Saturated Fat (g)  Avoid: Steak, marbled (3 oz/85 g) / 11 g   Choose: Steak, lean (3 oz/85 g) / 4 g   Avoid: Hamburger (3 oz/85 g) / 7 g   Choose: Hamburger, lean (3 oz/85 g) / 5 g   Avoid: Ham (3 oz/85 g) / 6 g   Choose: Ham, lean cut (3 oz/85 g) / 2.4 g   Avoid: Chicken, with skin, dark meat (3 oz/85 g) / 4 g   Choose: Chicken, skin removed, dark meat (3 oz/85 g) / 2 g   Avoid: Chicken, with skin, light meat (3 oz/85 g) /  2.5 g   Choose: Chicken, skin removed, light meat (3 oz/85 g) / 1 g  Dairy / Saturated Fat (g)  Avoid: Whole milk (1 cup) / 5 g   Choose: Low-fat milk, 2% (1 cup) / 3 g   Choose: Low-fat milk, 1% (1 cup) / 1.5 g   Choose: Skim milk (1 cup) / 0.3 g   Avoid: Hard cheese (1 oz/28 g) / 6 g   Choose: Skim milk cheese (1 oz/28 g) / 2 to 3 g   Avoid: Cottage cheese, 4% fat (1 cup) / 6.5 g   Choose: Low-fat cottage cheese, 1% fat (1 cup) / 1.5 g   Avoid: Ice cream (1 cup) / 9 g   Choose: Sherbet (1 cup) / 2.5 g   Choose: Nonfat frozen yogurt (1 cup) / 0.3 g   Choose: Frozen fruit bar / trace   Avoid: Whipped cream (1 tbs) / 3.5 g   Choose: Nondairy whipped topping (1 tbs) / 1 g  Condiments / Saturated Fat (g)  Avoid: Mayonnaise (1 tbs) / 2 g   Choose: Low-fat mayonnaise (1 tbs) / 1 g   Avoid: Butter (1 tbs) / 7 g   Choose: Extra light margarine (1 tbs) / 1 g   Avoid: Coconut oil (1 tbs) / 11.8 g   Choose: Olive oil (1 tbs) / 1.8 g   Choose: Corn oil (1 tbs) / 1.7 g   Choose: Safflower oil (1 tbs) / 1.2 g   Choose: Sunflower oil (1 tbs) / 1.4 g   Choose: Soybean oil (1 tbs) / 2.4 g   Choose: Canola oil (1 tbs) / 1 g  Document Released: 03/07/2005 Document Revised: 05/30/2011 Document Reviewed: 08/26/2010 [facility] Patient Information 2013 [place], [place].

## 2012-04-19 LAB — BASIC METABOLIC PANEL
ALT: 43 U/L — AB (ref 7–35)
AST: 38 U/L
Albumin: 4.8
Alkaline Phosphatase: 64 U/L
Calcium: 9.9 mg/dL
Total Bilirubin: 0.5 mg/dL
Total Protein: 7.5 g/dL

## 2012-04-24 ENCOUNTER — Other Ambulatory Visit (HOSPITAL_COMMUNITY): Payer: Medicaid Other

## 2012-04-24 ENCOUNTER — Other Ambulatory Visit (HOSPITAL_COMMUNITY): Payer: Self-pay | Admitting: Internal Medicine

## 2012-04-24 DIAGNOSIS — Z139 Encounter for screening, unspecified: Secondary | ICD-10-CM

## 2012-04-24 NOTE — Patient Instructions (Addendum)
[place] [facility]  04/24/2012   Your procedure is scheduled on:  05/01/2012  Report to [facility] at  615  AM.  Call this number if you have problems the morning of surgery: [phone]   Remember:   Do not eat food or drink liquids after midnight.   Take these medicines the morning of surgery with A SIP OF WATER: abilify,valium,prozac,synthroid,topamax,ritalin,lyrica,zantac   Do not wear jewelry, make-up or nail polish.  Do not wear lotions, powders, or perfumes.   Do not shave 48 hours prior to surgery. Men may shave face and neck.  Do not bring valuables to the hospital.  Contacts, dentures or bridgework may not be worn into surgery.  Leave suitcase in the car. After surgery it may be brought to your room.  For patients admitted to the hospital, checkout time is 11:00 AM the day of discharge.   Patients discharged the day of surgery will not be allowed to drive  home.  Name and phone number of your driver: family  Special Instructions: N/A   Please read over the following fact sheets that you were given: Pain Booklet, Coughing and Deep Breathing, Surgical Site Infection Prevention, Anesthesia Post-op Instructions and Care and Recovery After Surgery Colonoscopy A colonoscopy is an exam to evaluate your entire colon. In this exam, your colon is cleansed. A long fiberoptic tube is inserted through your rectum and into your colon. The fiberoptic scope (endoscope) is a long bundle of enclosed and very flexible fibers. These fibers transmit light to the area examined and send images from that area to your caregiver. Discomfort is usually minimal. You may be given a drug to help you sleep (sedative) during or prior to the procedure. This exam helps to detect lumps (tumors), polyps, inflammation, and areas of bleeding. Your caregiver may also take a small piece of tissue (biopsy) that will be examined under a microscope. LET YOUR CAREGIVER KNOW ABOUT:   Allergies to food or  medicine.  Medicines taken, including vitamins, herbs, eyedrops, over-the-counter medicines, and creams.  Use of steroids (by mouth or creams).  Previous problems with anesthetics or numbing medicines.  History of bleeding problems or blood clots.  Previous surgery.  Other health problems, including diabetes and kidney problems.  Possibility of pregnancy, if this applies. BEFORE THE PROCEDURE   A clear liquid diet may be required for 2 days before the exam.  Ask your caregiver about changing or stopping your regular medications.  Liquid injections (enemas) or laxatives may be required.  A large amount of electrolyte solution may be given to you to drink over a short period of time. This solution is used to clean out your colon.  You should be present 60 minutes prior to your procedure or as directed by your caregiver. AFTER THE PROCEDURE   If you received a sedative or pain relieving medication, you will need to arrange for someone to drive you home.  Occasionally, there is a little blood passed with the first bowel movement. Do not be concerned. FINDING OUT THE RESULTS OF YOUR TEST Not all test results are available during your visit. If your test results are not back during the visit, make an appointment with your caregiver to find out the results. Do not assume everything is normal if you have not heard from your caregiver or the medical facility. It is important for you to follow up on all of your test results. HOME CARE INSTRUCTIONS   It is not unusual  to pass moderate amounts of gas and experience mild abdominal cramping following the procedure. This is due to air being used to inflate your colon during the exam. Walking or a warm pack on your belly (abdomen) may help.  You may resume all normal meals and activities after sedatives and medicines have worn off.  Only take over-the-counter or prescription medicines for pain, discomfort, or fever as directed by your  caregiver. Do not use aspirin or blood thinners if a biopsy was taken. Consult your caregiver for medicine usage if biopsies were taken. SEEK IMMEDIATE MEDICAL CARE IF:   You have a fever.  You pass large blood clots or fill a toilet with blood following the procedure. This may also occur 10 to 14 days following the procedure. This is more likely if a biopsy was taken.  You develop abdominal pain that keeps getting worse and cannot be relieved with medicine. Document Released: 03/04/2000 Document Revised: 05/30/2011 Document Reviewed: 10/18/2007 [facility] Patient Information 2013 [place], [place]. PATIENT INSTRUCTIONS POST-ANESTHESIA  IMMEDIATELY FOLLOWING SURGERY:  Do not drive or operate machinery for the first twenty four hours after surgery.  Do not make any important decisions for twenty four hours after surgery or while taking narcotic pain medications or sedatives.  If you develop intractable nausea and vomiting or a severe headache please notify your doctor immediately.  FOLLOW-UP:  Please make an appointment with your surgeon as instructed. You do not need to follow up with anesthesia unless specifically instructed to do so.  WOUND CARE INSTRUCTIONS (if applicable):  Keep a dry clean dressing on the anesthesia/puncture wound site if there is drainage.  Once the wound has quit draining you may leave it open to air.  Generally you should leave the bandage intact for twenty four hours unless there is drainage.  If the epidural site drains for more than 36-48 hours please call the anesthesia department.  QUESTIONS?:  Please feel free to call your physician or the hospital operator if you have any questions, and they will be happy to assist you.

## 2012-04-25 ENCOUNTER — Telehealth: Payer: Self-pay | Admitting: Gastroenterology

## 2012-04-25 ENCOUNTER — Encounter (HOSPITAL_COMMUNITY)
Admission: RE | Admit: 2012-04-25 | Discharge: 2012-04-25 | Disposition: A | Discharge status: Home / Self Care | Payer: Medicaid Other | Source: Ambulatory Visit | Attending: Gastroenterology | Admitting: Gastroenterology

## 2012-04-25 ENCOUNTER — Encounter (HOSPITAL_COMMUNITY): Payer: Self-pay

## 2012-04-25 HISTORY — DX: Gastro-esophageal reflux disease without esophagitis: K21.9

## 2012-04-25 LAB — BASIC METABOLIC PANEL
BUN: 8 mg/dL (ref 6–23)
CO2: 22 mEq/L (ref 19–32)
Calcium: 9.2 mg/dL (ref 8.4–10.5)
Chloride: 104 mEq/L (ref 96–112)
Creatinine, Ser: 0.7 mg/dL (ref 0.50–1.10)
GFR calc Af Amer: >90 mL/min (ref >90)
GFR calc non Af Amer: >90 mL/min (ref >90)
Glucose, Bld: 223 mg/dL — ABNORMAL HIGH (ref 70–99)
Potassium: 3.7 mEq/L (ref 3.5–5.1)
Sodium: 138 mEq/L (ref 135–145)

## 2012-04-25 LAB — HEMOGLOBIN AND HEMATOCRIT, BLOOD
HCT: 41.8 % (ref 36.0–46.0)
Hemoglobin: 14.6 g/dL (ref 12.0–15.0)

## 2012-04-25 NOTE — Telephone Encounter (Signed)
PLEASE CALL PT. HER LABS ARE NORMAL EXCEPT HR GLUCOSE IN 223.

## 2012-04-26 ENCOUNTER — Telehealth: Payer: Self-pay | Admitting: *Deleted

## 2012-04-26 NOTE — Telephone Encounter (Signed)
PLEASE CALL PT.  SHE MAY CONTINUE HER ASA.

## 2012-04-26 NOTE — Telephone Encounter (Signed)
[name] called today to find out if she should continue to take her aspirin prior to her TCS next week. Please advise. Thank you.

## 2012-04-26 NOTE — Telephone Encounter (Signed)
Pt aware.

## 2012-04-26 NOTE — Telephone Encounter (Signed)
Informed pt that it is no longer normal procedure to stop ASA prior to colonoscopy.

## 2012-05-01 ENCOUNTER — Encounter (HOSPITAL_COMMUNITY): Payer: Self-pay | Admitting: Anesthesiology

## 2012-05-01 ENCOUNTER — Ambulatory Visit (HOSPITAL_COMMUNITY)
Admission: RE | Admit: 2012-05-01 | Discharge: 2012-05-01 | Disposition: A | Discharge status: Home / Self Care | Payer: Medicaid Other | Source: Ambulatory Visit | Attending: Gastroenterology | Admitting: Gastroenterology

## 2012-05-01 ENCOUNTER — Ambulatory Visit (HOSPITAL_COMMUNITY): Payer: Medicaid Other | Admitting: Anesthesiology

## 2012-05-01 ENCOUNTER — Encounter (HOSPITAL_COMMUNITY): Payer: Self-pay | Admitting: *Deleted

## 2012-05-01 ENCOUNTER — Encounter (HOSPITAL_COMMUNITY)
Admission: RE | Disposition: A | Discharge status: Home / Self Care | Payer: Self-pay | Source: Ambulatory Visit | Attending: Gastroenterology

## 2012-05-01 DIAGNOSIS — D126 Benign neoplasm of colon, unspecified: Secondary | ICD-10-CM

## 2012-05-01 DIAGNOSIS — K5730 Diverticulosis of large intestine without perforation or abscess without bleeding: Secondary | ICD-10-CM

## 2012-05-01 DIAGNOSIS — Z1211 Encounter for screening for malignant neoplasm of colon: Secondary | ICD-10-CM

## 2012-05-01 DIAGNOSIS — Z01812 Encounter for preprocedural laboratory examination: Secondary | ICD-10-CM | POA: Insufficient documentation

## 2012-05-01 DIAGNOSIS — E119 Type 2 diabetes mellitus without complications: Secondary | ICD-10-CM | POA: Insufficient documentation

## 2012-05-01 DIAGNOSIS — K648 Other hemorrhoids: Secondary | ICD-10-CM | POA: Insufficient documentation

## 2012-05-01 HISTORY — PX: COLONOSCOPY WITH PROPOFOL: SHX5780

## 2012-05-01 HISTORY — PX: POLYPECTOMY: SHX5525

## 2012-05-01 LAB — GLUCOSE, CAPILLARY
Glucose-Capillary: 143 mg/dL — ABNORMAL HIGH (ref 70–99)
Glucose-Capillary: 106 mg/dL — ABNORMAL HIGH (ref 70–99)

## 2012-05-01 SURGERY — COLONOSCOPY WITH PROPOFOL
Anesthesia: Monitor Anesthesia Care

## 2012-05-01 MED ORDER — PROPOFOL 10 MG/ML IV BOLUS
INTRAVENOUS | Status: DC | PRN
  Administered 2012-05-01 (×2): 10 mg via INTRAVENOUS

## 2012-05-01 MED ORDER — LACTATED RINGERS IV SOLN
INTRAVENOUS | Status: DC
  Administered 2012-05-01: 07:00:00 via INTRAVENOUS

## 2012-05-01 MED ORDER — MIDAZOLAM HCL 2 MG/2ML IJ SOLN
INTRAMUSCULAR | Status: AC
  Filled 2012-05-01: qty 2

## 2012-05-01 MED ORDER — ONDANSETRON HCL 4 MG/2ML IJ SOLN
INTRAMUSCULAR | Status: AC
  Filled 2012-05-01: qty 2

## 2012-05-01 MED ORDER — PROPOFOL INFUSION 10 MG/ML OPTIME
INTRAVENOUS | Status: DC | PRN
  Administered 2012-05-01: 08:00:00 via INTRAVENOUS
  Administered 2012-05-01: 150 ug/kg/min via INTRAVENOUS

## 2012-05-01 MED ORDER — FENTANYL CITRATE 0.05 MG/ML IJ SOLN
INTRAMUSCULAR | Status: DC | PRN
  Administered 2012-05-01: 50 ug via INTRAVENOUS

## 2012-05-01 MED ORDER — FENTANYL CITRATE 0.05 MG/ML IJ SOLN: 25.0000 ug | INTRAMUSCULAR | Status: DC | PRN

## 2012-05-01 MED ORDER — GLYCOPYRROLATE 0.2 MG/ML IJ SOLN
INTRAMUSCULAR | Status: AC
  Filled 2012-05-01: qty 1

## 2012-05-01 MED ORDER — WATER FOR IRRIGATION, STERILE IR SOLN
Status: DC | PRN
  Administered 2012-05-01: 1000 mL

## 2012-05-01 MED ORDER — ONDANSETRON HCL 4 MG/2ML IJ SOLN: 4.0000 mg | Freq: Once | INTRAMUSCULAR | Status: DC | PRN

## 2012-05-01 MED ORDER — MIDAZOLAM HCL 2 MG/2ML IJ SOLN
1.0000 mg | INTRAMUSCULAR | Status: DC | PRN
  Administered 2012-05-01: 2 mg via INTRAVENOUS

## 2012-05-01 MED ORDER — STERILE WATER FOR IRRIGATION IR SOLN
Status: DC | PRN
  Administered 2012-05-01: 07:00:00

## 2012-05-01 MED ORDER — ONDANSETRON HCL 4 MG/2ML IJ SOLN
4.0000 mg | Freq: Once | INTRAMUSCULAR | Status: AC
  Administered 2012-05-01: 4 mg via INTRAVENOUS

## 2012-05-01 MED ORDER — PROPOFOL 10 MG/ML IV EMUL
INTRAVENOUS | Status: AC
  Filled 2012-05-01: qty 40

## 2012-05-01 MED ORDER — MIDAZOLAM HCL 5 MG/5ML IJ SOLN
INTRAMUSCULAR | Status: DC | PRN
  Administered 2012-05-01: 2 mg via INTRAVENOUS

## 2012-05-01 MED ORDER — GLYCOPYRROLATE 0.2 MG/ML IJ SOLN
0.2000 mg | Freq: Once | INTRAMUSCULAR | Status: AC
  Administered 2012-05-01: 0.2 mg via INTRAVENOUS

## 2012-05-01 MED ORDER — FENTANYL CITRATE 0.05 MG/ML IJ SOLN
INTRAMUSCULAR | Status: AC
  Filled 2012-05-01: qty 2

## 2012-05-01 MED ORDER — LIDOCAINE HCL (PF) 1 % IJ SOLN
INTRAMUSCULAR | Status: AC
  Filled 2012-05-01: qty 5

## 2012-05-01 SURGICAL SUPPLY — 22 items
ELECT REM PT RETURN 9FT ADLT (ELECTROSURGICAL)
ELECTRODE REM PT RTRN 9FT ADLT (ELECTROSURGICAL) IMPLANT
FCP BXJMBJMB 240X2.8X (CUTTING FORCEPS)
FLOOR PAD 36X40 (MISCELLANEOUS) ×2
FORCEPS BIOP RAD 4 LRG CAP 4 (CUTTING FORCEPS) IMPLANT
FORCEPS BIOP RJ4 240 W/NDL (CUTTING FORCEPS)
FORCEPS BXJMBJMB 240X2.8X (CUTTING FORCEPS) IMPLANT
INJECTOR/SNARE I SNARE (MISCELLANEOUS) IMPLANT
LUBRICANT JELLY 4.5OZ STERILE (MISCELLANEOUS) ×1 IMPLANT
MANIFOLD NEPTUNE II (INSTRUMENTS) ×1 IMPLANT
NDL SCLEROTHERAPY 25GX240 (NEEDLE) IMPLANT
NEEDLE SCLEROTHERAPY 25GX240 (NEEDLE) IMPLANT
PAD FLOOR 36X40 (MISCELLANEOUS) ×1 IMPLANT
PROBE APC STR FIRE (PROBE) IMPLANT
PROBE INJECTION GOLD (MISCELLANEOUS)
PROBE INJECTION GOLD 7FR (MISCELLANEOUS) IMPLANT
SNARE SHORT THROW 13M SML OVAL (MISCELLANEOUS) ×2 IMPLANT
SYR 50ML LL SCALE MARK (SYRINGE) ×1 IMPLANT
TRAP SPECIMEN MUCOUS 40CC (MISCELLANEOUS) IMPLANT
TUBING ENDO SMARTCAP PENTAX (MISCELLANEOUS) IMPLANT
TUBING IRRIGATION ENDOGATOR (MISCELLANEOUS) IMPLANT
WATER STERILE IRR 1000ML POUR (IV SOLUTION) ×2 IMPLANT

## 2012-05-01 NOTE — Progress Notes (Signed)
Arousable. Oriented to place per nurse. Encouraged to pass air. Voiced understanding.

## 2012-05-01 NOTE — Op Note (Signed)
[facility] [address] [place], [address]   COLONOSCOPY PROCEDURE REPORT  PATIENT: Alayla, Dethlefs  MR#: [id number] BIRTHDATE: March 25, 1960 , 51  yrs. old GENDER: Female ENDOSCOPIST: Jonette Eva, MD REFERRED Raphael Gibney, M.D. PROCEDURE DATE:  05/01/2012 PROCEDURE:   Colonoscopy with cold biopsy polypectomy INDICATIONS:Average risk patient for colon cancer. MEDICATIONS: MAC sedation, administered by CRNA  DESCRIPTION OF PROCEDURE:    Physical exam was performed.  Informed consent was obtained from the patient after explaining the benefits, risks, and alternatives to procedure.  The patient was connected to monitor and placed in left lateral position. Continuous oxygen was provided by nasal cannula and IV medicine administered through an indwelling cannula.  After administration of sedation and rectal exam, the patients rectum was intubated and the     colonoscope was advanced under direct visualization to the cecum.  The scope was removed slowly by carefully examining the color, texture, anatomy, and integrity mucosa on the way out.  The patient was recovered in endoscopy and discharged home in satisfactory condition.    COLON FINDINGS: Three sessile polyps ranging between 3-3mm in size were found at the hepatic flexure, in the proximal transverse colon, and sigmoid colon.  A polypectomy was performed with cold forceps.  , Mild diverticulosis was noted in the sigmoid colon.  , The colon was redundant.  Manual abdominal counter-pressure was used to reach the cecum, and Moderate sized internal hemorrhoids were found.  PREP QUALITY: good. CECAL W/D TIME: 21 minutes COMPLICATIONS: None  ENDOSCOPIC IMPRESSION: 1.   Three COLON polyps 2.   Mild diverticulosis was noted in the sigmoid colon 3.   Moderate sized internal hemorrhoids  RECOMMENDATIONS: AWAIT BIOPSY HIGH IFBER DIET CONITNUE WEIGHT LOSS EFFORTS TCS IN 10 YEARS WITH PROPOFOL AND  OVERTUBE       _______________________________ eSignedJonette Eva, MD 05/01/2012 8:39 AM     PATIENT NAME:  Tomasa Rand MR#: [id number]

## 2012-05-01 NOTE — Transfer of Care (Signed)
Immediate Anesthesia Transfer of Care Note  Patient: [name]  Procedure(s) Performed: Procedure(s) with comments: COLONOSCOPY WITH PROPOFOL (N/A) - in cecum at 0746; total withdrawal time 22 minutes POLYPECTOMY (N/A)  Patient Location: PACU  Anesthesia Type:MAC  Level of Consciousness: awake, alert  and oriented  Airway & Oxygen Therapy: Patient Spontanous Breathing  Post-op Assessment: Report given to PACU RN  Post vital signs: Reviewed and stable  Complications: No apparent anesthesia complications

## 2012-05-01 NOTE — Anesthesia Preprocedure Evaluation (Signed)
Anesthesia Evaluation  Patient identified by MRN, date of birth, ID band Patient awake    Reviewed: Allergy & Precautions, H&P , NPO status , Patient's Chart, lab work & pertinent test results  History of Anesthesia Complications Negative for: history of anesthetic complications  Airway Mallampati: II      Dental  (+) Teeth Intact   Pulmonary Current Smoker, PE: am cough. breath sounds clear to auscultation        Cardiovascular negative cardio ROS  Rhythm:Regular Rate:Normal     Neuro/Psych PSYCHIATRIC DISORDERS Anxiety Depression    GI/Hepatic GERD-  ,  Endo/Other  diabetes, Type 2, Oral Hypoglycemic AgentsMorbid obesity  Renal/GU      Musculoskeletal  (+) Fibromyalgia -  Abdominal   Peds  Hematology   Anesthesia Other Findings   Reproductive/Obstetrics                           Anesthesia Physical Anesthesia Plan  ASA: III  Anesthesia Plan: MAC   Post-op Pain Management:    Induction: Intravenous  Airway Management Planned: Simple Face Mask  Additional Equipment:   Intra-op Plan:   Post-operative Plan:   Informed Consent: I have reviewed the patients History and Physical, chart, labs and discussed the procedure including the risks, benefits and alternatives for the proposed anesthesia with the patient or authorized representative who has indicated his/her understanding and acceptance.     Plan Discussed with:   Anesthesia Plan Comments:         Anesthesia Quick Evaluation

## 2012-05-01 NOTE — Anesthesia Postprocedure Evaluation (Signed)
  Anesthesia Post-op Note  Patient: [name]  Procedure(s) Performed: Procedure(s) with comments: COLONOSCOPY WITH PROPOFOL (N/A) - in cecum at 0746; total withdrawal time 22 minutes POLYPECTOMY (N/A)  Patient Location: PACU  Anesthesia Type:MAC  Level of Consciousness: awake, alert  and oriented  Airway and Oxygen Therapy: Patient Spontanous Breathing and Patient connected to face mask oxygen  Post-op Pain: none  Post-op Assessment: Post-op Vital signs reviewed, Patient's Cardiovascular Status Stable, Respiratory Function Stable, Patent Airway and No signs of Nausea or vomiting  Post-op Vital Signs: Reviewed and stable  Complications: No apparent anesthesia complications

## 2012-05-01 NOTE — H&P (Signed)
Primary Care Physician:  Carylon Perches, MD Primary Gastroenterologist:  Dr. Darrick Penna  Pre-Procedure History & Physical: HPI:  [name] is a 52 y.o. female here for COLON CANCER SCREENING.   Past Medical History  Diagnosis Date  . Diabetes mellitus   . Thyroid disease   . Elevated cholesterol   . Fibromyalgia   . Anxiety   . Depression   . Bipolar disorder   . PTSD (post-traumatic stress disorder)     after death of son (accidental overdose)  . GERD (gastroesophageal reflux disease)     Past Surgical History  Procedure Laterality Date  . Carpal tunnel release  bilateral  . Vaginal hysterectomy    . Shoulder surgery  left  . Gallbladder    . Knee arthroscopy      bilateral  . Cholecystectomy      Prior to Admission medications   Medication Sig Start Date End Date Taking? Authorizing Provider  ARIPiprazole (ABILIFY) 20 MG tablet Take 15 mg by mouth daily.    Yes Historical Provider, MD  aspirin 81 MG tablet Take 81 mg by mouth daily.   Yes Historical Provider, MD  diazepam (VALIUM) 10 MG tablet Take 10-20 mg by mouth 4 (four) times daily. Patient takes 1 tablet three times a day and 2 tablets at bedtime   Yes Historical Provider, MD  exenatide (BYETTA) 10 MCG/0.04ML SOLN Inject into the skin 2 (two) times daily with a meal. Pt takes 10 mcg twice daily   Yes Historical Provider, MD  FLUoxetine (PROZAC) 20 MG capsule Take 40 mg by mouth daily.    Yes Historical Provider, MD  ibuprofen (ADVIL,MOTRIN) 800 MG tablet Take 1 tablet (800 mg total) by mouth every 8 (eight) hours as needed. 07/26/11  Yes Vickki Hearing, MD  levothyroxine (SYNTHROID, LEVOTHROID) 112 MCG tablet Take 112 mcg by mouth daily.   Yes Historical Provider, MD  metFORMIN (GLUCOPHAGE) 500 MG tablet Take 1,000 mg by mouth 2 (two) times daily.   Yes Historical Provider, MD  methocarbamol (ROBAXIN) 500 MG tablet Take 500 mg by mouth at bedtime.   Yes Historical Provider, MD  methylphenidate (RITALIN) 10 MG  tablet Take 10 mg by mouth 3 (three) times daily.   Yes Historical Provider, MD  polyethylene glycol-electrolytes (TRILYTE) 420 G solution Take 4,000 mLs by mouth as directed. 04/16/12  Yes West Bali, MD  pregabalin (LYRICA) 75 MG capsule Take 75 mg by mouth at bedtime.   Yes Historical Provider, MD  ranitidine (ZANTAC) 150 MG tablet Take 150 mg by mouth 1 day or 1 dose.    Yes Historical Provider, MD  simvastatin (ZOCOR) 20 MG tablet Take 40 mg by mouth at bedtime.    Yes Historical Provider, MD  topiramate (TOPAMAX) 100 MG tablet Take 100 mg by mouth at bedtime.   Yes Historical Provider, MD    Allergies as of 04/16/2012  . (No Known Allergies)    Family History  Problem Relation Age of Onset  . Cancer    . Diabetes    . Heart disease    . Arthritis    . Lung disease    . Colon cancer Neg Hx   . Stroke Mother   . Cancer - Lung Father     History   Social History  . Marital Status: Divorced    Spouse Name: N/A    Number of Children: N/A  . Years of Education: N/A   Occupational History  . unemployed  Social History Main Topics  . Smoking status: Current Every Day Smoker -- 0.50 packs/day for 3 years  . Smokeless tobacco: Not on file  . Alcohol Use: No  . Drug Use: No  . Sexually Active: Not on file   Other Topics Concern  . Not on file   Social History Narrative  . No narrative on file    Review of Systems: See HPI, otherwise negative ROS   Physical Exam: BP 121/66  Pulse 77  Temp(Src) 98.4 F (36.9 C) (Oral)  Resp 15  Ht 5' 6.5" (1.689 m)  Wt 216 lb (97.977 kg)  BMI 34.35 kg/m2  SpO2 99% General:   Alert,  pleasant and cooperative in NAD Head:  Normocephalic and atraumatic. Neck:  Supple; Lungs:  Clear throughout to auscultation.    Heart:  Regular rate and rhythm. Abdomen:  Soft, nontender and nondistended. Normal bowel sounds, without guarding, and without rebound.   Neurologic:  Alert and  oriented x4;  grossly normal  neurologically.  Impression/Plan:     SCREENING  Plan:  1. TCS TODAY

## 2012-05-03 ENCOUNTER — Encounter (HOSPITAL_COMMUNITY): Payer: Self-pay | Admitting: Gastroenterology

## 2012-05-10 ENCOUNTER — Telehealth: Payer: Self-pay | Admitting: Gastroenterology

## 2012-05-10 NOTE — Telephone Encounter (Signed)
Please call pt. She had simple adenomas removed from her colon.  FOLLOW A HIGH FIBER DIET. AVOID ITEMS THAT CAUSE BLOATING.  Next colonoscopy in 10 years.

## 2012-05-10 NOTE — Telephone Encounter (Signed)
Path faxed to PCP, recall made 

## 2012-05-10 NOTE — Telephone Encounter (Signed)
Called and informed pt.  

## 2012-05-16 ENCOUNTER — Encounter: Payer: Self-pay | Admitting: Orthopedic Surgery

## 2012-05-16 ENCOUNTER — Ambulatory Visit (INDEPENDENT_AMBULATORY_CARE_PROVIDER_SITE_OTHER): Payer: Medicaid Other | Admitting: Orthopedic Surgery

## 2012-05-16 VITALS — BP 110/70 | Ht 66.5 in | Wt 216.0 lb

## 2012-05-16 DIAGNOSIS — M1710 Unilateral primary osteoarthritis, unspecified knee: Secondary | ICD-10-CM

## 2012-05-16 DIAGNOSIS — M4800 Spinal stenosis, site unspecified: Secondary | ICD-10-CM

## 2012-05-16 DIAGNOSIS — IMO0002 Reserved for concepts with insufficient information to code with codable children: Secondary | ICD-10-CM

## 2012-05-16 DIAGNOSIS — M7050 Other bursitis of knee, unspecified knee: Secondary | ICD-10-CM | POA: Insufficient documentation

## 2012-05-16 NOTE — Patient Instructions (Addendum)
You have received a steroid shot. 15% of patients experience increased pain at the injection site with in the next 24 hours. This is best treated with ice and tylenol extra strength 2 tabs every 8 hours. If you are still having pain please call the office.    

## 2012-05-16 NOTE — Progress Notes (Signed)
Patient ID: [name], female   DOB: [birth year], 52 y.o.   MRN: [id number] Chief Complaint  Patient presents with  . Follow-up    Bilateral knee pain. Xrays at [facility].   BP 110/70  Ht 5' 6.5" (1.689 m)  Wt 216 lb (97.977 kg)  BMI 34.35 kg/m2 OA (osteoarthritis) of knee  Spinal stenosis   The patient comes in today complaining of increased knee pain bilaterally worse on the left than the right she had a previous knee arthroscopy she's had some spinal stenosis. She's been to lose about 30 pounds wants to continue her ambulation and walking for exercise and is having difficulty secondary to the increased knee pain. The pain is dull and aching its bilateral its medial and somewhat lateral on the left side of the medial bilaterally. It's worse with activity it is relieved somewhat by rest. No catching locking or giving way  Review of systems for spinal stenosis appears to be stable with no neurologic symptoms at this time no specific gait abnormality. Vascular symptoms negative. Weight loss planned. No fever no chills.  Reexamination BP 110/70  Ht 5' 6.5" (1.689 m)  Wt 216 lb (97.977 kg)  BMI 34.35 kg/m2 General appearance the patient has lost weight appearance otherwise normal she is oriented x3 mood and affect are normal ambulation appears to be slowed and with decreased flexion during the stance phase of gait.  Right knee tenderness over the pes anserine bursa range of motion full stability and strength normal skin intact without rash  Left knee lateral joint linetenderness pes bursa bursal tenderness flexion 115 knee stable strength normal skin intact.  Impression bilateral pes anserine bursitis Osteoarthritis left knee  Bilateral knee films were reviewed and the reports of each were reviewed and the films were reviewed  Impression mild arthritis both knees by my interpretation  Recommend bilateral pes bursa stream bursal injections with left knee joint  injection  Total of 3 injections were performed  Knee bilateral  Injection Procedure Note  Pre-operative Diagnosis: left knee oa  Post-operative Diagnosis: same  Indications: pain  Anesthesia: ethyl chloride   Procedure Details   Verbal consent was obtained for the procedure. Time out was completed.The joint was prepped with alcohol, followed by  Ethyl chloride spray and A 20 gauge needle was inserted into the knee via lateral approach; 4ml 1% lidocaine and 1 ml of depomedrol  was then injected into the joint . The needle was removed and the area cleansed and dressed.  Complications:  None; patient tolerated the procedure well.  Knee  Injection Procedure Note  Pre-operative Diagnosis: bilateral  knee bursitis  Post-operative Diagnosis: same  Indications: pain  Anesthesia: ethyl chloride   Procedure Details   Verbal consent was obtained for the procedure. Time out was completed.The bursa  was prepped with alcohol, followed by  Ethyl chloride spray and A 25 gauge needle was inserted into the pes bursa via lateral approach; 4ml 1% lidocaine and 1 ml of depomedrol  was then injected into the joint . The needle was removed and the area cleansed and dressed.  Complications:  None; patient tolerated the procedure well.  This was repeated in the right knee

## 2012-05-17 ENCOUNTER — Ambulatory Visit: Payer: Medicaid Other | Admitting: Orthopedic Surgery

## 2012-05-31 ENCOUNTER — Other Ambulatory Visit: Payer: Self-pay | Admitting: Orthopedic Surgery

## 2012-05-31 MED ORDER — HYDROCODONE-ACETAMINOPHEN 5-325 MG PO TABS: 1.0000 | ORAL_TABLET | Freq: Four times a day (QID) | ORAL | Status: DC | PRN

## 2012-06-04 ENCOUNTER — Ambulatory Visit (HOSPITAL_COMMUNITY): Payer: Medicaid Other

## 2012-06-26 ENCOUNTER — Encounter (HOSPITAL_COMMUNITY): Payer: Self-pay | Admitting: *Deleted

## 2012-06-26 ENCOUNTER — Emergency Department (HOSPITAL_COMMUNITY)
Admission: EM | Admit: 2012-06-26 | Discharge: 2012-06-26 | Disposition: A | Discharge status: Home / Self Care | Payer: Medicaid Other | Attending: Emergency Medicine | Admitting: Emergency Medicine

## 2012-06-26 DIAGNOSIS — Z7982 Long term (current) use of aspirin: Secondary | ICD-10-CM | POA: Insufficient documentation

## 2012-06-26 DIAGNOSIS — F411 Generalized anxiety disorder: Secondary | ICD-10-CM | POA: Insufficient documentation

## 2012-06-26 DIAGNOSIS — Z79899 Other long term (current) drug therapy: Secondary | ICD-10-CM | POA: Insufficient documentation

## 2012-06-26 DIAGNOSIS — Z8659 Personal history of other mental and behavioral disorders: Secondary | ICD-10-CM | POA: Insufficient documentation

## 2012-06-26 DIAGNOSIS — H538 Other visual disturbances: Secondary | ICD-10-CM | POA: Insufficient documentation

## 2012-06-26 DIAGNOSIS — R110 Nausea: Secondary | ICD-10-CM | POA: Insufficient documentation

## 2012-06-26 DIAGNOSIS — Z8739 Personal history of other diseases of the musculoskeletal system and connective tissue: Secondary | ICD-10-CM | POA: Insufficient documentation

## 2012-06-26 DIAGNOSIS — G43909 Migraine, unspecified, not intractable, without status migrainosus: Secondary | ICD-10-CM | POA: Insufficient documentation

## 2012-06-26 DIAGNOSIS — E7800 Pure hypercholesterolemia, unspecified: Secondary | ICD-10-CM | POA: Insufficient documentation

## 2012-06-26 DIAGNOSIS — F319 Bipolar disorder, unspecified: Secondary | ICD-10-CM | POA: Insufficient documentation

## 2012-06-26 DIAGNOSIS — E079 Disorder of thyroid, unspecified: Secondary | ICD-10-CM | POA: Insufficient documentation

## 2012-06-26 DIAGNOSIS — H53149 Visual discomfort, unspecified: Secondary | ICD-10-CM | POA: Insufficient documentation

## 2012-06-26 DIAGNOSIS — K219 Gastro-esophageal reflux disease without esophagitis: Secondary | ICD-10-CM | POA: Insufficient documentation

## 2012-06-26 DIAGNOSIS — F17200 Nicotine dependence, unspecified, uncomplicated: Secondary | ICD-10-CM | POA: Insufficient documentation

## 2012-06-26 DIAGNOSIS — E119 Type 2 diabetes mellitus without complications: Secondary | ICD-10-CM | POA: Insufficient documentation

## 2012-06-26 MED ORDER — KETOROLAC TROMETHAMINE 60 MG/2ML IM SOLN
60.0000 mg | Freq: Once | INTRAMUSCULAR | Status: AC
  Administered 2012-06-26: 60 mg via INTRAMUSCULAR
  Filled 2012-06-26: qty 2

## 2012-06-26 MED ORDER — HYDROMORPHONE HCL PF 1 MG/ML IJ SOLN
1.0000 mg | Freq: Once | INTRAMUSCULAR | Status: AC
  Administered 2012-06-26: 1 mg via INTRAMUSCULAR
  Filled 2012-06-26: qty 1

## 2012-06-26 MED ORDER — DIPHENHYDRAMINE HCL 25 MG PO CAPS
50.0000 mg | ORAL_CAPSULE | Freq: Once | ORAL | Status: AC
  Administered 2012-06-26: 50 mg via ORAL
  Filled 2012-06-26: qty 2

## 2012-06-26 MED ORDER — SUMATRIPTAN SUCCINATE 100 MG PO TABS: ORAL_TABLET | ORAL | Status: AC

## 2012-06-26 MED ORDER — METOCLOPRAMIDE HCL 5 MG/ML IJ SOLN
10.0000 mg | Freq: Once | INTRAMUSCULAR | Status: AC
  Administered 2012-06-26: 10 mg via INTRAMUSCULAR
  Filled 2012-06-26: qty 2

## 2012-06-26 NOTE — ED Notes (Signed)
Headache, nausea, no vomiting.  No relief with hydrocodone.  Blurred vision.left eye , usual with headache.  Out of imitrex

## 2012-06-26 NOTE — ED Provider Notes (Signed)
History     CSN: [id number]  Arrival date & time 06/26/12  1500   First MD Initiated Contact with Patient 06/26/12 1531      Chief Complaint  Patient presents with  . Headache    (Consider location/radiation/quality/duration/timing/severity/associated sxs/prior treatment) HPI Comments: Patient with hx of recurrent migraine headaches, c/o left sided headache of gradual onset that began this morning.  Stats the headache feels similar to previous.  Also c/o blurred vision tot he left eye which is also similar to previous headaches.  She states that she usually takes Imitrex when she has a headache similar to this one, but states she recently ran out .  She also states that she took a vicodin w/o relief or improvement.  She denies numbness, weakness, chest pain, shortness of breath, neck pain or stiffness, fever or vomiting.    Patient is a 52 y.o. female presenting with headaches. The history is provided by the patient.  Headache Pain location:  L temporal and L parietal Quality:  Dull (throbbing) Radiates to:  Does not radiate Severity currently:  9/10 Onset quality:  Gradual Duration:  7 hours Timing:  Constant Progression:  Worsening Chronicity:  Recurrent Similar to prior headaches: yes   Context: activity and bright light   Context: not coughing, not eating, not stress, not exposure to cold air, not loud noise and not straining   Relieved by:  Nothing Worsened by:  Activity, light and sound Ineffective treatments: vicodin. Associated symptoms: blurred vision, nausea and photophobia   Associated symptoms: no abdominal pain, no back pain, no congestion, no cough, no dizziness, no ear pain, no pain, no facial pain, no fever, no focal weakness, no hearing loss, no loss of balance, no near-syncope, no neck pain, no neck stiffness, no numbness, no paresthesias, no seizures, no sinus pressure, no sore throat, no syncope, no visual change, no vomiting and no weakness     Past Medical  History  Diagnosis Date  . Diabetes mellitus   . Thyroid disease   . Elevated cholesterol   . Fibromyalgia   . Anxiety   . Depression   . Bipolar disorder   . PTSD (post-traumatic stress disorder)     after death of son (accidental overdose)  . GERD (gastroesophageal reflux disease)     Past Surgical History  Procedure Laterality Date  . Carpal tunnel release  bilateral  . Vaginal hysterectomy    . Shoulder surgery  left  . Gallbladder    . Knee arthroscopy      bilateral  . Cholecystectomy    . Colonoscopy with propofol N/A 05/01/2012    Procedure: COLONOSCOPY WITH PROPOFOL;  Surgeon: West Bali, MD;  Location: [facility];  Service: Endoscopy;  Laterality: N/A;  in cecum at 0746; total withdrawal time 22 minutes  . Polypectomy N/A 05/01/2012    Procedure: POLYPECTOMY;  Surgeon: West Bali, MD;  Location: [facility];  Service: Endoscopy;  Laterality: N/A;    Family History  Problem Relation Age of Onset  . Cancer    . Diabetes    . Heart disease    . Arthritis    . Lung disease    . Colon cancer Neg Hx   . Stroke Mother   . Cancer - Lung Father     History  Substance Use Topics  . Smoking status: Current Every Day Smoker -- 0.50 packs/day for 3 years  . Smokeless tobacco: Not on file  . Alcohol Use: No  OB History   Grav Para Term Preterm Abortions TAB SAB Ect Mult Living                  Review of Systems  Constitutional: Negative for fever, chills, activity change and appetite change.  HENT: Negative for hearing loss, ear pain, congestion, sore throat, facial swelling, neck pain, neck stiffness and sinus pressure.   Eyes: Positive for blurred vision, photophobia and visual disturbance. Negative for pain and discharge.  Respiratory: Negative for cough and chest tightness.   Cardiovascular: Negative for chest pain, syncope and near-syncope.  Gastrointestinal: Positive for nausea. Negative for vomiting and abdominal pain.  Musculoskeletal: Negative for back  pain.  Skin: Negative for color change.  Neurological: Positive for headaches. Negative for dizziness, focal weakness, seizures, syncope, facial asymmetry, weakness, light-headedness, numbness, paresthesias and loss of balance.  Psychiatric/Behavioral: Negative for behavioral problems and confusion. The patient is not nervous/anxious.   All other systems reviewed and are negative.    Allergies  Review of patient's allergies indicates no known allergies.  Home Medications   Current Outpatient Rx  Name  Route  Sig  Dispense  Refill  . ARIPiprazole (ABILIFY) 20 MG tablet   Oral   Take 15 mg by mouth daily.          Marland Kitchen aspirin 81 MG tablet   Oral   Take 81 mg by mouth daily.         . diazepam (VALIUM) 10 MG tablet   Oral   Take 10-20 mg by mouth 4 (four) times daily. Patient takes 1 tablet three times a day and 2 tablets at bedtime         . exenatide (BYETTA) 10 MCG/0.04ML SOLN   Subcutaneous   Inject into the skin 2 (two) times daily with a meal. Pt takes 10 mcg twice daily         . FLUoxetine (PROZAC) 20 MG capsule   Oral   Take 40 mg by mouth daily.          Marland Kitchen HYDROcodone-acetaminophen (NORCO/VICODIN) 5-325 MG per tablet   Oral   Take 1 tablet by mouth every 6 (six) hours as needed for pain.   60 tablet   5   . ibuprofen (ADVIL,MOTRIN) 800 MG tablet   Oral   Take 1 tablet (800 mg total) by mouth every 8 (eight) hours as needed.   90 tablet   5   . levothyroxine (SYNTHROID, LEVOTHROID) 112 MCG tablet   Oral   Take 112 mcg by mouth daily.         . metFORMIN (GLUCOPHAGE) 500 MG tablet   Oral   Take 1,000 mg by mouth 2 (two) times daily.         . methocarbamol (ROBAXIN) 500 MG tablet   Oral   Take 500 mg by mouth at bedtime.         . methylphenidate (RITALIN) 10 MG tablet   Oral   Take 10 mg by mouth 3 (three) times daily.         . polyethylene glycol-electrolytes (TRILYTE) 420 G solution   Oral   Take 4,000 mLs by mouth as  directed.   4000 mL   0   . pregabalin (LYRICA) 75 MG capsule   Oral   Take 75 mg by mouth at bedtime.         . ranitidine (ZANTAC) 150 MG tablet   Oral   Take 150 mg by mouth 1  day or 1 dose.          . simvastatin (ZOCOR) 20 MG tablet   Oral   Take 40 mg by mouth at bedtime.          . SUMAtriptan (IMITREX) 100 MG tablet      One tablet po prn migraine.  May repeat in 2 hrs if needed.  Do not exceed 200 mg per 24 hrs.   5 tablet   0   . topiramate (TOPAMAX) 100 MG tablet   Oral   Take 100 mg by mouth at bedtime.           BP 119/61  Pulse 84  Temp(Src) 97.8 F (36.6 C)  Resp 20  Ht 5\' 6"  (1.676 m)  Wt 215 lb (97.523 kg)  BMI 34.72 kg/m2  SpO2 97%  Physical Exam  Nursing note and vitals reviewed. Constitutional: She is oriented to person, place, and time. She appears well-developed and well-nourished. No distress.  HENT:  Head: Normocephalic and atraumatic.  Mouth/Throat: Oropharynx is clear and moist.  Eyes: Conjunctivae and EOM are normal. Pupils are equal, round, and reactive to light. Right eye exhibits no discharge. Left eye exhibits no discharge. No scleral icterus.  Neck: Normal range of motion and phonation normal. Neck supple. No rigidity. No Brudzinski's sign and no Kernig's sign noted.  Cardiovascular: Normal rate, regular rhythm, normal heart sounds and intact distal pulses.   No murmur heard. Pulmonary/Chest: Effort normal and breath sounds normal. No respiratory distress. She exhibits no tenderness.  Abdominal: Soft. She exhibits no distension. There is no tenderness.  Musculoskeletal: Normal range of motion.  Neurological: She is alert and oriented to person, place, and time. No cranial nerve deficit or sensory deficit. She exhibits normal muscle tone. Coordination and gait normal.  Reflex Scores:      Tricep reflexes are 2+ on the right side and 2+ on the left side.      Bicep reflexes are 2+ on the right side and 2+ on the left side.       Brachioradialis reflexes are 2+ on the right side and 2+ on the left side.      Patellar reflexes are 2+ on the right side and 2+ on the left side.      Achilles reflexes are 2+ on the right side and 2+ on the left side. Skin: Skin is warm and dry.    ED Course  Procedures (including critical care time)  Labs Reviewed - No data to display No results found.   1. Migraine       MDM    Vitals stable,  Pt is non-toxic appearing.  No focal neuro deficits, no meningeal signs.  Headache of gradual onset that is similar to previous.  Usually takes Imitrex and recently ran out.  Patient agrees to close f/u with Dr. Ouida Sills or to return here if the symptoms worsen.   Patient is feeling better  After IM toradol, reglan, dilaudid and po benadryl and appears stable for discharge  Prescribed #5 Imitrex   The patient appears reasonably screened and/or stabilized for discharge and I doubt any other medical condition or other [facility] requiring further screening, evaluation, or treatment in the ED at this time prior to discharge.       Pinkie Manger L. Trisha Mangle, PA-C 06/26/12 2041

## 2012-06-29 NOTE — ED Provider Notes (Signed)
Medical screening examination/treatment/procedure(s) were performed by non-physician practitioner and as supervising physician I was immediately available for consultation/collaboration.   Joya Gaskins, MD 06/29/12 [phone]

## 2012-07-08 ENCOUNTER — Emergency Department (HOSPITAL_COMMUNITY)
Admission: EM | Admit: 2012-07-08 | Discharge: 2012-07-08 | Disposition: A | Discharge status: Home / Self Care | Payer: Medicaid Other | Attending: Emergency Medicine | Admitting: Emergency Medicine

## 2012-07-08 ENCOUNTER — Encounter (HOSPITAL_COMMUNITY): Payer: Self-pay | Admitting: *Deleted

## 2012-07-08 DIAGNOSIS — F4310 Post-traumatic stress disorder, unspecified: Secondary | ICD-10-CM | POA: Insufficient documentation

## 2012-07-08 DIAGNOSIS — F17200 Nicotine dependence, unspecified, uncomplicated: Secondary | ICD-10-CM | POA: Insufficient documentation

## 2012-07-08 DIAGNOSIS — F319 Bipolar disorder, unspecified: Secondary | ICD-10-CM | POA: Insufficient documentation

## 2012-07-08 DIAGNOSIS — E079 Disorder of thyroid, unspecified: Secondary | ICD-10-CM | POA: Insufficient documentation

## 2012-07-08 DIAGNOSIS — E119 Type 2 diabetes mellitus without complications: Secondary | ICD-10-CM | POA: Insufficient documentation

## 2012-07-08 DIAGNOSIS — Z7982 Long term (current) use of aspirin: Secondary | ICD-10-CM | POA: Insufficient documentation

## 2012-07-08 DIAGNOSIS — G43909 Migraine, unspecified, not intractable, without status migrainosus: Secondary | ICD-10-CM | POA: Insufficient documentation

## 2012-07-08 DIAGNOSIS — F411 Generalized anxiety disorder: Secondary | ICD-10-CM | POA: Insufficient documentation

## 2012-07-08 DIAGNOSIS — E7800 Pure hypercholesterolemia, unspecified: Secondary | ICD-10-CM | POA: Insufficient documentation

## 2012-07-08 DIAGNOSIS — Z79899 Other long term (current) drug therapy: Secondary | ICD-10-CM | POA: Insufficient documentation

## 2012-07-08 DIAGNOSIS — K219 Gastro-esophageal reflux disease without esophagitis: Secondary | ICD-10-CM | POA: Insufficient documentation

## 2012-07-08 DIAGNOSIS — R110 Nausea: Secondary | ICD-10-CM | POA: Insufficient documentation

## 2012-07-08 HISTORY — DX: Migraine, unspecified, not intractable, without status migrainosus: G43.909

## 2012-07-08 MED ORDER — METOCLOPRAMIDE HCL 5 MG/ML IJ SOLN
10.0000 mg | Freq: Once | INTRAMUSCULAR | Status: AC
  Administered 2012-07-08: 10 mg via INTRAMUSCULAR
  Filled 2012-07-08: qty 2

## 2012-07-08 MED ORDER — DIPHENHYDRAMINE HCL 50 MG/ML IJ SOLN
50.0000 mg | Freq: Once | INTRAMUSCULAR | Status: AC
  Administered 2012-07-08: 50 mg via INTRAMUSCULAR
  Filled 2012-07-08: qty 1

## 2012-07-08 MED ORDER — KETOROLAC TROMETHAMINE 60 MG/2ML IM SOLN
30.0000 mg | Freq: Once | INTRAMUSCULAR | Status: AC
  Administered 2012-07-08: 30 mg via INTRAMUSCULAR
  Filled 2012-07-08: qty 2

## 2012-07-08 MED ORDER — PROMETHAZINE HCL 25 MG PO TABS: 25.0000 mg | ORAL_TABLET | Freq: Four times a day (QID) | ORAL | Status: DC | PRN

## 2012-07-08 NOTE — ED Notes (Signed)
Pt c/o migraine headache that started this am, admits to nausea, light and sound sensitive, did take two doses of Imitrex and ibu prior to coming to er with no improvement in pain,  Has hx of migraine

## 2012-07-08 NOTE — ED Provider Notes (Signed)
History     CSN: [id number]  Arrival date & time 07/08/12  1324   First MD Initiated Contact with Patient 07/08/12 1334      Chief Complaint  Patient presents with  . Migraine     HPI Pt was seen at 1335.  Per pt, c/o gradual onset and persistence of constant acute flair of her chronic migraine headache since this morning.  Describes the headache as per her usual chronic migraine headache pain pattern for many years.  Has been associated with nausea.  States she took imitrex and motrin this morning without relief. Denies headache was sudden or maximal in onset or at any time.  Denies visual changes, no focal motor weakness, no tingling/numbness in extremities, no fevers, no neck pain, no rash.      Past Medical History  Diagnosis Date  . Diabetes mellitus   . Thyroid disease   . Elevated cholesterol   . Fibromyalgia   . Anxiety   . Depression   . Bipolar disorder   . PTSD (post-traumatic stress disorder)     after death of son (accidental overdose)  . GERD (gastroesophageal reflux disease)   . Migraine     Past Surgical History  Procedure Laterality Date  . Carpal tunnel release  bilateral  . Vaginal hysterectomy    . Shoulder surgery  left  . Gallbladder    . Knee arthroscopy      bilateral  . Cholecystectomy    . Colonoscopy with propofol N/A 05/01/2012    Procedure: COLONOSCOPY WITH PROPOFOL;  Surgeon: West Bali, MD;  Location: [facility];  Service: Endoscopy;  Laterality: N/A;  in cecum at 0746; total withdrawal time 22 minutes  . Polypectomy N/A 05/01/2012    Procedure: POLYPECTOMY;  Surgeon: West Bali, MD;  Location: [facility];  Service: Endoscopy;  Laterality: N/A;    Family History  Problem Relation Age of Onset  . Cancer    . Diabetes    . Heart disease    . Arthritis    . Lung disease    . Colon cancer Neg Hx   . Stroke Mother   . Cancer - Lung Father     History  Substance Use Topics  . Smoking status: Current Every Day Smoker -- 0.50  packs/day for 3 years  . Smokeless tobacco: Not on file  . Alcohol Use: No    Review of Systems ROS: Statement: All systems negative except as marked or noted in the HPI; Constitutional: Negative for fever and chills. ; ; Eyes: Negative for eye pain, redness and discharge. ; ; ENMT: Negative for ear pain, hoarseness, nasal congestion, sinus pressure and sore throat. ; ; Cardiovascular: Negative for chest pain, palpitations, diaphoresis, dyspnea and peripheral edema. ; ; Respiratory: Negative for cough, wheezing and stridor. ; ; Gastrointestinal: +nausea. Negative for vomiting, diarrhea, abdominal pain, blood in stool, hematemesis, jaundice and rectal bleeding. . ; ; Genitourinary: Negative for dysuria, flank pain and hematuria. ; ; Musculoskeletal: Negative for back pain and neck pain. Negative for swelling and trauma.; ; Skin: Negative for pruritus, rash, abrasions, blisters, bruising and skin lesion.; ; Neuro: +headache. Negative for lightheadedness and neck stiffness. Negative for weakness, altered level of consciousness , altered mental status, extremity weakness, paresthesias, involuntary movement, seizure and syncope.       Allergies  Review of patient's allergies indicates no known allergies.  Home Medications   Current Outpatient Rx  Name  Route  Sig  Dispense  Refill  .  ARIPiprazole (ABILIFY) 20 MG tablet   Oral   Take 20 mg by mouth every morning.          Marland Kitchen aspirin 81 MG tablet   Oral   Take 81 mg by mouth every morning.          . diazepam (VALIUM) 10 MG tablet   Oral   Take 10 mg by mouth 3 (three) times daily.          Marland Kitchen exenatide (BYETTA) 10 MCG/0.04ML SOLN   Subcutaneous   Inject 10 mcg into the skin 2 (two) times daily with a meal. Pt takes 10 mcg twice daily         . FLUoxetine (PROZAC) 20 MG capsule   Oral   Take 40 mg by mouth every morning.          Marland Kitchen HYDROcodone-acetaminophen (NORCO/VICODIN) 5-325 MG per tablet   Oral   Take 1 tablet by mouth  every 6 (six) hours as needed for pain.         Marland Kitchen levothyroxine (SYNTHROID, LEVOTHROID) 112 MCG tablet   Oral   Take 112 mcg by mouth every morning.          . metFORMIN (GLUCOPHAGE) 500 MG tablet   Oral   Take 1,000 mg by mouth 2 (two) times daily.         . methocarbamol (ROBAXIN) 500 MG tablet   Oral   Take 500 mg by mouth at bedtime.         . pregabalin (LYRICA) 75 MG capsule   Oral   Take 75 mg by mouth at bedtime.         . promethazine (PHENERGAN) 25 MG tablet   Oral   Take 1 tablet (25 mg total) by mouth every 6 (six) hours as needed for nausea.   8 tablet   0   . ranitidine (ZANTAC) 150 MG tablet   Oral   Take 150 mg by mouth every morning.          . simvastatin (ZOCOR) 20 MG tablet   Oral   Take 40 mg by mouth at bedtime.          . SUMAtriptan (IMITREX) 100 MG tablet      One tablet po prn migraine.  May repeat in 2 hrs if needed.  Do not exceed 200 mg per 24 hrs.   5 tablet   0   . topiramate (TOPAMAX) 100 MG tablet   Oral   Take 100 mg by mouth at bedtime.           BP 117/69  Pulse 83  Temp(Src) 97.8 F (36.6 C) (Oral)  Resp 17  Ht 5\' 6"  (1.676 m)  Wt 220 lb (99.791 kg)  BMI 35.53 kg/m2  SpO2 100%  Physical Exam 1340: Physical examination:  Nursing notes reviewed; Vital signs and O2 SAT reviewed;  Constitutional: Well developed, Well nourished, Well hydrated, In no acute distress; Head:  Normocephalic, atraumatic; Eyes: EOMI, PERRL, No scleral icterus; ENMT: TM's clear bilat. +edemetous nasal turbinates bilat with clear rhinorrhea. Mouth and pharynx normal, Mucous membranes moist; Neck: Supple, Full range of motion, No lymphadenopathy; Cardiovascular: Regular rate and rhythm, No gallop; Respiratory: Breath sounds clear & equal bilaterally, No rales, rhonchi, wheezes.  Speaking full sentences with ease, Normal respiratory effort/excursion; Chest: Nontender, Movement normal; Abdomen: Soft, Nontender, Nondistended, Normal bowel  sounds; Genitourinary: No CVA tenderness; Extremities: Pulses normal, No tenderness, No edema, No calf edema or  asymmetry.; Neuro: AA&Ox3, Major CN grossly intact. No facial droop. Speech clear. No gross focal motor or sensory deficits in extremities.; Skin: Color normal, Warm, Dry.   ED Course  Procedures    MDM  MDM Reviewed: previous chart, nursing note and vitals     1345:  Long hx of chronic headache with several ED visits for same.  Pt endorses acute flair of her usual long standing chronic headache today, no change from her usual chronic pain pattern.  Pt encouraged to f/u with her PMD and Pain Management doctor for good continuity of care and control of her chronic headaches.  Verb understanding.         Laray Anger, DO 07/12/12 1627

## 2012-07-11 ENCOUNTER — Telehealth: Payer: Self-pay | Admitting: Orthopedic Surgery

## 2012-07-11 NOTE — Telephone Encounter (Signed)
Tammy, please call [place] at [phone], she has a question about a change in directions on her pain medicine

## 2012-07-12 ENCOUNTER — Ambulatory Visit (HOSPITAL_COMMUNITY)
Admission: RE | Admit: 2012-07-12 | Discharge: 2012-07-12 | Disposition: A | Discharge status: Home / Self Care | Payer: Medicaid Other | Source: Ambulatory Visit | Attending: Internal Medicine | Admitting: Internal Medicine

## 2012-07-12 DIAGNOSIS — Z1231 Encounter for screening mammogram for malignant neoplasm of breast: Secondary | ICD-10-CM | POA: Insufficient documentation

## 2012-07-12 DIAGNOSIS — Z139 Encounter for screening, unspecified: Secondary | ICD-10-CM

## 2012-07-12 NOTE — Telephone Encounter (Signed)
Patient stated she needed refill on pain medication. Advised her to have pharmacy to fax request and I would clarify directions with Dr. Romeo Apple at that time.

## 2012-07-17 NOTE — Progress Notes (Signed)
REVIEWED.  TCS FEB 2014 SIMPLE ADENOMAS  OPV JUL 2014 E15 SLF FATTY LIVER DISEASE

## 2012-08-06 ENCOUNTER — Other Ambulatory Visit: Payer: Self-pay | Admitting: *Deleted

## 2012-08-06 MED ORDER — METHOCARBAMOL 500 MG PO TABS: 500.0000 mg | ORAL_TABLET | Freq: Four times a day (QID) | ORAL | Status: DC

## 2012-08-23 ENCOUNTER — Other Ambulatory Visit: Payer: Self-pay | Admitting: *Deleted

## 2012-08-23 DIAGNOSIS — M1710 Unilateral primary osteoarthritis, unspecified knee: Secondary | ICD-10-CM

## 2012-08-23 MED ORDER — HYDROCODONE-ACETAMINOPHEN 5-325 MG PO TABS: 1.0000 | ORAL_TABLET | Freq: Four times a day (QID) | ORAL | Status: DC | PRN

## 2012-08-28 ENCOUNTER — Encounter: Payer: Self-pay | Admitting: Orthopedic Surgery

## 2012-08-28 ENCOUNTER — Ambulatory Visit (INDEPENDENT_AMBULATORY_CARE_PROVIDER_SITE_OTHER): Payer: Medicaid Other

## 2012-08-28 ENCOUNTER — Ambulatory Visit (INDEPENDENT_AMBULATORY_CARE_PROVIDER_SITE_OTHER): Payer: Medicaid Other | Admitting: Orthopedic Surgery

## 2012-08-28 VITALS — BP 102/56 | Ht 68.5 in | Wt 210.0 lb

## 2012-08-28 DIAGNOSIS — M25519 Pain in unspecified shoulder: Secondary | ICD-10-CM

## 2012-08-28 DIAGNOSIS — M25512 Pain in left shoulder: Secondary | ICD-10-CM

## 2012-08-28 DIAGNOSIS — M67919 Unspecified disorder of synovium and tendon, unspecified shoulder: Secondary | ICD-10-CM

## 2012-08-28 DIAGNOSIS — M75102 Unspecified rotator cuff tear or rupture of left shoulder, not specified as traumatic: Secondary | ICD-10-CM

## 2012-08-28 DIAGNOSIS — M75100 Unspecified rotator cuff tear or rupture of unspecified shoulder, not specified as traumatic: Secondary | ICD-10-CM | POA: Insufficient documentation

## 2012-08-28 DIAGNOSIS — S43429A Sprain of unspecified rotator cuff capsule, initial encounter: Secondary | ICD-10-CM

## 2012-08-28 NOTE — Patient Instructions (Addendum)
No heavy lifting MRI left shoulder

## 2012-08-28 NOTE — Progress Notes (Signed)
  Subjective:    [place] is a 52 y.o. female who presents with left shoulder pain. The symptoms began several weeks ago. Aggravating factors: injury while lifting a large sofa, she felt something pop, the arm gave way and since that time she has been unable to raise her arm. Pain is located between the neck and shoulder, in the Posterior deltoid glenohumeral region and Rotator interval. Discomfort is described as aching, burning, sharp/stabbing, throbbing and Locking and catching. Symptoms are exacerbated by lying on the shoulder and Attempts at forward elevation. Evaluation to date: plain films: normal. Therapy to date includes: nothing specific.  The following portions of the patient's history were reviewed and updated as appropriate: allergies, current medications, past family history, past medical history, past social history, past surgical history and problem list.  Review of Systems A comprehensive review of systems was negative except for: Joint pain, muscle pain and muscle, arm stiffness no other symptoms   Objective:    BP 102/56  Ht 5' 8.5" (1.74 m)  Wt 210 lb (95.255 kg)  BMI 31.46 kg/m2 General appearance is normal, the patient is alert and oriented x3 with flat mood and affect. Right shoulder. No tenderness or swelling. Full range of motion. Apprehension negative. Strength normal. Muscle tone normal. Skin normal. Normal radial and ulnar pulse. No lymphadenopathy in the axial lower clavicular area. Sensation normal in the right hand   left shoulder forward elevation less than 90 painful for elevation throughout the full range of motion. Drop test positive for rotator cuff tear. Painful impingement between 100 and 180. Parascapular tenderness. Anterior rotator interval tenderness. Posterior inferior acromial tenderness. Weakness in the supraspinatus tendon. Internal rotation and external rotation strength is normal with decreased internal rotation and external rotation range  of motion. Pulses normal temperature of the extremities normal no edema or swelling. Axilla and clavicular region no lymphadenopathy. Sensation normal with 2+ reflexes at the elbow and the forearm.    Assessment:    Left rotator cuff tendinitis, Differential diagnosis includes rotator cuff syndrome, rotator cuff tear.    Plan:    Rest, ice, compression, and elevation (RICE) therapy. MRI.  if rotator cuff is torn then we would plan a repeat rotator cuff repair.

## 2012-08-29 ENCOUNTER — Telehealth: Payer: Self-pay | Admitting: *Deleted

## 2012-08-29 NOTE — Telephone Encounter (Signed)
MRI  Case # [id number]  Pre-certed Authorization # [id number] effective date 08/29/12 expiration date 09/28/12. Appointment made for 09/03/12 at 3:00 pm. Patient aware.

## 2012-09-03 ENCOUNTER — Ambulatory Visit (HOSPITAL_COMMUNITY)
Admission: RE | Admit: 2012-09-03 | Discharge: 2012-09-03 | Disposition: A | Discharge status: Home / Self Care | Payer: Medicaid Other | Source: Ambulatory Visit | Attending: Orthopedic Surgery | Admitting: Orthopedic Surgery

## 2012-09-03 DIAGNOSIS — M75102 Unspecified rotator cuff tear or rupture of left shoulder, not specified as traumatic: Secondary | ICD-10-CM

## 2012-09-03 DIAGNOSIS — M67919 Unspecified disorder of synovium and tendon, unspecified shoulder: Secondary | ICD-10-CM | POA: Insufficient documentation

## 2012-09-03 DIAGNOSIS — S4980XA Other specified injuries of shoulder and upper arm, unspecified arm, initial encounter: Secondary | ICD-10-CM | POA: Insufficient documentation

## 2012-09-03 DIAGNOSIS — M2420 Disorder of ligament, unspecified site: Secondary | ICD-10-CM | POA: Insufficient documentation

## 2012-09-03 DIAGNOSIS — S46909A Unspecified injury of unspecified muscle, fascia and tendon at shoulder and upper arm level, unspecified arm, initial encounter: Secondary | ICD-10-CM | POA: Insufficient documentation

## 2012-09-03 DIAGNOSIS — M25519 Pain in unspecified shoulder: Secondary | ICD-10-CM | POA: Insufficient documentation

## 2012-09-03 DIAGNOSIS — M719 Bursopathy, unspecified: Secondary | ICD-10-CM | POA: Insufficient documentation

## 2012-09-03 DIAGNOSIS — X500XXA Overexertion from strenuous movement or load, initial encounter: Secondary | ICD-10-CM | POA: Insufficient documentation

## 2012-09-03 DIAGNOSIS — M629 Disorder of muscle, unspecified: Secondary | ICD-10-CM | POA: Insufficient documentation

## 2012-09-04 ENCOUNTER — Telehealth: Payer: Self-pay | Admitting: Orthopedic Surgery

## 2012-09-04 NOTE — Telephone Encounter (Signed)
1.  Can you call the MRI results to [place] at [phone]?  2.  She asked if you will change the Hydrocodone directions to take every 4 hours instead of every 6 hours.  She is having to take it every 4 hours because of her shoulder pain.   She uses [facility]

## 2012-09-06 NOTE — Telephone Encounter (Signed)
Patient called again about MRI and change in medicine directions

## 2012-09-10 ENCOUNTER — Other Ambulatory Visit: Payer: Self-pay | Admitting: *Deleted

## 2012-09-10 ENCOUNTER — Telehealth: Payer: Self-pay | Admitting: *Deleted

## 2012-09-10 DIAGNOSIS — M1710 Unilateral primary osteoarthritis, unspecified knee: Secondary | ICD-10-CM

## 2012-09-10 NOTE — Telephone Encounter (Signed)
Spoke with patient and advised her of MRI results per Dr. Mort Sawyers note.

## 2012-10-08 ENCOUNTER — Encounter: Payer: Self-pay | Admitting: Gastroenterology

## 2012-10-09 ENCOUNTER — Ambulatory Visit (INDEPENDENT_AMBULATORY_CARE_PROVIDER_SITE_OTHER): Payer: Medicaid Other | Admitting: Gastroenterology

## 2012-10-09 ENCOUNTER — Encounter: Payer: Self-pay | Admitting: Gastroenterology

## 2012-10-09 VITALS — BP 104/63 | HR 74 | Temp 97.4°F | Ht 68.0 in | Wt 210.4 lb

## 2012-10-09 DIAGNOSIS — K760 Fatty (change of) liver, not elsewhere classified: Secondary | ICD-10-CM

## 2012-10-09 DIAGNOSIS — K7689 Other specified diseases of liver: Secondary | ICD-10-CM

## 2012-10-09 NOTE — Patient Instructions (Addendum)
Please have blood work completed.   We will see you back in 1 year!

## 2012-10-09 NOTE — Assessment & Plan Note (Signed)
52 year old female returning today in follow-up with history of fatty liver. She continues to lose weight intentionally by reducing portion sizes and exercise. Applauded her on efforts thus far. Anticipate LFTs will be improved with her dietary and behavior modification. Will recheck today and have her return in 1 year. Next surveillance colonoscopy in 2024.

## 2012-10-09 NOTE — Progress Notes (Signed)
Referring Provider: Carylon Perches, MD Primary Care Physician:  Carylon Perches, MD Primary GI: Dr. Darrick Penna   Chief Complaint  Patient presents with  . Follow-up    HPI:   [name] returns today in follow-up with a history of a fatty liver. She is due for updated LFTs. She has lost an additional 9 lbs since her visit in Jan 2014.  No N/V, abdominal pain. Occasional reflux but takes Zantac as needed. Not a daily thing. Takes prn. No dysphagia. Eating less, moving more. Walking for exercise. Walks daily, 5 times a week. States she has lost a total of 69 lbs since starting weight journey. Been about a year.    Past Medical History  Diagnosis Date  . Diabetes mellitus   . Thyroid disease   . Elevated cholesterol   . Fibromyalgia   . Anxiety   . Depression   . Bipolar disorder   . PTSD (post-traumatic stress disorder)     after death of son (accidental overdose)  . GERD (gastroesophageal reflux disease)   . Migraine     Past Surgical History  Procedure Laterality Date  . Carpal tunnel release  bilateral  . Vaginal hysterectomy    . Shoulder surgery  left  . Gallbladder    . Knee arthroscopy      bilateral  . Cholecystectomy    . Colonoscopy with propofol N/A 05/01/2012    [id number] COLON polyps/Mild diverticulosis was noted in the sigmoid colon/Moderate sized internal hemorrhoids. path with tubular adenoma  . Polypectomy N/A 05/01/2012    Procedure: POLYPECTOMY;  Surgeon: West Bali, MD;  Location: [facility];  Service: Endoscopy;  Laterality: N/A;    Current Outpatient Prescriptions  Medication Sig Dispense Refill  . ARIPiprazole (ABILIFY) 20 MG tablet Take 20 mg by mouth every morning.       Marland Kitchen aspirin 81 MG tablet Take 81 mg by mouth every morning.       . diazepam (VALIUM) 10 MG tablet Take 10 mg by mouth 3 (three) times daily.       Marland Kitchen exenatide (BYETTA) 10 MCG/0.04ML SOLN Inject 10 mcg into the skin 2 (two) times daily with a meal. Pt takes 10 mcg twice daily      .  FLUoxetine (PROZAC) 20 MG capsule Take 40 mg by mouth every morning.       Marland Kitchen HYDROcodone-acetaminophen (NORCO/VICODIN) 5-325 MG per tablet Take 1 tablet by mouth every 6 (six) hours as needed for pain.  90 tablet  5  . levothyroxine (SYNTHROID, LEVOTHROID) 112 MCG tablet Take 112 mcg by mouth every morning.       . metFORMIN (GLUCOPHAGE) 500 MG tablet Take 1,000 mg by mouth 2 (two) times daily.      . methocarbamol (ROBAXIN) 500 MG tablet Take 1 tablet (500 mg total) by mouth 4 (four) times daily.  90 tablet  3  . pregabalin (LYRICA) 75 MG capsule Take 75 mg by mouth at bedtime.      . promethazine (PHENERGAN) 25 MG tablet Take 1 tablet (25 mg total) by mouth every 6 (six) hours as needed for nausea.  8 tablet  0  . ranitidine (ZANTAC) 150 MG tablet Take 150 mg by mouth every morning.       . simvastatin (ZOCOR) 20 MG tablet Take 40 mg by mouth at bedtime.       . SUMAtriptan (IMITREX) 100 MG tablet One tablet po prn migraine.  May repeat in 2 hrs if needed.  Do  not exceed 200 mg per 24 hrs.  5 tablet  0  . topiramate (TOPAMAX) 100 MG tablet Take 100 mg by mouth at bedtime.       No current facility-administered medications for this visit.    Allergies as of 10/09/2012  . (No Known Allergies)    Family History  Problem Relation Age of Onset  . Cancer    . Diabetes    . Heart disease    . Arthritis    . Lung disease    . Colon cancer Neg Hx   . Stroke Mother   . Cancer - Lung Father     History   Social History  . Marital Status: Divorced    Spouse Name: N/A    Number of Children: N/A  . Years of Education: N/A   Occupational History  . unemployed    Social History Main Topics  . Smoking status: Current Every Day Smoker -- 0.50 packs/day for 3 years  . Smokeless tobacco: None  . Alcohol Use: No  . Drug Use: No  . Sexually Active: Yes    Birth Control/ Protection: Surgical   Other Topics Concern  . None   Social History Narrative  . None    Review of  Systems: Negative unless mentioned in HPI.   Physical Exam: BP 104/63  Pulse 74  Temp(Src) 97.4 F (36.3 C) (Oral)  Ht 5\' 8"  (1.727 m)  Wt 210 lb 6.4 oz (95.437 kg)  BMI 32 kg/m2 General:   Alert and oriented. No distress noted. Pleasant and cooperative.  Head:  Normocephalic and atraumatic. Eyes:  Conjuctiva clear without scleral icterus. Heart:  S1, S2 present without murmurs, rubs, or gallops. Regular rate and rhythm. Abdomen:  +BS, soft, non-tender and non-distended. No rebound or guarding. No HSM or masses noted. Msk:  Symmetrical without gross deformities. Normal posture. Extremities:  Without edema. Neurologic:  Alert and  oriented x4;  grossly normal neurologically. Skin:  Intact without significant lesions or rashes. Psych:  Alert and cooperative. Normal mood and affect.  Lab Results  Component Value Date   ALT 43* 04/19/2012   AST 38 04/19/2012   ALKPHOS 64 04/19/2012   BILITOT 0.5 04/19/2012

## 2012-10-09 NOTE — Progress Notes (Signed)
Cc PCP 

## 2012-12-26 ENCOUNTER — Telehealth: Payer: Self-pay | Admitting: Orthopedic Surgery

## 2012-12-26 NOTE — Telephone Encounter (Signed)
Call received from patient regarding request for medication refill for: Hydrocodone,Norco/Vicodin 5-325; states was made aware by [facility] upon calling for her refill.  Please advise patient.  She currently has no scheduled appointment; states would like to schedule for knee pain; I relayed to patient that her insurance, [place], requires primary care referral. Patient's ph# [phone].

## 2012-12-26 NOTE — Telephone Encounter (Signed)
Left message for patient to return my call.

## 2012-12-26 NOTE — Telephone Encounter (Signed)
Called patient and she is requesting refill on hydrocodone. Advised her that some patients are being referred to pain management.

## 2012-12-27 ENCOUNTER — Other Ambulatory Visit: Payer: Self-pay | Admitting: Orthopedic Surgery

## 2012-12-27 DIAGNOSIS — M1710 Unilateral primary osteoarthritis, unspecified knee: Secondary | ICD-10-CM

## 2012-12-27 MED ORDER — HYDROCODONE-ACETAMINOPHEN 5-325 MG PO TABS: 1.0000 | ORAL_TABLET | Freq: Four times a day (QID) | ORAL | Status: DC | PRN

## 2012-12-27 NOTE — Telephone Encounter (Signed)
Dr. Harrison refilled. 

## 2012-12-28 ENCOUNTER — Emergency Department: Payer: Self-pay | Admitting: Emergency Medicine

## 2013-01-14 ENCOUNTER — Other Ambulatory Visit: Payer: Self-pay | Admitting: *Deleted

## 2013-01-14 DIAGNOSIS — M25512 Pain in left shoulder: Secondary | ICD-10-CM

## 2013-01-14 MED ORDER — METHOCARBAMOL 500 MG PO TABS: 500.0000 mg | ORAL_TABLET | Freq: Four times a day (QID) | ORAL | Status: DC

## 2013-02-05 ENCOUNTER — Ambulatory Visit (INDEPENDENT_AMBULATORY_CARE_PROVIDER_SITE_OTHER): Payer: Medicaid Other | Admitting: Orthopedic Surgery

## 2013-02-05 VITALS — BP 122/78 | Ht 68.5 in | Wt 216.0 lb

## 2013-02-05 DIAGNOSIS — M179 Osteoarthritis of knee, unspecified: Secondary | ICD-10-CM

## 2013-02-05 DIAGNOSIS — M1710 Unilateral primary osteoarthritis, unspecified knee: Secondary | ICD-10-CM

## 2013-02-05 MED ORDER — HYDROCODONE-ACETAMINOPHEN 5-325 MG PO TABS: 1.0000 | ORAL_TABLET | Freq: Four times a day (QID) | ORAL | Status: DC | PRN

## 2013-02-05 NOTE — Patient Instructions (Signed)

## 2013-02-05 NOTE — Progress Notes (Signed)
Patient ID: [name], female   DOB: Oct 07, 1960, 52 y.o.   MRN: [id number] Chief Complaint  Patient presents with  . Knee Pain    Bilateral knee pain, request injections. Consult from Dr. Ouida Sills    This "consult" as been requested to get bilateral knee injections which is actually a ongoing problem  Knee  Injection Procedure Note  Pre-operative Diagnosis: left knee oa  Post-operative Diagnosis: same  Indications: pain  Anesthesia: ethyl chloride   Procedure Details   Verbal consent was obtained for the procedure. Time out was completed.The joint was prepped with alcohol, followed by  Ethyl chloride spray and A 20 gauge needle was inserted into the knee via lateral approach; 4ml 1% lidocaine and 1 ml of depomedrol  was then injected into the joint . The needle was removed and the area cleansed and dressed.  Complications:  None; patient tolerated the procedure well. Knee  Injection Procedure Note  Pre-operative Diagnosis: right knee oa  Post-operative Diagnosis: same  Indications: pain  Anesthesia: ethyl chloride   Procedure Details   Verbal consent was obtained for the procedure. Time out was completed.The joint was prepped with alcohol, followed by  Ethyl chloride spray and A 20 gauge needle was inserted into the knee via lateral approach; 4ml 1% lidocaine and 1 ml of depomedrol  was then injected into the joint . The needle was removed and the area cleansed and dressed.  Complications:  None; patient tolerated the procedure well.  Meds ordered this encounter  Medications  . HYDROcodone-acetaminophen (NORCO/VICODIN) 5-325 MG per tablet    Sig: Take 1 tablet by mouth every 6 (six) hours as needed.    Dispense:  136 tablet    Refill:  0   The patient should seek pain management counseling and management

## 2013-04-22 ENCOUNTER — Telehealth: Payer: Self-pay | Admitting: Orthopedic Surgery

## 2013-04-22 NOTE — Telephone Encounter (Signed)
Routing to Dr Harrison 

## 2013-04-22 NOTE — Telephone Encounter (Signed)
[place] Led[place] asked for a prescription for Hydrocodone 5/325

## 2013-04-23 ENCOUNTER — Other Ambulatory Visit: Payer: Self-pay | Admitting: Orthopedic Surgery

## 2013-04-23 DIAGNOSIS — M1710 Unilateral primary osteoarthritis, unspecified knee: Secondary | ICD-10-CM

## 2013-04-23 DIAGNOSIS — M179 Osteoarthritis of knee, unspecified: Secondary | ICD-10-CM

## 2013-04-23 MED ORDER — HYDROCODONE-ACETAMINOPHEN 5-325 MG PO TABS: 1.0000 | ORAL_TABLET | Freq: Four times a day (QID) | ORAL | Status: DC | PRN

## 2013-04-23 NOTE — Telephone Encounter (Signed)
Advised patient that prescription was ready to be picked up.

## 2013-04-23 NOTE — Telephone Encounter (Signed)
Prescription picked up by the patient

## 2013-05-03 ENCOUNTER — Ambulatory Visit: Payer: Self-pay

## 2013-05-08 ENCOUNTER — Emergency Department (HOSPITAL_COMMUNITY)
Admission: EM | Admit: 2013-05-08 | Discharge: 2013-05-08 | Disposition: A | Discharge status: Home / Self Care | Payer: Medicaid Other | Attending: Emergency Medicine | Admitting: Emergency Medicine

## 2013-05-08 ENCOUNTER — Emergency Department (HOSPITAL_COMMUNITY): Payer: Medicaid Other

## 2013-05-08 ENCOUNTER — Encounter (HOSPITAL_COMMUNITY): Payer: Self-pay | Admitting: Emergency Medicine

## 2013-05-08 DIAGNOSIS — Z7982 Long term (current) use of aspirin: Secondary | ICD-10-CM | POA: Insufficient documentation

## 2013-05-08 DIAGNOSIS — J40 Bronchitis, not specified as acute or chronic: Secondary | ICD-10-CM

## 2013-05-08 DIAGNOSIS — F319 Bipolar disorder, unspecified: Secondary | ICD-10-CM | POA: Insufficient documentation

## 2013-05-08 DIAGNOSIS — K219 Gastro-esophageal reflux disease without esophagitis: Secondary | ICD-10-CM | POA: Insufficient documentation

## 2013-05-08 DIAGNOSIS — E079 Disorder of thyroid, unspecified: Secondary | ICD-10-CM | POA: Insufficient documentation

## 2013-05-08 DIAGNOSIS — F411 Generalized anxiety disorder: Secondary | ICD-10-CM | POA: Insufficient documentation

## 2013-05-08 DIAGNOSIS — F4310 Post-traumatic stress disorder, unspecified: Secondary | ICD-10-CM | POA: Insufficient documentation

## 2013-05-08 DIAGNOSIS — E119 Type 2 diabetes mellitus without complications: Secondary | ICD-10-CM | POA: Insufficient documentation

## 2013-05-08 DIAGNOSIS — Z79899 Other long term (current) drug therapy: Secondary | ICD-10-CM | POA: Insufficient documentation

## 2013-05-08 DIAGNOSIS — G43909 Migraine, unspecified, not intractable, without status migrainosus: Secondary | ICD-10-CM | POA: Insufficient documentation

## 2013-05-08 DIAGNOSIS — Z8739 Personal history of other diseases of the musculoskeletal system and connective tissue: Secondary | ICD-10-CM | POA: Insufficient documentation

## 2013-05-08 DIAGNOSIS — F17200 Nicotine dependence, unspecified, uncomplicated: Secondary | ICD-10-CM | POA: Insufficient documentation

## 2013-05-08 MED ORDER — IPRATROPIUM-ALBUTEROL 0.5-2.5 (3) MG/3ML IN SOLN: 3.0000 mL | RESPIRATORY_TRACT | Status: DC

## 2013-05-08 MED ORDER — PREDNISONE 10 MG PO TABS: ORAL_TABLET | ORAL | Status: DC

## 2013-05-08 MED ORDER — ALBUTEROL SULFATE (2.5 MG/3ML) 0.083% IN NEBU
10.0000 mg | INHALATION_SOLUTION | RESPIRATORY_TRACT | Status: DC
  Administered 2013-05-08: 10 mg via RESPIRATORY_TRACT
  Filled 2013-05-08: qty 12

## 2013-05-08 MED ORDER — IPRATROPIUM BROMIDE 0.02 % IN SOLN: 0.5000 mg | Freq: Once | RESPIRATORY_TRACT | Status: DC

## 2013-05-08 MED ORDER — ALBUTEROL SULFATE (2.5 MG/3ML) 0.083% IN NEBU
2.5000 mg | INHALATION_SOLUTION | Freq: Once | RESPIRATORY_TRACT | Status: AC
  Administered 2013-05-08: 2.5 mg via RESPIRATORY_TRACT
  Filled 2013-05-08: qty 3

## 2013-05-08 MED ORDER — ALBUTEROL SULFATE (2.5 MG/3ML) 0.083% IN NEBU: 5.0000 mg | INHALATION_SOLUTION | Freq: Once | RESPIRATORY_TRACT | Status: DC

## 2013-05-08 MED ORDER — PREDNISONE 10 MG PO TABS
60.0000 mg | ORAL_TABLET | Freq: Once | ORAL | Status: AC
  Administered 2013-05-08: 60 mg via ORAL
  Filled 2013-05-08 (×2): qty 1

## 2013-05-08 MED ORDER — IPRATROPIUM-ALBUTEROL 0.5-2.5 (3) MG/3ML IN SOLN
3.0000 mL | Freq: Once | RESPIRATORY_TRACT | Status: AC
Start: 2013-05-08 — End: 2013-05-08
  Administered 2013-05-08: 3 mL via RESPIRATORY_TRACT
  Filled 2013-05-08: qty 3

## 2013-05-08 MED ORDER — PROMETHAZINE-CODEINE 6.25-10 MG/5ML PO SYRP
5.0000 mL | ORAL_SOLUTION | Freq: Four times a day (QID) | ORAL | Status: DC | PRN
Start: 2013-05-08 — End: 2014-05-19

## 2013-05-08 MED ORDER — ALBUTEROL (5 MG/ML) CONTINUOUS INHALATION SOLN: 10.0000 mg/h | INHALATION_SOLUTION | RESPIRATORY_TRACT | Status: DC

## 2013-05-08 MED ORDER — HYDROCOD POLST-CHLORPHEN POLST 10-8 MG/5ML PO LQCR
5.0000 mL | Freq: Once | ORAL | Status: AC
  Administered 2013-05-08: 5 mL via ORAL
  Filled 2013-05-08: qty 5

## 2013-05-08 MED ORDER — ALBUTEROL SULFATE HFA 108 (90 BASE) MCG/ACT IN AERS
2.0000 | INHALATION_SPRAY | RESPIRATORY_TRACT | Status: DC
  Administered 2013-05-08: 2 via RESPIRATORY_TRACT
  Filled 2013-05-08: qty 6.7

## 2013-05-08 NOTE — ED Notes (Signed)
Pt seen and evaluated by EDPa for initial assessment. 

## 2013-05-08 NOTE — ED Notes (Signed)
Pt given discharge instructions, and is waiting for RT to bring inhaler to take home.

## 2013-05-08 NOTE — ED Provider Notes (Signed)
CSN: [id number]     Arrival date & time 05/08/13  1555 History   First MD Initiated Contact with Patient 05/08/13 1620     Chief Complaint  Patient presents with  . Cough  . Shortness of Breath     (Consider location/radiation/quality/duration/timing/severity/associated sxs/prior Treatment) HPI Comments: Patient is a 53 year old female who presents to the emergency department with complaint of cough and shortness of breath. The patient states that she has been having a problem with cough and shortness of breath for approximately 3-4 days. She states she's had some temperature elevations. The highest being 100.3. She's also had some mild sore throat. It is of note she's been exposed to family members with upper respiratory infection and strep throat. The cough is mostly nonproductive. Patient states she's been coughing so much that now she has some burning in her chest. She also states that she is having some" heaviness" in her chest. There's been no sweats. No loss of consciousness, no nausea vomiting, no neck jaw or arm pain. No hemoptysis reported.  Patient is a 53 y.o. female presenting with cough and shortness of breath. The history is provided by the patient.  Cough Associated symptoms: shortness of breath   Associated symptoms: no chest pain, no eye discharge and no wheezing   Shortness of Breath Associated symptoms: cough   Associated symptoms: no abdominal pain, no chest pain, no neck pain and no wheezing     Past Medical History  Diagnosis Date  . Diabetes mellitus   . Thyroid disease   . Elevated cholesterol   . Fibromyalgia   . Anxiety   . Depression   . Bipolar disorder   . PTSD (post-traumatic stress disorder)     after death of son (accidental overdose)  . GERD (gastroesophageal reflux disease)   . Migraine    Past Surgical History  Procedure Laterality Date  . Carpal tunnel release  bilateral  . Vaginal hysterectomy    . Shoulder surgery  left  . Gallbladder     . Knee arthroscopy      bilateral  . Cholecystectomy    . Colonoscopy with propofol N/A 05/01/2012    [id number] COLON polyps/Mild diverticulosis was noted in the sigmoid colon/Moderate sized internal hemorrhoids. path with tubular adenoma  . Polypectomy N/A 05/01/2012    Procedure: POLYPECTOMY;  Surgeon: Danie Binder, MD;  Location: [facility];  Service: Endoscopy;  Laterality: N/A;   Family History  Problem Relation Age of Onset  . Cancer    . Diabetes    . Heart disease    . Arthritis    . Lung disease    . Colon cancer Neg Hx   . Stroke Mother   . Cancer - Lung Father    History  Substance Use Topics  . Smoking status: Current Every Day Smoker -- 0.50 packs/day for 3 years  . Smokeless tobacco: Not on file  . Alcohol Use: No   OB History   Grav Para Term Preterm Abortions TAB SAB Ect Mult Living                 Review of Systems  Constitutional: Negative for activity change.       All ROS Neg except as noted in HPI  HENT: Positive for congestion. Negative for nosebleeds.   Eyes: Negative for photophobia and discharge.  Respiratory: Positive for cough, chest tightness and shortness of breath. Negative for wheezing.   Cardiovascular: Negative for chest pain and palpitations.  Gastrointestinal:  Negative for abdominal pain and blood in stool.  Genitourinary: Negative for dysuria, frequency and hematuria.  Musculoskeletal: Negative for arthralgias, back pain and neck pain.  Skin: Negative.   Neurological: Negative for dizziness, seizures and speech difficulty.  Psychiatric/Behavioral: Negative for hallucinations and confusion.      Allergies  Review of patient's allergies indicates no known allergies.  Home Medications   Current Outpatient Rx  Name  Route  Sig  Dispense  Refill  . ARIPiprazole (ABILIFY) 20 MG tablet   Oral   Take 20 mg by mouth every morning.          Marland Kitchen aspirin 81 MG tablet   Oral   Take 81 mg by mouth every morning.          . diazepam  (VALIUM) 10 MG tablet   Oral   Take 10 mg by mouth 3 (three) times daily.          Marland Kitchen exenatide (BYETTA) 10 MCG/0.04ML SOLN   Subcutaneous   Inject 10 mcg into the skin 2 (two) times daily with a meal. Pt takes 10 mcg twice daily         . FLUoxetine (PROZAC) 20 MG capsule   Oral   Take 40 mg by mouth every morning.          Marland Kitchen HYDROcodone-acetaminophen (NORCO/VICODIN) 5-325 MG per tablet   Oral   Take 1 tablet by mouth every 6 (six) hours as needed.   120 tablet   0   . levothyroxine (SYNTHROID, LEVOTHROID) 112 MCG tablet   Oral   Take 112 mcg by mouth every morning.          . metFORMIN (GLUCOPHAGE) 500 MG tablet   Oral   Take 1,000 mg by mouth 2 (two) times daily.         . methocarbamol (ROBAXIN) 500 MG tablet   Oral   Take 1 tablet (500 mg total) by mouth 4 (four) times daily.   90 tablet   5   . pregabalin (LYRICA) 75 MG capsule   Oral   Take 75 mg by mouth at bedtime.         . promethazine (PHENERGAN) 25 MG tablet   Oral   Take 1 tablet (25 mg total) by mouth every 6 (six) hours as needed for nausea.   8 tablet   0   . ranitidine (ZANTAC) 150 MG tablet   Oral   Take 150 mg by mouth every morning.          . simvastatin (ZOCOR) 20 MG tablet   Oral   Take 40 mg by mouth at bedtime.          . SUMAtriptan (IMITREX) 100 MG tablet      One tablet po prn migraine.  May repeat in 2 hrs if needed.  Do not exceed 200 mg per 24 hrs.   5 tablet   0   . topiramate (TOPAMAX) 100 MG tablet   Oral   Take 100 mg by mouth at bedtime.          BP 118/66  Pulse 82  Temp(Src) 97.3 F (36.3 C) (Oral)  Resp 22  Ht 5\' 6"  (1.676 m)  Wt 222 lb (100.699 kg)  BMI 35.85 kg/m2  SpO2 97% Physical Exam  Nursing note and vitals reviewed. Constitutional: She is oriented to person, place, and time. She appears well-developed and well-nourished.  Non-toxic appearance.  HENT:  Head: Normocephalic.  Right Ear:  Tympanic membrane and external ear normal.   Left Ear: Tympanic membrane and external ear normal.  Eyes: EOM and lids are normal. Pupils are equal, round, and reactive to light.  Neck: Normal range of motion. Neck supple. Carotid bruit is not present.  Cardiovascular: Normal rate, regular rhythm, normal heart sounds, intact distal pulses and normal pulses.   Pulmonary/Chest: No respiratory distress. She has wheezes. She has rhonchi.  The patient complete sentences. There is no use of the assessment muscles for assistance with breathing. Bilateral wheezes and rhonchi present.  Abdominal: Soft. Bowel sounds are normal. There is no tenderness. There is no guarding.  Musculoskeletal: Normal range of motion.  Lymphadenopathy:       Head (right side): No submandibular adenopathy present.       Head (left side): No submandibular adenopathy present.    She has no cervical adenopathy.  Neurological: She is alert and oriented to person, place, and time. She has normal strength. No cranial nerve deficit or sensory deficit.  Skin: Skin is warm and dry.  Psychiatric: She has a normal mood and affect. Her speech is normal.    ED Course  Procedures (including critical care time) Labs Review Labs Reviewed - No data to display Imaging Review No results found.  EKG Interpretation   None       MDM Pt presents to ED with 2 days of non-productive cough. She c/o some temp elevation and congestion. She now has congestion and difficulty with breathing. C/o a heaviness in her chest, mostly associated with coughing.   Final diagnoses:  None    *I have reviewed nursing notes, vital signs, and all appropriate lab and imaging results for this patient.** Pt sounds some better after the 1st neb treatment. Will start a 2nd to improve wheezing and air flow.  After 2 albuterol neb treatments, pt states it still fills hard to breathe. She continues to have some congestion and wheezing present. Continuous neb given to the patient. Pt sounding better. Pt  ambulatory in the hall without problem. Pt speaks in complete sentences. Pt discharged home with albuterol, prednisone taper, and promethazine cough medication.  Lenox Ahr, PA-C 05/09/13 [phone]

## 2013-05-08 NOTE — Discharge Instructions (Signed)
Your examination and x-ray is consistent with bronchitis and upper respiratory infection. Please use promethazine cough medication for cough. Please use albuterol 2 puffs every 4 hours for wheezing. Prednisone taper daily. Please monitor your glucose while taking this medication. Please see Dr. Willey Blade in 3 or 4 days for followup and recheck. Please stop smoking. Bronchitis Bronchitis is swelling (inflammation) of the air tubes leading to your lungs (bronchi). This causes mucus and a cough. If the swelling gets bad, you may have trouble breathing. HOME CARE   Rest.  Drink enough fluids to keep your pee (urine) clear or pale yellow (unless you have a condition where you have to watch how much you drink).  Only take medicine as told by your doctor. If you were given antibiotic medicines, finish them even if you start to feel better.  Avoid smoke, irritating chemicals, and strong smells. These make the problem worse. Quit smoking if you smoke. This helps your lungs heal faster.  Use a cool mist humidifier. Change the water in the humidifier every day. You can also sit in the bathroom with hot shower running for 5 10 minutes. Keep the door closed.  See your health care provider as told.  Wash your hands often. GET HELP IF: Your problems do not get better after 1 week. GET HELP RIGHT AWAY IF:   Your fever gets worse.  You have chills.  Your chest hurts.  Your problems breathing get worse.  You have blood in your mucus.  You pass out (faint).  You feel lightheaded.  You have a bad headache.  You throw up (vomit) again and again. MAKE SURE YOU:  Understand these instructions.  Will watch your condition.  Will get help right away if you are not doing well or get worse. Document Released: 08/24/2007 Document Revised: 12/26/2012 Document Reviewed: 10/30/2012 [facility] Patient Information 2014 [place], [place].

## 2013-05-08 NOTE — ED Notes (Signed)
Assessment deleted, incorrect pt.

## 2013-05-08 NOTE — ED Notes (Addendum)
Pt c/o nonproductive cough, chest burning when coughs, and fever x 2 days.

## 2013-05-08 NOTE — ED Notes (Signed)
RT in room with pt.

## 2013-05-12 NOTE — ED Provider Notes (Signed)
Medical screening examination/treatment/procedure(s) were performed by non-physician practitioner and as supervising physician I was immediately available for consultation/collaboration.  EKG Interpretation    Date/Time:  Wednesday May 08 2013 16:37:41 EST Ventricular Rate:  86 PR Interval:  168 QRS Duration: 92 QT Interval:  392 QTC Calculation: 469 R Axis:   -9 Text Interpretation:  Normal sinus rhythm Nonspecific T wave abnormality Prolonged QT Abnormal ECG When compared with ECG of 25-Apr-2010 18:51, Nonspecific T wave abnormality now evident in Lateral leads ED PHYSICIAN INTERPRETATION AVAILABLE IN [place] [facility] Confirmed by TEST, RECORD ([id number]) on 05/10/2013 8:52:45 AM             Nat Christen, MD 05/12/13 2054

## 2013-06-04 ENCOUNTER — Telehealth: Payer: Self-pay | Admitting: Orthopedic Surgery

## 2013-06-04 ENCOUNTER — Other Ambulatory Visit (HOSPITAL_COMMUNITY): Payer: Self-pay | Admitting: Internal Medicine

## 2013-06-04 DIAGNOSIS — Z1231 Encounter for screening mammogram for malignant neoplasm of breast: Secondary | ICD-10-CM

## 2013-06-04 NOTE — Telephone Encounter (Signed)
If its not time she has to wait

## 2013-06-04 NOTE — Progress Notes (Signed)
REVIEWED.  

## 2013-06-04 NOTE — Telephone Encounter (Signed)
Routing to Dr Harrison 

## 2013-06-04 NOTE — Telephone Encounter (Signed)
Advised patient of Dr. Harrison's reply. 

## 2013-06-13 ENCOUNTER — Encounter: Payer: Self-pay | Admitting: Orthopedic Surgery

## 2013-06-13 ENCOUNTER — Ambulatory Visit (INDEPENDENT_AMBULATORY_CARE_PROVIDER_SITE_OTHER): Payer: Medicaid Other | Admitting: Orthopedic Surgery

## 2013-06-13 VITALS — BP 125/70 | Ht 68.5 in | Wt 222.0 lb

## 2013-06-13 DIAGNOSIS — Z9889 Other specified postprocedural states: Secondary | ICD-10-CM

## 2013-06-13 DIAGNOSIS — M1711 Unilateral primary osteoarthritis, right knee: Secondary | ICD-10-CM

## 2013-06-13 DIAGNOSIS — IMO0002 Reserved for concepts with insufficient information to code with codable children: Secondary | ICD-10-CM

## 2013-06-13 DIAGNOSIS — M179 Osteoarthritis of knee, unspecified: Secondary | ICD-10-CM

## 2013-06-13 DIAGNOSIS — M1710 Unilateral primary osteoarthritis, unspecified knee: Secondary | ICD-10-CM

## 2013-06-13 DIAGNOSIS — M23359 Other meniscus derangements, posterior horn of lateral meniscus, unspecified knee: Secondary | ICD-10-CM | POA: Insufficient documentation

## 2013-06-13 MED ORDER — HYDROCODONE-ACETAMINOPHEN 5-325 MG PO TABS: 1.0000 | ORAL_TABLET | Freq: Four times a day (QID) | ORAL | Status: DC | PRN

## 2013-06-13 NOTE — Patient Instructions (Addendum)
Chronic Pain Chronic pain can be defined as pain that is off and on and lasts for 3 6 months or longer. Many things cause chronic pain, which can make it difficult to make a diagnosis. There are many treatment options available for chronic pain. However, finding a treatment that works well for you may require trying various approaches until the right one is found. Many people benefit from a combination of two or more types of treatment to control their pain. SYMPTOMS  Chronic pain can occur anywhere in the body and can range from mild to very severe. Some types of chronic pain include:  Headache.  Low back pain.  Cancer pain.  Arthritis pain.  Neurogenic pain. This is pain resulting from damage to nerves. People with chronic pain may also have other symptoms such as:  Depression.  Anger.  Insomnia.  Anxiety. DIAGNOSIS  Your health care provider will help diagnose your condition over time. In many cases, the initial focus will be on excluding possible conditions that could be causing the pain. Depending on your symptoms, your health care provider may order tests to diagnose your condition. Some of these tests may include:   Blood tests.   CT scan.   MRI.   X-rays.   Ultrasounds.   Nerve conduction studies.  You may need to see a specialist.  TREATMENT  Finding treatment that works well may take time. You may be referred to a pain specialist. He or she may prescribe medicine or therapies, such as:   Mindful meditation or yoga.  Shots (injections) of numbing or pain-relieving medicines into the spine or area of pain.  Local electrical stimulation.  Acupuncture.   Massage therapy.   Aroma, color, light, or sound therapy.   Biofeedback.   Working with a physical therapist to keep from getting stiff.   Regular, gentle exercise.   Cognitive or behavioral therapy.   Group support.   Continue brace right knee  You have received a steroid shot.  15% of patients experience increased pain at the injection site with in the next 24 hours. This is best treated with ice and tylenol extra strength 2 tabs every 8 hours. If you are still having pain please call the office.

## 2013-06-13 NOTE — Progress Notes (Signed)
Patient ID: [name], female   DOB: Aug 06, 1960, 53 y.o.   MRN: [id number] Chief Complaint  Patient presents with  . Follow-up    Recheck on bilateral knees.   Right knee lateral pain giving way and popping   Bilateral knee pain with bilateral joint pain   History she is status post bilateral knee arthroscopies. The consult has been requested by Dr. Willey Blade  In 2010 she had a partial medial meniscectomy of the posterior horn and was noted to have mild arthritis of medial compartment and trochlea and patella.  She still upturned felt a pop in her right knee and since that time is a giving way episodes over the last 2-3 weeks. Her left knee is also symptomatic with pain and swelling on an intermittent basis  She also has chronic pain. She's managed with Norco 5 mg #120 per month  Review of systems reveals no other major issues at this time no chest pain shortness of breath or GI complaints  Vital signs: BP 125/70  Ht 5' 8.5" (1.74 m)  Wt 222 lb (100.699 kg)  BMI 33.26 kg/m2   General the patient is well-developed and well-nourished grooming and hygiene are normal Oriented x3 Mood and affect normal Ambulation normal  Inspection of the right knee no effusion lateral joint line tenderness passive range of motion is normal ligaments are stable motor exam is normal negative McMurray sign skin clean dry and intact normal pulse no peripheral edema  Left knee medial and lateral joint line tenderness flexion 125 ligament stable motor exam normal skin clean dry and intact distal pulses intact sensation normal  Encounter Diagnoses  Name Primary?  . S/P medial meniscectomy of right knee Yes  . Arthritis of right knee   . Lateral meniscus, posterior horn derangement   . OA (osteoarthritis) of knee     I injected both knees  Procedure Injection right knee left knee Medications: Ethyl chloride for topical anesthetic. Lidocaine 1% 3 cc. 40 mg of Depo-Medrol per mL, 1 mL. Verbal  consent. Timeout to confirm site of injection as right knee left knee Alcohol was used to clean the skin followed by ethyl chloride to anesthetize the skin. A lateral approach was used to inject the knee with lidocaine and Depo-Medrol.  No complications were noted. A sterile bandage was applied.  Return 4 weeks if no improvement MRI right knee for possible lateral meniscal tear

## 2013-06-17 ENCOUNTER — Telehealth: Payer: Self-pay | Admitting: Orthopedic Surgery

## 2013-06-17 NOTE — Telephone Encounter (Signed)
[name] is asking if you will write a prescription for her to get hinged knee brace for her left knee  at [facility]

## 2013-06-18 NOTE — Telephone Encounter (Signed)
YES   HINGED KNEE BRACE FROM

## 2013-06-18 NOTE — Telephone Encounter (Signed)
Faxed prescription to [facility] 

## 2013-07-11 ENCOUNTER — Ambulatory Visit (INDEPENDENT_AMBULATORY_CARE_PROVIDER_SITE_OTHER): Payer: Medicaid Other | Admitting: Orthopedic Surgery

## 2013-07-11 ENCOUNTER — Ambulatory Visit (INDEPENDENT_AMBULATORY_CARE_PROVIDER_SITE_OTHER): Payer: Medicaid Other

## 2013-07-11 ENCOUNTER — Encounter: Payer: Self-pay | Admitting: Orthopedic Surgery

## 2013-07-11 VITALS — BP 130/68 | Ht 68.5 in | Wt 222.0 lb

## 2013-07-11 DIAGNOSIS — M25569 Pain in unspecified knee: Secondary | ICD-10-CM

## 2013-07-11 DIAGNOSIS — Z9889 Other specified postprocedural states: Secondary | ICD-10-CM

## 2013-07-11 DIAGNOSIS — M179 Osteoarthritis of knee, unspecified: Secondary | ICD-10-CM

## 2013-07-11 DIAGNOSIS — IMO0002 Reserved for concepts with insufficient information to code with codable children: Secondary | ICD-10-CM

## 2013-07-11 DIAGNOSIS — M23359 Other meniscus derangements, posterior horn of lateral meniscus, unspecified knee: Secondary | ICD-10-CM

## 2013-07-11 DIAGNOSIS — M1710 Unilateral primary osteoarthritis, unspecified knee: Secondary | ICD-10-CM

## 2013-07-11 MED ORDER — HYDROCODONE-ACETAMINOPHEN 5-325 MG PO TABS: 1.0000 | ORAL_TABLET | Freq: Four times a day (QID) | ORAL | Status: DC | PRN

## 2013-07-11 NOTE — Progress Notes (Signed)
Patient ID: [name], female   DOB: 01/28/1961, 53 y.o.   MRN: [id number]  Chief Complaint  Patient presents with  . Follow-up    1 month recheck on right knee with xray.    Followup visit for this 53 year old female who stood up turned and twisted and started having some lateral knee pain with giving way episodes about 6 weeks ago. She was given a cortisone injection in the knee brace she presents back with medial lateral and anterior knee pain.  X-ray was taken today it shows arthritis in the patellofemoral joint and the medial compartment mild to moderate  Examination reveals the following BP 130/68  Ht 5' 8.5" (1.74 m)  Wt 222 lb (100.699 kg)  BMI 33.26 kg/m2  General appearance is normal, the patient is alert and oriented x3 with normal mood and affect.  She's a mature with a brace on the left and right knee. She occasionally will use a cane.  She is tender over the lateral joint line medial joint line and the patellar tendon. She maintains excellent full range of motion with some pain at terminal flexion and a negative McMurray sign. Ligaments remain stable. Neurovascular exam unremarkable normal.  Encounter Diagnoses  Name Primary?  . Knee pain   . OA (osteoarthritis) of knee Yes  . S/P medial meniscectomy of right knee   . Lateral meniscus, posterior horn derangement     Meds ordered this encounter  Medications  . HYDROcodone-acetaminophen (NORCO/VICODIN) 5-325 MG per tablet    Sig: Take 1 tablet by mouth every 6 (six) hours as needed.    Dispense:  120 tablet    Refill:  0    Recommend continue bracing with hydrocodone as needed for pain, and knee exercises 4 week recheck the knee again

## 2013-07-11 NOTE — Patient Instructions (Addendum)
Continue to wear brace Knee Exercises EXERCISES RANGE OF MOTION(ROM) AND STRETCHING EXERCISES These exercises may help you when beginning to rehabilitate your injury. Your symptoms may resolve with or without further involvement from your physician, physical therapist or athletic trainer. While completing these exercises, remember:    Restoring tissue flexibility helps normal motion to return to the joints. This allows healthier, less painful movement and activity.   An effective stretch should be held for at least 30 seconds.   A stretch should never be painful. You should only feel a gentle lengthening or release in the stretched tissue.  STRETCH - Knee Extension, Prone  Lie on your stomach on a firm surface, such as a bed or countertop. Place your right / left knee and leg just beyond the edge of the surface. You may wish to place a towel under the far end of your right / left thigh for comfort.   Relax your leg muscles and allow gravity to straighten your knee. Your clinician may advise you to add an ankle weight if more resistance is helpful for you.  You should feel a stretch in the back of your right / left knee.  RANGE OF MOTION - Knee Flexion, Active  Lie on your back with both knees straight. (If this causes back discomfort, bend your opposite knee, placing your foot flat on the floor.)   Slowly slide your heel back toward your buttocks until you feel a gentle stretch in the front of your knee or thigh.     STRETCH - Quadriceps, Prone   Lie on your stomach on a firm surface, such as a bed or padded floor.   Bend your right / left knee and grasp your ankle. If you are unable to reach, your ankle or pant leg, use a belt around your foot to lengthen your reach.   Gently pull your heel toward your buttocks. Your knee should not slide out to the side. You should feel a stretch in the front of your thigh and/or knee.   STRETCH  Hamstrings, Supine   Lie on your back. Loop a belt  or towel over the ball of your right / left foot.   Straighten your right / left knee and slowly pull on the belt to raise your leg. Do not allow the right / left knee to bend. Keep your opposite leg flat on the floor.  Raise the leg until you feel a gentle stretch behind your right / left knee or thigh.  STRENGTHENING EXERCISES These exercises may help you when beginning to rehabilitate your injury. They may resolve your symptoms with or without further involvement from your physician, physical therapist or athletic trainer. While completing these exercises, remember:    Muscles can gain both the endurance and the strength needed for everyday activities through controlled exercises.   Complete these exercises as instructed by your physician, physical therapist or athletic trainer. Progress the resistance and repetitions only as guided.   You may experience muscle soreness or fatigue, but the pain or discomfort you are trying to eliminate should never worsen during these exercises. If this pain does worsen, stop and make certain you are following the directions exactly. If the pain is still present after adjustments, discontinue the exercise until you can discuss the trouble with your clinician.  STRENGTH - Quadriceps, Isometrics  Lie on your back with your right / left leg extended and your opposite knee bent.   Gradually tense the muscles in the front of  your right / left thigh. You should see either your knee cap slide up toward your hip or increased dimpling just above the knee. This motion will push the back of the knee down toward the floor/mat/bed on which you are lying.   STRENGTH - Quadriceps, Short Arcs   Lie on your back. Place a rolled  towel roll under your knee so that the knee slightly bends.   Raise only your lower leg by tightening the muscles in the front of your thigh. Do not allow your thigh to rise.     STRENGTH - Quadriceps, Straight Leg Raises  Quality counts! Watch  for signs that the quadriceps muscle is working to insure you are strengthening the correct muscles and not "cheating" by substituting with healthier muscles.  Lay on your back with your right / left leg extended and your opposite knee bent.   Tense the muscles in the front of your right / left thigh. You should see either your knee cap slide up or increased dimpling just above the knee. Your thigh may even quiver.  Tighten these muscles even more and raise your leg 4 to 6 inches off the floor.   STRENGTH - Hamstring, Curls  Lay on your stomach with your legs extended. (If you lay on a bed, your feet may hang over the edge.)   Tighten the muscles in the back of your thigh to bend your right / left knee up to 90 degrees. Keep your hips flat on the bed/floor.    STRENGTH  Quadriceps, Squats  Stand in a door frame so that your feet and knees are in line with the frame.   Use your hands for balance, not support, on the frame.   Slowly lower your weight, bending at the hips and knees. Keep your lower legs upright so that they are parallel with the door frame. Squat only within the range that does not increase your knee pain. Never let your hips drop below your knees.   Slowly return upright, pushing with your legs, not pulling with your hands.   STRENGTH - Quadriceps, Wall Slides  Follow guidelines for form closely. Increased knee pain often results from poorly placed feet or knees.  Lean against a smooth wall or door and walk your feet out 18-24 inches. Place your feet hip-width apart.   Slowly slide down the wall or door until your knees bend _30________ degrees.* Keep your knees over your heels, not your toes, and in line with your hips, not falling to either side.   * Your physician, physical therapist or athletic trainer will alter this angle based on your symptoms and progress. Document Released: 01/19/2005 Document Revised: 05/30/2011 Document Reviewed: 06/19/2008 [facility] Patient  Information 2013 [employer].

## 2013-07-15 ENCOUNTER — Ambulatory Visit (HOSPITAL_COMMUNITY): Payer: Self-pay

## 2013-07-18 ENCOUNTER — Ambulatory Visit (HOSPITAL_COMMUNITY)
Admission: RE | Admit: 2013-07-18 | Discharge: 2013-07-18 | Disposition: A | Discharge status: Home / Self Care | Payer: Medicaid Other | Source: Ambulatory Visit | Attending: Internal Medicine | Admitting: Internal Medicine

## 2013-07-18 DIAGNOSIS — Z1231 Encounter for screening mammogram for malignant neoplasm of breast: Secondary | ICD-10-CM | POA: Insufficient documentation

## 2013-07-29 ENCOUNTER — Other Ambulatory Visit: Payer: Self-pay | Admitting: *Deleted

## 2013-07-29 DIAGNOSIS — M25512 Pain in left shoulder: Secondary | ICD-10-CM

## 2013-07-29 MED ORDER — METHOCARBAMOL 500 MG PO TABS: 500.0000 mg | ORAL_TABLET | Freq: Four times a day (QID) | ORAL | Status: DC

## 2013-08-08 ENCOUNTER — Ambulatory Visit (INDEPENDENT_AMBULATORY_CARE_PROVIDER_SITE_OTHER): Payer: Medicaid Other | Admitting: Orthopedic Surgery

## 2013-08-08 VITALS — BP 116/70 | Ht 68.5 in | Wt 222.0 lb

## 2013-08-08 DIAGNOSIS — M1710 Unilateral primary osteoarthritis, unspecified knee: Secondary | ICD-10-CM

## 2013-08-08 DIAGNOSIS — IMO0002 Reserved for concepts with insufficient information to code with codable children: Secondary | ICD-10-CM

## 2013-08-08 DIAGNOSIS — M545 Low back pain, unspecified: Secondary | ICD-10-CM | POA: Insufficient documentation

## 2013-08-08 DIAGNOSIS — M179 Osteoarthritis of knee, unspecified: Secondary | ICD-10-CM

## 2013-08-08 MED ORDER — HYDROCODONE-ACETAMINOPHEN 5-325 MG PO TABS: 1.0000 | ORAL_TABLET | Freq: Four times a day (QID) | ORAL | Status: DC | PRN

## 2013-08-08 MED ORDER — PREDNISONE (PAK) 5 MG PO TABS: ORAL_TABLET | ORAL | Status: DC

## 2013-08-08 NOTE — Progress Notes (Signed)
Patient ID: [name], female   DOB: 14-Mar-1961, 53 y.o.   MRN: [id number]  No chief complaint on file.  Chief complaint recheck right knee status post injection. Complains knee still sore  During her examination however we developed for her discomfort some thigh pain lateral knee pain anterior compartment pain and review of systems does reveal having excuse exacerbation of lower back pain as well. She also needs a refill on her hydrocodone.   BP 116/70  Ht 5' 8.5" (1.74 m)  Wt 222 lb (100.699 kg)  BMI 33.26 kg/m2  General appearance is normal, the patient is alert and oriented x3 with normal mood and affect.  Exam shows tenderness along the thigh lateral knee anterior compartment. The joint a little bit tender some of the soreness is related to the quadriceps exercises that she's been doing  Knee range of motion is normal no joint effusion mild tenderness around the knee is noted. Muscle tone is normal.  Encounter Diagnoses  Name Primary?  . OA (osteoarthritis) of knee Yes  . Lower back pain

## 2013-08-08 NOTE — Patient Instructions (Addendum)
Stop knee exercises  Start dose pack  Continue norco  Meds ordered this encounter  Medications  . HYDROcodone-acetaminophen (NORCO/VICODIN) 5-325 MG per tablet    Sig: Take 1 tablet by mouth every 6 (six) hours as needed.    Dispense:  120 tablet    Refill:  0  . predniSONE (STERAPRED UNI-PAK) 5 MG TABS tablet    Sig: 5 mg 12 days as directed    Dispense:  48 tablet    Refill:  0

## 2013-09-09 ENCOUNTER — Telehealth: Payer: Self-pay | Admitting: Orthopedic Surgery

## 2013-09-09 NOTE — Telephone Encounter (Signed)
Routing to Dr Harrison 

## 2013-09-09 NOTE — Telephone Encounter (Signed)
Patient called requesting a refill on hydrocodone. Patient phone is [phone]

## 2013-09-15 NOTE — Telephone Encounter (Signed)
REFILL 

## 2013-09-17 ENCOUNTER — Ambulatory Visit (INDEPENDENT_AMBULATORY_CARE_PROVIDER_SITE_OTHER): Payer: Medicaid Other | Admitting: Orthopedic Surgery

## 2013-09-17 VITALS — BP 117/67 | Ht 68.5 in | Wt 222.0 lb

## 2013-09-17 DIAGNOSIS — M5386 Other specified dorsopathies, lumbar region: Secondary | ICD-10-CM | POA: Insufficient documentation

## 2013-09-17 DIAGNOSIS — M5430 Sciatica, unspecified side: Secondary | ICD-10-CM

## 2013-09-17 MED ORDER — HYDROCODONE-ACETAMINOPHEN 5-325 MG PO TABS: 1.0000 | ORAL_TABLET | Freq: Four times a day (QID) | ORAL | Status: DC | PRN

## 2013-09-17 NOTE — Progress Notes (Signed)
Subjective:     Patient ID: [name], female   DOB: 1960-07-13, 53 y.o.   MRN: [id number]  Chief complaint lower back pain   HPI 53 year old female presents for recheck of her lower back after treatment with oral medications without good success. Back in 2010 she had diffuse annular bulging disc at L3-4 with mild to moderate facet disease with early spinal and bilateral lateral recess stenosis she had no other evidence of disc problems at that time. She's not had good response to current medication.  Recommend further evaluation.  Review of Systems at this point she's not had any other symptoms she is having chronic pain of course which we've been treating with Norco 5 mg successfully. No bowel or bladder dysfunction     Objective:   Physical Exam BP 117/67  Ht 5' 8.5" (1.74 m)  Wt 222 lb (100.699 kg)  BMI 33.26 kg/m2 Awake alert and oriented x3 mood and affect normal gait normal.  Currently she is primarily having tenderness in the lower back decreased range of motion but no instability no increased muscle tone skin without back related skin lesions. Some radicular symptoms in the right leg but normal soft touch and pressure sensation good pulses distally without adenopathy    Assessment:     Encounter Diagnosis  Name Primary?  . Sciatica associated with disorder of lumbar spine, unspecified laterality Yes        Plan:     Continue current pain medication  Recommend physical therapy and reassessment afterwards to see if MRI needed

## 2013-09-17 NOTE — Patient Instructions (Addendum)
Call to arrange therapy Call after third therapy visit to set up appointment if no better

## 2013-10-01 ENCOUNTER — Ambulatory Visit (HOSPITAL_COMMUNITY): Payer: Medicaid Other | Admitting: Physical Therapy

## 2013-10-01 DIAGNOSIS — M545 Low back pain, unspecified: Secondary | ICD-10-CM | POA: Insufficient documentation

## 2013-10-01 DIAGNOSIS — F411 Generalized anxiety disorder: Secondary | ICD-10-CM | POA: Insufficient documentation

## 2013-10-01 DIAGNOSIS — R262 Difficulty in walking, not elsewhere classified: Secondary | ICD-10-CM | POA: Insufficient documentation

## 2013-10-01 DIAGNOSIS — IMO0001 Reserved for inherently not codable concepts without codable children: Secondary | ICD-10-CM | POA: Insufficient documentation

## 2013-10-01 DIAGNOSIS — M5430 Sciatica, unspecified side: Secondary | ICD-10-CM | POA: Insufficient documentation

## 2013-10-01 DIAGNOSIS — E119 Type 2 diabetes mellitus without complications: Secondary | ICD-10-CM | POA: Insufficient documentation

## 2013-10-14 ENCOUNTER — Telehealth: Payer: Self-pay | Admitting: Orthopedic Surgery

## 2013-10-14 ENCOUNTER — Ambulatory Visit (HOSPITAL_COMMUNITY)
Admission: RE | Admit: 2013-10-14 | Discharge: 2013-10-14 | Disposition: A | Discharge status: Home / Self Care | Payer: Medicaid Other | Source: Ambulatory Visit | Attending: Orthopedic Surgery | Admitting: Orthopedic Surgery

## 2013-10-14 ENCOUNTER — Other Ambulatory Visit: Payer: Self-pay | Admitting: Orthopedic Surgery

## 2013-10-14 DIAGNOSIS — M4800 Spinal stenosis, site unspecified: Secondary | ICD-10-CM | POA: Diagnosis present

## 2013-10-14 DIAGNOSIS — M545 Low back pain, unspecified: Secondary | ICD-10-CM | POA: Diagnosis not present

## 2013-10-14 DIAGNOSIS — IMO0001 Reserved for inherently not codable concepts without codable children: Secondary | ICD-10-CM | POA: Diagnosis present

## 2013-10-14 DIAGNOSIS — R262 Difficulty in walking, not elsewhere classified: Secondary | ICD-10-CM | POA: Insufficient documentation

## 2013-10-14 DIAGNOSIS — M5430 Sciatica, unspecified side: Secondary | ICD-10-CM | POA: Diagnosis present

## 2013-10-14 DIAGNOSIS — E119 Type 2 diabetes mellitus without complications: Secondary | ICD-10-CM | POA: Diagnosis not present

## 2013-10-14 DIAGNOSIS — F411 Generalized anxiety disorder: Secondary | ICD-10-CM | POA: Diagnosis not present

## 2013-10-14 MED ORDER — HYDROCODONE-ACETAMINOPHEN 5-325 MG PO TABS: 1.0000 | ORAL_TABLET | Freq: Four times a day (QID) | ORAL | Status: DC | PRN

## 2013-10-14 NOTE — Evaluation (Signed)
Physical Therapy Evaluation  Patient Details  Name: [name] MRN: [id number] Date of Birth: [birth year]  Today's Date: 10/14/2013 Time: 0920-1005 PT Time Calculation (min): 45 min  Charge:  evaluation            Visit#: 1 of 1  Re-eval:   Assessment Diagnosis: Sciatica  Authorization: [place]icaid    Authorization Time Period:    Authorization Visit#:   of     Past Medical History:  Past Medical History  Diagnosis Date  . Diabetes mellitus   . Thyroid disease   . Elevated cholesterol   . Fibromyalgia   . Anxiety   . Depression   . Bipolar disorder   . PTSD (post-traumatic stress disorder)     after death of son (accidental overdose)  . GERD (gastroesophageal reflux disease)   . Migraine    Past Surgical History:  Past Surgical History  Procedure Laterality Date  . Carpal tunnel release  bilateral  . Vaginal hysterectomy    . Shoulder surgery  left  . Gallbladder    . Knee arthroscopy      bilateral  . Cholecystectomy    . Colonoscopy with propofol N/A 05/01/2012    LZJ:QBHAL COLON polyps/Mild diverticulosis was noted in the sigmoid colon/Moderate sized internal hemorrhoids. path with tubular adenoma  . Polypectomy N/A 05/01/2012    Procedure: POLYPECTOMY;  Surgeon: Danie Binder, MD;  Location: [facility];  Service: Endoscopy;  Laterality: N/A;    Subjective Symptoms/Limitations Symptoms: [name] has had chronic LBP for years but has had an acute exacerbation since April.  She is having pain that goes down into the knee of her Rt leg with B LBP.    Pertinent History: 2010 dx with a diffuse annular bulge of L3-4 with early stenosis.  How long can you sit comfortably?: 15 minutes  How long can you stand comfortably?: 10-15 How long can you walk comfortably?: able to walk for 5-10 minutes Pain Assessment Currently in Pain?: Yes Pain Score: 8  Pain Location: Back Pain Orientation: Lower Pain Radiating Towards: Rt knee Pain Onset: More than a month ago Pain  Frequency: Constant Pain Relieving Factors: lying down Effect of Pain on Daily Activities: increases   Balance Screening  no falls   Assessment Lumbar AROM Lumbar Flexion: NT Lumbar Extension: decreased 20% Lumbar - Right Side Bend: decreased 20%  Lumbar - Left Side Bend: decreased 20% Lumbar - Right Rotation: decreased 25% Lumbar - Left Rotation: decreased 25%  Exercise/Treatments    Stretches Active Hamstring Stretch: 2 reps;30 seconds Single Knee to Chest Stretch: 2 reps;30 seconds Standing Extension: 5 reps Prone on Elbows Stretch: 60 seconds Press Ups: 5 reps   Supine Ab Set: 5 reps Bridge: 5 reps  Physical Therapy Assessment and Plan PT Assessment and Plan Clinical Impression Statement: Pt is a 53 yo female with hx of chronicl Low back pain.  Pt was educated in sitting posture, stretching and strengthening exercises.  Pt was informed that her insurance covered one treatment only and pt opted to have the evaluation only.  Pt educated in posture, body mechanics and exercises to decrease radicular sx.  PT Plan: one time visit for education    Goals Home Exercise Program Pt/caregiver will Perform Home Exercise Program: For increased ROM;For increased strengthening  Problem List Patient Active Problem List   Diagnosis Date Noted  . Difficulty in walking(719.7) 10/14/2013  . Sciatica associated with disorder of lumbar spine 09/17/2013  . Lower back pain 08/08/2013  .  Lateral meniscus, posterior horn derangement 06/13/2013  . Arthritis of right knee 06/13/2013  . S/P medial meniscectomy of right knee 06/13/2013  . Left shoulder pain 08/28/2012  . Rotator cuff syndrome of left shoulder 08/28/2012  . Rotator cuff tear 08/28/2012  . Pes anserinus bursitis 05/16/2012  . Encounter for screening colonoscopy 04/16/2012  . Fatty liver 04/16/2012  . Back pain 07/26/2011  . Knee bursitis, left 07/26/2011  . OA (osteoarthritis) of knee 07/26/2011  . Spinal stenosis  10/28/2010  . Medial meniscus, posterior horn derangement 08/12/2010  . Knee pain 08/04/2010  . Sciatica 08/04/2010  . H N P-LUMBAR 01/14/2010  . 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 LUMBOSACRAL STRAIN 05/12/2009  . DERANGEMENT OF POSTERIOR HORN OF MEDIAL MENISCUS 01/19/2009  . BACK PAIN 01/19/2009  . LOWER LEG, ARTHRITIS, DEGEN./OSTEO 12/02/2008  . JOINT EFFUSION, KNEE 11/10/2008  . ANSERINE BURSITIS, RIGHT 11/10/2008  . DERANGEMENT MENISCUS 10/15/2008  . KNEE PAIN 10/15/2008    PT Plan of Care PT Home Exercise Plan: given  GP    RUSSELL,[name] 10/14/2013, 9:59 AM  Physician Documentation Your signature is required to indicate approval of the treatment plan as stated above.  Please sign and either send electronically or make a copy of this report for your files and return this physician signed original.   Please mark one 1.__approve of plan  2. ___approve of plan with the following conditions.   ______________________________                                                          _____________________ Physician Signature                                                                                                             Date SBNR

## 2013-10-14 NOTE — Telephone Encounter (Signed)
[place] wants a prescription for Hydrocodone 5/325

## 2013-10-15 NOTE — Telephone Encounter (Signed)
Patient picked up prescription.

## 2013-10-15 NOTE — Telephone Encounter (Signed)
Prescription available for pickup and patient aware

## 2013-10-17 ENCOUNTER — Encounter: Payer: Self-pay | Admitting: Gastroenterology

## 2013-10-31 ENCOUNTER — Ambulatory Visit (INDEPENDENT_AMBULATORY_CARE_PROVIDER_SITE_OTHER): Payer: Medicaid Other | Admitting: Orthopedic Surgery

## 2013-10-31 VITALS — BP 119/72 | Ht 68.5 in | Wt 222.0 lb

## 2013-10-31 DIAGNOSIS — M48061 Spinal stenosis, lumbar region without neurogenic claudication: Secondary | ICD-10-CM

## 2013-10-31 MED ORDER — HYDROCODONE-ACETAMINOPHEN 5-325 MG PO TABS: 1.0000 | ORAL_TABLET | Freq: Four times a day (QID) | ORAL | Status: DC | PRN

## 2013-10-31 NOTE — Progress Notes (Addendum)
Chief Complaint  Patient presents with  . Follow-up    Follow up after therapy.   BP 119/72  Ht 5' 8.5" (1.74 m)  Wt 222 lb (100.699 kg)  BMI 33.42 kg/m39  53 years old this is a recheck after she was sent to therapy for her lumbar disc disease she has a bulging disc at L3-4 which was diagnosed in 2010 MRI. She has moderate facet disease with early spinal and bilateral lateral recess stenosis. She has failed anti-inflammatories, analgesics including Norco 5 mg and physical therapy.   Review of systems bilateral knee pain.  Established problem worsening. Prescription. Hopefully now we can get the MRI so that she can have a repeat series of steroid injections which helped her last time  She comes back we'll get both knees for injection  Meds ordered this encounter  Medications  . HYDROcodone-acetaminophen (NORCO/VICODIN) 5-325 MG per tablet    Sig: Take 1 tablet by mouth every 6 (six) hours as needed for moderate pain.    Dispense:  90 tablet    Refill:  0

## 2013-10-31 NOTE — Patient Instructions (Signed)
We will schedule MRI for you

## 2013-11-13 ENCOUNTER — Ambulatory Visit (HOSPITAL_COMMUNITY)
Admission: RE | Admit: 2013-11-13 | Discharge: 2013-11-13 | Disposition: A | Discharge status: Home / Self Care | Payer: Medicaid Other | Source: Ambulatory Visit | Attending: Orthopedic Surgery | Admitting: Orthopedic Surgery

## 2013-11-13 DIAGNOSIS — M48061 Spinal stenosis, lumbar region without neurogenic claudication: Secondary | ICD-10-CM

## 2013-11-13 DIAGNOSIS — M79609 Pain in unspecified limb: Secondary | ICD-10-CM | POA: Diagnosis present

## 2013-11-13 DIAGNOSIS — M5137 Other intervertebral disc degeneration, lumbosacral region: Secondary | ICD-10-CM | POA: Insufficient documentation

## 2013-11-13 DIAGNOSIS — M47817 Spondylosis without myelopathy or radiculopathy, lumbosacral region: Secondary | ICD-10-CM | POA: Diagnosis not present

## 2013-11-13 DIAGNOSIS — M545 Low back pain, unspecified: Secondary | ICD-10-CM | POA: Diagnosis not present

## 2013-11-13 DIAGNOSIS — M51379 Other intervertebral disc degeneration, lumbosacral region without mention of lumbar back pain or lower extremity pain: Secondary | ICD-10-CM | POA: Insufficient documentation

## 2013-11-21 ENCOUNTER — Ambulatory Visit (INDEPENDENT_AMBULATORY_CARE_PROVIDER_SITE_OTHER): Payer: Medicaid Other | Admitting: Orthopedic Surgery

## 2013-11-21 ENCOUNTER — Telehealth: Payer: Self-pay | Admitting: *Deleted

## 2013-11-21 VITALS — BP 108/48 | Ht 68.5 in | Wt 222.0 lb

## 2013-11-21 DIAGNOSIS — M1710 Unilateral primary osteoarthritis, unspecified knee: Secondary | ICD-10-CM

## 2013-11-21 DIAGNOSIS — M48061 Spinal stenosis, lumbar region without neurogenic claudication: Secondary | ICD-10-CM

## 2013-11-21 DIAGNOSIS — G894 Chronic pain syndrome: Secondary | ICD-10-CM

## 2013-11-21 DIAGNOSIS — M170 Bilateral primary osteoarthritis of knee: Secondary | ICD-10-CM

## 2013-11-21 MED ORDER — HYDROCODONE-ACETAMINOPHEN 5-325 MG PO TABS: 1.0000 | ORAL_TABLET | Freq: Four times a day (QID) | ORAL | Status: DC | PRN

## 2013-11-21 NOTE — Patient Instructions (Signed)
We will refer you for Epidural steroid injections (2inj- L3-4)

## 2013-11-21 NOTE — Telephone Encounter (Signed)
Fax sent to Dr Willey Blade with office note and recommendation for [facility] referral, due to patients insurance, referral will need to be made by PCP

## 2013-11-21 NOTE — Progress Notes (Signed)
Established patient followup back pain no improvement  Requests bilateral knee injections  Lumbar spine pain with radicular symptoms into the right leg down to the right knee improved with exercises but persistent back pain. MRI reviewed showed persistent L3-4 disc disease with thecal sac impingement without definite spinal stenosis and there are some other levels with mild involvement  Review of systems no changes noted no new problems noted  Exam she seems a little bit foggy today but is oriented to person and time appearance is otherwise normal vital signs are stable BP 108/48  Ht 5' 8.5" (1.74 m)  Wt 222 lb (100.699 kg)  BMI 33.26 kg/m2  She's angulated without assistive devices  Shows bilateral knee tenderness bilateral knee swelling knee flexion ARC is 125 both knees are stable muscle tone normal good pulses are noted distally.    Osteoarthritis both knees repeat injections  Knee  Injection Procedure Note  Pre-operative Diagnosis: left knee oa  Post-operative Diagnosis: same  Indications: pain  Anesthesia: ethyl chloride   Procedure Details   Verbal consent was obtained for the procedure. Time out was completed.The joint was prepped with alcohol, followed by  Ethyl chloride spray and A 20 gauge needle was inserted into the knee via lateral approach; 50ml 1% lidocaine and 1 ml of depomedrol  was then injected into the joint . The needle was removed and the area cleansed and dressed.  Complications:  None; patient tolerated the procedure well. Knee  Injection Procedure Note  Pre-operative Diagnosis: right knee oa  Post-operative Diagnosis: same  Indications: pain  Anesthesia: ethyl chloride   Procedure Details   Verbal consent was obtained for the procedure. Time out was completed.The joint was prepped with alcohol, followed by  Ethyl chloride spray and A 20 gauge needle was inserted into the knee via lateral approach; 56ml 1% lidocaine and 1 ml of depomedrol  was  then injected into the joint . The needle was removed and the area cleansed and dressed.  Complications:  None; patient tolerated the procedure well.  Chronic pain continue Norco Meds ordered this encounter  Medications  . HYDROcodone-acetaminophen (NORCO/VICODIN) 5-325 MG per tablet    Sig: Take 1 tablet by mouth every 6 (six) hours as needed for moderate pain.    Dispense:  90 tablet    Refill:  0    Spinal stenosis Epidural steroids L3-4 return 2 months

## 2013-12-11 ENCOUNTER — Other Ambulatory Visit: Payer: Self-pay | Admitting: *Deleted

## 2013-12-11 ENCOUNTER — Telehealth: Payer: Self-pay | Admitting: Orthopedic Surgery

## 2013-12-11 MED ORDER — HYDROCODONE-ACETAMINOPHEN 5-325 MG PO TABS: 1.0000 | ORAL_TABLET | Freq: Four times a day (QID) | ORAL | Status: DC | PRN

## 2013-12-11 NOTE — Telephone Encounter (Signed)
Patient called to request refill of medication:   HYDROcodone-acetaminophen (NORCO/VICODIN) 5-325 MG per tablet [[id number]]  Her phone # is [phone]

## 2014-01-02 ENCOUNTER — Telehealth: Payer: Self-pay | Admitting: Orthopedic Surgery

## 2014-01-02 ENCOUNTER — Other Ambulatory Visit: Payer: Self-pay | Admitting: *Deleted

## 2014-01-02 MED ORDER — HYDROCODONE-ACETAMINOPHEN 5-325 MG PO TABS: 1.0000 | ORAL_TABLET | Freq: Four times a day (QID) | ORAL | Status: DC | PRN

## 2014-01-02 NOTE — Telephone Encounter (Signed)
Prescription available, patient aware  

## 2014-01-02 NOTE — Telephone Encounter (Signed)
Patient called to request refill, pain medication: HYDROcodone-acetaminophen (NORCO/VICODIN) 5-325 MG per tablet [[id number]  Please call her cell phone # [phone].

## 2014-01-21 ENCOUNTER — Encounter: Payer: Self-pay | Admitting: Orthopedic Surgery

## 2014-01-21 ENCOUNTER — Ambulatory Visit: Payer: Medicaid Other | Admitting: Orthopedic Surgery

## 2014-01-27 ENCOUNTER — Telehealth: Payer: Self-pay | Admitting: Orthopedic Surgery

## 2014-01-27 ENCOUNTER — Other Ambulatory Visit: Payer: Self-pay | Admitting: *Deleted

## 2014-01-27 DIAGNOSIS — M25512 Pain in left shoulder: Secondary | ICD-10-CM

## 2014-01-27 MED ORDER — HYDROCODONE-ACETAMINOPHEN 5-325 MG PO TABS: 1.0000 | ORAL_TABLET | Freq: Four times a day (QID) | ORAL | Status: DC | PRN

## 2014-01-27 MED ORDER — METHOCARBAMOL 500 MG PO TABS: 500.0000 mg | ORAL_TABLET | Freq: Four times a day (QID) | ORAL | Status: DC

## 2014-01-27 NOTE — Telephone Encounter (Signed)
Patient called, states received her missed appointment letter - said that she's been staying with sister who has had surgery.  Requests to re-schedule appointment, which has been done, for 02/17/14.  She is asking for refill of her pain medication, Hydrocodone/Norco 5-325.  Her ph#'s are: Sister's # [phone]/[facility] # [phone]

## 2014-01-28 NOTE — Telephone Encounter (Signed)
Patient called back, pick up today, 01/28/2014.

## 2014-01-28 NOTE — Telephone Encounter (Signed)
Prescription available, called patient, not available, left message

## 2014-02-17 ENCOUNTER — Encounter: Payer: Self-pay | Admitting: Orthopedic Surgery

## 2014-02-17 ENCOUNTER — Ambulatory Visit (INDEPENDENT_AMBULATORY_CARE_PROVIDER_SITE_OTHER): Payer: Medicaid Other | Admitting: Orthopedic Surgery

## 2014-02-17 VITALS — BP 106/70 | Ht 68.5 in | Wt 232.0 lb

## 2014-02-17 DIAGNOSIS — M170 Bilateral primary osteoarthritis of knee: Secondary | ICD-10-CM

## 2014-02-17 DIAGNOSIS — M48061 Spinal stenosis, lumbar region without neurogenic claudication: Secondary | ICD-10-CM

## 2014-02-17 DIAGNOSIS — M4806 Spinal stenosis, lumbar region: Secondary | ICD-10-CM

## 2014-02-17 MED ORDER — HYDROCODONE-ACETAMINOPHEN 5-325 MG PO TABS: 1.0000 | ORAL_TABLET | Freq: Four times a day (QID) | ORAL | Status: DC | PRN

## 2014-02-17 NOTE — Progress Notes (Signed)
Patient ID: [name], female   DOB: Jul 23, 1960, 53 y.o.   MRN: [id number] [place] returns today for bilateral knee injections. She's been coming every 2-3 months. Last injection seems like it was in September  She would like both knees injected again for ongoing pain and osteoarthritis  She reports that she scheduled for epidural injection today which I see no need in holding up either injection series.  Procedure note right knee injection verbal consent was obtained to inject right knee joint  Timeout was completed to confirm the site of injection  The medications used were 40 mg of Depo-Medrol and 1% lidocaine 3 cc  Anesthesia was provided by ethyl chloride and the skin was prepped with alcohol.  After cleaning the skin with alcohol a 20-gauge needle was used to inject the right knee joint. There were no complications. A sterile bandage was applied.   Procedure note left knee injection verbal consent was obtained to inject left knee joint  Timeout was completed to confirm the site of injection  The medications used were 40 mg of Depo-Medrol and 1% lidocaine 3 cc  Anesthesia was provided by ethyl chloride and the skin was prepped with alcohol.  After cleaning the skin with alcohol a 20-gauge needle was used to inject the left knee joint. There were no complications. A sterile bandage was applied.

## 2014-03-10 ENCOUNTER — Other Ambulatory Visit: Payer: Self-pay | Admitting: *Deleted

## 2014-03-10 ENCOUNTER — Telehealth: Payer: Self-pay | Admitting: Orthopedic Surgery

## 2014-03-10 DIAGNOSIS — M48061 Spinal stenosis, lumbar region without neurogenic claudication: Secondary | ICD-10-CM

## 2014-03-10 DIAGNOSIS — M170 Bilateral primary osteoarthritis of knee: Secondary | ICD-10-CM

## 2014-03-10 MED ORDER — HYDROCODONE-ACETAMINOPHEN 5-325 MG PO TABS: 1.0000 | ORAL_TABLET | Freq: Four times a day (QID) | ORAL | Status: DC | PRN

## 2014-03-10 MED ORDER — HYDROCODONE-ACETAMINOPHEN 5-325 MG PO TABS
1.0000 | ORAL_TABLET | Freq: Four times a day (QID) | ORAL | Status: DC | PRN
Start: 2014-03-10 — End: 2014-03-10

## 2014-03-10 NOTE — Telephone Encounter (Signed)
Patient is calling asking for a refill on her pain medication HYDROcodone-acetaminophen (NORCO/VICODIN) 5-325 MG per tablet  Please advise?

## 2014-03-10 NOTE — Telephone Encounter (Signed)
Prescription available, called patient, no answer 

## 2014-03-11 NOTE — Telephone Encounter (Signed)
Patient picked up Rx

## 2014-03-16 ENCOUNTER — Emergency Department (HOSPITAL_COMMUNITY): Payer: Medicaid Other

## 2014-03-16 ENCOUNTER — Encounter (HOSPITAL_COMMUNITY): Payer: Self-pay | Admitting: Emergency Medicine

## 2014-03-16 ENCOUNTER — Emergency Department (HOSPITAL_COMMUNITY)
Admission: EM | Admit: 2014-03-16 | Discharge: 2014-03-16 | Disposition: A | Discharge status: Home / Self Care | Payer: Medicaid Other | Attending: Emergency Medicine | Admitting: Emergency Medicine

## 2014-03-16 DIAGNOSIS — E780 Pure hypercholesterolemia: Secondary | ICD-10-CM | POA: Diagnosis not present

## 2014-03-16 DIAGNOSIS — Z79899 Other long term (current) drug therapy: Secondary | ICD-10-CM | POA: Diagnosis not present

## 2014-03-16 DIAGNOSIS — J40 Bronchitis, not specified as acute or chronic: Secondary | ICD-10-CM

## 2014-03-16 DIAGNOSIS — H9209 Otalgia, unspecified ear: Secondary | ICD-10-CM | POA: Diagnosis not present

## 2014-03-16 DIAGNOSIS — K219 Gastro-esophageal reflux disease without esophagitis: Secondary | ICD-10-CM | POA: Insufficient documentation

## 2014-03-16 DIAGNOSIS — Z720 Tobacco use: Secondary | ICD-10-CM | POA: Diagnosis not present

## 2014-03-16 DIAGNOSIS — Z7982 Long term (current) use of aspirin: Secondary | ICD-10-CM | POA: Diagnosis not present

## 2014-03-16 DIAGNOSIS — R05 Cough: Secondary | ICD-10-CM | POA: Diagnosis present

## 2014-03-16 DIAGNOSIS — J0100 Acute maxillary sinusitis, unspecified: Secondary | ICD-10-CM | POA: Diagnosis not present

## 2014-03-16 DIAGNOSIS — F4310 Post-traumatic stress disorder, unspecified: Secondary | ICD-10-CM | POA: Diagnosis not present

## 2014-03-16 DIAGNOSIS — F319 Bipolar disorder, unspecified: Secondary | ICD-10-CM | POA: Insufficient documentation

## 2014-03-16 DIAGNOSIS — G43909 Migraine, unspecified, not intractable, without status migrainosus: Secondary | ICD-10-CM | POA: Insufficient documentation

## 2014-03-16 DIAGNOSIS — R059 Cough, unspecified: Secondary | ICD-10-CM

## 2014-03-16 DIAGNOSIS — E119 Type 2 diabetes mellitus without complications: Secondary | ICD-10-CM | POA: Diagnosis not present

## 2014-03-16 DIAGNOSIS — J209 Acute bronchitis, unspecified: Secondary | ICD-10-CM | POA: Diagnosis not present

## 2014-03-16 DIAGNOSIS — E079 Disorder of thyroid, unspecified: Secondary | ICD-10-CM | POA: Insufficient documentation

## 2014-03-16 MED ORDER — IPRATROPIUM-ALBUTEROL 0.5-2.5 (3) MG/3ML IN SOLN
3.0000 mL | Freq: Once | RESPIRATORY_TRACT | Status: AC
  Administered 2014-03-16: 3 mL via RESPIRATORY_TRACT
  Filled 2014-03-16: qty 3

## 2014-03-16 MED ORDER — HYDROCOD POLST-CHLORPHEN POLST 10-8 MG/5ML PO LQCR: 5.0000 mL | Freq: Two times a day (BID) | ORAL | Status: DC | PRN

## 2014-03-16 MED ORDER — AMOXICILLIN-POT CLAVULANATE 875-125 MG PO TABS: 1.0000 | ORAL_TABLET | Freq: Two times a day (BID) | ORAL | Status: DC

## 2014-03-16 MED ORDER — PREDNISONE 50 MG PO TABS
60.0000 mg | ORAL_TABLET | Freq: Once | ORAL | Status: AC
  Administered 2014-03-16: 60 mg via ORAL
  Filled 2014-03-16 (×2): qty 1

## 2014-03-16 MED ORDER — PREDNISONE (PAK) 10 MG PO TABS: ORAL_TABLET | Freq: Every day | ORAL | Status: DC

## 2014-03-16 MED ORDER — HYDROCOD POLST-CHLORPHEN POLST 10-8 MG/5ML PO LQCR
5.0000 mL | Freq: Once | ORAL | Status: AC
  Administered 2014-03-16: 5 mL via ORAL
  Filled 2014-03-16: qty 5

## 2014-03-16 NOTE — ED Notes (Signed)
PT c/o nasal congestion/ sore throat and non-productive cough x1 week.

## 2014-03-16 NOTE — Discharge Instructions (Signed)
Continue to use your albuterol inhaler as needed. Follow up with your doctor. Return here as needed for worsening symptoms.

## 2014-03-16 NOTE — ED Notes (Signed)
PT reports taking mucinex and albuterol inhaler at home with some relief.

## 2014-03-16 NOTE — ED Provider Notes (Signed)
CSN: [id number]     Arrival date & time 03/16/14  1113 History  This chart was scribed for [facility], NP, working with Sharyon Cable, MD by Steva Colder, ED Scribe. The patient was seen in room APFT24/APFT24 at 1:03 PM.    Chief Complaint  Patient presents with  . Cough     Patient is a 53 y.o. female presenting with cough. The history is provided by the patient. No language interpreter was used.  Cough Cough characteristics:  Non-productive Severity:  Moderate Onset quality:  Sudden Duration:  1 week Timing:  Constant Progression:  Unchanged Chronicity:  New Smoker: yes   Relieved by:  Beta-agonist inhaler Associated symptoms: ear fullness, ear pain (fullness), sinus congestion and sore throat      HPI Comments: [name] is a 53 y.o. female who presents to the Emergency Department complaining of non-productive cough onset 1 week. She states that she is having associated symptoms of nasal congestion, pain around her eyes, sore throat, general body aches, ear fullness. Her symptoms began with a sore throat, congestion, and a stuffy head. She reports that her chest feels full when she coughs. She also notes that her ribs ache because of the constant coughing that she is doing. She states that she has tried Mucinex, Alkaselzter cold plus in the day and Nyquil at night and albuterol inhaler with mild relief for her symptoms. She denies urinary symptoms, and any other associated symptoms. She smokes 0.5 PPD but she has not smoked since the symptoms began. PCPAsencion Noble, MD   Past Medical History  Diagnosis Date  . Diabetes mellitus   . Thyroid disease   . Elevated cholesterol   . Fibromyalgia   . Anxiety   . Depression   . Bipolar disorder   . PTSD (post-traumatic stress disorder)     after death of son (accidental overdose)  . GERD (gastroesophageal reflux disease)   . Migraine    Past Surgical History  Procedure Laterality Date  . Carpal tunnel release   bilateral  . Vaginal hysterectomy    . Shoulder surgery  left  . Gallbladder    . Knee arthroscopy      bilateral  . Cholecystectomy    . Colonoscopy with propofol N/A 05/01/2012    HLK:TGYBW COLON polyps/Mild diverticulosis was noted in the sigmoid colon/Moderate sized internal hemorrhoids. path with tubular adenoma  . Polypectomy N/A 05/01/2012    Procedure: POLYPECTOMY;  Surgeon: Danie Binder, MD;  Location: [facility];  Service: Endoscopy;  Laterality: N/A;   Family History  Problem Relation Age of Onset  . Cancer    . Diabetes    . Heart disease    . Arthritis    . Lung disease    . Colon cancer Neg Hx   . Stroke Mother   . Cancer - Lung Father    History  Substance Use Topics  . Smoking status: Current Every Day Smoker -- 0.50 packs/day for 3 years  . Smokeless tobacco: Not on file  . Alcohol Use: No   OB History    No data available     Review of Systems  HENT: Positive for congestion, ear pain (fullness), sinus pressure and sore throat.   Respiratory: Positive for cough.   Genitourinary: Negative for dysuria and frequency.       No bladder incontinence  Musculoskeletal: Positive for arthralgias.  All other systems reviewed and are negative.     Allergies  Review of patient's  allergies indicates no known allergies.  Home Medications   Prior to Admission medications   Medication Sig Start Date End Date Taking? Authorizing Provider  amoxicillin-clavulanate (AUGMENTIN) 875-125 MG per tablet Take 1 tablet by mouth 2 (two) times daily. 03/16/14   Hope Bunnie Pion, NP  ARIPiprazole (ABILIFY) 20 MG tablet Take 20 mg by mouth every morning.     Historical Provider, MD  aspirin 81 MG tablet Take 81 mg by mouth every morning.     Historical Provider, MD  chlorpheniramine-HYDROcodone (TUSSIONEX PENNKINETIC ER) 10-8 MG/5ML LQCR Take 5 mLs by mouth every 12 (twelve) hours as needed for cough. 03/16/14   Hope Bunnie Pion, NP  diazepam (VALIUM) 10 MG tablet Take 10 mg by mouth 3  (three) times daily.     Historical Provider, MD  exenatide (BYETTA) 10 MCG/0.04ML SOLN Inject 10 mcg into the skin 2 (two) times daily with a meal. Pt takes 10 mcg twice daily    Historical Provider, MD  FLUoxetine (PROZAC) 20 MG capsule Take 40 mg by mouth every morning.     Historical Provider, MD  HYDROcodone-acetaminophen (NORCO/VICODIN) 5-325 MG per tablet Take 1 tablet by mouth every 6 (six) hours as needed for moderate pain. 03/10/14   Carole Civil, MD  levothyroxine (SYNTHROID, LEVOTHROID) 112 MCG tablet Take 112 mcg by mouth every morning.     Historical Provider, MD  metFORMIN (GLUCOPHAGE) 500 MG tablet Take 1,000 mg by mouth 2 (two) times daily.    Historical Provider, MD  methocarbamol (ROBAXIN) 500 MG tablet Take 1 tablet (500 mg total) by mouth 4 (four) times daily. 01/27/14   Carole Civil, MD  predniSONE (STERAPRED UNI-PAK) 10 MG tablet Take by mouth daily. Starting 03/17/14 take 5 tablets PO then 4, 3, 2, 1 03/16/14   Hope Bunnie Pion, NP  pregabalin (LYRICA) 75 MG capsule Take 75 mg by mouth at bedtime.    Historical Provider, MD  promethazine (PHENERGAN) 25 MG tablet Take 1 tablet (25 mg total) by mouth every 6 (six) hours as needed for nausea. 07/08/12   Francine Graven, DO  promethazine-codeine (PHENERGAN WITH CODEINE) 6.25-10 MG/5ML syrup Take 5 mLs by mouth every 6 (six) hours as needed for cough. 05/08/13   Lenox Ahr, PA-C  ranitidine (ZANTAC) 150 MG tablet Take 150 mg by mouth every morning.     Historical Provider, MD  simvastatin (ZOCOR) 20 MG tablet Take 40 mg by mouth at bedtime.     Historical Provider, MD  SUMAtriptan (IMITREX) 100 MG tablet One tablet po prn migraine.  May repeat in 2 hrs if needed.  Do not exceed 200 mg per 24 hrs. 06/26/12   Tammy L. Triplett, PA-C  topiramate (TOPAMAX) 100 MG tablet Take 100 mg by mouth at bedtime.    Historical Provider, MD   BP 140/70 mmHg  Pulse 74  Temp(Src) 98 F (36.7 C) (Oral)  Resp 18  Ht 5\' 8"  (1.727 m)  Wt  234 lb (106.142 kg)  BMI 35.59 kg/m2  SpO2 100%  Physical Exam  Constitutional: She is oriented to person, place, and time. She appears well-developed and well-nourished. No distress.  HENT:  Right Ear: Tympanic membrane normal.  Left Ear: Tympanic membrane normal.  Nose: Right sinus exhibits maxillary sinus tenderness. Left sinus exhibits maxillary sinus tenderness.  Mouth/Throat: Uvula is midline and mucous membranes are normal. Posterior oropharyngeal erythema (mild) present.  Maxillary sinus tenderness.  Eyes: Conjunctivae and EOM are normal. Pupils are equal, round, and reactive  to light.  Neck: Normal range of motion. Neck supple.  Cardiovascular: Normal rate.   Pulmonary/Chest: Effort normal. No respiratory distress. She has wheezes. She has no rales.  Abdominal: Soft. There is no tenderness.  Musculoskeletal: Normal range of motion.  Lymphadenopathy:    She has no cervical adenopathy.  Neurological: She is alert and oriented to person, place, and time. No cranial nerve deficit.  Skin: Skin is warm and dry.  Psychiatric: She has a normal mood and affect. Her behavior is normal.  Nursing note and vitals reviewed.   ED Course  Procedures (including critical care time) DIAGNOSTIC STUDIES: Oxygen Saturation is 100% on room air, normal by my interpretation.    COORDINATION OF CARE: 1:08 PM-Discussed treatment plan which includes CXR, abx, prednisone, breathing treatment with pt at bedside and pt agreed to plan.   1:51 PM- Pt symptoms improved with the nebulizer treatment.   Labs Review Labs Reviewed - No data to display  Imaging Review Dg Chest 2 View  03/16/2014   CLINICAL DATA:  Dry cough and shortness of breath. Intermittent fever.  EXAM: CHEST  2 VIEW  COMPARISON:  05/08/2013 and chest CT 05/14/2004  FINDINGS: Again noted is a large calcification in the subcarinal/right hilar region. Otherwise, the lungs are clear. Heart size is normal. No acute bone abnormality.   IMPRESSION: No active cardiopulmonary disease.   Electronically Signed   By: Markus Daft M.D.   On: 03/16/2014 12:05   MDM  53 y.o. female with cough, congestion and sinus tenderness x 1 week. Will treat for bronchitis and sinusitis. Encouraged patient to stop smoking. I have reviewed this patient's vital signs, nurses notes, appropriate labs and imaging.  I have discussed findings and plan of care with the patient and she voices understanding and agrees with plan.   Final diagnoses:  Bronchitis  Acute maxillary sinusitis, recurrence not specified   I personally performed the services described in this documentation, which was scribed in my presence. The recorded information has been reviewed and is accurate.    [place], NP 03/21/14 [place], MD 03/21/14 [phone]

## 2014-03-31 ENCOUNTER — Telehealth: Payer: Self-pay | Admitting: Orthopedic Surgery

## 2014-03-31 ENCOUNTER — Other Ambulatory Visit: Payer: Self-pay | Admitting: *Deleted

## 2014-03-31 DIAGNOSIS — M48061 Spinal stenosis, lumbar region without neurogenic claudication: Secondary | ICD-10-CM

## 2014-03-31 DIAGNOSIS — M170 Bilateral primary osteoarthritis of knee: Secondary | ICD-10-CM

## 2014-03-31 MED ORDER — HYDROCODONE-ACETAMINOPHEN 5-325 MG PO TABS
1.0000 | ORAL_TABLET | Freq: Four times a day (QID) | ORAL | Status: DC | PRN
Start: 2014-03-31 — End: 2014-04-21

## 2014-03-31 NOTE — Telephone Encounter (Signed)
Patient requests refill, pain medication: HYDROcodone-acetaminophen (NORCO/VICODIN) 5-325 MG per tablet [[id number]]  Phone #'s are [phone], home and [phone], cell.

## 2014-04-01 NOTE — Telephone Encounter (Signed)
Prescription available, patient aware  

## 2014-04-01 NOTE — Telephone Encounter (Signed)
Patient picked up Rx

## 2014-04-07 ENCOUNTER — Emergency Department (HOSPITAL_COMMUNITY)
Admission: EM | Admit: 2014-04-07 | Discharge: 2014-04-07 | Disposition: A | Discharge status: Home / Self Care | Payer: Medicaid Other | Attending: Emergency Medicine | Admitting: Emergency Medicine

## 2014-04-07 ENCOUNTER — Encounter (HOSPITAL_COMMUNITY): Payer: Self-pay | Admitting: Emergency Medicine

## 2014-04-07 DIAGNOSIS — G43909 Migraine, unspecified, not intractable, without status migrainosus: Secondary | ICD-10-CM | POA: Diagnosis not present

## 2014-04-07 DIAGNOSIS — E780 Pure hypercholesterolemia: Secondary | ICD-10-CM | POA: Insufficient documentation

## 2014-04-07 DIAGNOSIS — Z7952 Long term (current) use of systemic steroids: Secondary | ICD-10-CM | POA: Diagnosis not present

## 2014-04-07 DIAGNOSIS — Y998 Other external cause status: Secondary | ICD-10-CM | POA: Insufficient documentation

## 2014-04-07 DIAGNOSIS — F419 Anxiety disorder, unspecified: Secondary | ICD-10-CM | POA: Insufficient documentation

## 2014-04-07 DIAGNOSIS — Z720 Tobacco use: Secondary | ICD-10-CM | POA: Diagnosis not present

## 2014-04-07 DIAGNOSIS — Y9329 Activity, other involving ice and snow: Secondary | ICD-10-CM | POA: Diagnosis not present

## 2014-04-07 DIAGNOSIS — F319 Bipolar disorder, unspecified: Secondary | ICD-10-CM | POA: Insufficient documentation

## 2014-04-07 DIAGNOSIS — M5442 Lumbago with sciatica, left side: Secondary | ICD-10-CM

## 2014-04-07 DIAGNOSIS — M797 Fibromyalgia: Secondary | ICD-10-CM | POA: Diagnosis not present

## 2014-04-07 DIAGNOSIS — Z79899 Other long term (current) drug therapy: Secondary | ICD-10-CM | POA: Diagnosis not present

## 2014-04-07 DIAGNOSIS — E079 Disorder of thyroid, unspecified: Secondary | ICD-10-CM | POA: Insufficient documentation

## 2014-04-07 DIAGNOSIS — F4310 Post-traumatic stress disorder, unspecified: Secondary | ICD-10-CM | POA: Diagnosis not present

## 2014-04-07 DIAGNOSIS — S3992XA Unspecified injury of lower back, initial encounter: Secondary | ICD-10-CM | POA: Insufficient documentation

## 2014-04-07 DIAGNOSIS — Z8719 Personal history of other diseases of the digestive system: Secondary | ICD-10-CM | POA: Insufficient documentation

## 2014-04-07 DIAGNOSIS — E119 Type 2 diabetes mellitus without complications: Secondary | ICD-10-CM | POA: Insufficient documentation

## 2014-04-07 DIAGNOSIS — Y9289 Other specified places as the place of occurrence of the external cause: Secondary | ICD-10-CM | POA: Insufficient documentation

## 2014-04-07 DIAGNOSIS — Z7982 Long term (current) use of aspirin: Secondary | ICD-10-CM | POA: Diagnosis not present

## 2014-04-07 DIAGNOSIS — W010XXA Fall on same level from slipping, tripping and stumbling without subsequent striking against object, initial encounter: Secondary | ICD-10-CM | POA: Insufficient documentation

## 2014-04-07 MED ORDER — OXYCODONE-ACETAMINOPHEN 5-325 MG PO TABS
1.0000 | ORAL_TABLET | Freq: Once | ORAL | Status: AC
  Administered 2014-04-07: 1 via ORAL
  Filled 2014-04-07: qty 1

## 2014-04-07 MED ORDER — METHYLPREDNISOLONE 4 MG PO KIT: PACK | ORAL | Status: DC

## 2014-04-07 NOTE — ED Provider Notes (Signed)
CSN: [id number]     Arrival date & time 04/07/14  1210 History  This chart was scribed for non-physician practitioner, Deborah Chalk, NP, working with Hoy Morn, MD, by Stephania Fragmin, ED Scribe. This patient was seen in room APFT23/APFT23 and the patient's care was started at 2:20 PM.     Chief Complaint  Patient presents with  . Back Pain   Patient is a 54 y.o. female presenting with back pain. The history is provided by the patient. No language interpreter was used.  Back Pain Location:  Lumbar spine Radiates to:  L posterior upper leg, L thigh, L knee and L foot (L left) Pain severity:  Moderate Onset quality:  Sudden Duration:  3 days Timing:  Constant Progression:  Unchanged Chronicity:  New Context: falling   Relieved by:  None tried Worsened by:  Nothing tried Ineffective treatments:  Narcotics (hydrocodone) Associated symptoms: no bladder incontinence and no bowel incontinence      HPI Comments: [name] is a 54 y.o. female with a history of DM and 2 herniated discs who presents to the Emergency Department complaining of pain in her lower left back radiating down her left buttock and leg that began yesterday after she slipped on steps and landed on her buttocks. Patient has tried leftover hydrocodone from a toothache, which has been ineffective at alleviating her pain. She denies bowel or bladder incontinence. Patient was driven here today. Dr. Aline Brochure manages her chronic pain.  Past Medical History  Diagnosis Date  . Diabetes mellitus   . Thyroid disease   . Elevated cholesterol   . Fibromyalgia   . Anxiety   . Depression   . Bipolar disorder   . PTSD (post-traumatic stress disorder)     after death of son (accidental overdose)  . GERD (gastroesophageal reflux disease)   . Migraine    Past Surgical History  Procedure Laterality Date  . Carpal tunnel release  bilateral  . Vaginal hysterectomy    . Shoulder surgery  left  . Gallbladder    . Knee  arthroscopy      bilateral  . Cholecystectomy    . Colonoscopy with propofol N/A 05/01/2012    JIR:CVELF COLON polyps/Mild diverticulosis was noted in the sigmoid colon/Moderate sized internal hemorrhoids. path with tubular adenoma  . Polypectomy N/A 05/01/2012    Procedure: POLYPECTOMY;  Surgeon: Danie Binder, MD;  Location: [facility];  Service: Endoscopy;  Laterality: N/A;   Family History  Problem Relation Age of Onset  . Cancer    . Diabetes    . Heart disease    . Arthritis    . Lung disease    . Colon cancer Neg Hx   . Stroke Mother   . Cancer - Lung Father    History  Substance Use Topics  . Smoking status: Current Every Day Smoker -- 0.50 packs/day for 3 years  . Smokeless tobacco: Not on file  . Alcohol Use: No   OB History    No data available     Review of Systems  Gastrointestinal: Negative for bowel incontinence.  Genitourinary: Negative for bladder incontinence and enuresis.  Musculoskeletal: Positive for myalgias and back pain.  All other systems reviewed and are negative.     Allergies  Review of patient's allergies indicates no known allergies.  Home Medications   Prior to Admission medications   Medication Sig Start Date End Date Taking? Authorizing Provider  ARIPiprazole (ABILIFY) 20 MG tablet Take 20  mg by mouth every morning.    Yes Historical Provider, MD  aspirin EC 81 MG tablet Take 81 mg by mouth daily.   Yes Historical Provider, MD  busPIRone (BUSPAR) 10 MG tablet Take 10 mg by mouth 3 (three) times daily.   Yes Historical Provider, MD  exenatide (BYETTA) 10 MCG/0.04ML SOLN Inject 10 mcg into the skin 2 (two) times daily with a meal. Pt takes 10 mcg twice daily   Yes Historical Provider, MD  FLUoxetine (PROZAC) 20 MG capsule Take 40 mg by mouth every morning.    Yes Historical Provider, MD  gabapentin (NEURONTIN) 300 MG capsule Take 300 mg by mouth daily.   Yes Historical Provider, MD  levothyroxine (SYNTHROID, LEVOTHROID) 112 MCG tablet Take  112 mcg by mouth every morning.    Yes Historical Provider, MD  metFORMIN (GLUCOPHAGE) 500 MG tablet Take 1,000 mg by mouth 2 (two) times daily.   Yes Historical Provider, MD  methocarbamol (ROBAXIN) 500 MG tablet Take 1 tablet (500 mg total) by mouth 4 (four) times daily. 01/27/14  Yes Carole Civil, MD  topiramate (TOPAMAX) 100 MG tablet Take 100 mg by mouth at bedtime.   Yes Historical Provider, MD  amoxicillin-clavulanate (AUGMENTIN) 875-125 MG per tablet Take 1 tablet by mouth 2 (two) times daily. Patient not taking: Reported on 04/07/2014 03/16/14   Ashley Murrain, NP  chlorpheniramine-HYDROcodone [facility]) 10-8 MG/5ML LQCR Take 5 mLs by mouth every 12 (twelve) hours as needed for cough. Patient not taking: Reported on 04/07/2014 03/16/14   Ashley Murrain, NP  diazepam (VALIUM) 10 MG tablet Take 10 mg by mouth 3 (three) times daily.     Historical Provider, MD  HYDROcodone-acetaminophen (NORCO/VICODIN) 5-325 MG per tablet Take 1 tablet by mouth every 6 (six) hours as needed for moderate pain. Patient not taking: Reported on 04/07/2014 03/31/14   Carole Civil, MD  predniSONE (STERAPRED UNI-PAK) 10 MG tablet Take by mouth daily. Starting 03/17/14 take 5 tablets PO then 4, 3, 2, 1 Patient not taking: Reported on 04/07/2014 03/16/14   Ashley Murrain, NP  promethazine (PHENERGAN) 25 MG tablet Take 1 tablet (25 mg total) by mouth every 6 (six) hours as needed for nausea. 07/08/12   Francine Graven, DO  promethazine-codeine (PHENERGAN WITH CODEINE) 6.25-10 MG/5ML syrup Take 5 mLs by mouth every 6 (six) hours as needed for cough. Patient not taking: Reported on 04/07/2014 05/08/13   Lenox Ahr, PA-C  SUMAtriptan (IMITREX) 100 MG tablet One tablet po prn migraine.  May repeat in 2 hrs if needed.  Do not exceed 200 mg per 24 hrs. 06/26/12   Tammy L. Triplett, PA-C   BP 124/66 mmHg  Pulse 86  Temp(Src) 97.4 F (36.3 C) (Oral)  Resp 18  Ht 5\' 8"  (1.727 m)  Wt 230 lb (104.327 kg)   BMI 34.98 kg/m2  SpO2 98% Physical Exam  Constitutional: She is oriented to person, place, and time. She appears well-developed and well-nourished. No distress.  HENT:  Head: Normocephalic and atraumatic.  Eyes: Conjunctivae and EOM are normal.  Neck: Neck supple. No tracheal deviation present.  Cardiovascular: Normal rate.   Pulmonary/Chest: Effort normal. No respiratory distress.  Musculoskeletal: Normal range of motion. She exhibits tenderness.  Left sided paraspinal tenderness around L3-L4.   Neurological: She is alert and oriented to person, place, and time.  Skin: Skin is warm and dry.  Psychiatric: She has a normal mood and affect. Her behavior is normal.  Nursing  note and vitals reviewed.   ED Course  Procedures (including critical care time)  DIAGNOSTIC STUDIES: Oxygen Saturation is 98% on room air, normal by my interpretation.    COORDINATION OF CARE: 2:25 PM - Discussed treatment plan with pt at bedside which includes ice application to the area, Prednisone, and Percocet administered here, and pt agreed to plan.   Labs Review Labs Reviewed - No data to display  Imaging Review No results found.   EKG Interpretation None     Fall on stairs last night, landing on buttocks.  History of chronic back pain with flares of sciatica.  No new lower extremity weakness. No incontinence. MDM   Final diagnoses:  None  Low back pain. Back contusion. Sciatica.  Medrol dose pack. Patient aware of impact on blood sugar control. Return precautions discussed. Follow-up with PCP to address chronic pain management.   I personally performed the services described in this documentation, which was scribed in my presence. The recorded information has been reviewed and is accurate.   Norman Herrlich, NP 04/07/14 [place], MD 04/09/14 [phone]

## 2014-04-07 NOTE — ED Notes (Signed)
Fell down steps yesterday in snow, used heat and ice, Motrin 800mg , without relief.  Low back pain radiating to left and down leg.

## 2014-04-07 NOTE — Discharge Instructions (Signed)
Contusion A contusion is a deep bruise. Contusions happen when an injury causes bleeding under the skin. Signs of bruising include pain, puffiness (swelling), and discolored skin. The contusion may turn blue, purple, or yellow. HOME CARE   Put ice on the injured area.  Put ice in a plastic bag.  Place a towel between your skin and the bag.  Leave the ice on for 15-20 minutes, 03-04 times a day.  Only take medicine as told by your doctor.  Rest the injured area.  If possible, raise (elevate) the injured area to lessen puffiness. GET HELP RIGHT AWAY IF:   You have more bruising or puffiness.  You have pain that is getting worse.  Your puffiness or pain is not helped by medicine. MAKE SURE YOU:   Understand these instructions.  Will watch your condition.  Will get help right away if you are not doing well or get worse. Document Released: 08/24/2007 Document Revised: 05/30/2011 Document Reviewed: 01/10/2011 [facility] Patient Information 2015 [place], [place]. This information is not intended to replace advice given to you by your health care provider. Make sure you discuss any questions you have with your health care provider. Radicular Pain Radicular pain in either the arm or leg is usually from a bulging or herniated disk in the spine. A piece of the herniated disk may press against the nerves as the nerves exit the spine. This causes pain which is felt at the tips of the nerves down the arm or leg. Other causes of radicular pain may include:  Fractures.  Heart disease.  Cancer.  An abnormal and usually degenerative state of the nervous system or nerves (neuropathy). Diagnosis may require CT or MRI scanning to determine the primary cause.  Nerves that start at the neck (nerve roots) may cause radicular pain in the outer shoulder and arm. It can spread down to the thumb and fingers. The symptoms vary depending on which nerve root has been affected. In most cases radicular pain  improves with conservative treatment. Neck problems may require physical therapy, a neck collar, or cervical traction. Treatment may take many weeks, and surgery may be considered if the symptoms do not improve.  Conservative treatment is also recommended for sciatica. Sciatica causes pain to radiate from the lower back or buttock area down the leg into the foot. Often there is a history of back problems. Most patients with sciatica are better after 2 to 4 weeks of rest and other supportive care. Short term bed rest can reduce the disk pressure considerably. Sitting, however, is not a good position since this increases the pressure on the disk. You should avoid bending, lifting, and all other activities which make the problem worse. Traction can be used in severe cases. Surgery is usually reserved for patients who do not improve within the first months of treatment. Only take over-the-counter or prescription medicines for pain, discomfort, or fever as directed by your caregiver. Narcotics and muscle relaxants may help by relieving more severe pain and spasm and by providing mild sedation. Cold or massage can give significant relief. Spinal manipulation is not recommended. It can increase the degree of disc protrusion. Epidural steroid injections are often effective treatment for radicular pain. These injections deliver medicine to the spinal nerve in the space between the protective covering of the spinal cord and back bones (vertebrae). Your caregiver can give you more information about steroid injections. These injections are most effective when given within two weeks of the onset of pain.  You should see your caregiver for follow up care as recommended. A program for neck and back injury rehabilitation with stretching and strengthening exercises is an important part of management.  SEEK IMMEDIATE MEDICAL CARE IF:  You develop increased pain, weakness, or numbness in your arm or leg.  You develop  difficulty with bladder or bowel control.  You develop abdominal pain. Document Released: 04/14/2004 Document Revised: 05/30/2011 Document Reviewed: 06/30/2008 [facility] Patient Information 2015 [place], [place]. This information is not intended to replace advice given to you by your health care provider. Make sure you discuss any questions you have with your health care provider.

## 2014-04-19 ENCOUNTER — Emergency Department (HOSPITAL_COMMUNITY)
Admission: EM | Admit: 2014-04-19 | Discharge: 2014-04-19 | Disposition: A | Discharge status: Home / Self Care | Payer: Medicaid Other | Attending: Emergency Medicine | Admitting: Emergency Medicine

## 2014-04-19 ENCOUNTER — Encounter (HOSPITAL_COMMUNITY): Payer: Self-pay | Admitting: Emergency Medicine

## 2014-04-19 DIAGNOSIS — Z8679 Personal history of other diseases of the circulatory system: Secondary | ICD-10-CM | POA: Insufficient documentation

## 2014-04-19 DIAGNOSIS — K219 Gastro-esophageal reflux disease without esophagitis: Secondary | ICD-10-CM | POA: Insufficient documentation

## 2014-04-19 DIAGNOSIS — Z79899 Other long term (current) drug therapy: Secondary | ICD-10-CM | POA: Insufficient documentation

## 2014-04-19 DIAGNOSIS — F419 Anxiety disorder, unspecified: Secondary | ICD-10-CM | POA: Diagnosis not present

## 2014-04-19 DIAGNOSIS — Z792 Long term (current) use of antibiotics: Secondary | ICD-10-CM | POA: Diagnosis not present

## 2014-04-19 DIAGNOSIS — E119 Type 2 diabetes mellitus without complications: Secondary | ICD-10-CM | POA: Diagnosis not present

## 2014-04-19 DIAGNOSIS — R51 Headache: Secondary | ICD-10-CM | POA: Diagnosis not present

## 2014-04-19 DIAGNOSIS — Z7982 Long term (current) use of aspirin: Secondary | ICD-10-CM | POA: Insufficient documentation

## 2014-04-19 DIAGNOSIS — Z720 Tobacco use: Secondary | ICD-10-CM | POA: Diagnosis not present

## 2014-04-19 DIAGNOSIS — F319 Bipolar disorder, unspecified: Secondary | ICD-10-CM | POA: Insufficient documentation

## 2014-04-19 DIAGNOSIS — Z87448 Personal history of other diseases of urinary system: Secondary | ICD-10-CM | POA: Insufficient documentation

## 2014-04-19 DIAGNOSIS — R519 Headache, unspecified: Secondary | ICD-10-CM

## 2014-04-19 MED ORDER — METOCLOPRAMIDE HCL 5 MG/ML IJ SOLN
10.0000 mg | Freq: Once | INTRAMUSCULAR | Status: AC
  Administered 2014-04-19: 10 mg via INTRAVENOUS
  Filled 2014-04-19: qty 2

## 2014-04-19 MED ORDER — ONDANSETRON 4 MG PO TBDP
ORAL_TABLET | ORAL | Status: AC
  Filled 2014-04-19: qty 1

## 2014-04-19 MED ORDER — ONDANSETRON 4 MG PO TBDP
4.0000 mg | ORAL_TABLET | Freq: Once | ORAL | Status: AC
  Administered 2014-04-19: 4 mg via ORAL

## 2014-04-19 MED ORDER — DIPHENHYDRAMINE HCL 50 MG/ML IJ SOLN
25.0000 mg | Freq: Once | INTRAMUSCULAR | Status: AC
  Administered 2014-04-19: 25 mg via INTRAVENOUS
  Filled 2014-04-19: qty 1

## 2014-04-19 MED ORDER — KETOROLAC TROMETHAMINE 30 MG/ML IJ SOLN
30.0000 mg | Freq: Once | INTRAMUSCULAR | Status: AC
  Administered 2014-04-19: 30 mg via INTRAVENOUS
  Filled 2014-04-19: qty 1

## 2014-04-19 NOTE — ED Notes (Signed)
Pt reports migraine headache since yesterday. Pt reports history of same. Pt reports has taken maximum dose of imitrex with no relief. Pt reports light and sound sensitivity.

## 2014-04-19 NOTE — Discharge Instructions (Signed)
Follow up as needed

## 2014-04-19 NOTE — ED Provider Notes (Signed)
CSN: [id number]     Arrival date & time 04/19/14  1231 History   First MD Initiated Contact with Patient 04/19/14 1350     Chief Complaint  Patient presents with  . Migraine     (Consider location/radiation/quality/duration/timing/severity/associated sxs/prior Treatment) Patient is a 54 y.o. female presenting with migraines. The history is provided by the patient (the pt complains of a headache).  Migraine This is a new problem. The current episode started 1 to 2 hours ago. The problem occurs constantly. The problem has not changed since onset.Associated symptoms include headaches. Pertinent negatives include no chest pain and no abdominal pain. Nothing aggravates the symptoms. Nothing relieves the symptoms.    Past Medical History  Diagnosis Date  . Diabetes mellitus   . Thyroid disease   . Elevated cholesterol   . Fibromyalgia   . Anxiety   . Depression   . Bipolar disorder   . PTSD (post-traumatic stress disorder)     after death of son (accidental overdose)  . GERD (gastroesophageal reflux disease)   . Migraine    Past Surgical History  Procedure Laterality Date  . Carpal tunnel release  bilateral  . Vaginal hysterectomy    . Shoulder surgery  left  . Gallbladder    . Knee arthroscopy      bilateral  . Cholecystectomy    . Colonoscopy with propofol N/A 05/01/2012    [facility] COLON polyps/Mild diverticulosis was noted in the sigmoid colon/Moderate sized internal hemorrhoids. path with tubular adenoma  . Polypectomy N/A 05/01/2012    Procedure: POLYPECTOMY;  Surgeon: Danie Binder, MD;  Location: [facility];  Service: Endoscopy;  Laterality: N/A;   Family History  Problem Relation Age of Onset  . Cancer    . Diabetes    . Heart disease    . Arthritis    . Lung disease    . Colon cancer Neg Hx   . Stroke Mother   . Cancer - Lung Father    History  Substance Use Topics  . Smoking status: Current Every Day Smoker -- 0.50 packs/day for 3 years  . Smokeless tobacco:  Not on file  . Alcohol Use: No   OB History    No data available     Review of Systems  Constitutional: Negative for appetite change and fatigue.  HENT: Negative for congestion, ear discharge and sinus pressure.   Eyes: Negative for discharge.  Respiratory: Negative for cough.   Cardiovascular: Negative for chest pain.  Gastrointestinal: Negative for abdominal pain and diarrhea.  Genitourinary: Negative for frequency and hematuria.  Musculoskeletal: Negative for back pain.  Skin: Negative for rash.  Neurological: Positive for headaches. Negative for seizures.  Psychiatric/Behavioral: Negative for hallucinations.      Allergies  Review of patient's allergies indicates no known allergies.  Home Medications   Prior to Admission medications   Medication Sig Start Date End Date Taking? Authorizing Provider  ARIPiprazole (ABILIFY) 20 MG tablet Take 20 mg by mouth every morning.    Yes Historical Provider, MD  aspirin EC 81 MG tablet Take 81 mg by mouth daily.   Yes Historical Provider, MD  busPIRone (BUSPAR) 10 MG tablet Take 10 mg by mouth 3 (three) times daily.   Yes Historical Provider, MD  diazepam (VALIUM) 10 MG tablet Take 10 mg by mouth 3 (three) times daily.    Yes Historical Provider, MD  exenatide (BYETTA) 10 MCG/0.04ML SOLN Inject 10 mcg into the skin 2 (two) times daily with a  meal. Pt takes 10 mcg twice daily   Yes Historical Provider, MD  FLUoxetine (PROZAC) 20 MG capsule Take 40 mg by mouth every morning.    Yes Historical Provider, MD  gabapentin (NEURONTIN) 300 MG capsule Take 300 mg by mouth daily.   Yes Historical Provider, MD  levothyroxine (SYNTHROID, LEVOTHROID) 112 MCG tablet Take 112 mcg by mouth every morning.    Yes Historical Provider, MD  metFORMIN (GLUCOPHAGE) 500 MG tablet Take 1,000 mg by mouth 2 (two) times daily.   Yes Historical Provider, MD  methocarbamol (ROBAXIN) 500 MG tablet Take 1 tablet (500 mg total) by mouth 4 (four) times daily. 01/27/14   Yes Carole Civil, MD  SUMAtriptan (IMITREX) 100 MG tablet One tablet po prn migraine.  May repeat in 2 hrs if needed.  Do not exceed 200 mg per 24 hrs. 06/26/12  Yes Tammy L. Triplett, PA-C  topiramate (TOPAMAX) 100 MG tablet Take 100 mg by mouth at bedtime.   Yes Historical Provider, MD  amoxicillin-clavulanate (AUGMENTIN) 875-125 MG per tablet Take 1 tablet by mouth 2 (two) times daily. Patient not taking: Reported on 04/07/2014 03/16/14   Ashley Murrain, NP  chlorpheniramine-HYDROcodone [facility]) 10-8 MG/5ML LQCR Take 5 mLs by mouth every 12 (twelve) hours as needed for cough. Patient not taking: Reported on 04/07/2014 03/16/14   Ashley Murrain, NP  HYDROcodone-acetaminophen (NORCO/VICODIN) 5-325 MG per tablet Take 1 tablet by mouth every 6 (six) hours as needed for moderate pain. Patient not taking: Reported on 04/07/2014 03/31/14   Carole Civil, MD  methylPREDNISolone (MEDROL DOSEPAK) 4 MG tablet Take per package instructions Patient not taking: Reported on 04/19/2014 04/07/14   Norman Herrlich, NP  promethazine (PHENERGAN) 25 MG tablet Take 1 tablet (25 mg total) by mouth every 6 (six) hours as needed for nausea. 07/08/12   Francine Graven, DO  promethazine-codeine (PHENERGAN WITH CODEINE) 6.25-10 MG/5ML syrup Take 5 mLs by mouth every 6 (six) hours as needed for cough. Patient not taking: Reported on 04/07/2014 05/08/13   Lenox Ahr, PA-C   BP 137/59 mmHg  Pulse 72  Temp(Src) 97.7 F (36.5 C) (Oral)  Resp 14  Ht 5\' 8"  (1.727 m)  Wt 230 lb (104.327 kg)  BMI 34.98 kg/m2  SpO2 100% Physical Exam  Constitutional: She is oriented to person, place, and time. She appears well-developed.  HENT:  Head: Normocephalic.  Eyes: Conjunctivae and EOM are normal. No scleral icterus.  Neck: Neck supple. No thyromegaly present.  Cardiovascular: Normal rate and regular rhythm.  Exam reveals no gallop and no friction rub.   No murmur heard. Pulmonary/Chest: No stridor. She has  no wheezes. She has no rales. She exhibits no tenderness.  Abdominal: She exhibits no distension. There is no tenderness. There is no rebound.  Musculoskeletal: Normal range of motion. She exhibits no edema.  Lymphadenopathy:    She has no cervical adenopathy.  Neurological: She is oriented to person, place, and time. She exhibits normal muscle tone. Coordination normal.  Skin: No rash noted. No erythema.  Psychiatric: She has a normal mood and affect. Her behavior is normal.    ED Course  Procedures (including critical care time) Labs Review Labs Reviewed - No data to display  Imaging Review No results found.   EKG Interpretation None      MDM   Final diagnoses:  Headache behind the eyes    Pt improved.  Follow up as needed    Maudry Diego,  MD 04/19/14 1552

## 2014-04-19 NOTE — ED Notes (Signed)
Patient with no complaints at this time. Respirations even and unlabored. Skin warm/dry. Discharge instructions reviewed with patient at this time. Patient given opportunity to voice concerns/ask questions. IV removed per policy and band-aid applied to site. Patient discharged at this time and left Emergency Department with steady gait.  

## 2014-04-21 ENCOUNTER — Other Ambulatory Visit: Payer: Self-pay | Admitting: *Deleted

## 2014-04-21 ENCOUNTER — Telehealth: Payer: Self-pay | Admitting: Orthopedic Surgery

## 2014-04-21 DIAGNOSIS — M170 Bilateral primary osteoarthritis of knee: Secondary | ICD-10-CM

## 2014-04-21 DIAGNOSIS — M48061 Spinal stenosis, lumbar region without neurogenic claudication: Secondary | ICD-10-CM

## 2014-04-21 MED ORDER — HYDROCODONE-ACETAMINOPHEN 5-325 MG PO TABS: 1.0000 | ORAL_TABLET | Freq: Four times a day (QID) | ORAL | Status: DC | PRN

## 2014-04-21 NOTE — Telephone Encounter (Signed)
Patient picked up Rx

## 2014-04-21 NOTE — Telephone Encounter (Signed)
Patient is calling asking for a refill on pain medication HYDROcodone-acetaminophen (NORCO/VICODIN) 5-325 MG per tablet please advise?

## 2014-04-21 NOTE — Telephone Encounter (Signed)
Prescription available for pickup, called patient, unavailable, left vm

## 2014-05-12 ENCOUNTER — Telehealth: Payer: Self-pay | Admitting: Orthopedic Surgery

## 2014-05-12 ENCOUNTER — Other Ambulatory Visit: Payer: Self-pay | Admitting: *Deleted

## 2014-05-12 DIAGNOSIS — M48061 Spinal stenosis, lumbar region without neurogenic claudication: Secondary | ICD-10-CM

## 2014-05-12 DIAGNOSIS — M170 Bilateral primary osteoarthritis of knee: Secondary | ICD-10-CM

## 2014-05-12 MED ORDER — HYDROCODONE-ACETAMINOPHEN 5-325 MG PO TABS: 1.0000 | ORAL_TABLET | Freq: Four times a day (QID) | ORAL | Status: DC | PRN

## 2014-05-12 NOTE — Telephone Encounter (Signed)
Patient picked up Rx

## 2014-05-12 NOTE — Telephone Encounter (Signed)
Prescription available, patient aware  

## 2014-05-12 NOTE — Telephone Encounter (Signed)
Patient called to request refill on medication:HYDROcodone-acetaminophen (NORCO/VICODIN) 5-325 MG per tablet [[id number]] - her cell ph# is [phone]

## 2014-05-19 ENCOUNTER — Encounter: Payer: Self-pay | Admitting: Orthopedic Surgery

## 2014-05-19 ENCOUNTER — Ambulatory Visit (INDEPENDENT_AMBULATORY_CARE_PROVIDER_SITE_OTHER): Payer: Medicaid Other | Admitting: Orthopedic Surgery

## 2014-05-19 VITALS — BP 124/69 | Ht 68.0 in | Wt 230.0 lb

## 2014-05-19 DIAGNOSIS — M170 Bilateral primary osteoarthritis of knee: Secondary | ICD-10-CM

## 2014-05-19 NOTE — Addendum Note (Signed)
Addended by: Baldomero Lamy B on: 05/19/2014 02:42 PM   Modules accepted: Orders, Medications

## 2014-05-19 NOTE — Progress Notes (Signed)
Chief Complaint  Patient presents with  . Follow-up    3 mnth follow up/ repeat injections, bilateral knees     Procedure note left knee injection verbal consent was obtained to inject left knee joint  Timeout was completed to confirm the site of injection  The medications used were 40 mg of Depo-Medrol and 1% lidocaine 3 cc  Anesthesia was provided by ethyl chloride and the skin was prepped with alcohol.  After cleaning the skin with alcohol a 20-gauge needle was used to inject the left knee joint. There were no complications. A sterile bandage was applied.   Procedure note right knee injection verbal consent was obtained to inject right knee joint  Timeout was completed to confirm the site of injection  The medications used were 40 mg of Depo-Medrol and 1% lidocaine 3 cc  Anesthesia was provided by ethyl chloride and the skin was prepped with alcohol.  After cleaning the skin with alcohol a 20-gauge needle was used to inject the right knee joint. There were no complications. A sterile bandage was applied.

## 2014-06-02 ENCOUNTER — Other Ambulatory Visit: Payer: Self-pay | Admitting: *Deleted

## 2014-06-02 ENCOUNTER — Telehealth: Payer: Self-pay | Admitting: Orthopedic Surgery

## 2014-06-02 DIAGNOSIS — M170 Bilateral primary osteoarthritis of knee: Secondary | ICD-10-CM

## 2014-06-02 DIAGNOSIS — M48061 Spinal stenosis, lumbar region without neurogenic claudication: Secondary | ICD-10-CM

## 2014-06-02 MED ORDER — HYDROCODONE-ACETAMINOPHEN 5-325 MG PO TABS: 1.0000 | ORAL_TABLET | Freq: Four times a day (QID) | ORAL | Status: DC | PRN

## 2014-06-02 NOTE — Telephone Encounter (Signed)
Prescription available, called patient, no answer 

## 2014-06-02 NOTE — Telephone Encounter (Signed)
Patient picked up Rx

## 2014-06-02 NOTE — Telephone Encounter (Signed)
Patient is calling requesting pain medication refill onHYDROcodone-acetaminophen (NORCO/VICODIN) 5-325 MG per tablet please advise?  

## 2014-06-13 ENCOUNTER — Other Ambulatory Visit (HOSPITAL_COMMUNITY): Payer: Self-pay | Admitting: Internal Medicine

## 2014-06-13 DIAGNOSIS — Z1231 Encounter for screening mammogram for malignant neoplasm of breast: Secondary | ICD-10-CM

## 2014-06-23 ENCOUNTER — Other Ambulatory Visit: Payer: Self-pay | Admitting: *Deleted

## 2014-06-23 ENCOUNTER — Telehealth: Payer: Self-pay | Admitting: Orthopedic Surgery

## 2014-06-23 DIAGNOSIS — M170 Bilateral primary osteoarthritis of knee: Secondary | ICD-10-CM

## 2014-06-23 DIAGNOSIS — M48061 Spinal stenosis, lumbar region without neurogenic claudication: Secondary | ICD-10-CM

## 2014-06-23 MED ORDER — HYDROCODONE-ACETAMINOPHEN 5-325 MG PO TABS: 1.0000 | ORAL_TABLET | Freq: Four times a day (QID) | ORAL | Status: DC | PRN

## 2014-06-23 NOTE — Telephone Encounter (Signed)
Patient called for refill on medication: HYDROcodone-acetaminophen (NORCO/VICODIN) 5-325 MG per tablet [[id number]] - home [phone] (Home) or "alternate/friend's" cell# [phone]

## 2014-06-23 NOTE — Telephone Encounter (Signed)
Patient picked up Rx

## 2014-06-23 NOTE — Telephone Encounter (Signed)
Prescription available, patient aware  

## 2014-07-14 ENCOUNTER — Other Ambulatory Visit: Payer: Self-pay | Admitting: *Deleted

## 2014-07-14 ENCOUNTER — Telehealth: Payer: Self-pay | Admitting: Orthopedic Surgery

## 2014-07-14 DIAGNOSIS — M48061 Spinal stenosis, lumbar region without neurogenic claudication: Secondary | ICD-10-CM

## 2014-07-14 DIAGNOSIS — M170 Bilateral primary osteoarthritis of knee: Secondary | ICD-10-CM

## 2014-07-14 DIAGNOSIS — M25512 Pain in left shoulder: Secondary | ICD-10-CM

## 2014-07-14 MED ORDER — METHOCARBAMOL 500 MG PO TABS: 500.0000 mg | ORAL_TABLET | Freq: Four times a day (QID) | ORAL | Status: DC

## 2014-07-14 MED ORDER — HYDROCODONE-ACETAMINOPHEN 5-325 MG PO TABS: 1.0000 | ORAL_TABLET | Freq: Four times a day (QID) | ORAL | Status: DC | PRN

## 2014-07-14 NOTE — Telephone Encounter (Signed)
Patient called for refill of medication: HYDROcodone-acetaminophen (NORCO/VICODIN) 5-325 MG per tablet [[id number]] - home ph#[phone], cell# [phone]

## 2014-07-14 NOTE — Telephone Encounter (Signed)
PATIENT PICKED UP RX 

## 2014-07-14 NOTE — Telephone Encounter (Signed)
Prescription available, called patient, not available

## 2014-07-28 ENCOUNTER — Ambulatory Visit (HOSPITAL_COMMUNITY): Payer: Self-pay

## 2014-08-04 ENCOUNTER — Telehealth: Payer: Self-pay | Admitting: Orthopedic Surgery

## 2014-08-04 ENCOUNTER — Encounter: Payer: Self-pay | Admitting: Orthopedic Surgery

## 2014-08-04 ENCOUNTER — Other Ambulatory Visit: Payer: Self-pay | Admitting: *Deleted

## 2014-08-04 DIAGNOSIS — M170 Bilateral primary osteoarthritis of knee: Secondary | ICD-10-CM

## 2014-08-04 DIAGNOSIS — M48061 Spinal stenosis, lumbar region without neurogenic claudication: Secondary | ICD-10-CM

## 2014-08-04 MED ORDER — HYDROCODONE-ACETAMINOPHEN 5-325 MG PO TABS: 1.0000 | ORAL_TABLET | Freq: Four times a day (QID) | ORAL | Status: DC | PRN

## 2014-08-04 NOTE — Telephone Encounter (Signed)
Patient is calling requesting a refill on her medication HYDROcodone-acetaminophen (NORCO/VICODIN) 5-325 MG per tablet please advise?

## 2014-08-04 NOTE — Telephone Encounter (Signed)
Patient picked up Rx

## 2014-08-14 ENCOUNTER — Ambulatory Visit (HOSPITAL_COMMUNITY)
Admission: RE | Admit: 2014-08-14 | Discharge: 2014-08-14 | Disposition: A | Discharge status: Home / Self Care | Payer: Medicaid Other | Source: Ambulatory Visit | Attending: Internal Medicine | Admitting: Internal Medicine

## 2014-08-14 DIAGNOSIS — Z1231 Encounter for screening mammogram for malignant neoplasm of breast: Secondary | ICD-10-CM

## 2014-08-19 ENCOUNTER — Ambulatory Visit (INDEPENDENT_AMBULATORY_CARE_PROVIDER_SITE_OTHER): Payer: Medicaid Other | Admitting: Orthopedic Surgery

## 2014-08-19 VITALS — BP 132/62 | Ht 68.0 in | Wt 230.0 lb

## 2014-08-19 DIAGNOSIS — M170 Bilateral primary osteoarthritis of knee: Secondary | ICD-10-CM

## 2014-08-19 DIAGNOSIS — M48061 Spinal stenosis, lumbar region without neurogenic claudication: Secondary | ICD-10-CM

## 2014-08-19 MED ORDER — HYDROCODONE-ACETAMINOPHEN 5-325 MG PO TABS: 1.0000 | ORAL_TABLET | Freq: Four times a day (QID) | ORAL | Status: DC | PRN

## 2014-08-19 NOTE — Patient Instructions (Signed)
Joint Injection  Care After  Refer to this sheet in the next few days. These instructions provide you with information on caring for yourself after you have had a joint injection. Your caregiver also may give you more specific instructions. Your treatment has been planned according to current medical practices, but problems sometimes occur. Call your caregiver if you have any problems or questions after your procedure.  After any type of joint injection, it is not uncommon to experience:  · Soreness, swelling, or bruising around the injection site.  · Mild numbness, tingling, or weakness around the injection site caused by the numbing medicine used before or with the injection.  It also is possible to experience the following effects associated with the specific agent after injection:  · Iodine-based contrast agents:  ¨ Allergic reaction (itching, hives, widespread redness, and swelling beyond the injection site).  · Corticosteroids (These effects are rare.):  ¨ Allergic reaction.  ¨ Increased blood sugar levels (If you have diabetes and you notice that your blood sugar levels have increased, notify your caregiver).  ¨ Increased blood pressure levels.  ¨ Mood swings.  · Hyaluronic acid in the use of viscosupplementation.  ¨ Temporary heat or redness.  ¨ Temporary rash and itching.  ¨ Increased fluid accumulation in the injected joint.  These effects all should resolve within a day after your procedure.   HOME CARE INSTRUCTIONS  · Limit yourself to light activity the day of your procedure. Avoid lifting heavy objects, bending, stooping, or twisting.  · Take prescription or over-the-counter pain medication as directed by your caregiver.  · You may apply ice to your injection site to reduce pain and swelling the day of your procedure. Ice may be applied 03-04 times:  ¨ Put ice in a plastic bag.  ¨ Place a towel between your skin and the bag.  ¨ Leave the ice on for no longer than 15-20 minutes each time.  SEEK  IMMEDIATE MEDICAL CARE IF:   · Pain and swelling get worse rather than better or extend beyond the injection site.  · Numbness does not go away.  · Blood or fluid continues to leak from the injection site.  · You have chest pain.  · You have swelling of your face or tongue.  · You have trouble breathing or you become dizzy.  · You develop a fever, chills, or severe tenderness at the injection site that last longer than 1 day.  MAKE SURE YOU:  · Understand these instructions.  · Watch your condition.  · Get help right away if you are not doing well or if you get worse.  Document Released: 11/18/2010 Document Revised: 05/30/2011 Document Reviewed: 11/18/2010  [employer]® Patient Information ©2015 [employer]. This information is not intended to replace advice given to you by your health care provider. Make sure you discuss any questions you have with your health care provider.

## 2014-08-19 NOTE — Progress Notes (Signed)
Chief Complaint  Patient presents with  . Follow-up    3 month repeat injections on both knees.   She comes in for bilateral knee injections with her regular 3 month follow-up  Height 58 weight 2:30 blood pressure 132/62. She's having some increased pain on the medial sides of her knee. 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 she continues to have back pain  Repeat examination shows that she has no effusion today she has excellent flexion in both knees still somewhat painful with some crepitance  Seems to be still and the nonoperative treatment mode and we will inject her knees both sides come back in 3 months continue Norco for chronic pain  Procedure note left knee injection verbal consent was obtained to inject left knee joint  Timeout was completed to confirm the site of injection  The medications used were 40 mg of Depo-Medrol and 1% lidocaine 3 cc  Anesthesia was provided by ethyl chloride and the skin was prepped with alcohol.  After cleaning the skin with alcohol a 20-gauge needle was used to inject the left knee joint. There were no complications. A sterile bandage was applied.   Procedure note right knee injection verbal consent was obtained to inject right knee joint  Timeout was completed to confirm the site of injection  The medications used were 40 mg of Depo-Medrol and 1% lidocaine 3 cc  Anesthesia was provided by ethyl chloride and the skin was prepped with alcohol.  After cleaning the skin with alcohol a 20-gauge needle was used to inject the right knee joint. There were no complications. A sterile bandage was applied.

## 2014-09-05 ENCOUNTER — Encounter (HOSPITAL_COMMUNITY): Payer: Self-pay | Admitting: *Deleted

## 2014-09-05 ENCOUNTER — Emergency Department (HOSPITAL_COMMUNITY): Payer: Medicaid Other

## 2014-09-05 ENCOUNTER — Emergency Department (HOSPITAL_COMMUNITY)
Admission: EM | Admit: 2014-09-05 | Discharge: 2014-09-05 | Disposition: A | Discharge status: Home / Self Care | Payer: Medicaid Other | Attending: Emergency Medicine | Admitting: Emergency Medicine

## 2014-09-05 DIAGNOSIS — Y998 Other external cause status: Secondary | ICD-10-CM | POA: Insufficient documentation

## 2014-09-05 DIAGNOSIS — F4310 Post-traumatic stress disorder, unspecified: Secondary | ICD-10-CM | POA: Insufficient documentation

## 2014-09-05 DIAGNOSIS — Z7982 Long term (current) use of aspirin: Secondary | ICD-10-CM | POA: Insufficient documentation

## 2014-09-05 DIAGNOSIS — M25559 Pain in unspecified hip: Secondary | ICD-10-CM

## 2014-09-05 DIAGNOSIS — K219 Gastro-esophageal reflux disease without esophagitis: Secondary | ICD-10-CM | POA: Insufficient documentation

## 2014-09-05 DIAGNOSIS — M797 Fibromyalgia: Secondary | ICD-10-CM | POA: Insufficient documentation

## 2014-09-05 DIAGNOSIS — E780 Pure hypercholesterolemia: Secondary | ICD-10-CM | POA: Diagnosis not present

## 2014-09-05 DIAGNOSIS — E079 Disorder of thyroid, unspecified: Secondary | ICD-10-CM | POA: Insufficient documentation

## 2014-09-05 DIAGNOSIS — X58XXXA Exposure to other specified factors, initial encounter: Secondary | ICD-10-CM | POA: Insufficient documentation

## 2014-09-05 DIAGNOSIS — F419 Anxiety disorder, unspecified: Secondary | ICD-10-CM | POA: Insufficient documentation

## 2014-09-05 DIAGNOSIS — Y9289 Other specified places as the place of occurrence of the external cause: Secondary | ICD-10-CM | POA: Diagnosis not present

## 2014-09-05 DIAGNOSIS — F319 Bipolar disorder, unspecified: Secondary | ICD-10-CM | POA: Insufficient documentation

## 2014-09-05 DIAGNOSIS — E119 Type 2 diabetes mellitus without complications: Secondary | ICD-10-CM | POA: Diagnosis not present

## 2014-09-05 DIAGNOSIS — S79912A Unspecified injury of left hip, initial encounter: Secondary | ICD-10-CM | POA: Diagnosis not present

## 2014-09-05 DIAGNOSIS — G43909 Migraine, unspecified, not intractable, without status migrainosus: Secondary | ICD-10-CM | POA: Diagnosis not present

## 2014-09-05 DIAGNOSIS — Y9389 Activity, other specified: Secondary | ICD-10-CM | POA: Diagnosis not present

## 2014-09-05 DIAGNOSIS — Z79899 Other long term (current) drug therapy: Secondary | ICD-10-CM | POA: Diagnosis not present

## 2014-09-05 DIAGNOSIS — Z720 Tobacco use: Secondary | ICD-10-CM | POA: Diagnosis not present

## 2014-09-05 MED ORDER — FENTANYL CITRATE (PF) 100 MCG/2ML IJ SOLN
100.0000 ug | Freq: Once | INTRAMUSCULAR | Status: AC
  Administered 2014-09-05: 100 ug via INTRAVENOUS
  Filled 2014-09-05: qty 2

## 2014-09-05 MED ORDER — TRAMADOL HCL 50 MG PO TABS
50.0000 mg | ORAL_TABLET | Freq: Four times a day (QID) | ORAL | Status: DC | PRN
Start: 2014-09-05 — End: 2014-11-07

## 2014-09-05 MED ORDER — NAPROXEN 500 MG PO TABS: 500.0000 mg | ORAL_TABLET | Freq: Two times a day (BID) | ORAL | Status: DC

## 2014-09-05 NOTE — Discharge Instructions (Signed)
X-rays negative for fracture or dislocation of the left hip. Expected to be stiff for several days. Follow-up with orthopedics as needed. Take the Naprosyn on a regular basis. Take the tramadol as needed. Return for any new or worse symptoms.

## 2014-09-05 NOTE — ED Notes (Signed)
Pt states she heard and felt a pop to left hip while getting out of a high truck last night at [place]. Left hip pain since this occurred.
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
## 2014-09-15 ENCOUNTER — Other Ambulatory Visit: Payer: Self-pay | Admitting: *Deleted

## 2014-09-15 ENCOUNTER — Telehealth: Payer: Self-pay | Admitting: Orthopedic Surgery

## 2014-09-15 DIAGNOSIS — M48061 Spinal stenosis, lumbar region without neurogenic claudication: Secondary | ICD-10-CM

## 2014-09-15 DIAGNOSIS — M170 Bilateral primary osteoarthritis of knee: Secondary | ICD-10-CM

## 2014-09-15 MED ORDER — HYDROCODONE-ACETAMINOPHEN 5-325 MG PO TABS: 1.0000 | ORAL_TABLET | Freq: Four times a day (QID) | ORAL | Status: DC | PRN

## 2014-09-15 NOTE — Telephone Encounter (Signed)
Patient picked up Rx

## 2014-10-06 ENCOUNTER — Telehealth: Payer: Self-pay | Admitting: Orthopedic Surgery

## 2014-10-06 ENCOUNTER — Other Ambulatory Visit: Payer: Self-pay | Admitting: *Deleted

## 2014-10-06 DIAGNOSIS — M48061 Spinal stenosis, lumbar region without neurogenic claudication: Secondary | ICD-10-CM

## 2014-10-06 DIAGNOSIS — M170 Bilateral primary osteoarthritis of knee: Secondary | ICD-10-CM

## 2014-10-06 MED ORDER — HYDROCODONE-ACETAMINOPHEN 5-325 MG PO TABS: 1.0000 | ORAL_TABLET | Freq: Four times a day (QID) | ORAL | Status: DC | PRN

## 2014-10-06 NOTE — Telephone Encounter (Signed)
Patient picked up Rx

## 2014-10-06 NOTE — Telephone Encounter (Signed)
Prescription available, patient aware  

## 2014-10-06 NOTE — Telephone Encounter (Signed)
Patient called to request refill on medication: HYDROcodone-acetaminophen (NORCO/VICODIN) 5-325 MG per tablet [[id number]]  - cell ph# is [phone]

## 2014-10-27 ENCOUNTER — Telehealth: Payer: Self-pay | Admitting: Orthopedic Surgery

## 2014-10-27 ENCOUNTER — Other Ambulatory Visit: Payer: Self-pay | Admitting: *Deleted

## 2014-10-27 DIAGNOSIS — M170 Bilateral primary osteoarthritis of knee: Secondary | ICD-10-CM

## 2014-10-27 DIAGNOSIS — M48061 Spinal stenosis, lumbar region without neurogenic claudication: Secondary | ICD-10-CM

## 2014-10-27 MED ORDER — HYDROCODONE-ACETAMINOPHEN 5-325 MG PO TABS: 1.0000 | ORAL_TABLET | Freq: Four times a day (QID) | ORAL | Status: DC | PRN

## 2014-10-27 NOTE — Telephone Encounter (Signed)
Prescription available, patient aware  

## 2014-10-27 NOTE — Telephone Encounter (Signed)
Patient picked up Rx

## 2014-10-27 NOTE — Telephone Encounter (Signed)
Patient is calling requesting a refill on medication HYDROcodone-acetaminophen (NORCO/VICODIN) 5-325 MG per tablet  Please advise?

## 2014-11-07 ENCOUNTER — Encounter (HOSPITAL_COMMUNITY): Payer: Self-pay | Admitting: Emergency Medicine

## 2014-11-07 ENCOUNTER — Emergency Department (HOSPITAL_COMMUNITY)
Admission: EM | Admit: 2014-11-07 | Discharge: 2014-11-07 | Disposition: A | Discharge status: Other Acute Inpatient Hospital | Payer: Medicaid Other | Attending: Emergency Medicine | Admitting: Emergency Medicine

## 2014-11-07 ENCOUNTER — Inpatient Hospital Stay (HOSPITAL_COMMUNITY)
Admission: AD | Admit: 2014-11-07 | Discharge: 2014-11-10 | DRG: 885 | Disposition: A | Discharge status: Home / Self Care | Payer: Medicaid Other | Source: Intra-hospital | Attending: Psychiatry | Admitting: Psychiatry

## 2014-11-07 DIAGNOSIS — K219 Gastro-esophageal reflux disease without esophagitis: Secondary | ICD-10-CM | POA: Diagnosis present

## 2014-11-07 DIAGNOSIS — E079 Disorder of thyroid, unspecified: Secondary | ICD-10-CM | POA: Diagnosis not present

## 2014-11-07 DIAGNOSIS — G894 Chronic pain syndrome: Secondary | ICD-10-CM | POA: Diagnosis present

## 2014-11-07 DIAGNOSIS — R45851 Suicidal ideations: Secondary | ICD-10-CM | POA: Diagnosis present

## 2014-11-07 DIAGNOSIS — Z818 Family history of other mental and behavioral disorders: Secondary | ICD-10-CM | POA: Diagnosis not present

## 2014-11-07 DIAGNOSIS — Z62810 Personal history of physical and sexual abuse in childhood: Secondary | ICD-10-CM | POA: Diagnosis present

## 2014-11-07 DIAGNOSIS — F319 Bipolar disorder, unspecified: Secondary | ICD-10-CM | POA: Diagnosis not present

## 2014-11-07 DIAGNOSIS — G4700 Insomnia, unspecified: Secondary | ICD-10-CM | POA: Diagnosis present

## 2014-11-07 DIAGNOSIS — Z833 Family history of diabetes mellitus: Secondary | ICD-10-CM

## 2014-11-07 DIAGNOSIS — M797 Fibromyalgia: Secondary | ICD-10-CM | POA: Diagnosis present

## 2014-11-07 DIAGNOSIS — G43909 Migraine, unspecified, not intractable, without status migrainosus: Secondary | ICD-10-CM | POA: Diagnosis not present

## 2014-11-07 DIAGNOSIS — F4310 Post-traumatic stress disorder, unspecified: Secondary | ICD-10-CM | POA: Diagnosis not present

## 2014-11-07 DIAGNOSIS — E119 Type 2 diabetes mellitus without complications: Secondary | ICD-10-CM | POA: Diagnosis present

## 2014-11-07 DIAGNOSIS — Z720 Tobacco use: Secondary | ICD-10-CM | POA: Diagnosis not present

## 2014-11-07 DIAGNOSIS — Z823 Family history of stroke: Secondary | ICD-10-CM | POA: Diagnosis not present

## 2014-11-07 DIAGNOSIS — K760 Fatty (change of) liver, not elsewhere classified: Secondary | ICD-10-CM | POA: Diagnosis present

## 2014-11-07 DIAGNOSIS — E780 Pure hypercholesterolemia: Secondary | ICD-10-CM | POA: Diagnosis present

## 2014-11-07 DIAGNOSIS — F410 Panic disorder [episodic paroxysmal anxiety] without agoraphobia: Secondary | ICD-10-CM | POA: Diagnosis present

## 2014-11-07 DIAGNOSIS — F19951 Other psychoactive substance use, unspecified with psychoactive substance-induced psychotic disorder with hallucinations: Secondary | ICD-10-CM | POA: Diagnosis present

## 2014-11-07 DIAGNOSIS — F333 Major depressive disorder, recurrent, severe with psychotic symptoms: Secondary | ICD-10-CM | POA: Diagnosis present

## 2014-11-07 DIAGNOSIS — F1310 Sedative, hypnotic or anxiolytic abuse, uncomplicated: Secondary | ICD-10-CM | POA: Insufficient documentation

## 2014-11-07 DIAGNOSIS — Z8719 Personal history of other diseases of the digestive system: Secondary | ICD-10-CM | POA: Diagnosis not present

## 2014-11-07 DIAGNOSIS — F419 Anxiety disorder, unspecified: Secondary | ICD-10-CM | POA: Diagnosis not present

## 2014-11-07 DIAGNOSIS — F332 Major depressive disorder, recurrent severe without psychotic features: Secondary | ICD-10-CM | POA: Diagnosis not present

## 2014-11-07 DIAGNOSIS — Z008 Encounter for other general examination: Secondary | ICD-10-CM | POA: Diagnosis present

## 2014-11-07 LAB — CBC
HCT: 44.7 % (ref 36.0–46.0)
Hemoglobin: 15.5 g/dL — ABNORMAL HIGH (ref 12.0–15.0)
MCH: 32.5 pg (ref 26.0–34.0)
MCHC: 34.7 g/dL (ref 30.0–36.0)
MCV: 93.7 fL (ref 78.0–100.0)
Platelets: 316 10*3/uL (ref 150–400)
RBC: 4.77 MIL/uL (ref 3.87–5.11)
RDW: 13.1 % (ref 11.5–15.5)
WBC: 11.1 10*3/uL — ABNORMAL HIGH (ref 4.0–10.5)

## 2014-11-07 LAB — URINALYSIS, ROUTINE W REFLEX MICROSCOPIC
Bilirubin Urine: NEGATIVE
Glucose, UA: >1000 mg/dL — AB
Hgb urine dipstick: NEGATIVE
Leukocytes, UA: NEGATIVE
Nitrite: NEGATIVE
Protein, ur: NEGATIVE mg/dL
Specific Gravity, Urine: 1.02 (ref 1.005–1.030)
Urobilinogen, UA: 0.2 mg/dL (ref 0.0–1.0)
pH: 5.5 (ref 5.0–8.0)

## 2014-11-07 LAB — ACETAMINOPHEN LEVEL: Acetaminophen (Tylenol), Serum: <10 ug/mL — ABNORMAL LOW (ref 10–30)

## 2014-11-07 LAB — COMPREHENSIVE METABOLIC PANEL
ALT: 99 U/L — ABNORMAL HIGH (ref 14–54)
AST: 93 U/L — ABNORMAL HIGH (ref 15–41)
Albumin: 4.4 g/dL (ref 3.5–5.0)
Alkaline Phosphatase: 109 U/L (ref 38–126)
Anion gap: 13 (ref 5–15)
BUN: 20 mg/dL (ref 6–20)
CO2: 25 mmol/L (ref 22–32)
Calcium: 9.8 mg/dL (ref 8.9–10.3)
Chloride: 96 mmol/L — ABNORMAL LOW (ref 101–111)
Creatinine, Ser: 0.95 mg/dL (ref 0.44–1.00)
GFR calc Af Amer: >60 mL/min (ref >60)
GFR calc non Af Amer: >60 mL/min (ref >60)
Glucose, Bld: 358 mg/dL — ABNORMAL HIGH (ref 65–99)
Potassium: 4.4 mmol/L (ref 3.5–5.1)
Sodium: 134 mmol/L — ABNORMAL LOW (ref 135–145)
Total Bilirubin: 0.6 mg/dL (ref 0.3–1.2)
Total Protein: 7.9 g/dL (ref 6.5–8.1)

## 2014-11-07 LAB — ETHANOL: Alcohol, Ethyl (B): <5 mg/dL (ref <5)

## 2014-11-07 LAB — RAPID URINE DRUG SCREEN, HOSP PERFORMED
Amphetamines: NOT DETECTED
Barbiturates: NOT DETECTED
Benzodiazepines: POSITIVE — AB
Cocaine: NOT DETECTED
Opiates: NOT DETECTED
Tetrahydrocannabinol: NOT DETECTED

## 2014-11-07 LAB — URINE MICROSCOPIC-ADD ON

## 2014-11-07 LAB — SALICYLATE LEVEL: Salicylate Lvl: <4 mg/dL (ref 2.8–30.0)

## 2014-11-07 LAB — CBG MONITORING, ED
Glucose-Capillary: 371 mg/dL — ABNORMAL HIGH (ref 65–99)
Glucose-Capillary: 266 mg/dL — ABNORMAL HIGH (ref 65–99)
Glucose-Capillary: 236 mg/dL — ABNORMAL HIGH (ref 65–99)
Glucose-Capillary: 216 mg/dL — ABNORMAL HIGH (ref 65–99)

## 2014-11-07 MED ORDER — METFORMIN HCL 500 MG PO TABS
1000.0000 mg | ORAL_TABLET | Freq: Two times a day (BID) | ORAL | Status: DC
  Administered 2014-11-07 (×2): 1000 mg via ORAL
  Filled 2014-11-07 (×2): qty 2

## 2014-11-07 MED ORDER — FLUOXETINE HCL 20 MG PO CAPS
60.0000 mg | ORAL_CAPSULE | Freq: Every morning | ORAL | Status: DC
  Administered 2014-11-07: 60 mg via ORAL

## 2014-11-07 MED ORDER — EXENATIDE 10 MCG/0.04ML ~~LOC~~ SOPN: 10.0000 ug | PEN_INJECTOR | Freq: Two times a day (BID) | SUBCUTANEOUS | Status: DC

## 2014-11-07 MED ORDER — ASPIRIN EC 81 MG PO TBEC
81.0000 mg | DELAYED_RELEASE_TABLET | Freq: Every day | ORAL | Status: DC
  Administered 2014-11-07: 81 mg via ORAL
  Filled 2014-11-07: qty 1

## 2014-11-07 MED ORDER — HYDROCODONE-ACETAMINOPHEN 5-325 MG PO TABS
1.0000 | ORAL_TABLET | Freq: Four times a day (QID) | ORAL | Status: DC | PRN
  Administered 2014-11-07 (×2): 1 via ORAL
  Filled 2014-11-07 (×2): qty 1

## 2014-11-07 MED ORDER — LEVOTHYROXINE SODIUM 112 MCG PO TABS
112.0000 ug | ORAL_TABLET | Freq: Every morning | ORAL | Status: DC
  Filled 2014-11-07 (×3): qty 1

## 2014-11-07 MED ORDER — TRAZODONE HCL 50 MG PO TABS
150.0000 mg | ORAL_TABLET | Freq: Every day | ORAL | Status: DC
  Administered 2014-11-07: 150 mg via ORAL
  Filled 2014-11-07: qty 3

## 2014-11-07 MED ORDER — INSULIN ASPART 100 UNIT/ML ~~LOC~~ SOLN
0.0000 [IU] | Freq: Three times a day (TID) | SUBCUTANEOUS | Status: DC
  Administered 2014-11-07: 8 [IU] via SUBCUTANEOUS
  Filled 2014-11-07: qty 1

## 2014-11-07 MED ORDER — METHOCARBAMOL 500 MG PO TABS
500.0000 mg | ORAL_TABLET | Freq: Four times a day (QID) | ORAL | Status: DC
  Administered 2014-11-07 (×2): 500 mg via ORAL
  Filled 2014-11-07 (×2): qty 1

## 2014-11-07 MED ORDER — SIMVASTATIN 10 MG PO TABS
40.0000 mg | ORAL_TABLET | Freq: Every day | ORAL | Status: DC
  Administered 2014-11-07: 40 mg via ORAL
  Filled 2014-11-07: qty 4

## 2014-11-07 MED ORDER — FLUOXETINE HCL 20 MG PO CAPS
60.0000 mg | ORAL_CAPSULE | Freq: Every morning | ORAL | Status: DC
  Filled 2014-11-07: qty 3

## 2014-11-07 MED ORDER — DIAZEPAM 5 MG PO TABS
15.0000 mg | ORAL_TABLET | Freq: Three times a day (TID) | ORAL | Status: DC
  Administered 2014-11-07 (×2): 15 mg via ORAL
  Filled 2014-11-07 (×2): qty 3

## 2014-11-07 MED ORDER — SODIUM CHLORIDE 0.9 % IV BOLUS (SEPSIS)
1000.0000 mL | Freq: Once | INTRAVENOUS | Status: AC
  Administered 2014-11-07: 1000 mL via INTRAVENOUS

## 2014-11-07 MED ORDER — TOPIRAMATE 100 MG PO TABS
100.0000 mg | ORAL_TABLET | Freq: Every day | ORAL | Status: DC
  Administered 2014-11-07: 100 mg via ORAL
  Filled 2014-11-07 (×3): qty 1

## 2014-11-07 MED ORDER — ARIPIPRAZOLE 10 MG PO TABS
15.0000 mg | ORAL_TABLET | Freq: Every morning | ORAL | Status: DC
  Filled 2014-11-07 (×3): qty 2

## 2014-11-07 NOTE — ED Notes (Signed)
Discharge instructions given, pt demonstrated teach back and verbal understanding.transported to [facility] via  [facility]   No concerns voiced.
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
## 2014-11-07 NOTE — ED Notes (Signed)
Pt reports SI since last night. Pt reports SI with a plan to cut her wrists. Pt reports hearing voices saying to kill herself. Pt reports I always see my deceased son. Pt alert and oriented. nad noted.

## 2014-11-07 NOTE — BH Assessment (Signed)
Received notification of TTS consult request. Spoke to Merrily Pew, MD who said Pt has history of depression and increasing symptoms, suicidal ideation and auditory hallucinations. Tele-assessment will be initiated.  Orpah Greek Anson Fret, [place], [facility], [facility] Triage Specialist [phone]

## 2014-11-07 NOTE — ED Notes (Signed)
Pt given voluntary commitment paper to sign prior to transport via [facility].signed without issue. QHS medications given.
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
## 2014-11-08 ENCOUNTER — Encounter (HOSPITAL_COMMUNITY): Payer: Self-pay | Admitting: *Deleted

## 2014-11-08 DIAGNOSIS — F332 Major depressive disorder, recurrent severe without psychotic features: Secondary | ICD-10-CM | POA: Diagnosis present

## 2014-11-08 DIAGNOSIS — F19951 Other psychoactive substance use, unspecified with psychoactive substance-induced psychotic disorder with hallucinations: Secondary | ICD-10-CM | POA: Diagnosis present

## 2014-11-08 DIAGNOSIS — R45851 Suicidal ideations: Secondary | ICD-10-CM

## 2014-11-08 DIAGNOSIS — F333 Major depressive disorder, recurrent, severe with psychotic symptoms: Secondary | ICD-10-CM | POA: Diagnosis present

## 2014-11-08 DIAGNOSIS — F410 Panic disorder [episodic paroxysmal anxiety] without agoraphobia: Secondary | ICD-10-CM | POA: Diagnosis present

## 2014-11-08 DIAGNOSIS — F4310 Post-traumatic stress disorder, unspecified: Secondary | ICD-10-CM

## 2014-11-08 LAB — GLUCOSE, CAPILLARY
Glucose-Capillary: 251 mg/dL — ABNORMAL HIGH (ref 65–99)
Glucose-Capillary: 226 mg/dL — ABNORMAL HIGH (ref 65–99)
Glucose-Capillary: 234 mg/dL — ABNORMAL HIGH (ref 65–99)
Glucose-Capillary: 291 mg/dL — ABNORMAL HIGH (ref 65–99)

## 2014-11-08 MED ORDER — HYDROXYZINE HCL 25 MG PO TABS
25.0000 mg | ORAL_TABLET | Freq: Three times a day (TID) | ORAL | Status: DC | PRN
  Filled 2014-11-08: qty 9

## 2014-11-08 MED ORDER — ACETAMINOPHEN 325 MG PO TABS
650.0000 mg | ORAL_TABLET | Freq: Four times a day (QID) | ORAL | Status: DC | PRN
  Administered 2014-11-09: 650 mg via ORAL
  Filled 2014-11-08: qty 2

## 2014-11-08 MED ORDER — LEVOTHYROXINE SODIUM 112 MCG PO TABS
112.0000 ug | ORAL_TABLET | Freq: Every day | ORAL | Status: DC
  Administered 2014-11-08 – 2014-11-10 (×3): 112 ug via ORAL
  Filled 2014-11-08 (×6): qty 1

## 2014-11-08 MED ORDER — MAGNESIUM HYDROXIDE 400 MG/5ML PO SUSP: 30.0000 mL | Freq: Every day | ORAL | Status: DC | PRN

## 2014-11-08 MED ORDER — TRAZODONE HCL 150 MG PO TABS
150.0000 mg | ORAL_TABLET | Freq: Every day | ORAL | Status: DC
  Administered 2014-11-08 – 2014-11-09 (×3): 150 mg via ORAL
  Filled 2014-11-08 (×2): qty 1
  Filled 2014-11-08: qty 3
  Filled 2014-11-08 (×4): qty 1

## 2014-11-08 MED ORDER — ARIPIPRAZOLE 15 MG PO TABS
15.0000 mg | ORAL_TABLET | Freq: Every morning | ORAL | Status: DC
  Administered 2014-11-08: 15 mg via ORAL
  Filled 2014-11-08 (×3): qty 1

## 2014-11-08 MED ORDER — DIAZEPAM 5 MG PO TABS
5.0000 mg | ORAL_TABLET | Freq: Three times a day (TID) | ORAL | Status: DC | PRN
  Administered 2014-11-08 – 2014-11-09 (×2): 5 mg via ORAL
  Filled 2014-11-08 (×2): qty 1

## 2014-11-08 MED ORDER — ASPIRIN EC 81 MG PO TBEC
81.0000 mg | DELAYED_RELEASE_TABLET | Freq: Every day | ORAL | Status: DC
  Administered 2014-11-08 – 2014-11-10 (×3): 81 mg via ORAL
  Filled 2014-11-08 (×6): qty 1

## 2014-11-08 MED ORDER — ALUM & MAG HYDROXIDE-SIMETH 200-200-20 MG/5ML PO SUSP: 30.0000 mL | ORAL | Status: DC | PRN

## 2014-11-08 MED ORDER — METFORMIN HCL 500 MG PO TABS
1000.0000 mg | ORAL_TABLET | Freq: Two times a day (BID) | ORAL | Status: DC
  Administered 2014-11-08 – 2014-11-10 (×5): 1000 mg via ORAL
  Filled 2014-11-08 (×10): qty 2

## 2014-11-08 MED ORDER — SIMVASTATIN 40 MG PO TABS
40.0000 mg | ORAL_TABLET | Freq: Every day | ORAL | Status: DC
  Administered 2014-11-08 – 2014-11-09 (×2): 40 mg via ORAL
  Filled 2014-11-08 (×2): qty 1
  Filled 2014-11-08: qty 2
  Filled 2014-11-08 (×2): qty 1

## 2014-11-08 MED ORDER — INSULIN ASPART 100 UNIT/ML ~~LOC~~ SOLN
0.0000 [IU] | Freq: Three times a day (TID) | SUBCUTANEOUS | Status: DC
  Administered 2014-11-08: 3 [IU] via SUBCUTANEOUS

## 2014-11-08 MED ORDER — PRAZOSIN HCL 1 MG PO CAPS
1.0000 mg | ORAL_CAPSULE | Freq: Every day | ORAL | Status: DC
  Administered 2014-11-08 – 2014-11-09 (×2): 1 mg via ORAL
  Filled 2014-11-08: qty 1
  Filled 2014-11-08: qty 3
  Filled 2014-11-08 (×3): qty 1

## 2014-11-08 MED ORDER — DIAZEPAM 5 MG PO TABS
15.0000 mg | ORAL_TABLET | Freq: Three times a day (TID) | ORAL | Status: DC
  Administered 2014-11-08: 15 mg via ORAL
  Filled 2014-11-08: qty 3

## 2014-11-08 MED ORDER — CANAGLIFLOZIN 300 MG PO TABS
300.0000 mg | ORAL_TABLET | Freq: Every day | ORAL | Status: DC
  Administered 2014-11-08 – 2014-11-10 (×3): 300 mg via ORAL
  Filled 2014-11-08 (×5): qty 300

## 2014-11-08 MED ORDER — TOPIRAMATE 100 MG PO TABS
100.0000 mg | ORAL_TABLET | Freq: Every day | ORAL | Status: DC
  Administered 2014-11-08 – 2014-11-09 (×3): 100 mg via ORAL
  Filled 2014-11-08 (×6): qty 1

## 2014-11-08 MED ORDER — FLUOXETINE HCL 20 MG PO CAPS
60.0000 mg | ORAL_CAPSULE | Freq: Every morning | ORAL | Status: DC
  Administered 2014-11-08 – 2014-11-10 (×3): 60 mg via ORAL
  Filled 2014-11-08 (×7): qty 3

## 2014-11-08 MED ORDER — EXENATIDE 10 MCG/0.04ML ~~LOC~~ SOPN
10.0000 ug | PEN_INJECTOR | Freq: Two times a day (BID) | SUBCUTANEOUS | Status: DC
  Administered 2014-11-09 – 2014-11-10 (×3): 10 ug via SUBCUTANEOUS

## 2014-11-08 NOTE — Tx Team (Signed)
Initial Interdisciplinary Treatment Plan   PATIENT STRESSORS: Financial difficulties Health problems Medication change or noncompliance   PATIENT STRENGTHS: Ability for insight Active sense of humor Average or above average intelligence Capable of independent living Communication skills Motivation for treatment/growth Supportive family/friends   PROBLEM LIST: Problem List/Patient Goals Date to be addressed Date deferred Reason deferred Estimated date of resolution  Depression      Suicide Ideation      "They are very bad thoughts, I wish it never comes back"                                           DISCHARGE CRITERIA:  Improved stabilization in mood, thinking, and/or behavior Need for constant or close observation no longer present  PRELIMINARY DISCHARGE PLAN: Attend PHP/IOP Outpatient therapy Participate in family therapy  PATIENT/FAMIILY INVOLVEMENT: This treatment plan has been presented to and reviewed with the patient, [name], and/or family member.  The patient and family have been given the opportunity to ask questions and make suggestions.  Loyal Gambler B 11/08/2014, 12:57 AM
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
## 2014-11-08 NOTE — Progress Notes (Signed)
Pt reports that her boyfriend will be bringing her home medication to the facility per MD order. NP  notified of 1200 CBG

## 2014-11-08 NOTE — Progress Notes (Signed)
Did not attend group 

## 2014-11-08 NOTE — Progress Notes (Signed)
D:Per patient self inventory form pt reports she slept fair last night without the use of sleep medication. She reports a good appetite, low energy level, poor concentration. She rates depression 5/10, hopelessness 5/10, anxiety 5/10- all on 1-10 scale, 10 being the worse. She denies SI/HI. Denies AVH. She denies physical pain. Behavior calm and cooperative. Pleasant.  A:Special checks q 15 mins in place for safety. Medication administered per MD order (see eMAR). Encouragement and support provided. Fall precautions in place.   R:Safety maintained. No falls. Complaint with medication regimen. Will continue to monitor.

## 2014-11-08 NOTE — Plan of Care (Signed)
Problem: Diagnosis: Increased Risk For Suicide Attempt Goal: STG-Patient Will Comply With Medication Regime Outcome: Progressing Pt compliant with medication regimen     

## 2014-11-08 NOTE — Plan of Care (Signed)
Problem: Alteration in mood Goal: LTG-Patient reports reduction in suicidal thoughts (Patient reports reduction in suicidal thoughts and is able to verbalize a safety plan for whenever patient is feeling suicidal)  Outcome: Progressing Pt denies suicidal thoughts

## 2014-11-08 NOTE — BHH Group Notes (Signed)
[place] Group Notes:  (Nursing/MHT/Case Management/Adjunct)  Date:  11/08/2014  Time: 0900  Type of Therapy:  Nurse Education  Participation Level:  Active  Participation Quality:  Appropriate  Affect:  Appropriate  Cognitive:  Alert and Appropriate  Insight:  Good  Engagement in Group:  Engaged  Modes of Intervention:  Activity, Clarification, Socialization and Support  Summary of Progress/Problems:  [name] 11/08/2014, 3:23 PM

## 2014-11-08 NOTE — BHH Group Notes (Signed)
[facility] Group Notes: (Clinical Social Work)   11/08/2014      Type of Therapy:  Group Therapy   Participation Level:  Did Not Attend despite MHT prompting   Selmer Dominion, LCSW 11/08/2014, 3:58 PM

## 2014-11-08 NOTE — Progress Notes (Signed)
D: Patient in the bed on approach.  Patient states she had a good day.  Patient states she did not have a goal for today.  Patient states she feels slightly better.  Patient states finally all of her medications are correct. Patient denies SI/HI and denies AVH. A: Staff to monitor Q 15 mins for safety.  Encouragement and support offered.  Scheduled medications administered per orders.  Valium administered prn for anxiety.   R: Patient remains safe on the unit.  Patient attended group tonight. Patient visible on the unit.  Patient taking administered medications.

## 2014-11-08 NOTE — Progress Notes (Addendum)
Per patient and physician request undersign contacted Sonia Side (emergency contact in chart) via phone to bring pt medication from home to facility ( Byetta Injection) . Reports understanding.
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
## 2014-11-08 NOTE — Progress Notes (Addendum)
Visitor brought in home medication (Byetta injection) nurse placed in medication refrigerator. Visitor also brought in insulin pin needles,lancets, accuchek strips, bottle of Glipizide 10mg , bottle of Prozac 40mg  all of these items placed and locked  in locker #9.

## 2014-11-08 NOTE — Tx Team (Signed)
Admission Note:  D: Patient is a 54 year old female who presents voluntarily in no acute distress for the treatment of depression and suicide Ideation. Patient appears flat, cheerful and cooperative during the admission process.  Pt denies active SI, AH/VH. Denies pain stated "I got a pain pill before coming here". Patient reports started experiencing SI and AH since her medication change < a week. Patient reports history of depression with no SI. Patient states that are stressors are taking care of her disable sister and financial problem.  A: Skin was assessed, no abnormalities noted. Tattoo noted at the right buttocks. POC and unit policies explained and understanding verbalized. Consents obtained. Accepted food and fluids offered. R: Pt had no additional questions or concerns. Verbally contracted for safety.

## 2014-11-09 DIAGNOSIS — F410 Panic disorder [episodic paroxysmal anxiety] without agoraphobia: Secondary | ICD-10-CM

## 2014-11-09 DIAGNOSIS — F332 Major depressive disorder, recurrent severe without psychotic features: Principal | ICD-10-CM

## 2014-11-09 LAB — COMPREHENSIVE METABOLIC PANEL
ALT: 94 U/L — ABNORMAL HIGH (ref 14–54)
AST: 88 U/L — ABNORMAL HIGH (ref 15–41)
Albumin: 4.5 g/dL (ref 3.5–5.0)
Alkaline Phosphatase: 107 U/L (ref 38–126)
Anion gap: 12 (ref 5–15)
BUN: 18 mg/dL (ref 6–20)
CO2: 22 mmol/L (ref 22–32)
Calcium: 9.9 mg/dL (ref 8.9–10.3)
Chloride: 97 mmol/L — ABNORMAL LOW (ref 101–111)
Creatinine, Ser: 0.71 mg/dL (ref 0.44–1.00)
GFR calc Af Amer: >60 mL/min (ref >60)
GFR calc non Af Amer: >60 mL/min (ref >60)
Glucose, Bld: 257 mg/dL — ABNORMAL HIGH (ref 65–99)
Potassium: 4.2 mmol/L (ref 3.5–5.1)
Sodium: 131 mmol/L — ABNORMAL LOW (ref 135–145)
Total Bilirubin: 0.8 mg/dL (ref 0.3–1.2)
Total Protein: 8.3 g/dL — ABNORMAL HIGH (ref 6.5–8.1)

## 2014-11-09 LAB — GLUCOSE, CAPILLARY
Glucose-Capillary: 270 mg/dL — ABNORMAL HIGH (ref 65–99)
Glucose-Capillary: 200 mg/dL — ABNORMAL HIGH (ref 65–99)
Glucose-Capillary: 203 mg/dL — ABNORMAL HIGH (ref 65–99)
Glucose-Capillary: 206 mg/dL — ABNORMAL HIGH (ref 65–99)

## 2014-11-09 LAB — RAPID HIV SCREEN (HIV 1/2 AB+AG)
HIV 1/2 Antibodies: NONREACTIVE
HIV-1 P24 Antigen - HIV24: NONREACTIVE

## 2014-11-09 NOTE — Progress Notes (Signed)
D: Per patient self inventory form pt reports she slept fair last night. She reports fair appetite. Low energy level, good concentration. She rates depression 0/10, hopelessness 0/10, anxiety 0/10- all on 0-10 scale, 10 being the worse.  She denies SI/HI. Denies AVH. She c/o back and bilat knee pain 4/10. She reports her goal for the day is "going home" Pt attending group on the unit. Behavior calm and cooperative. Pleasant.  A:Special checks q 15 mins in place for safety. Medication administered per MD order(see eMAR), Encouragement and support provided. MD notified of CBG at 1200.  R:Safety maintained. Will continue to monitor.

## 2014-11-09 NOTE — BHH Group Notes (Signed)
[place] [facility] Group Notes:  (Nursing/MHT/Case Management/Adjunct)  Date:  11/09/2014  Time: 0900 Type of Therapy:  Nurse Education  Participation Level:  Active  Participation Quality:  Appropriate and Attentive  Affect:  Appropriate  Cognitive:  Alert and Appropriate  Insight:  Good  Engagement in Group:  Improving  Modes of Intervention:  Activity, Discussion, Education, Socialization and Support  Summary of Progress/Problems:  [name] 11/09/2014, 1:46 PM

## 2014-11-09 NOTE — Plan of Care (Signed)
Problem: Diagnosis: Increased Risk For Suicide Attempt Goal: STG-Patient Will Comply With Medication Regime Outcome: Progressing Pt compliant with medication regimen     

## 2014-11-09 NOTE — BHH Group Notes (Signed)
[facility] Group Notes:  (Clinical Social Work)  11/09/2014  1:15-2:15PM  Summary of Progress/Problems:   The main focus of today's process group was to   1)  discuss the importance of adding supports  2)  define health supports versus unhealthy supports  3)  identify the patient's current unhealthy supports and plan how to handle them  4)  Identify the patient's current healthy supports and plan what to add.  An emphasis was placed on using counselor, doctor, therapy groups, 12-step groups, and problem-specific support groups to expand supports.    The patient expressed full comprehension of the concepts presented, and agreed that there is a need to add more supports.  The patient stated her boyfriend, son, sister, and doctors are all good supports to her.  She was mostly silent, but did pay attention in group, made infrequent comments.  Type of Therapy:  Process Group with Motivational Interviewing  Participation Level:  Active  Participation Quality:  Attentive  Affect:  Blunted  Cognitive:  Alert  Insight:  Engaged  Engagement in Therapy:  Engaged  Modes of Intervention:   Education, Support and Processing, Activity  Selmer Dominion, LCSW 11/09/2014

## 2014-11-09 NOTE — Progress Notes (Signed)
Tonight in wrap up group [place] said that her day was a 9 she was able to have a good talk with her sister and boyfriend and are on good terms
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
## 2014-11-09 NOTE — Progress Notes (Signed)
D: Patient in the dayroom on approach.  Patient states she had a good day.  Patient states she had a good day but states she did not have a goal for today.  Patient states she is back on her home medications.  Patient denies SI/HI and denies AVH. A: Staff to monitor Q 15 mins for safety.  Encouragement and support offered.  Scheduled medications administered per orders.  Valium administered prn for anxiety.   R: Patient remains safe on the unit.  Patient attended group tonight. Patient visible on the unit.  Patient taking administered medications.
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
## 2014-11-10 DIAGNOSIS — F4310 Post-traumatic stress disorder, unspecified: Secondary | ICD-10-CM

## 2014-11-10 DIAGNOSIS — F19951 Other psychoactive substance use, unspecified with psychoactive substance-induced psychotic disorder with hallucinations: Secondary | ICD-10-CM

## 2014-11-10 LAB — GLUCOSE, CAPILLARY: Glucose-Capillary: 212 mg/dL — ABNORMAL HIGH (ref 65–99)

## 2014-11-10 MED ORDER — SIMVASTATIN 40 MG PO TABS: 40.0000 mg | ORAL_TABLET | Freq: Every day | ORAL | Status: AC

## 2014-11-10 MED ORDER — PRAZOSIN HCL 1 MG PO CAPS: 1.0000 mg | ORAL_CAPSULE | Freq: Every day | ORAL | Status: DC

## 2014-11-10 MED ORDER — LEVOTHYROXINE SODIUM 112 MCG PO TABS: 112.0000 ug | ORAL_TABLET | Freq: Every morning | ORAL | Status: DC

## 2014-11-10 MED ORDER — CANAGLIFLOZIN 300 MG PO TABS: 300.0000 mg | ORAL_TABLET | Freq: Every day | ORAL | Status: AC

## 2014-11-10 MED ORDER — METFORMIN HCL 500 MG PO TABS: 1000.0000 mg | ORAL_TABLET | Freq: Two times a day (BID) | ORAL | Status: DC

## 2014-11-10 MED ORDER — TRAZODONE HCL 150 MG PO TABS: 150.0000 mg | ORAL_TABLET | Freq: Every day | ORAL | Status: DC

## 2014-11-10 MED ORDER — FLUOXETINE HCL 20 MG PO CAPS: 60.0000 mg | ORAL_CAPSULE | Freq: Every morning | ORAL | Status: DC

## 2014-11-10 MED ORDER — TOPIRAMATE 100 MG PO TABS: 100.0000 mg | ORAL_TABLET | Freq: Every day | ORAL | Status: DC

## 2014-11-10 MED ORDER — HYDROXYZINE HCL 25 MG PO TABS: 25.0000 mg | ORAL_TABLET | Freq: Three times a day (TID) | ORAL | Status: DC | PRN

## 2014-11-10 MED ORDER — ASPIRIN EC 81 MG PO TBEC: 81.0000 mg | DELAYED_RELEASE_TABLET | Freq: Every day | ORAL | Status: DC

## 2014-11-10 MED ORDER — EXENATIDE 10 MCG/0.04ML ~~LOC~~ SOPN: 10.0000 ug | PEN_INJECTOR | Freq: Two times a day (BID) | SUBCUTANEOUS | Status: DC

## 2014-11-10 NOTE — Progress Notes (Signed)
Discharge note: Pt received both written and verbal discharge instructions. Pt verbalized understanding of discharge instructions. Pt agreed to f/u appt and med. Pt denies suicidal thoughts at time of discharge. Pt received belongings from med room, room and locker. Pt safely d/c'd to the lobby.

## 2014-11-10 NOTE — Tx Team (Signed)
Interdisciplinary Treatment Plan Update (Adult) Date: 11/10/2014   Date: 11/10/2014 3:17 PM  Progress in Treatment:  Attending groups: Yes  Participating in groups: Yes  Taking medication as prescribed: Yes  Tolerating medication: Yes  Family/Significant othe contact made: No, contact attempted with boyfriend, Elwin Sleight Patient understands diagnosis: Yes Discussing patient identified problems/goals with staff: Yes  Medical problems stabilized or resolved: Yes  Denies suicidal/homicidal ideation: Yes Patient has not harmed self or Others: Yes   New problem(s) identified: None identified at this time.   Discharge Plan or Barriers: Pt will return home and follow-up with [facility]  Additional comments: n/a   Reason for Continuation of Hospitalization:  Depression Medication stabilization Suicidal ideation Hallucinations  Estimated length of stay: 3-5 days  Review of initial/current patient goals per problem list:   1.  Goal(s): Patient will participate in aftercare plan  Met:  Yes  Target date: 3-5 days from date of admission   As evidenced by: Patient will participate within aftercare plan AEB aftercare provider and housing plan at discharge being identified.   11/10/14: Pt will return home and follow-up with outpatient services  2.  Goal (s): Patient will exhibit decreased depressive symptoms and suicidal ideations.  Met:  Yes  Target date: 3-5 days from date of admission   As evidenced by: Patient will utilize self rating of depression at 3 or below and demonstrate decreased signs of depression or be deemed stable for discharge by MD.  11/10/14: pt rates depression at 0/10 and denies SI.   5.  Goal(s): Patient will demonstrate decreased signs of psychosis  . Met:  Yes . Target date: 3-5 days from date of admission  . As evidenced by: Patient will demonstrate decreased frequency of AVH or return to baseline function - 11/10/14: Pt denies any AVH and no  other signs of psychosis noted.  Attendees:  Patient:    Family:    Physician: Dr. Parke Poisson, MD  11/10/2014 3:17 PM  Nursing: Lars Pinks, RN Case manager  11/10/2014 3:17 PM  Clinical Social Worker Norman Clay, MSW 11/10/2014 3:17 PM  Other: Lucinda Dell, [place] 11/10/2014 3:17 PM  Clinical:   11/10/2014 3:17 PM  Other:  11/10/2014 3:17 PM  Other:     Peri Maris, Latanya Presser MSW

## 2014-11-10 NOTE — Progress Notes (Signed)
  [facility] Adult Case Management Discharge Plan :  Will you be returning to the same living situation after discharge:  Yes,  Pt is returning to her home At discharge, do you have transportation home?: Yes,  Pt's boyfriend to provide transportation Do you have the ability to pay for your medications: Yes,  Pt provided with samples and prescriptions  Release of information consent forms completed and in the chart;  Patient's signature needed at discharge.  Patient to Follow up at: Follow-up Information    Follow up with [facility] On 11/12/2014.   Why:  at 10:30am for therapy with Ranae Palms information:   [address] [place]  [place], [place]  [address]  Ph:  [phone]  (fax) [phone]      Follow up with [facility] On 11/13/2014.   Why:  at 1:15pm for medication management.   Contact information:   [address]  [place], [place]  [address]  Ph:  [phone]  (fax) [phone]      Patient denies SI/HI: Yes,  Pt denies    Safety Planning and Suicide Prevention discussed: Yes,  with Pt; contact attempted with boyfriend. See SPE note for further assistance  Have you used any form of tobacco in the last 30 days? (Cigarettes, Smokeless Tobacco, Cigars, and/or Pipes): No  Has patient been referred to the Quitline?: N/A patient is not a smoker  Bo Mcclintock 11/10/2014, 3:14 PM

## 2014-11-10 NOTE — [facility] Suicide Risk Assessment (Signed)
[facility] INPATIENT:  Family/Significant Other Suicide Prevention Education  Suicide Prevention Education:  Contact Attempts: Elwin Sleight, Pt's boyfriend [phone], has been identified by the patient as the family member/significant other with whom the patient will be residing, and identified as the person(s) who will aid the patient in the event of a mental health crisis.  With written consent from the patient, two attempts were made to provide suicide prevention education, prior to and/or following the patient's discharge.  We were unsuccessful in providing suicide prevention education.  A suicide education pamphlet was given to the patient to share with family/significant other.  Date and time of first attempt: 11/10/14 @ 10:25am Date and time of second attempt: 11/10/14 @ 3:00pm  Bo Mcclintock 11/10/2014, 3:12 PM

## 2014-11-10 NOTE — Discharge Summary (Signed)
Physician Discharge Summary Note  Patient:  [name] is an 54 y.o., female MRN:  [id number] DOB:  October 09, 1960 Patient phone:  [phone] (home)  Patient address:   [place] [address],  Total Time spent with patient: 30 minutes  Date of Admission:  11/07/2014 Date of Discharge: 11/10/14  Reason for Admission:    Principal Problem: MDD (major depressive disorder), recurrent severe, without psychosis Discharge Diagnoses: Patient Active Problem List   Diagnosis Date Noted  . Post traumatic stress disorder (PTSD) [F43.10] 11/08/2014  . MDD (major depressive disorder), recurrent severe, without psychosis [F33.2] 11/08/2014  . 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 Panic disorder [F41.0] 11/08/2014  . Chronic pain syndrome [G89.4] 11/21/2013  . Primary osteoarthritis of both knees [M17.0] 11/21/2013  . Spinal stenosis of lumbar region [M48.06] 11/21/2013  . Difficulty in walking(719.7) [R26.2] 10/14/2013  . Sciatica associated with disorder of lumbar spine [M54.30] 09/17/2013  . Lower back pain [M54.5] 08/08/2013  . Lateral meniscus, posterior horn derangement [M23.359] 06/13/2013  . Arthritis of right knee [M12.9] 06/13/2013  . S/P medial meniscectomy of right knee [Z98.89] 06/13/2013  . Left shoulder pain [M25.512] 08/28/2012  . Rotator cuff syndrome of left shoulder [M75.102] 08/28/2012  . Rotator cuff tear [M75.100] 08/28/2012  . Pes anserinus bursitis [M71.50] 05/16/2012  . Encounter for screening colonoscopy [Z12.11] 04/16/2012  . Fatty liver [K76.0] 04/16/2012  . Back pain [M54.9] 07/26/2011  . Knee bursitis, left [M70.52] 07/26/2011  . OA (osteoarthritis) of knee [M17.9] 07/26/2011  . Spinal stenosis [M48.00] 10/28/2010  . Medial meniscus, posterior horn derangement [M23.329] 08/12/2010  . Knee pain [M25.569] 08/04/2010  . Sciatica [M54.30] 08/04/2010  . H N P-LUMBAR [M51.26] 01/14/2010  .  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 LUMBOSACRAL STRAIN [S33.8XXA] 05/12/2009  . DERANGEMENT OF POSTERIOR HORN OF MEDIAL MENISCUS [M23.329] 01/19/2009  . BACK PAIN [M54.9] 01/19/2009  . LOWER LEG, ARTHRITIS, DEGEN./OSTEO [M17.9] 12/02/2008  . JOINT EFFUSION, KNEE [M25.48] 11/10/2008  . [place], RIGHT [M76.899] 11/10/2008  . DERANGEMENT MENISCUS [M23.302] 10/15/2008  . KNEE PAIN [M25.569] 10/15/2008    Musculoskeletal: Strength & Muscle Tone: within normal limits Gait & Station: normal Patient leans: N/A  Psychiatric Specialty Exam: Physical Exam  Review of Systems  Constitutional: Negative.   HENT: Negative.   Eyes: Negative.   Respiratory: Negative.   Cardiovascular: Negative.   Gastrointestinal: Negative.   Genitourinary: Negative.   Musculoskeletal: Negative.   Skin: Negative.   Neurological: Negative.   Endo/Heme/Allergies: Negative.   Psychiatric/Behavioral: Positive for depression (Stabilized with treatments). Negative for suicidal ideas, hallucinations, memory loss and substance abuse. The patient is not nervous/anxious and does not have insomnia.     Blood pressure 103/74, pulse 106, temperature 97.5 F (36.4 C), temperature source Oral, resp. rate 18, height _0  (1.778 m), weight 102.967 kg (227 lb), SpO2 96 %.Body mass index is 32.57 kg/(m^2).  See Physician SRA     Have you used any form of tobacco in the last 30 days? (Cigarettes, Smokeless Tobacco, Cigars, and/or Pipes): No  Has this patient used any form of tobacco in the last 30 days? (Cigarettes, Smokeless Tobacco, Cigars, and/or Pipes) No  Past Medical History:  Past Medical History  Diagnosis Date  . Diabetes mellitus   . Thyroid disease   . Elevated cholesterol   . Fibromyalgia   . Anxiety   . Depression   . Bipolar disorder   . PTSD (post-traumatic stress disorder)  after death of son (accidental overdose)  . GERD (gastroesophageal reflux disease)   . Migraine      Past Surgical History  Procedure Laterality Date  . Carpal tunnel release  bilateral  . Vaginal hysterectomy    . Shoulder surgery  left  . Gallbladder    . Knee arthroscopy      bilateral  . Cholecystectomy    . Colonoscopy with propofol N/A 05/01/2012    UEA:VWUJW COLON polyps/Mild diverticulosis was noted in the sigmoid colon/Moderate sized internal hemorrhoids. path with tubular adenoma  . Polypectomy N/A 05/01/2012    Procedure: POLYPECTOMY;  Surgeon: Danie Binder, MD;  Location: [facility];  Service: Endoscopy;  Laterality: N/A;   Family History:  Family History  Problem Relation Age of Onset  . Cancer    . Diabetes    . Heart disease    . Arthritis    . Lung disease    . Colon cancer Neg Hx   . Stroke Mother   . Anxiety disorder Mother   . Cancer - Lung Father    Social History:  History  Alcohol Use No     History  Drug Use No    Social History   Social History  . Marital Status: Single    Spouse Name: N/A  . Number of Children: N/A  . Years of Education: N/A   Occupational History  . unemployed    Social History Main Topics  . Smoking status: Current Every Day Smoker -- 0.50 packs/day for 3 years  . Smokeless tobacco: None  . Alcohol Use: No  . Drug Use: No  . Sexual Activity: Yes    Birth Control/ Protection: Surgical   Other Topics Concern  . None   Social History Narrative   Risk to Self: Is patient at risk for suicide?: Yes What has been your use of drugs/alcohol within the last 12 months?: Pt denies Risk to Others:   Prior Inpatient Therapy:   Prior Outpatient Therapy:    Level of Care:  OP  Hospital Course:   [name] is an 54 y.o. female, divorce, white who presents unaccompanied to [facility] ED reporting symptoms of depression including suicidal ideation. Pt reports she has a history of depression and is currently receiving outpatient medication management with Dr. Curt Bears and therapy with Dr. Ronnie Doss. Pt reports she has felt  increasingly depressed for the past week with symptoms including crying spells, social withdrawal, loss of interest in usual pleasures, decreased concentration, decreased sleep, decreased appetite, fatigue, racing thoughts, excessive worry and feeling of hopelessness. Pt reports over the past two days she has experienced suicidal ideation and last night had a plan to cut or stab herself with a knife. Pt also report hearing a voice last night telling her "kill yourself" and she thought there was someone in the house speaking to her. Pt reports she has never experienced auditory hallucinations in the past. She reports she has never had active suicidal ideation before and has never acted on suicidal thoughts. Pt denies any history of intentional self-injurious behaviors. She reports her son died of an overdose ten years ago and occasionally she sees him. Pt denies any homicidal ideation or history of violence. Pt denies any history of alcohol or substance use.         [name] was admitted to the adult 400 unit where she was evaluated and her symptoms were identified. Medication management was discussed and implemented. Patient was continued on  Prozac for treatment of depression. She was encouraged to participate in unit programming. Medical problems were identified and treated appropriately. Home medication was restarted to continue management of chronic medical problems such as  She was evaluated each day by a clinical provider to ascertain the patient's response to treatment.  Improvement was noted by the patient's report of decreasing symptoms, improved sleep and appetite, affect, medication tolerance, behavior, and participation in unit programming.  The patient was asked each day to complete a self inventory noting mood, mental status, pain, new symptoms, anxiety and concerns.         She responded well to medication and being in a therapeutic and supportive environment. Positive and appropriate  behavior was noted and the patient was motivated for recovery.  She worked closely with the treatment team and case manager to develop a discharge plan with appropriate goals. Coping skills, problem solving as well as relaxation therapies were also part of the unit programming.         By the day of discharge she was in much improved condition than upon admission.  Symptoms were reported as significantly decreased or resolved completely. The patient denied SI/HI and voiced no AVH. She was motivated to continue taking medication with a goal of continued improvement in mental health.   [name] was discharged home with a plan to follow up as noted below. The patient was provided with three day sample medications and prescriptions at time of discharge. She left [facility] in stable condition with all belongings returned to her.    Consults:  None  Significant Diagnostic Studies:  Blood glucose monitoring, Chemistry profile, CBC, UDS  Discharge Vitals:   Blood pressure 103/74, pulse 106, temperature 97.5 F (36.4 C), temperature source Oral, resp. rate 18, height _0  (1.778 m), weight 102.967 kg (227 lb), SpO2 96 %. Body mass index is 32.57 kg/(m^2). Lab Results:   Results for orders placed or performed during the hospital encounter of 11/07/14 (from the past 72 hour(s))  Glucose, capillary     Status: Abnormal   Collection Time: 11/08/14  6:32 AM  Result Value Ref Range   Glucose-Capillary 251 (H) 65 - 99 mg/dL  Glucose, capillary     Status: Abnormal   Collection Time: 11/08/14 11:59 AM  Result Value Ref Range   Glucose-Capillary 226 (H) 65 - 99 mg/dL  Glucose, capillary     Status: Abnormal   Collection Time: 11/08/14  4:54 PM  Result Value Ref Range   Glucose-Capillary 234 (H) 65 - 99 mg/dL  Glucose, capillary     Status: Abnormal   Collection Time: 11/08/14  8:54 PM  Result Value Ref Range   Glucose-Capillary 291 (H) 65 - 99 mg/dL  Comprehensive metabolic panel     Status: Abnormal    Collection Time: 11/09/14  6:35 AM  Result Value Ref Range   Sodium 131 (L) 135 - 145 mmol/L   Potassium 4.2 3.5 - 5.1 mmol/L   Chloride 97 (L) 101 - 111 mmol/L   CO2 22 22 - 32 mmol/L   Glucose, Bld 257 (H) 65 - 99 mg/dL   BUN 18 6 - 20 mg/dL   Creatinine, Ser 0.71 0.44 - 1.00 mg/dL   Calcium 9.9 8.9 - 10.3 mg/dL   Total Protein 8.3 (H) 6.5 - 8.1 g/dL   Albumin 4.5 3.5 - 5.0 g/dL   AST 88 (H) 15 - 41 U/L   ALT 94 (H) 14 - 54 U/L   Alkaline  Phosphatase 107 38 - 126 U/L   Total Bilirubin 0.8 0.3 - 1.2 mg/dL   GFR calc non Af Amer >60 >60 mL/min   GFR calc Af Amer >60 >60 mL/min    Comment: (NOTE) The eGFR has been calculated using the CKD EPI equation. This calculation has not been validated in all clinical situations. eGFR's persistently <60 mL/min signify possible Chronic Kidney Disease.    Anion gap 12 5 - 15    Comment: Performed at [facility]  Glucose, capillary     Status: Abnormal   Collection Time: 11/09/14  6:35 AM  Result Value Ref Range   Glucose-Capillary 270 (H) 65 - 99 mg/dL  Glucose, capillary     Status: Abnormal   Collection Time: 11/09/14 12:02 PM  Result Value Ref Range   Glucose-Capillary 200 (H) 65 - 99 mg/dL   Comment 1 Notify RN   Glucose, capillary     Status: Abnormal   Collection Time: 11/09/14  4:42 PM  Result Value Ref Range   Glucose-Capillary 203 (H) 65 - 99 mg/dL   Comment 1 Notify RN   Rapid HIV screen (HIV 1/2 Ab+Ag)     Status: None   Collection Time: 11/09/14  7:24 PM  Result Value Ref Range   HIV-1 P24 Antigen - HIV24 NON REACTIVE NON REACTIVE   HIV 1/2 Antibodies NON REACTIVE NON REACTIVE   Interpretation (HIV Ag Ab)      A non reactive test result means that HIV 1 or HIV 2 antibodies and HIV 1 p24 antigen were not detected in the specimen.    Comment: RESULT CALLED TO, READ BACK BY AND VERIFIED WITH: Ezequiel Essex RN [id number] 11/09/14 A NAVARRO Performed at [facility]   Glucose, capillary      Status: Abnormal   Collection Time: 11/09/14  8:41 PM  Result Value Ref Range   Glucose-Capillary 206 (H) 65 - 99 mg/dL  Glucose, capillary     Status: Abnormal   Collection Time: 11/10/14 11:57 AM  Result Value Ref Range   Glucose-Capillary 212 (H) 65 - 99 mg/dL    Physical Findings: AIMS: Facial and Oral Movements Muscles of Facial Expression: None, normal Lips and Perioral Area: None, normal Jaw: None, normal Tongue: None, normal,Extremity Movements Upper (arms, wrists, hands, fingers): None, normal Lower (legs, knees, ankles, toes): None, normal, Trunk Movements Neck, shoulders, hips: None, normal, Overall Severity Severity of abnormal movements (highest score from questions above): None, normal Incapacitation due to abnormal movements: None, normal Patient's awareness of abnormal movements (rate only patient's report): No Awareness, Dental Status Current problems with teeth and/or dentures?: No Does patient usually wear dentures?: No  CIWA:    COWS:      See Psychiatric Specialty Exam and Suicide Risk Assessment completed by Attending Physician prior to discharge.  Discharge destination:  Home  Is patient on multiple antipsychotic therapies at discharge:  No   Has Patient had three or more failed trials of antipsychotic monotherapy by history:  No  Recommended Plan for Multiple Antipsychotic Therapies: NA  Discharge Instructions    Discharge instructions    Complete by:  As directed   Please follow up with your Primary Care Provider as scheduled for further management and medication refills for chronic medical problems.            Medication List    STOP taking these medications        ARIPiprazole 20 MG tablet  Commonly known as:  ABILIFY     diazepam 10 MG tablet  Commonly known as:  VALIUM     methocarbamol 500 MG tablet  Commonly known as:  ROBAXIN      TAKE these medications      Indication   aspirin EC 81 MG tablet  Take 1 tablet (81 mg total)  by mouth daily.   Indication:  Blood Clot     canagliflozin 300 MG Tabs tablet  Commonly known as:  INVOKANA  Take 300 mg by mouth daily.   Indication:  Type 2 Diabetes     exenatide 10 MCG/0.04ML Sopn injection  Commonly known as:  BYETTA  Inject 0.04 mLs (10 mcg total) into the skin 2 (two) times daily with a meal. Pt takes 10 mcg twice daily   Indication:  Type 2 Diabetes     FLUoxetine 20 MG capsule  Commonly known as:  PROZAC  Take 3 capsules (60 mg total) by mouth every morning.   Indication:  Major Depressive Disorder     HYDROcodone-acetaminophen 5-325 MG per tablet  Commonly known as:  NORCO/VICODIN  Take 1 tablet by mouth every 6 (six) hours as needed for moderate pain.      hydrOXYzine 25 MG tablet  Commonly known as:  ATARAX/VISTARIL  Take 1 tablet (25 mg total) by mouth 3 (three) times daily as needed for anxiety (sleep).   Indication:  Anxiety Neurosis     levothyroxine 112 MCG tablet  Commonly known as:  SYNTHROID, LEVOTHROID  Take 1 tablet (112 mcg total) by mouth every morning.   Indication:  Underactive Thyroid     metFORMIN 500 MG tablet  Commonly known as:  GLUCOPHAGE  Take 2 tablets (1,000 mg total) by mouth 2 (two) times daily.   Indication:  Type 2 Diabetes     prazosin 1 MG capsule  Commonly known as:  MINIPRESS  Take 1 capsule (1 mg total) by mouth at bedtime.   Indication:  Nightmares     simvastatin 40 MG tablet  Commonly known as:  ZOCOR  Take 1 tablet (40 mg total) by mouth daily.   Indication:  Inherited Heterozygous Hypercholesterolemia     SUMAtriptan 100 MG tablet  Commonly known as:  IMITREX  One tablet po prn migraine.  May repeat in 2 hrs if needed.  Do not exceed 200 mg per 24 hrs.      topiramate 100 MG tablet  Commonly known as:  TOPAMAX  Take 1 tablet (100 mg total) by mouth at bedtime.   Indication:  Migraine Headache     traZODone 150 MG tablet  Commonly known as:  DESYREL  Take 1 tablet (150 mg total) by mouth at  bedtime.   Indication:  Trouble Sleeping           Follow-up Information    Follow up with [facility] On 11/12/2014.   Why:  at 10:30am for therapy with Ranae Palms information:   [address] [place]  [place], [place]  [address]  Ph:  [phone]  (fax) [phone]      Follow up with [facility] On 11/13/2014.   Why:  at 1:15pm for medication management.   Contact information:   [address] [place]  [place], [place]  [address]  Ph:  [phone]  (fax) [phone]      Follow-up recommendations:    Activity: as tolerated  Diet: diabetic diet, heart healthy Tests: NA Other: see below  Comments:   Take all your medications as prescribed by your mental healthcare provider.  Report any adverse effects and or reactions from your medicines to your outpatient provider promptly.  Patient is instructed and cautioned to not engage in alcohol and or illegal drug use while on prescription medicines.  In the event of worsening symptoms, patient is instructed to call the crisis hotline, 911 and or go to the nearest ED for appropriate evaluation and treatment of symptoms.  Follow-up with your primary care provider for your other medical issues, concerns and or health care needs.   Total Discharge Time: Greater than 30 minutes  Signed: Elmarie Shiley, NP-C 11/10/2014, 3:17 PM   Patient seen, Suicide Assessment Completed.  Disposition Plan Reviewed

## 2014-11-10 NOTE — Progress Notes (Signed)
Recreation Therapy Notes  Date: 08.22.2016 Time: 9:30am Location: [address] Dayroom   Group Topic: Stress Management  Goal Area(s) Addresses:  Patient will actively participate in stress management techniques presented during session.   Behavioral Response: Did not attend.   Laureen Ochs Shamiyah Ngu, LRT/CTRS  Jacinta Penalver L 11/10/2014 1:48 PM

## 2014-11-10 NOTE — [facility] Suicide Risk Assessment (Signed)
[facility] Discharge Suicide Risk Assessment   Demographic Factors:  54 year old female   Total Time spent with patient: 30 minutes  Musculoskeletal: Strength & Muscle Tone: within normal limits Gait & Station: normal Patient leans: N/A  Psychiatric Specialty Exam: Physical Exam  ROS  Blood pressure 103/74, pulse 106, temperature 97.5 F (36.4 C), temperature source Oral, resp. rate 18, height 5\' 10"  (1.778 m), weight 227 lb (102.967 kg), SpO2 96 %.Body mass index is 32.57 kg/(m^2).  General Appearance: Well Groomed  Eye Contact::  Good  Speech:  Normal Rate409  Volume:  Normal  Mood:  improved, states she is feeling better and not depressed at this time   Affect:  Appropriate and Full Range  Thought Process:  Goal Directed and Linear  Orientation:  Full (Time, Place, and Person)  Thought Content:  no hallucinations, no delusions  Suicidal Thoughts:  No at this time denies any suicidal ideations or any self injurious ideations  Homicidal Thoughts:  No  Memory:  recent and remote grossly intact   Judgement:  Other:  improved   Insight:  improved   Psychomotor Activity:  Normal  Concentration:  Good  Recall:  Good  Fund of Knowledge:Good  Language: Good  Akathisia:  Negative  Handed:  Right  AIMS (if indicated):     Assets:  Communication Skills Desire for Improvement Resilience  Sleep:  Number of Hours: 6  Cognition: WNL  ADL's: improved    Have you used any form of tobacco in the last 30 days? (Cigarettes, Smokeless Tobacco, Cigars, and/or Pipes): No  Has this patient used any form of tobacco in the last 30 days? (Cigarettes, Smokeless Tobacco, Cigars, and/or Pipes) No  Mental Status Per Nursing Assessment::   On Admission:     Current Mental Status by Physician: At this time patient is improved, denies depression, states she is feeling "OK", presents with appropriate range of affect, no thought disorder, denies suicidal or homicidal ideations, no hallucinations, no  delusions, future oriented .   Loss Factors: death of son   Historical Factors: Depression, history of PTSD   Risk Reduction Factors:   Sense of responsibility to family, Living with another person, especially a relative and Positive coping skills or problem solving skills  Continued Clinical Symptoms:   As noted, currently improved , mood improved, denying depression, no SI  Cognitive Features That Contribute To Risk:  No gross cognitive deficits noted upon discharge. Is alert , attentive, and oriented x 3   Suicide Risk:  Mild:  Suicidal ideation of limited frequency, intensity, duration, and specificity.  There are no identifiable plans, no associated intent, mild dysphoria and related symptoms, good self-control (both objective and subjective assessment), few other risk factors, and identifiable protective factors, including available and accessible social support.  Principal Problem: MDD (major depressive disorder), recurrent severe, without psychosis Discharge Diagnoses:  Patient Active Problem List   Diagnosis Date Noted  . Post traumatic stress disorder (PTSD) [F43.10] 11/08/2014  . MDD (major depressive disorder), recurrent severe, without psychosis [F33.2] 11/08/2014  . 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 Panic disorder [F41.0] 11/08/2014  . Chronic pain syndrome [G89.4] 11/21/2013  . Primary osteoarthritis of both knees [M17.0] 11/21/2013  . Spinal stenosis of lumbar region [M48.06] 11/21/2013  . Difficulty in walking(719.7) [R26.2] 10/14/2013  . Sciatica associated with disorder of lumbar spine [M54.30] 09/17/2013  . Lower back pain [M54.5] 08/08/2013  . Lateral meniscus, posterior horn derangement [M23.359] 06/13/2013  . Arthritis of  right knee [M12.9] 06/13/2013  . S/P medial meniscectomy of right knee [Z98.89] 06/13/2013  . Left shoulder pain [M25.512] 08/28/2012  . Rotator cuff syndrome of left shoulder [M75.102]  08/28/2012  . Rotator cuff tear [M75.100] 08/28/2012  . Pes anserinus bursitis [M71.50] 05/16/2012  . Encounter for screening colonoscopy [Z12.11] 04/16/2012  . Fatty liver [K76.0] 04/16/2012  . Back pain [M54.9] 07/26/2011  . Knee bursitis, left [M70.52] 07/26/2011  . OA (osteoarthritis) of knee [M17.9] 07/26/2011  . Spinal stenosis [M48.00] 10/28/2010  . Medial meniscus, posterior horn derangement [M23.329] 08/12/2010  . Knee pain [M25.569] 08/04/2010  . Sciatica [M54.30] 08/04/2010  . H N P-LUMBAR [M51.26] 01/14/2010  . 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 LUMBOSACRAL STRAIN [S33.8XXA] 05/12/2009  . DERANGEMENT OF POSTERIOR HORN OF MEDIAL MENISCUS [M23.329] 01/19/2009  . BACK PAIN [M54.9] 01/19/2009  . LOWER LEG, ARTHRITIS, DEGEN./OSTEO [M17.9] 12/02/2008  . JOINT EFFUSION, KNEE [M25.48] 11/10/2008  . [place], RIGHT [M76.899] 11/10/2008  . DERANGEMENT MENISCUS [M23.302] 10/15/2008  . KNEE PAIN [M25.569] 10/15/2008    Follow-up Information    Follow up with [facility] On 11/12/2014.   Why:  at 10:30am for therapy with Ranae Palms information:   [address], [place]  [place], [place]  [address]  Ph:  [phone]  (fax) [phone]      Follow up with [facility] On 11/13/2014.   Why:  at 1:15pm for medication management.   Contact information:   [address] [place]  [place], [place]  [address]  Ph:  [phone]  (fax) [phone]      Plan Of Care/Follow-up recommendations:  Activity:  as tolerated  Diet:  diabetic diet, heart healthy Tests:  NA Other:  see below   Is patient on multiple antipsychotic therapies at discharge:  No   Has Patient had three or more failed trials of antipsychotic monotherapy by history:  No  Recommended Plan for Multiple Antipsychotic Therapies: NA Patient is requesting discharge and there are currently no grounds for involuntary commitment  Patient is leaving unit in good  spirits . Plans to return home . Follow up as above . States she has PCP- I have encouraged her to follow up with PCP for Diabetic management and to discuss / work up chronic transaminase elevation. Patient aware and agrees .     Yariela Tison 11/10/2014, 1:26 PM

## 2014-11-10 NOTE — [facility] Group Notes (Signed)
[facility] LCSW Aftercare Discharge Planning Group Note  11/10/2014 8:45 AM  Participation Quality: Alert, Appropriate and Oriented  Mood/Affect: "Great"; Appropriate affect  Depression Rating: 0  Anxiety Rating: 0  Thoughts of Suicide: Pt denies SI/HI  Will you contract for safety? Yes  Current AVH: Pt denies  Plan for Discharge/Comments: Pt attended discharge planning group and actively participated in group. CSW discussed suicide prevention education with the group and encouraged them to discuss discharge planning and any relevant barriers. Pt reports that she is feeling "great" and has established outpatient providers. States that she has a home to return to. No immediate needs expressed.  Transportation Means: Pt reports access to transportation  Supports: No supports mentioned at this time  Peri Maris, [place] 11/10/2014 9:30 AM

## 2014-11-11 LAB — HEPATITIS C ANTIBODY (REFLEX): HCV Ab: <0.1 s/co ratio (ref 0.0–0.9)

## 2014-11-11 LAB — HCV COMMENT:

## 2014-11-11 LAB — HEPATITIS B SURFACE ANTIGEN: Hepatitis B Surface Ag: NEGATIVE

## 2014-11-11 LAB — GLUCOSE, CAPILLARY: Glucose-Capillary: 234 mg/dL — ABNORMAL HIGH (ref 65–99)

## 2014-11-17 ENCOUNTER — Other Ambulatory Visit: Payer: Self-pay | Admitting: *Deleted

## 2014-11-17 ENCOUNTER — Telehealth: Payer: Self-pay | Admitting: Orthopedic Surgery

## 2014-11-17 DIAGNOSIS — M170 Bilateral primary osteoarthritis of knee: Secondary | ICD-10-CM

## 2014-11-17 DIAGNOSIS — M48061 Spinal stenosis, lumbar region without neurogenic claudication: Secondary | ICD-10-CM

## 2014-11-17 MED ORDER — HYDROCODONE-ACETAMINOPHEN 5-325 MG PO TABS: 1.0000 | ORAL_TABLET | Freq: Four times a day (QID) | ORAL | Status: DC | PRN

## 2014-11-17 NOTE — Telephone Encounter (Signed)
Patient picked up prescription.

## 2014-11-17 NOTE — Telephone Encounter (Signed)
Prescription available, patient aware  

## 2014-11-17 NOTE — Telephone Encounter (Signed)
Call from patient to request medication refill: HYDROcodone-acetaminophen (NORCO/VICODIN) 5-325 MG per tablet [[id number]] - asks if we would call her at alternate ph# [phone].

## 2014-11-20 ENCOUNTER — Ambulatory Visit (INDEPENDENT_AMBULATORY_CARE_PROVIDER_SITE_OTHER): Payer: Medicaid Other | Admitting: Orthopedic Surgery

## 2014-11-20 VITALS — BP 113/64 | Ht 68.0 in | Wt 258.0 lb

## 2014-11-20 DIAGNOSIS — Z9889 Other specified postprocedural states: Secondary | ICD-10-CM

## 2014-11-20 DIAGNOSIS — M4806 Spinal stenosis, lumbar region: Secondary | ICD-10-CM | POA: Diagnosis not present

## 2014-11-20 DIAGNOSIS — M170 Bilateral primary osteoarthritis of knee: Secondary | ICD-10-CM

## 2014-11-20 DIAGNOSIS — M48061 Spinal stenosis, lumbar region without neurogenic claudication: Secondary | ICD-10-CM

## 2014-11-20 NOTE — Progress Notes (Signed)
Patient ID: [name], female   DOB: Nov 09, 1960, 54 y.o.   MRN: [id number]  Follow up visit  Chief Complaint  Patient presents with  . Follow-up    3 month recheck with repeat injections in both knees.    BP 113/64 mmHg  Ht 5\' 8"  (1.727 m)  Wt 258 lb (117.028 kg)  BMI 39.24 kg/m2  No diagnosis found.  The patient comes in for bilateral injections of both knees. She complains that the right knee is giving her more problems and giving way on her.  I reexamined her knee her knee is completely stable she's not had any injuries. She says her back has been bothering her quite a bit lately and she had an MRI of that in 2015   Review of that report revealed the following findings  FINDINGS: Normal conus tip at L2.  Normal paraspinal soft tissues.   L1-2:  Normal.   L2-3: Tiny disc bulges to the right and left of midline with no neural impingement, unchanged.   L3-4: Tiny broad-based disc bulge slightly compressing the thecal sac with no neural impingement. Minimal hypertrophy of the ligamentum flavum. No change since the prior study.   L4-5: Normal disc. Minimal degenerative changes of the facet joints. No neural impingement.   L5-S1: Normal disc. Minimal degenerative changes of the facet joints.   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 No neural impingement.     Electronically Signed   By: Rozetta Nunnery M.D.   On: 11/13/2013 12:01   Since we really don't handle back problems of this nature I've advised her to see a neurosurgeon for further definitive management.  I refilled her pain medicine yet 2 days ago  We reinjected her right and left knee today  Procedure note left knee injection verbal consent was obtained to inject left knee joint  Timeout was completed to confirm the site of injection  The medications used were 40 mg of Depo-Medrol and 1% lidocaine 3 cc  Anesthesia was provided  by ethyl chloride and the skin was prepped with alcohol.  After cleaning the skin with alcohol a 20-gauge needle was used to inject the left knee joint. There were no complications. A sterile bandage was applied.   Procedure note right knee injection verbal consent was obtained to inject right knee joint  Timeout was completed to confirm the site of injection  The medications used were 40 mg of Depo-Medrol and 1% lidocaine 3 cc  Anesthesia was provided by ethyl chloride and the skin was prepped with alcohol.  After cleaning the skin with alcohol a 20-gauge needle was used to inject the right knee joint. There were no complications. A sterile bandage was applied.

## 2014-11-20 NOTE — Patient Instructions (Addendum)
Refer to Neurosurgeon, she has medicaid.

## 2014-11-21 ENCOUNTER — Telehealth: Payer: Self-pay | Admitting: *Deleted

## 2014-11-21 NOTE — Telephone Encounter (Signed)
DR Aline Brochure RECOMMENDS REFERRAL TO NEUROSURGERY  PATIENT HAS MEDICAID WHICH REQUIRES REFERRAL FROM PCP  RECOMMENDATION AND NOTES FAXED TO PCP DR Willey Blade

## 2014-12-08 ENCOUNTER — Other Ambulatory Visit: Payer: Self-pay | Admitting: *Deleted

## 2014-12-08 ENCOUNTER — Telehealth: Payer: Self-pay | Admitting: Orthopedic Surgery

## 2014-12-08 DIAGNOSIS — M48061 Spinal stenosis, lumbar region without neurogenic claudication: Secondary | ICD-10-CM

## 2014-12-08 DIAGNOSIS — M170 Bilateral primary osteoarthritis of knee: Secondary | ICD-10-CM

## 2014-12-08 MED ORDER — HYDROCODONE-ACETAMINOPHEN 5-325 MG PO TABS: 1.0000 | ORAL_TABLET | Freq: Four times a day (QID) | ORAL | Status: DC | PRN

## 2014-12-08 NOTE — Telephone Encounter (Signed)
Patient picked up Rx

## 2014-12-08 NOTE — Telephone Encounter (Signed)
Patient returned call, and has come in and picked up prescription.

## 2014-12-08 NOTE — Telephone Encounter (Signed)
Prescription available, called patient, no answer 

## 2014-12-08 NOTE — Telephone Encounter (Signed)
Patient is calling requesting a refill on HYDROcodone-acetaminophen (NORCO/VICODIN) 5-325 MG per tablet  Please advise?

## 2014-12-10 ENCOUNTER — Other Ambulatory Visit (HOSPITAL_COMMUNITY): Payer: Self-pay | Admitting: Neurosurgery

## 2014-12-10 DIAGNOSIS — M5441 Lumbago with sciatica, right side: Secondary | ICD-10-CM

## 2014-12-10 DIAGNOSIS — M5442 Lumbago with sciatica, left side: Principal | ICD-10-CM

## 2014-12-23 ENCOUNTER — Ambulatory Visit (HOSPITAL_COMMUNITY)
Admission: RE | Admit: 2014-12-23 | Discharge: 2014-12-23 | Disposition: A | Discharge status: Home / Self Care | Payer: Medicaid Other | Source: Ambulatory Visit | Attending: Neurosurgery | Admitting: Neurosurgery

## 2014-12-23 DIAGNOSIS — M5136 Other intervertebral disc degeneration, lumbar region: Secondary | ICD-10-CM | POA: Diagnosis not present

## 2014-12-23 DIAGNOSIS — M545 Low back pain: Secondary | ICD-10-CM | POA: Diagnosis not present

## 2014-12-23 DIAGNOSIS — M5441 Lumbago with sciatica, right side: Secondary | ICD-10-CM

## 2014-12-23 DIAGNOSIS — M5442 Lumbago with sciatica, left side: Secondary | ICD-10-CM

## 2014-12-26 ENCOUNTER — Other Ambulatory Visit: Payer: Self-pay | Admitting: Neurosurgery

## 2014-12-26 DIAGNOSIS — M5416 Radiculopathy, lumbar region: Secondary | ICD-10-CM

## 2014-12-29 ENCOUNTER — Other Ambulatory Visit: Payer: Self-pay | Admitting: *Deleted

## 2014-12-29 ENCOUNTER — Telehealth: Payer: Self-pay | Admitting: Orthopedic Surgery

## 2014-12-29 DIAGNOSIS — M170 Bilateral primary osteoarthritis of knee: Secondary | ICD-10-CM

## 2014-12-29 DIAGNOSIS — M48061 Spinal stenosis, lumbar region without neurogenic claudication: Secondary | ICD-10-CM

## 2014-12-29 MED ORDER — HYDROCODONE-ACETAMINOPHEN 5-325 MG PO TABS: 1.0000 | ORAL_TABLET | Freq: Four times a day (QID) | ORAL | Status: DC | PRN

## 2014-12-29 NOTE — Telephone Encounter (Signed)
Patient Picked up Rx 

## 2014-12-29 NOTE — Telephone Encounter (Signed)
Prescription available, patient aware  

## 2014-12-31 ENCOUNTER — Ambulatory Visit
Admission: RE | Admit: 2014-12-31 | Discharge: 2014-12-31 | Disposition: A | Discharge status: Home / Self Care | Payer: Medicaid Other | Source: Ambulatory Visit | Attending: Neurosurgery | Admitting: Neurosurgery

## 2014-12-31 DIAGNOSIS — M5416 Radiculopathy, lumbar region: Secondary | ICD-10-CM

## 2014-12-31 MED ORDER — IOHEXOL 180 MG/ML  SOLN
1.0000 mL | Freq: Once | INTRAMUSCULAR | Status: DC | PRN
  Administered 2014-12-31: 1 mL via EPIDURAL

## 2014-12-31 MED ORDER — METHYLPREDNISOLONE ACETATE 40 MG/ML INJ SUSP (RADIOLOG
120.0000 mg | Freq: Once | INTRAMUSCULAR | Status: AC
  Administered 2014-12-31: 120 mg via EPIDURAL

## 2014-12-31 NOTE — Discharge Instructions (Signed)

## 2015-01-01 ENCOUNTER — Telehealth: Payer: Self-pay

## 2015-01-01 NOTE — Telephone Encounter (Signed)
Patient called today to report a worsening of her low back pain and related symptoms since her Left L3-4 ESI here yesterday.  She states her back pain still is on the left side but has also moved to the right side.  She is in "so much pain that (she) can't cough or have a bowel movement."  She has taken pain medication, applied ice to the site and tried being in various positions without any relief.  I told her that while her situation sounded extreme, it is not unusual for the injection to aggravate her symptoms.  She agreed to "tough it out for the rest of the day" and will call me tomorrow if things are not improving.  Brita Romp, RN

## 2015-01-19 ENCOUNTER — Telehealth: Payer: Self-pay | Admitting: Orthopedic Surgery

## 2015-01-19 ENCOUNTER — Other Ambulatory Visit: Payer: Self-pay | Admitting: *Deleted

## 2015-01-19 DIAGNOSIS — M170 Bilateral primary osteoarthritis of knee: Secondary | ICD-10-CM

## 2015-01-19 DIAGNOSIS — M48061 Spinal stenosis, lumbar region without neurogenic claudication: Secondary | ICD-10-CM

## 2015-01-19 MED ORDER — HYDROCODONE-ACETAMINOPHEN 5-325 MG PO TABS: 1.0000 | ORAL_TABLET | Freq: Four times a day (QID) | ORAL | Status: DC | PRN

## 2015-01-19 NOTE — Telephone Encounter (Signed)
Patient stopped by to request refill on pain medication; in addition, states she is scheduled to see neurosurgeon Dr Joya Salm 01/29/15 regarding surgery, and states that the current pain medication strength is not working.  Please advise regarding refill and/or increase of strength (advised Dr Aline Brochure does not increase dose).  Her ph#: [phone]

## 2015-01-20 NOTE — Telephone Encounter (Signed)
Prescription available, patient aware  

## 2015-02-02 ENCOUNTER — Telehealth: Payer: Self-pay | Admitting: Orthopedic Surgery

## 2015-02-02 ENCOUNTER — Other Ambulatory Visit: Payer: Self-pay | Admitting: *Deleted

## 2015-02-02 DIAGNOSIS — M170 Bilateral primary osteoarthritis of knee: Secondary | ICD-10-CM

## 2015-02-02 NOTE — Telephone Encounter (Signed)
CANE ORDERED AND FAXED TO [facility]

## 2015-02-02 NOTE — Telephone Encounter (Signed)
Patient called to request prescription for a cane through [place].  Please advise patient if the order/Rx can be faxed there.

## 2015-02-09 ENCOUNTER — Other Ambulatory Visit: Payer: Self-pay | Admitting: *Deleted

## 2015-02-09 ENCOUNTER — Telehealth: Payer: Self-pay | Admitting: Orthopedic Surgery

## 2015-02-09 DIAGNOSIS — M48061 Spinal stenosis, lumbar region without neurogenic claudication: Secondary | ICD-10-CM

## 2015-02-09 DIAGNOSIS — M170 Bilateral primary osteoarthritis of knee: Secondary | ICD-10-CM

## 2015-02-09 MED ORDER — HYDROCODONE-ACETAMINOPHEN 5-325 MG PO TABS: 1.0000 | ORAL_TABLET | Freq: Four times a day (QID) | ORAL | Status: DC | PRN

## 2015-02-09 NOTE — Telephone Encounter (Signed)
Patient called to request refill, pain medication: HYDROcodone-acetaminophen (NORCO/VICODIN) 5-325 MG tablet [id number] - home ph# [phone]

## 2015-02-10 NOTE — Telephone Encounter (Signed)
Patient called back; aware; picked up prescription today, 02/10/15.

## 2015-02-10 NOTE — Telephone Encounter (Signed)
Prescription available, called patient, no answer 

## 2015-02-16 ENCOUNTER — Telehealth: Payer: Self-pay | Admitting: Orthopedic Surgery

## 2015-02-16 NOTE — Telephone Encounter (Signed)
Patient has called to relay that she has been asked about Xrays on both knees, per her disability reviewer.  Patient is asking if she may need to have new Xrays for her upcoming appointment on 02/19/15, for which she is scheduled for repeat injections, bilateral knees.  Her last right knee Xray was 07/13/13, and left knee was last done 04/10/12.  Please advise if need to order Xrays, and patient said she does not mind going to [facility] to have done.  [phone] (Home)

## 2015-02-17 ENCOUNTER — Telehealth: Payer: Self-pay | Admitting: *Deleted

## 2015-02-17 ENCOUNTER — Ambulatory Visit (INDEPENDENT_AMBULATORY_CARE_PROVIDER_SITE_OTHER): Payer: Medicaid Other | Admitting: Podiatry

## 2015-02-17 ENCOUNTER — Encounter: Payer: Self-pay | Admitting: Podiatry

## 2015-02-17 VITALS — BP 102/62 | HR 86 | Resp 12

## 2015-02-17 DIAGNOSIS — B351 Tinea unguium: Secondary | ICD-10-CM

## 2015-02-17 DIAGNOSIS — R0989 Other specified symptoms and signs involving the circulatory and respiratory systems: Secondary | ICD-10-CM

## 2015-02-17 DIAGNOSIS — M79674 Pain in right toe(s): Secondary | ICD-10-CM

## 2015-02-17 DIAGNOSIS — M79675 Pain in left toe(s): Secondary | ICD-10-CM

## 2015-02-17 DIAGNOSIS — M79676 Pain in unspecified toe(s): Secondary | ICD-10-CM

## 2015-02-17 NOTE — Telephone Encounter (Signed)
Do them here

## 2015-02-17 NOTE — Telephone Encounter (Addendum)
[employer] prior authorization started for [id number].  [place], [place] [id number], VALID 02/07/2015 - 03/19/2015.  Faxed to [facility].

## 2015-02-17 NOTE — Patient Instructions (Signed)
Today your diabetic foot examination demonstrated decreased feeling in your right and left feet There was difficulty palpating the posterior tibial pulses (pulses inside ankles) Our office will schedule a examination for lower extremity arterial Doppler to check circulation in your legs and feet and will notify you of the results of the lab and if any follow-up is needed The right and left great toenails have some deformity and residual growth and thickness which was debrided today. After your shower using a pumice stone or a callus file smoothy right and left great toenails slightly every day to prevent excessive thickening  Diabetes and Foot Care Diabetes may cause you to have problems because of poor blood supply (circulation) to your feet and legs. This may cause the skin on your feet to become thinner, break easier, and heal more slowly. Your skin may become dry, and the skin may peel and crack. You may also have nerve damage in your legs and feet causing decreased feeling in them. You may not notice minor injuries to your feet that could lead to infections or more serious problems. Taking care of your feet is one of the most important things you can do for yourself.  HOME CARE INSTRUCTIONS  Wear shoes at all times, even in the house. Do not go barefoot. Bare feet are easily injured.  Check your feet daily for blisters, cuts, and redness. If you cannot see the bottom of your feet, use a mirror or ask someone for help.  Wash your feet with warm water (do not use hot water) and mild soap. Then pat your feet and the areas between your toes until they are completely dry. Do not soak your feet as this can dry your skin.  Apply a moisturizing lotion or petroleum jelly (that does not contain alcohol and is unscented) to the skin on your feet and to dry, brittle toenails. Do not apply lotion between your toes.  Trim your toenails straight across. Do not dig under them or around the cuticle. File the  edges of your nails with an emery board or nail file.  Do not cut corns or calluses or try to remove them with medicine.  Wear clean socks or stockings every day. Make sure they are not too tight. Do not wear knee-high stockings since they may decrease blood flow to your legs.  Wear shoes that fit properly and have enough cushioning. To break in new shoes, wear them for just a few hours a day. This prevents you from injuring your feet. Always look in your shoes before you put them on to be sure there are no objects inside.  Do not cross your legs. This may decrease the blood flow to your feet.  If you find a minor scrape, cut, or break in the skin on your feet, keep it and the skin around it clean and dry. These areas may be cleansed with mild soap and water. Do not cleanse the area with peroxide, alcohol, or iodine.  When you remove an adhesive bandage, be sure not to damage the skin around it.  If you have a wound, look at it several times a day to make sure it is healing.  Do not use heating pads or hot water bottles. They may burn your skin. If you have lost feeling in your feet or legs, you may not know it is happening until it is too late.  Make sure your health care provider performs a complete foot exam at least annually or  more often if you have foot problems. Report any cuts, sores, or bruises to your health care provider immediately. SEEK MEDICAL CARE IF:   You have an injury that is not healing.  You have cuts or breaks in the skin.  You have an ingrown nail.  You notice redness on your legs or feet.  You feel burning or tingling in your legs or feet.  You have pain or cramps in your legs and feet.  Your legs or feet are numb.  Your feet always feel cold. SEEK IMMEDIATE MEDICAL CARE IF:   There is increasing redness, swelling, or pain in or around a wound.  There is a red line that goes up your leg.  Pus is coming from a wound.  You develop a fever or as  directed by your health care provider.  You notice a bad smell coming from an ulcer or wound.   This information is not intended to replace advice given to you by your health care provider. Make sure you discuss any questions you have with your health care provider.   Document Released: 03/04/2000 Document Revised: 11/07/2012 Document Reviewed: 08/14/2012 Elsevier Interactive Patient Education [employer].

## 2015-02-17 NOTE — Progress Notes (Signed)
   Subjective:    Patient ID: [name], female    DOB: Sep 20, 1960, 54 y.o.   MRN: [id number]  HPI     This patient presents today requesting evaluation of a discolored tender in the left hallux nail resulting from patient pulling a sock over a residual nail fragment causing local bleeding and discomfort in the area. At this time the symptoms have gradually improved over time with self treatment including application of peroxide to the area. The hallux nails have been removed for permanent correction approximate 10+ years ago, however, there is partial regrowth in both areas.  Patient is also questioning debridement of remaining nails  Patient is a type II diabetic and denies any history of ulceration, claudication or amputation  Review of Systems  Musculoskeletal: Positive for myalgias, back pain, joint swelling and gait problem.       Objective:   Physical Exam  Orientated 3  Vascular: No peripheral edema bilaterally DP pulses 2/4 bilaterally PT pulses 0/4 bilaterally Capillary reflex immediate bilaterally  Neurological: Sensation to 10 g monofilament wire intact 3/5 right and 2/5 left Vibratory sensation reactive bilaterally Ankle reflex reactive bilaterally  Dermatological: Residual hypertrophic nail plates hallux bilaterally The left hallux nail has some residual dried blood within the nail plate without any surrounding erythema, edema, warmth or drainage. After debridement of this nail plate the dried blood is easily removed and the underlying nailbed appears within normal limits The right hallux nails hypertrophic with deformity and residual nail  Musculoskeletal: Is no restriction ankle, subtalar, midtarsal joints bilaterally      Assessment & Plan:   Assessment: Decreased posterior tibial pulses bilaterally Residual mycotic hallux toenails hallux bilaterally without a clinical sign infection  Plan: Today review the results of examination with  patient I'm recommending that patient have a lower extremity arterial Doppler for the indication of nonpalpable posterior tibial pulses and diabetes  The hallux nails are debrided mechanically and legs without a bleeding. Patient is advised that she could gently pumice or use a callus file to the nail bed on right and left hallux nails after showering or bathing to minimize the thickening of the hallux nails  Notify patient upon receipt of vascular examination

## 2015-02-19 ENCOUNTER — Ambulatory Visit (INDEPENDENT_AMBULATORY_CARE_PROVIDER_SITE_OTHER): Payer: Medicaid Other

## 2015-02-19 ENCOUNTER — Ambulatory Visit: Payer: Medicaid Other

## 2015-02-19 ENCOUNTER — Ambulatory Visit (INDEPENDENT_AMBULATORY_CARE_PROVIDER_SITE_OTHER): Payer: Medicaid Other | Admitting: Orthopedic Surgery

## 2015-02-19 VITALS — BP 106/60 | Ht 66.0 in | Wt 226.0 lb

## 2015-02-19 DIAGNOSIS — M25562 Pain in left knee: Secondary | ICD-10-CM | POA: Diagnosis not present

## 2015-02-19 DIAGNOSIS — M25561 Pain in right knee: Secondary | ICD-10-CM

## 2015-02-19 DIAGNOSIS — M170 Bilateral primary osteoarthritis of knee: Secondary | ICD-10-CM | POA: Diagnosis not present

## 2015-02-19 NOTE — Progress Notes (Signed)
F/U  Chief Complaint  Patient presents with  . Follow-up    3 month follow up + xrays bilateral knees, repeat injections   Procedure note left knee injection verbal consent was obtained to inject left knee joint  Timeout was completed to confirm the site of injection  The medications used were 40 mg of Depo-Medrol and 1% lidocaine 3 cc  Anesthesia was provided by ethyl chloride and the skin was prepped with alcohol.  After cleaning the skin with alcohol a 20-gauge needle was used to inject the left knee joint. There were no complications. A sterile bandage was applied.   Procedure note right knee injection verbal consent was obtained to inject right knee joint  Timeout was completed to confirm the site of injection  The medications used were 40 mg of Depo-Medrol and 1% lidocaine 3 cc  Anesthesia was provided by ethyl chloride and the skin was prepped with alcohol.  After cleaning the skin with alcohol a 20-gauge needle was used to inject the right knee joint. There were no complications. A sterile bandage was applied.

## 2015-02-19 NOTE — Telephone Encounter (Signed)
02/18/15 Scheduled accordingly; patient aware.

## 2015-02-26 ENCOUNTER — Ambulatory Visit (HOSPITAL_COMMUNITY)
Admission: RE | Admit: 2015-02-26 | Discharge: 2015-02-26 | Disposition: A | Discharge status: Home / Self Care | Payer: Medicaid Other | Source: Ambulatory Visit | Attending: Cardiovascular Disease | Admitting: Cardiovascular Disease

## 2015-02-26 DIAGNOSIS — R0989 Other specified symptoms and signs involving the circulatory and respiratory systems: Secondary | ICD-10-CM

## 2015-02-26 DIAGNOSIS — E119 Type 2 diabetes mellitus without complications: Secondary | ICD-10-CM | POA: Insufficient documentation

## 2015-02-26 DIAGNOSIS — R202 Paresthesia of skin: Secondary | ICD-10-CM | POA: Diagnosis not present

## 2015-03-02 ENCOUNTER — Telehealth: Payer: Self-pay | Admitting: *Deleted

## 2015-03-02 ENCOUNTER — Other Ambulatory Visit: Payer: Self-pay | Admitting: *Deleted

## 2015-03-02 DIAGNOSIS — M48061 Spinal stenosis, lumbar region without neurogenic claudication: Secondary | ICD-10-CM

## 2015-03-02 DIAGNOSIS — M170 Bilateral primary osteoarthritis of knee: Secondary | ICD-10-CM

## 2015-03-02 MED ORDER — HYDROCODONE-ACETAMINOPHEN 5-325 MG PO TABS: 1.0000 | ORAL_TABLET | Freq: Four times a day (QID) | ORAL | Status: DC | PRN

## 2015-03-02 NOTE — Telephone Encounter (Signed)
Patient collected rx.  

## 2015-03-02 NOTE — Telephone Encounter (Signed)
Patient called requesting her hydrocodone to be refilled. Please advise [phone]

## 2015-03-02 NOTE — Telephone Encounter (Signed)
Patient is aware of her rx being ready

## 2015-03-03 ENCOUNTER — Telehealth: Payer: Self-pay | Admitting: *Deleted

## 2015-03-03 NOTE — Telephone Encounter (Deleted)
-----   Message from Gean Birchwood, DPM sent at 03/03/2015  8:01 AM EST ----- Results of lower extremity arterial Doppler dated 02/26/2015  No evidence of segmental lower extremity arterial disease at rest, bilaterally Normal ABIs bilaterally Normal great toe brachial indices bilaterally  Contact patient and advised her vascular examination was within normal limits and no follow-up needed.  Called pt at home phone, and was told could contact pt's alternate phone.  Alternate phone number is not in service.

## 2015-03-03 NOTE — Telephone Encounter (Addendum)
-----   Message from Gean Birchwood, DPM sent at 03/03/2015  8:01 AM EST ----- Results of lower extremity arterial Doppler dated 02/26/2015  No evidence of segmental lower extremity arterial disease at rest, bilaterally Normal ABIs bilaterally Normal great toe brachial indices bilaterally  Contact patient and advised her vascular examination was within normal limits and no follow-up needed.  Called pt's home number and informed Sonia Side that if pt would call my office I would give her the results.  03/04/2015 - Pt states she got my calls, and she will be out of the house today, and I may leave a message.  Left message informing pt of results.

## 2015-03-04 ENCOUNTER — Telehealth: Payer: Self-pay | Admitting: Orthopedic Surgery

## 2015-03-04 NOTE — Telephone Encounter (Signed)
Patient called to request brace for Right knee, due to hers being very worn out; states Dr Aline Brochure had recently written a prescription for brace for left knee.  Please advise.  [phone]

## 2015-03-04 NOTE — Telephone Encounter (Signed)
ROUTING TO DR HARRISON FOR REVIEW 

## 2015-03-05 ENCOUNTER — Telehealth: Payer: Self-pay | Admitting: *Deleted

## 2015-03-05 NOTE — Telephone Encounter (Addendum)
-----   Message from Gean Birchwood, DPM sent at 03/03/2015  8:01 AM EST ----- Results of lower extremity arterial Doppler dated 02/26/2015  No evidence of segmental lower extremity arterial disease at rest, bilaterally Normal ABIs bilaterally Normal great toe brachial indices bilaterally  Contact patient and advised her vascular examination was within normal limits and no follow-up needed.  Results called to pt.

## 2015-03-09 ENCOUNTER — Encounter (HOSPITAL_COMMUNITY): Payer: Self-pay | Admitting: *Deleted

## 2015-03-09 ENCOUNTER — Other Ambulatory Visit: Payer: Self-pay | Admitting: *Deleted

## 2015-03-09 ENCOUNTER — Emergency Department (HOSPITAL_COMMUNITY)
Admission: EM | Admit: 2015-03-09 | Discharge: 2015-03-09 | Disposition: A | Discharge status: Home / Self Care | Payer: Medicaid Other | Attending: Emergency Medicine | Admitting: Emergency Medicine

## 2015-03-09 DIAGNOSIS — Z87891 Personal history of nicotine dependence: Secondary | ICD-10-CM | POA: Insufficient documentation

## 2015-03-09 DIAGNOSIS — Z23 Encounter for immunization: Secondary | ICD-10-CM | POA: Diagnosis not present

## 2015-03-09 DIAGNOSIS — Z794 Long term (current) use of insulin: Secondary | ICD-10-CM | POA: Diagnosis not present

## 2015-03-09 DIAGNOSIS — Z7984 Long term (current) use of oral hypoglycemic drugs: Secondary | ICD-10-CM | POA: Diagnosis not present

## 2015-03-09 DIAGNOSIS — Z79899 Other long term (current) drug therapy: Secondary | ICD-10-CM | POA: Diagnosis not present

## 2015-03-09 DIAGNOSIS — M797 Fibromyalgia: Secondary | ICD-10-CM | POA: Insufficient documentation

## 2015-03-09 DIAGNOSIS — Z8719 Personal history of other diseases of the digestive system: Secondary | ICD-10-CM | POA: Insufficient documentation

## 2015-03-09 DIAGNOSIS — G43809 Other migraine, not intractable, without status migrainosus: Secondary | ICD-10-CM | POA: Diagnosis not present

## 2015-03-09 DIAGNOSIS — E7800 Pure hypercholesterolemia, unspecified: Secondary | ICD-10-CM | POA: Insufficient documentation

## 2015-03-09 DIAGNOSIS — G43909 Migraine, unspecified, not intractable, without status migrainosus: Secondary | ICD-10-CM | POA: Diagnosis present

## 2015-03-09 DIAGNOSIS — R200 Anesthesia of skin: Secondary | ICD-10-CM | POA: Diagnosis not present

## 2015-03-09 DIAGNOSIS — F319 Bipolar disorder, unspecified: Secondary | ICD-10-CM | POA: Diagnosis not present

## 2015-03-09 DIAGNOSIS — F410 Panic disorder [episodic paroxysmal anxiety] without agoraphobia: Secondary | ICD-10-CM | POA: Diagnosis not present

## 2015-03-09 DIAGNOSIS — E079 Disorder of thyroid, unspecified: Secondary | ICD-10-CM | POA: Insufficient documentation

## 2015-03-09 DIAGNOSIS — Z7982 Long term (current) use of aspirin: Secondary | ICD-10-CM | POA: Diagnosis not present

## 2015-03-09 MED ORDER — KETOROLAC TROMETHAMINE 30 MG/ML IJ SOLN
30.0000 mg | Freq: Once | INTRAMUSCULAR | Status: AC
  Administered 2015-03-09: 30 mg via INTRAVENOUS
  Filled 2015-03-09: qty 1

## 2015-03-09 MED ORDER — SODIUM CHLORIDE 0.9 % IV BOLUS (SEPSIS)
1000.0000 mL | Freq: Once | INTRAVENOUS | Status: AC
  Administered 2015-03-09: 1000 mL via INTRAVENOUS

## 2015-03-09 MED ORDER — LORAZEPAM 2 MG/ML IJ SOLN
1.0000 mg | Freq: Once | INTRAMUSCULAR | Status: AC
  Administered 2015-03-09: 1 mg via INTRAVENOUS
  Filled 2015-03-09: qty 1

## 2015-03-09 MED ORDER — PROCHLORPERAZINE EDISYLATE 5 MG/ML IJ SOLN
10.0000 mg | Freq: Once | INTRAMUSCULAR | Status: AC
  Administered 2015-03-09: 10 mg via INTRAVENOUS
  Filled 2015-03-09: qty 2

## 2015-03-09 MED ORDER — KNEE BRACE/HINGED/REGULAR MISC: 1.0000 | Freq: Every day | Status: DC

## 2015-03-09 MED ORDER — DIPHENHYDRAMINE HCL 50 MG/ML IJ SOLN
25.0000 mg | Freq: Once | INTRAMUSCULAR | Status: AC
  Administered 2015-03-09: 25 mg via INTRAVENOUS
  Filled 2015-03-09: qty 1

## 2015-03-09 NOTE — Telephone Encounter (Signed)
Write a new one

## 2015-03-09 NOTE — Discharge Instructions (Signed)
Headache:  You are having a headache. No specific cause was found today for your headache. It may have been a migraine or other cause of headache. Stress, anxiety, fatigue, and depression are common triggers for headaches. Your headache today does not appear to be life-threatening or require hospitalization, but often the exact cause of headaches is not determined in the emergency department. Therefore, followup with your doctor is very important to find out what may have caused your headache, and whether or not you need any further diagnostic testing or treatment. Sometimes headaches can appear benign but then more serious symptoms can develop which should prompt an immediate reevaluation by your doctor or the emergency department.  Seek immediate medical attention if:   You develop possible problems with medications prescribed.  The medications don't resolve your headache, if it recurs, or if you have multiple episodes of vomiting or can't take fluids by mouth  You have a change from the usual headache.  If you developed a sudden severe headache or confusion, become poorly responsive or faint, developed a fever above 100.4 or problems breathing, have a change in speech, vision, swallowing or understanding, or developed new weakness, numbness, tingling, incoordination or have a seizure.  Please obtain all of your results from medical records or have your doctors office obtain the results - share them with your doctor - you should be seen at your doctors office in the next 2 days. Call today to arrange your follow up. Take the medications as prescribed. Please review all of the medicines and only take them if you do not have an allergy to them. Please be aware that if you are taking birth control pills, taking other prescriptions, ESPECIALLY ANTIBIOTICS may make the birth control ineffective - if this is the case, either do not engage in sexual activity or use alternative methods of birth control such  as condoms until you have finished the medicine and your family doctor says it is OK to restart them. If you are on a blood thinner such as COUMADIN, be aware that any other medicine that you take may cause the coumadin to either work too much, or not enough - you should have your coumadin level rechecked in next 7 days if this is the case.  ?  It is also a possibility that you have an allergic reaction to any of the medicines that you have been prescribed - Everybody reacts differently to medications and while MOST people have no trouble with most medicines, you may have a reaction such as nausea, vomiting, rash, swelling, shortness of breath. If this is the case, please stop taking the medicine immediately and contact your physician.  ?  You should return to the ER if you develop severe or worsening symptoms.
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
## 2015-03-09 NOTE — ED Notes (Addendum)
Pt c/o "severe panic attack" and "migraine" that started at 0800 this morning. Pt took 2 Imitrex this morning without relief. Pt tearful upon assessment. Pt reports light and sound sensitivity. Pt continues to state, "I feel like I'm going to pass out".  Pt reports she had a migraine yesterday that lasted all day and then woke up again this morning with one. Pt attempted to call PCP to see her this morning, but pt was unable to speak with anyone.

## 2015-03-09 NOTE — ED Notes (Signed)
MD at bedside. 

## 2015-03-10 NOTE — Telephone Encounter (Signed)
Reached patient; aware prescription available for brace.

## 2015-03-10 NOTE — Telephone Encounter (Signed)
Prescription available, called patient, no answer 

## 2015-03-24 ENCOUNTER — Other Ambulatory Visit: Payer: Self-pay | Admitting: *Deleted

## 2015-03-24 ENCOUNTER — Telehealth: Payer: Self-pay | Admitting: Orthopedic Surgery

## 2015-03-24 ENCOUNTER — Other Ambulatory Visit: Payer: Self-pay | Admitting: Orthopedic Surgery

## 2015-03-24 ENCOUNTER — Encounter: Payer: Self-pay | Admitting: Orthopedic Surgery

## 2015-03-24 DIAGNOSIS — M48061 Spinal stenosis, lumbar region without neurogenic claudication: Secondary | ICD-10-CM

## 2015-03-24 DIAGNOSIS — M170 Bilateral primary osteoarthritis of knee: Secondary | ICD-10-CM

## 2015-03-24 MED ORDER — HYDROCODONE-ACETAMINOPHEN 5-325 MG PO TABS
1.0000 | ORAL_TABLET | Freq: Three times a day (TID) | ORAL | Status: DC | PRN
Start: 2015-03-24 — End: 2015-10-21

## 2015-03-24 MED ORDER — HYDROCODONE-ACETAMINOPHEN 5-325 MG PO TABS: 1.0000 | ORAL_TABLET | Freq: Four times a day (QID) | ORAL | Status: DC | PRN

## 2015-03-24 NOTE — Telephone Encounter (Signed)
Prescription available, patient aware  

## 2015-03-24 NOTE — Telephone Encounter (Signed)
Patient called to request refill on: HYDROcodone-acetaminophen (NORCO/VICODIN) 5-325 MG tablet [id number]  - home ph# is [phone]

## 2015-03-25 NOTE — Telephone Encounter (Signed)
Patient picked up prescription 03/24/15 and received it with letter.

## 2015-04-13 ENCOUNTER — Telehealth: Payer: Self-pay | Admitting: *Deleted

## 2015-04-13 ENCOUNTER — Other Ambulatory Visit: Payer: Self-pay | Admitting: *Deleted

## 2015-04-13 MED ORDER — METHOCARBAMOL 500 MG PO TABS: 500.0000 mg | ORAL_TABLET | Freq: Four times a day (QID) | ORAL | Status: DC

## 2015-04-13 NOTE — Telephone Encounter (Signed)
SCOTT FROM [facility] CALLED TO ADVISE DR HARRISON THAT PATIENT HAS BEEN RECEIVING OXYCODONE 10MG  #90 MONTHLY SINCE October 2016 FROM DR Joya Salm, IT DID NOT FLAG IN THEIR SYSTEM BEFORE TODAY

## 2015-04-14 NOTE — Telephone Encounter (Signed)
Send her a letter no more meds from [place]

## 2015-04-14 NOTE — Telephone Encounter (Signed)
Can you put a letter together stating this?

## 2015-04-15 ENCOUNTER — Encounter: Payer: Self-pay | Admitting: Orthopedic Surgery

## 2015-04-15 NOTE — Telephone Encounter (Signed)
Letter issued

## 2015-05-21 ENCOUNTER — Ambulatory Visit: Payer: Medicaid Other | Admitting: Orthopedic Surgery

## 2015-05-25 ENCOUNTER — Encounter: Payer: Self-pay | Admitting: Orthopedic Surgery

## 2015-05-25 ENCOUNTER — Ambulatory Visit (INDEPENDENT_AMBULATORY_CARE_PROVIDER_SITE_OTHER): Payer: Medicaid Other | Admitting: Orthopedic Surgery

## 2015-05-25 VITALS — BP 136/77 | Ht 66.0 in | Wt 219.0 lb

## 2015-05-25 DIAGNOSIS — M25562 Pain in left knee: Secondary | ICD-10-CM

## 2015-05-25 DIAGNOSIS — M25561 Pain in right knee: Secondary | ICD-10-CM

## 2015-05-25 DIAGNOSIS — M170 Bilateral primary osteoarthritis of knee: Secondary | ICD-10-CM

## 2015-05-25 NOTE — Progress Notes (Signed)
Chief Complaint  Patient presents with  . Follow-up    FOLLOW UP BILATERAL KNEES, REPEAT INJECTIONS     Procedure note left knee injection verbal consent was obtained to inject left knee joint  Timeout was completed to confirm the site of injection  The medications used were 40 mg of Depo-Medrol and 1% lidocaine 3 cc  Anesthesia was provided by ethyl chloride and the skin was prepped with alcohol.  After cleaning the skin with alcohol a 20-gauge needle was used to inject the left knee joint. There were no complications. A sterile bandage was applied.   Procedure note right knee injection verbal consent was obtained to inject right knee joint  Timeout was completed to confirm the site of injection  The medications used were 40 mg of Depo-Medrol and 1% lidocaine 3 cc  Anesthesia was provided by ethyl chloride and the skin was prepped with alcohol.  After cleaning the skin with alcohol a 20-gauge needle was used to inject the right knee joint. There were no complications. A sterile bandage was applied.

## 2015-07-10 ENCOUNTER — Other Ambulatory Visit (HOSPITAL_COMMUNITY): Payer: Self-pay | Admitting: Internal Medicine

## 2015-07-10 DIAGNOSIS — Z1231 Encounter for screening mammogram for malignant neoplasm of breast: Secondary | ICD-10-CM

## 2015-08-19 ENCOUNTER — Ambulatory Visit (HOSPITAL_COMMUNITY)
Admission: RE | Admit: 2015-08-19 | Discharge: 2015-08-19 | Disposition: A | Discharge status: Home / Self Care | Payer: Medicaid Other | Source: Ambulatory Visit | Attending: Internal Medicine | Admitting: Internal Medicine

## 2015-08-19 ENCOUNTER — Ambulatory Visit (HOSPITAL_COMMUNITY): Payer: Self-pay

## 2015-08-19 ENCOUNTER — Other Ambulatory Visit (HOSPITAL_COMMUNITY): Payer: Self-pay | Admitting: Internal Medicine

## 2015-08-19 DIAGNOSIS — Z1231 Encounter for screening mammogram for malignant neoplasm of breast: Secondary | ICD-10-CM

## 2015-08-25 ENCOUNTER — Ambulatory Visit: Payer: Medicaid Other | Admitting: Orthopedic Surgery

## 2015-09-01 ENCOUNTER — Ambulatory Visit (INDEPENDENT_AMBULATORY_CARE_PROVIDER_SITE_OTHER): Payer: Medicaid Other | Admitting: Orthopedic Surgery

## 2015-09-01 VITALS — BP 101/67 | HR 80 | Ht 66.0 in | Wt 217.8 lb

## 2015-09-01 DIAGNOSIS — M13161 Monoarthritis, not elsewhere classified, right knee: Secondary | ICD-10-CM

## 2015-09-01 MED ORDER — PREDNISONE 10 MG PO TABS
10.0000 mg | ORAL_TABLET | Freq: Every day | ORAL | Status: DC
Start: 2015-09-01 — End: 2015-10-29

## 2015-09-01 NOTE — Patient Instructions (Signed)

## 2015-09-01 NOTE — Progress Notes (Signed)
  Procedure note left knee injection verbal consent was obtained to inject left knee joint  Timeout was completed to confirm the site of injection  The medications used were 40 mg of Depo-Medrol and 1% lidocaine 3 cc  Anesthesia was provided by ethyl chloride and the skin was prepped with alcohol.  After cleaning the skin with alcohol a 20-gauge needle was used to inject the left knee joint. There were no complications. A sterile bandage was applied.   Procedure note right knee injection verbal consent was obtained to inject right knee joint  Timeout was completed to confirm the site of injection  The medications used were 40 mg of Depo-Medrol and 1% lidocaine 3 cc  Anesthesia was provided by ethyl chloride and the skin was prepped with alcohol.  After cleaning the skin with alcohol a 20-gauge needle was used to inject the right knee joint. There were no complications. A sterile bandage was applied.  She is complaining of increasing pain in both knees and catching in the right knee  Next visit x-ray right knee and possible MRI right knee  Start 10 mg prednisone daily for 10 days and then hold ibuprofen during this time

## 2015-09-16 ENCOUNTER — Ambulatory Visit (INDEPENDENT_AMBULATORY_CARE_PROVIDER_SITE_OTHER): Payer: Medicaid Other | Admitting: Orthopedic Surgery

## 2015-09-16 ENCOUNTER — Encounter: Payer: Self-pay | Admitting: Orthopedic Surgery

## 2015-09-16 VITALS — BP 126/74 | Ht 66.0 in | Wt 213.0 lb

## 2015-09-16 DIAGNOSIS — M25561 Pain in right knee: Secondary | ICD-10-CM

## 2015-09-16 DIAGNOSIS — M4806 Spinal stenosis, lumbar region: Secondary | ICD-10-CM | POA: Diagnosis not present

## 2015-09-16 DIAGNOSIS — M23321 Other meniscus derangements, posterior horn of medial meniscus, right knee: Secondary | ICD-10-CM

## 2015-09-16 DIAGNOSIS — M48061 Spinal stenosis, lumbar region without neurogenic claudication: Secondary | ICD-10-CM

## 2015-09-16 DIAGNOSIS — Z9889 Other specified postprocedural states: Secondary | ICD-10-CM

## 2015-09-16 NOTE — Patient Instructions (Signed)
  We will obtain pre-certification from the insurer and call you to schedule the study. Dr Aline Brochure will SEE YOU AFTER  the results

## 2015-09-16 NOTE — Progress Notes (Signed)
Patient ID: [name], female   DOB: 07-Feb-1961, 55 y.o.   MRN: [id number]  Chief Complaint  Patient presents with  . Follow-up    both knees locking up    HPI  68 rolled female with chronic pain and long-standing osteoarthritis of both knees as well as lumbar disc disease presents for evaluation of  Locking of the right knee. Prior treatment has included oral analgesics prednisone multiple cortisone injections.  Complains of increasing sensation of locking of the right knee.  "iT SEEMS LIKE IT WANT TO GO BACKWARD AND NOE THE LEFT KNEE IS DOING IT TOO   SEVERE PAIN TODAY   Review of Systems  Constitutional: Negative for fever and chills.  Musculoskeletal: Positive for back pain and joint pain.    BP 126/74 mmHg  Ht 5\' 6"  (1.676 m)  Wt 213 lb (96.616 kg)  BMI 34.40 kg/m2 Gen. appearance is normal grooming and hygiene Orientation to person place and time normal Mood normal Gait is abnormal requiring the use of a cane  Mild peripheral edema or swelling is noted in the right and left ankle Sensory exam shows normal sensation to palpation, pressure and soft touch Skin exam no lacerations ulcerations or erythema  Right Knee Exam   Tenderness  The patient is experiencing tenderness in the lateral joint line and patella.  Range of Motion  Extension: normal  Flexion: normal   Muscle Strength   The patient has normal right knee strength.  Tests  McMurray:  Medial - positive  Drawer:       Anterior - negative    Posterior - negative Varus: negative    Other  Erythema: absent Sensation: normal Pulse: present Swelling: none Other tests: no effusion present       A/P  Medical decision-making  Known osteoarthritis right knee with impression of locking  Recommend MRI right knee to determine if she has a torn meniscus or if it's just arthritis and pseudo-locking from chronic lumbar disc problem    Arther Abbott, MD 09/16/2015 2:31 PM

## 2015-09-25 ENCOUNTER — Encounter (HOSPITAL_COMMUNITY): Payer: Self-pay | Admitting: Emergency Medicine

## 2015-09-25 ENCOUNTER — Emergency Department (HOSPITAL_COMMUNITY)
Admission: EM | Admit: 2015-09-25 | Discharge: 2015-09-25 | Disposition: A | Discharge status: Home / Self Care | Payer: Medicaid Other | Attending: Emergency Medicine | Admitting: Emergency Medicine

## 2015-09-25 ENCOUNTER — Emergency Department (HOSPITAL_COMMUNITY): Payer: Medicaid Other

## 2015-09-25 DIAGNOSIS — Z794 Long term (current) use of insulin: Secondary | ICD-10-CM | POA: Insufficient documentation

## 2015-09-25 DIAGNOSIS — Z7984 Long term (current) use of oral hypoglycemic drugs: Secondary | ICD-10-CM | POA: Insufficient documentation

## 2015-09-25 DIAGNOSIS — E119 Type 2 diabetes mellitus without complications: Secondary | ICD-10-CM | POA: Diagnosis not present

## 2015-09-25 DIAGNOSIS — F410 Panic disorder [episodic paroxysmal anxiety] without agoraphobia: Secondary | ICD-10-CM | POA: Diagnosis present

## 2015-09-25 DIAGNOSIS — R51 Headache: Secondary | ICD-10-CM | POA: Diagnosis not present

## 2015-09-25 DIAGNOSIS — Z79899 Other long term (current) drug therapy: Secondary | ICD-10-CM | POA: Diagnosis not present

## 2015-09-25 DIAGNOSIS — Z7982 Long term (current) use of aspirin: Secondary | ICD-10-CM | POA: Diagnosis not present

## 2015-09-25 DIAGNOSIS — Z87891 Personal history of nicotine dependence: Secondary | ICD-10-CM | POA: Diagnosis not present

## 2015-09-25 DIAGNOSIS — F319 Bipolar disorder, unspecified: Secondary | ICD-10-CM | POA: Diagnosis not present

## 2015-09-25 DIAGNOSIS — F419 Anxiety disorder, unspecified: Secondary | ICD-10-CM | POA: Insufficient documentation

## 2015-09-25 LAB — CBC WITH DIFFERENTIAL/PLATELET
Basophils Absolute: 0 10*3/uL (ref 0.0–0.1)
Basophils Relative: 0 %
Eosinophils Absolute: 0.2 10*3/uL (ref 0.0–0.7)
Eosinophils Relative: 2 %
HCT: 39.8 % (ref 36.0–46.0)
Hemoglobin: 13.7 g/dL (ref 12.0–15.0)
Lymphocytes Relative: 44 %
Lymphs Abs: 3.1 10*3/uL (ref 0.7–4.0)
MCH: 31.9 pg (ref 26.0–34.0)
MCHC: 34.4 g/dL (ref 30.0–36.0)
MCV: 92.6 fL (ref 78.0–100.0)
Monocytes Absolute: 0.4 10*3/uL (ref 0.1–1.0)
Monocytes Relative: 6 %
Neutro Abs: 3.3 10*3/uL (ref 1.7–7.7)
Neutrophils Relative %: 48 %
Platelets: 235 10*3/uL (ref 150–400)
RBC: 4.3 MIL/uL (ref 3.87–5.11)
RDW: 12.5 % (ref 11.5–15.5)
WBC: 7 10*3/uL (ref 4.0–10.5)

## 2015-09-25 LAB — BASIC METABOLIC PANEL
Anion gap: 10 (ref 5–15)
BUN: 24 mg/dL — ABNORMAL HIGH (ref 6–20)
CO2: 20 mmol/L — ABNORMAL LOW (ref 22–32)
Calcium: 9.2 mg/dL (ref 8.9–10.3)
Chloride: 104 mmol/L (ref 101–111)
Creatinine, Ser: 0.97 mg/dL (ref 0.44–1.00)
GFR calc Af Amer: >60 mL/min (ref >60)
GFR calc non Af Amer: >60 mL/min (ref >60)
Glucose, Bld: 267 mg/dL — ABNORMAL HIGH (ref 65–99)
Potassium: 3.6 mmol/L (ref 3.5–5.1)
Sodium: 134 mmol/L — ABNORMAL LOW (ref 135–145)

## 2015-09-25 LAB — TROPONIN I
Troponin I: <0.03 ng/mL (ref <0.03)
Troponin I: <0.03 ng/mL (ref <0.03)

## 2015-09-25 MED ORDER — ACETAMINOPHEN 325 MG PO TABS
650.0000 mg | ORAL_TABLET | Freq: Once | ORAL | Status: AC
  Administered 2015-09-25: 650 mg via ORAL
  Filled 2015-09-25: qty 2

## 2015-09-25 MED ORDER — METOCLOPRAMIDE HCL 5 MG/ML IJ SOLN
10.0000 mg | Freq: Once | INTRAMUSCULAR | Status: AC
  Administered 2015-09-25: 10 mg via INTRAVENOUS
  Filled 2015-09-25: qty 2

## 2015-09-25 MED ORDER — LORAZEPAM 1 MG PO TABS
1.0000 mg | ORAL_TABLET | Freq: Once | ORAL | Status: AC
Start: 2015-09-25 — End: 2015-09-25
  Administered 2015-09-25: 1 mg via ORAL
  Filled 2015-09-25: qty 1

## 2015-09-25 MED ORDER — KETOROLAC TROMETHAMINE 30 MG/ML IJ SOLN
30.0000 mg | Freq: Once | INTRAMUSCULAR | Status: AC
  Administered 2015-09-25: 30 mg via INTRAVENOUS
  Filled 2015-09-25: qty 1
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
## 2015-09-25 NOTE — Discharge Instructions (Signed)
Panic Attacks °Panic attacks are sudden, short feelings of great fear or discomfort. You may have them for no reason when you are relaxed, when you are uneasy (anxious), or when you are sleeping.  °HOME CARE °· Take all your medicines as told. °· Check with your doctor before starting new medicines. °· Keep all doctor visits. °GET HELP IF: °· You are not able to take your medicines as told. °· Your symptoms do not get better. °· Your symptoms get worse. °GET HELP RIGHT AWAY IF: °· Your attacks seem different than your normal attacks. °· You have thoughts about hurting yourself or others. °· You take panic attack medicine and you have a side effect. °MAKE SURE YOU: °· Understand these instructions. °· Will watch your condition. °· Will get help right away if you are not doing well or get worse. °  °This information is not intended to replace advice given to you by your health care provider. Make sure you discuss any questions you have with your health care provider. °  °Document Released: 04/09/2010 Document Revised: 12/26/2012 Document Reviewed: 10/19/2012 °Elsevier Interactive Patient Education ©2016 [employer] ° °
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
## 2015-10-02 ENCOUNTER — Ambulatory Visit (HOSPITAL_COMMUNITY): Admission: RE | Admit: 2015-10-02 | Payer: Medicaid Other | Source: Ambulatory Visit

## 2015-10-05 ENCOUNTER — Telehealth: Payer: Self-pay | Admitting: Orthopedic Surgery

## 2015-10-05 NOTE — Telephone Encounter (Signed)
Patient called to relay that she was unable to have the MRI as scheduled at [facility] on 10/02/15 - states due to anxiety.  She is requesting to have open MRI in [place].  Relayed to patient that insurance may require a new pre-authorization. Please advise regarding MRI and also of her follow up appointment which was made for 10/15/15.

## 2015-10-06 ENCOUNTER — Other Ambulatory Visit: Payer: Self-pay | Admitting: *Deleted

## 2015-10-06 DIAGNOSIS — M23321 Other meniscus derangements, posterior horn of medial meniscus, right knee: Secondary | ICD-10-CM

## 2015-10-06 NOTE — Telephone Encounter (Signed)
Called [place] [employer] and got the location changed to [place]. I notified the patient and advised her to be expecting a a call from them to schedule, and keep follow up appointment unless she doesn't get an appointment with them, if so then call us back to reschedule.

## 2015-10-07 ENCOUNTER — Other Ambulatory Visit: Payer: Self-pay | Admitting: *Deleted

## 2015-10-07 MED ORDER — METHOCARBAMOL 500 MG PO TABS: 500.0000 mg | ORAL_TABLET | Freq: Four times a day (QID) | ORAL | Status: DC

## 2015-10-14 ENCOUNTER — Ambulatory Visit
Admission: RE | Admit: 2015-10-14 | Discharge: 2015-10-14 | Disposition: A | Discharge status: Home / Self Care | Payer: Medicaid Other | Source: Ambulatory Visit | Attending: Orthopedic Surgery | Admitting: Orthopedic Surgery

## 2015-10-14 DIAGNOSIS — M23321 Other meniscus derangements, posterior horn of medial meniscus, right knee: Secondary | ICD-10-CM

## 2015-10-15 ENCOUNTER — Ambulatory Visit: Payer: Self-pay | Admitting: Orthopedic Surgery

## 2015-10-21 ENCOUNTER — Encounter (HOSPITAL_COMMUNITY): Payer: Self-pay | Admitting: Emergency Medicine

## 2015-10-21 ENCOUNTER — Emergency Department (HOSPITAL_COMMUNITY)
Admission: EM | Admit: 2015-10-21 | Discharge: 2015-10-21 | Disposition: A | Discharge status: Home / Self Care | Payer: Medicaid Other | Attending: Emergency Medicine | Admitting: Emergency Medicine

## 2015-10-21 ENCOUNTER — Emergency Department (HOSPITAL_COMMUNITY): Payer: Medicaid Other

## 2015-10-21 DIAGNOSIS — E119 Type 2 diabetes mellitus without complications: Secondary | ICD-10-CM | POA: Diagnosis not present

## 2015-10-21 DIAGNOSIS — M1812 Unilateral primary osteoarthritis of first carpometacarpal joint, left hand: Secondary | ICD-10-CM

## 2015-10-21 DIAGNOSIS — Z7984 Long term (current) use of oral hypoglycemic drugs: Secondary | ICD-10-CM | POA: Diagnosis not present

## 2015-10-21 DIAGNOSIS — Z7982 Long term (current) use of aspirin: Secondary | ICD-10-CM | POA: Insufficient documentation

## 2015-10-21 DIAGNOSIS — Z79899 Other long term (current) drug therapy: Secondary | ICD-10-CM | POA: Insufficient documentation

## 2015-10-21 DIAGNOSIS — M19042 Primary osteoarthritis, left hand: Secondary | ICD-10-CM | POA: Insufficient documentation

## 2015-10-21 DIAGNOSIS — Z794 Long term (current) use of insulin: Secondary | ICD-10-CM | POA: Diagnosis not present

## 2015-10-21 DIAGNOSIS — M79645 Pain in left finger(s): Secondary | ICD-10-CM

## 2015-10-21 DIAGNOSIS — Z87891 Personal history of nicotine dependence: Secondary | ICD-10-CM | POA: Insufficient documentation

## 2015-10-21 NOTE — ED Triage Notes (Signed)
Pt reports left thumb and index finger pain radiating to left wrist. Pt denies any known injury. No deformity noted.

## 2015-10-21 NOTE — ED Provider Notes (Signed)
[facility] DEPT Provider Note   CSN: [id number] Arrival date & time: 10/21/15  1628  First Provider Contact:  None       History   Chief Complaint Chief Complaint  Patient presents with  . Finger Injury    HPI [name] is a 55 y.o. female.  HPI   Pt is a 55 year old female with a history of DM, depression, anxiety, nausea, OA who witnessed the ED with progressively worsening left thumb pain for 2 weeks. Pain is 5/10 at rest, achy, worse with movement, 7/10, radiates into the left pointer finger. Patient is tried 800 mg ibuprofen and 1600 mg of gabapentin without relief. No other associated symptoms. Patient denies fevers, chills, nausea, vomiting, joint swelling, joint redness, trauma or injury to the area of her thumb.  Past Medical History:  Diagnosis Date  . Anxiety   . Bipolar disorder ([place])   . Depression   . Diabetes mellitus   . Elevated cholesterol   . Fibromyalgia   . GERD (gastroesophageal reflux disease)   . Migraine   . PTSD (post-traumatic stress disorder)    after death of son (accidental overdose)  . Thyroid disease     Patient Active Problem List   Diagnosis Date Noted  . Post traumatic stress disorder (PTSD) 11/08/2014  . MDD (major depressive disorder), recurrent severe, without psychosis ([place]) 11/08/2014  . Substance-induced psychotic disorder with hallucinations ([place]) 11/08/2014  . Panic disorder 11/08/2014  . Chronic pain syndrome 11/21/2013  . Primary osteoarthritis of both knees 11/21/2013  . Spinal stenosis of lumbar region 11/21/2013  . Difficulty in walking(719.7) 10/14/2013  . Sciatica associated with disorder of lumbar spine 09/17/2013  . Lower back pain 08/08/2013  . Lateral meniscus, posterior horn derangement 06/13/2013  . Arthritis of right knee 06/13/2013  . S/P medial meniscectomy of right knee 06/13/2013  . Left shoulder pain 08/28/2012  . Rotator cuff syndrome of left shoulder 08/28/2012  . Rotator cuff tear  08/28/2012  . Pes anserinus bursitis 05/16/2012  . Encounter for screening colonoscopy 04/16/2012  . Fatty liver 04/16/2012  . Back pain 07/26/2011  . Knee bursitis, left 07/26/2011  . OA (osteoarthritis) of knee 07/26/2011  . Spinal stenosis 10/28/2010  . Medial meniscus, posterior horn derangement 08/12/2010  . Knee pain 08/04/2010  . Sciatica 08/04/2010  . H N P-LUMBAR 01/14/2010  . 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 LUMBOSACRAL STRAIN 05/12/2009  . DERANGEMENT OF POSTERIOR HORN OF MEDIAL MENISCUS 01/19/2009  . BACK PAIN 01/19/2009  . LOWER LEG, ARTHRITIS, DEGEN./OSTEO 12/02/2008  . JOINT EFFUSION, KNEE 11/10/2008  . ANSERINE BURSITIS, RIGHT 11/10/2008  . DERANGEMENT MENISCUS 10/15/2008  . KNEE PAIN 10/15/2008    Past Surgical History:  Procedure Laterality Date  . CARPAL TUNNEL RELEASE  bilateral  . CHOLECYSTECTOMY    . COLONOSCOPY WITH PROPOFOL N/A 05/01/2012   [id number] COLON polyps/Mild diverticulosis was noted in the sigmoid colon/Moderate sized internal hemorrhoids. path with tubular adenoma  . gallbladder    . KNEE ARTHROSCOPY     bilateral  . POLYPECTOMY N/A 05/01/2012   Procedure: POLYPECTOMY;  Surgeon: Danie Binder, MD;  Location: [facility];  Service: Endoscopy;  Laterality: N/A;  . SHOULDER SURGERY  left  . VAGINAL HYSTERECTOMY      OB History    No data available       Home Medications    Prior to Admission medications   Medication Sig Start Date End Date Taking? Authorizing Provider  ARIPiprazole (ABILIFY) 20 MG tablet  Take 20 mg by mouth daily.   Yes Historical Provider, MD  aspirin EC 81 MG tablet Take 1 tablet (81 mg total) by mouth daily. 11/10/14  Yes Niel Hummer, NP  canagliflozin (INVOKANA) 300 MG TABS tablet Take 300 mg by mouth daily. 11/10/14  Yes Niel Hummer, NP  clonazePAM (KLONOPIN) 0.5 MG tablet Take 0.5 mg by mouth 3 (three) times daily.   Yes Historical Provider, MD  exenatide (BYETTA) 10 MCG/0.04ML SOPN injection Inject 0.04 mLs  (10 mcg total) into the skin 2 (two) times daily with a meal. Pt takes 10 mcg twice daily 11/10/14  Yes Niel Hummer, NP  FLUoxetine (PROZAC) 20 MG capsule Take 3 capsules (60 mg total) by mouth every morning. 11/10/14  Yes Niel Hummer, NP  gabapentin (NEURONTIN) 800 MG tablet Take 800 mg by mouth 4 (four) times daily.    Yes Historical Provider, MD  glipiZIDE (GLUCOTROL XL) 5 MG 24 hr tablet Take 5 mg by mouth daily with breakfast.   Yes Historical Provider, MD  insulin glargine (LANTUS) 100 UNIT/ML injection Inject 55 Units into the skin at bedtime.    Yes Historical Provider, MD  levothyroxine (SYNTHROID, LEVOTHROID) 150 MCG tablet Take 150 mcg by mouth daily before breakfast.   Yes Historical Provider, MD  metFORMIN (GLUCOPHAGE) 500 MG tablet Take 2 tablets (1,000 mg total) by mouth 2 (two) times daily. 11/10/14  Yes Niel Hummer, NP  methocarbamol (ROBAXIN) 500 MG tablet Take 1 tablet (500 mg total) by mouth 4 (four) times daily. 10/07/15  Yes Carole Civil, MD  Oxycodone HCl 10 MG TABS Take 10 mg by mouth 3 (three) times daily.   Yes Historical Provider, MD  simvastatin (ZOCOR) 40 MG tablet Take 1 tablet (40 mg total) by mouth daily. 11/10/14  Yes Niel Hummer, NP  SUMAtriptan (IMITREX) 100 MG tablet One tablet po prn migraine.  May repeat in 2 hrs if needed.  Do not exceed 200 mg per 24 hrs. Patient taking differently: Take 100 mg by mouth every 2 (two) hours as needed for migraine or headache. May repeat in 2 hrs if needed.  Do not exceed 200 mg per 24 hrs. 06/26/12  Yes Tammy Triplett, PA-C  topiramate (TOPAMAX) 100 MG tablet Take 1 tablet (100 mg total) by mouth at bedtime. 11/10/14  Yes Niel Hummer, NP  traZODone (DESYREL) 150 MG tablet Take 1 tablet (150 mg total) by mouth at bedtime. Patient taking differently: Take 200 mg by mouth at bedtime.  11/10/14  Yes Niel Hummer, NP  Elastic Bandages & Supports (KNEE BRACE/HINGED/REGULAR) MISC 1 each by Does not apply route daily. Apply knee  brace daily to left knee 03/09/15   Carole Civil, MD  predniSONE (DELTASONE) 10 MG tablet Take 1 tablet (10 mg total) by mouth daily with breakfast. Patient not taking: Reported on 10/21/2015 09/01/15   Carole Civil, MD    Family History Family History  Problem Relation Age of Onset  . Cancer    . Diabetes    . Heart disease    . Arthritis    . Stroke Mother   . Anxiety disorder Mother   . Cancer - Lung Father   . Lung disease    . Colon cancer Neg Hx     Social History Social History  Substance Use Topics  . Smoking status: Former Smoker    Packs/day: 0.50    Years: 3.00  . Smokeless tobacco: Never Used  .  Alcohol use No     Allergies   Review of patient's allergies indicates no known allergies.   Review of Systems Review of Systems  Constitutional: Negative for chills and fever.  Gastrointestinal: Negative for nausea and vomiting.  Musculoskeletal: Positive for arthralgias. Negative for joint swelling.  Skin: Negative for wound.     Physical Exam Updated Vital Signs BP 111/64 (BP Location: Left Arm)   Pulse 91   Temp 98.1 F (36.7 C) (Oral)   Resp 16   Ht 5\' 6"  (1.676 m)   Wt 100.7 kg   SpO2 97%   BMI 35.83 kg/m   Physical Exam  Constitutional: She appears well-developed and well-nourished. No distress.  HENT:  Head: Normocephalic and atraumatic.  Eyes: Conjunctivae are normal.  Cardiovascular: Normal rate, regular rhythm and normal heart sounds.   Pulses:      Radial pulses are 2+ on the right side, and 2+ on the left side.  Pulmonary/Chest: Effort normal. No respiratory distress.  Musculoskeletal:  Examination of the left thumb revealed no deformity, ecchymosis, edema, redness or warmth, brisk cap refill bilaterally, poor range of motion due to pain, TTP over the MCP and interphalangeal joint of the thumb, sensation intact, patient neurovascularly intact distally  Neurological: She is alert. Coordination normal.  Skin: Skin is warm and  dry. She is not diaphoretic.  Psychiatric: She has a normal mood and affect. Her behavior is normal.  Nursing note and vitals reviewed.    ED Treatments / Results  Labs (all labs ordered are listed, but only abnormal results are displayed) Labs Reviewed - No data to display  EKG  EKG Interpretation None       Radiology Dg Hand Complete Left  Result Date: 10/21/2015 CLINICAL DATA:  55 year old female with left hand pain, especially at the thumb radiating to the radial aspect of the wrist. No known injury. Initial encounter. EXAM: LEFT HAND - COMPLETE 3+ VIEW COMPARISON:  None. FINDINGS: Bone mineralization is within normal limits. Distal radius and ulna intact. Carpal bone alignment within normal limits. Carpal bone joint spaces preserved. Metacarpals appear intact and within normal limits. There is mild joint space loss and subchondral sclerosis at the thumb MCP joint. Other MCP joints are normal. Mild diffuse distal joint space loss and osteophytosis. Phalanges intact. No periarticular erosions. IMPRESSION: Osteoarthritis involving the thumb metacarpal and distal joint spaces. No acute osseous abnormality identified. Electronically Signed   By: Genevie Ann M.D.   On: 10/21/2015 17:37    Procedures Procedures (including critical care time)  Medications Ordered in ED Medications - No data to display   Initial Impression / Assessment and Plan / ED Course  I have reviewed the triage vital signs and the nursing notes.  Pertinent labs & imaging results that were available during my care of the patient were reviewed by me and considered in my medical decision making (see chart for details).  Clinical Course   Patient with progressively worsening pain to left thumb. X-rays reviewed by me revealed osteoarthritis without any acute abnormalities. VSS, patient stable, no systemic symptoms, no redness, swelling, or increased warmth to the area. Less concerning for septic joint or infectious  process.  Patient is followed by Dr. Aline Brochure, orthopedics for her knee OA. I instructed the patient to follow-up with him in 2-3 days regarding pain in her thumb joint for possible cortisone injection. I instructed the patient to continue to take ibuprofen for this pain and be sure to eat when she takes this  medication.  I discussed strict return precautions. Patient expressed understanding to the discharge instructions.  Final Clinical Impressions(s) / ED Diagnoses   Final diagnoses:  Osteoarthritis of left thumb  Pain of left thumb    New Prescriptions New Prescriptions   No medications on file     Kalman Drape, [place] 10/21/15 1809    Milton Ferguson, MD 10/22/15 1525

## 2015-10-21 NOTE — Discharge Instructions (Signed)
Take ibuprofen as needed for pain. Follow up with Dr. Aline Brochure in 2-3 days to have your thumb reevaluated.   Return to the emergency department if you experience swelling, redness, warmth, increased pain to your thumb, fever, or any other concerning symptoms.

## 2015-10-22 ENCOUNTER — Encounter: Payer: Self-pay | Admitting: Orthopedic Surgery

## 2015-10-22 ENCOUNTER — Ambulatory Visit (INDEPENDENT_AMBULATORY_CARE_PROVIDER_SITE_OTHER): Payer: Medicaid Other | Admitting: Orthopedic Surgery

## 2015-10-22 VITALS — BP 122/61 | HR 90 | Ht 66.0 in | Wt 223.0 lb

## 2015-10-22 DIAGNOSIS — M25561 Pain in right knee: Secondary | ICD-10-CM | POA: Diagnosis not present

## 2015-10-22 DIAGNOSIS — M23321 Other meniscus derangements, posterior horn of medial meniscus, right knee: Secondary | ICD-10-CM

## 2015-10-22 DIAGNOSIS — Z9889 Other specified postprocedural states: Secondary | ICD-10-CM | POA: Diagnosis not present

## 2015-10-22 NOTE — Progress Notes (Signed)
Chief Complaint  Patient presents with  . Follow-up    MRI Results and injections bilateral knees   HPI fissure female came in today for MRI results an injection both knees. She has got knee injections for arthritis for several months if not up to 1-2 years now but she had increased pain swelling locking catching of the right knee we center for MRI shows she has a torn meniscus and a previous partial meniscectomy medial meniscus.  The pain has gotten worse pain is moderate to severe the swelling has gotten worse motion is worsened and the catching locking is also worsened  Review of Systems  Constitutional: Negative for chills and fever.  Respiratory: Negative for wheezing.   Cardiovascular: Negative for chest pain.    Past Medical History:  Diagnosis Date  . Anxiety   . Bipolar disorder ([place])   . Depression   . Diabetes mellitus   . Elevated cholesterol   . Fibromyalgia   . GERD (gastroesophageal reflux disease)   . Migraine   . PTSD (post-traumatic stress disorder)    after death of son (accidental overdose)  . Thyroid disease     Past Surgical History:  Procedure Laterality Date  . CARPAL TUNNEL RELEASE  bilateral  . CHOLECYSTECTOMY    . COLONOSCOPY WITH PROPOFOL N/A 05/01/2012   [id number] COLON polyps/Mild diverticulosis was noted in the sigmoid colon/Moderate sized internal hemorrhoids. path with tubular adenoma  . gallbladder    . KNEE ARTHROSCOPY     bilateral  . POLYPECTOMY N/A 05/01/2012   Procedure: POLYPECTOMY;  Surgeon: Danie Binder, MD;  Location: [facility];  Service: Endoscopy;  Laterality: N/A;  . SHOULDER SURGERY  left  . VAGINAL HYSTERECTOMY     Family History  Problem Relation Age of Onset  . Cancer    . Diabetes    . Heart disease    . Arthritis    . Stroke Mother   . Anxiety disorder Mother   . Cancer - Lung Father   . Lung disease    . Colon cancer Neg Hx    Social History  Substance Use Topics  . Smoking status: Former Smoker    Packs/day:  0.50    Years: 3.00  . Smokeless tobacco: Never Used  . Alcohol use No    Current Outpatient Prescriptions:  .  ARIPiprazole (ABILIFY) 20 MG tablet, Take 20 mg by mouth daily., Disp: , Rfl:  .  aspirin EC 81 MG tablet, Take 1 tablet (81 mg total) by mouth daily., Disp: , Rfl:  .  canagliflozin (INVOKANA) 300 MG TABS tablet, Take 300 mg by mouth daily., Disp: 30 tablet, Rfl:  .  clonazePAM (KLONOPIN) 0.5 MG tablet, Take 0.5 mg by mouth 3 (three) times daily., Disp: , Rfl:  .  Elastic Bandages & Supports (KNEE BRACE/HINGED/REGULAR) MISC, 1 each by Does not apply route daily. Apply knee brace daily to left knee, Disp: 1 each, Rfl: 0 .  exenatide (BYETTA) 10 MCG/0.04ML SOPN injection, Inject 0.04 mLs (10 mcg total) into the skin 2 (two) times daily with a meal. Pt takes 10 mcg twice daily, Disp: 2.4 mL, Rfl:  .  FLUoxetine (PROZAC) 20 MG capsule, Take 3 capsules (60 mg total) by mouth every morning., Disp: 90 capsule, Rfl: 0 .  gabapentin (NEURONTIN) 800 MG tablet, Take 800 mg by mouth 4 (four) times daily. , Disp: , Rfl:  .  glipiZIDE (GLUCOTROL XL) 5 MG 24 hr tablet, Take 5 mg by mouth daily with  breakfast., Disp: , Rfl:  .  insulin glargine (LANTUS) 100 UNIT/ML injection, Inject 55 Units into the skin at bedtime. , Disp: , Rfl:  .  levothyroxine (SYNTHROID, LEVOTHROID) 150 MCG tablet, Take 150 mcg by mouth daily before breakfast., Disp: , Rfl:  .  metFORMIN (GLUCOPHAGE) 500 MG tablet, Take 2 tablets (1,000 mg total) by mouth 2 (two) times daily., Disp: , Rfl:  .  methocarbamol (ROBAXIN) 500 MG tablet, Take 1 tablet (500 mg total) by mouth 4 (four) times daily., Disp: 90 tablet, Rfl: 5 .  Oxycodone HCl 10 MG TABS, Take 10 mg by mouth 3 (three) times daily., Disp: , Rfl:  .  predniSONE (DELTASONE) 10 MG tablet, Take 1 tablet (10 mg total) by mouth daily with breakfast., Disp: 10 tablet, Rfl: 1 .  simvastatin (ZOCOR) 40 MG tablet, Take 1 tablet (40 mg total) by mouth daily., Disp: 30 tablet, Rfl:   .  SUMAtriptan (IMITREX) 100 MG tablet, One tablet po prn migraine.  May repeat in 2 hrs if needed.  Do not exceed 200 mg per 24 hrs. (Patient taking differently: Take 100 mg by mouth every 2 (two) hours as needed for migraine or headache. May repeat in 2 hrs if needed.  Do not exceed 200 mg per 24 hrs.), Disp: 5 tablet, Rfl: 0 .  topiramate (TOPAMAX) 100 MG tablet, Take 1 tablet (100 mg total) by mouth at bedtime., Disp: , Rfl:  .  traZODone (DESYREL) 150 MG tablet, Take 1 tablet (150 mg total) by mouth at bedtime. (Patient taking differently: Take 200 mg by mouth at bedtime. ), Disp: 30 tablet, Rfl: 0  BP 122/61   Pulse 90   Ht 5\' 6"  (1.676 m)   Wt 223 lb (101.2 kg)   BMI 35.99 kg/m   Physical Exam  Constitutional: She is oriented to person, place, and time. She appears well-developed and well-nourished. No distress.  Cardiovascular: Normal rate and intact distal pulses.   Musculoskeletal:       Right knee: She exhibits effusion.  Neurological: She is alert and oriented to person, place, and time. She has normal reflexes. She exhibits normal muscle tone. Coordination normal.  Skin: Skin is warm and dry. No rash noted. She is not diaphoretic. No erythema. No pallor.  Psychiatric: Her speech is normal and behavior is normal. Judgment and thought content normal. Her affect is blunt. Cognition and memory are normal.    Right Knee Exam   Tenderness  The patient is experiencing tenderness in the medial joint line.  Range of Motion  Extension:  5 normal  Flexion:  110 normal   Muscle Strength   The patient has normal right knee strength.  Tests  [place]:  Medial - positive Lateral - negative Drawer:       Anterior - negative    Posterior - negative Varus: negative Valgus: negative  Other  Erythema: absent Scars: absent Sensation: normal Pulse: present Swelling: none Other tests: effusion present   Left Knee Exam   Tenderness  The patient is experiencing tenderness in  the medial joint line and lateral joint line.  Range of Motion  Extension: normal  Flexion:  120 normal   Muscle Strength   The patient has normal left knee strength.  Tests  [place]:  Medial - negative Lateral - negative Drawer:       Anterior - negative     Posterior - negative Varus: negative Valgus: negative  Other  Erythema: absent Scars: absent Sensation: normal Pulse:  present Swelling: none       ASSESSMENT: My personal interpretation of the images:  I have reviewed the imaging and it does show arthritis and torn medial meniscus      PLAN Arthroscopy right knee partial medial meniscectomy.  This procedure has been fully reviewed with the patient and written informed consent has been obtained.   Arther Abbott, MD 10/22/2015 2:55 PM

## 2015-10-22 NOTE — Patient Instructions (Signed)
You have received an injection of steroids into the joint. 15% of patients will have increased pain within the 24 hours postinjection.   This is transient and will go away.   We recommend that you use ice packs on the injection site for 20 minutes every 2 hours and extra strength Tylenol 2 tablets every 8 as needed until the pain resolves.  If you continue to have pain after taking the Tylenol and using the ice please call the office for further instructions.  You have decided to proceed with operative arthroscopy of the knee. You have decided not to continue with nonoperative measures such as but not limited to oral medication, weight loss, activity modification, physical therapy, bracing, or injection.  We will perform operative arthroscopy of the knee. Some of the risks associated with arthroscopic surgery of the knee include but are not limited to Bleeding Infection Swelling Stiffness Blood clot Pain  If you're not comfortable with these risks and would like to continue with nonoperative treatment please let Dr. Aline Brochure know prior to your surgery.

## 2015-10-26 NOTE — Patient Instructions (Signed)
Your procedure is scheduled on: 10/29/2015  Report to [facility] at   11:10  AM.  Call this number if you have problems the morning of surgery: [phone]   Remember:   Do not drink or eat food:After Midnight.  :  Take these medicines the morning of surgery with A SIP OF WATER: Abilify, Prozac, Gabapentin and levothyroxine   Do not wear jewelry, make-up or nail polish.  Do not wear lotions, powders, or perfumes. You may wear deodorant.  Do not shave 48 hours prior to surgery. Men may shave face and neck.  Do not bring valuables to the hospital.  Contacts, dentures or bridgework may not be worn into surgery.  Leave suitcase in the car. After surgery it may be brought to your room.  For patients admitted to the hospital, checkout time is 11:00 AM the day of discharge.   Patients discharged the day of surgery will not be allowed to drive home.    Special Instructions: Shower using CHG night before surgery and shower the day of surgery use CHG.  Use special wash - you have one bottle of CHG for all showers.  You should use approximately 1/2 of the bottle for each shower.  Arthroscopy, With Meniscus Injury, Care After Refer to this sheet in the next few weeks. These instructions provide you with general information on caring for yourself after your procedure. Your health care provider may also give you specific instructions. Your treatment has been planned according to the current medical practices, but problems sometimes occur. Call your health care provider if you have any problems or questions after your procedure. WHAT TO EXPECT AFTER THE PROCEDURE After your procedure, it is typical to have the following:  Pain and swelling in your knee.  Constipation.  Difficulty walking. HOME CARE INSTRUCTIONS   Use crutches and do knee exercises as directed by your health care provider.  Apply ice to the injured area:  Put ice in a plastic bag.  Place a towel between your skin and the  bag.  Leave the ice on for 15-20 minutes, 3-4 times a day while awake. Do this for the first 2 days.  Rest and raise (elevate) your knee.  Change bandages (dressings) as directed by your health care provider.  Keep the wound dry and clean. The wound may be washed gently with soap and water. Gently blot or dab the wound dry. It is okay to take showers 24-48 hours after surgery. Do not take baths, use swimming pools, or use hot tubs for 14 days, or as directed by your health care provider.  Only take over-the-counter or prescription medicines for pain, discomfort, or fever as directed by your health care provider.  Continue your normal diet as directed by your health care provider.  Do not lift anything more than 10 pounds or play contact sports for 3 weeks, or as directed by your health care provider.  If a brace was applied, use as directed by your health care provider.  Your health care provider will help with instructions for rehabilitation of your knee. SEEK MEDICAL CARE IF:   You have increased bleeding (more than a small spot) from the wound.  You have redness, swelling, or increasing pain in the wound.  Yellowish-white fluid (pus) is coming from your wound. SEEK IMMEDIATE MEDICAL CARE IF:   You develop a rash.  You have a fever or persistent symptoms for more than 2-3 days.  You have difficulty breathing.  You have increasing  pain with movement of the knee. MAKE SURE YOU:   Understand these instructions.  Will watch your condition.  Will get help right away if you are not doing well or get worse.   This information is not intended to replace advice given to you by your health care provider. Make sure you discuss any questions you have with your health care provider.   Document Released: 09/24/2004 Document Revised: 11/07/2012 Document Reviewed: 08/14/2012 Elsevier Interactive Patient Education 2016 [place] Anesthesia, Adult, Care After Refer to  this sheet in the next few weeks. These instructions provide you with information on caring for yourself after your procedure. Your health care provider may also give you more specific instructions. Your treatment has been planned according to current medical practices, but problems sometimes occur. Call your health care provider if you have any problems or questions after your procedure. WHAT TO EXPECT AFTER THE PROCEDURE After the procedure, it is typical to experience:  Sleepiness.  Nausea and vomiting. HOME CARE INSTRUCTIONS  For the first 24 hours after general anesthesia:  Have a responsible person with you.  Do not drive a car. If you are alone, do not take public transportation.  Do not drink alcohol.  Do not take medicine that has not been prescribed by your health care provider.  Do not sign important papers or make important decisions.  You may resume a normal diet and activities as directed by your health care provider.  Change bandages (dressings) as directed.  If you have questions or problems that seem related to general anesthesia, call the hospital and ask for the anesthetist or anesthesiologist on call. SEEK MEDICAL CARE IF:  You have nausea and vomiting that continue the day after anesthesia.  You develop a rash. SEEK IMMEDIATE MEDICAL CARE IF:   You have difficulty breathing.  You have chest pain.  You have any allergic problems.   This information is not intended to replace advice given to you by your health care provider. Make sure you discuss any questions you have with your health care provider.   Document Released: 06/13/2000 Document Revised: 03/28/2014 Document Reviewed: 07/06/2011 Elsevier Interactive Patient Education [employer].

## 2015-10-27 ENCOUNTER — Encounter (HOSPITAL_COMMUNITY)
Admission: RE | Admit: 2015-10-27 | Discharge: 2015-10-27 | Disposition: A | Discharge status: Home / Self Care | Payer: Medicaid Other | Source: Ambulatory Visit | Attending: Orthopedic Surgery | Admitting: Orthopedic Surgery

## 2015-10-27 ENCOUNTER — Encounter (HOSPITAL_COMMUNITY): Payer: Self-pay

## 2015-10-27 DIAGNOSIS — Z01812 Encounter for preprocedural laboratory examination: Secondary | ICD-10-CM | POA: Diagnosis not present

## 2015-10-27 LAB — BASIC METABOLIC PANEL
Anion gap: 8 (ref 5–15)
BUN: 29 mg/dL — ABNORMAL HIGH (ref 6–20)
CO2: 25 mmol/L (ref 22–32)
Calcium: 9.2 mg/dL (ref 8.9–10.3)
Chloride: 103 mmol/L (ref 101–111)
Creatinine, Ser: 0.9 mg/dL (ref 0.44–1.00)
GFR calc Af Amer: >60 mL/min (ref >60)
GFR calc non Af Amer: >60 mL/min (ref >60)
Glucose, Bld: 104 mg/dL — ABNORMAL HIGH (ref 65–99)
Potassium: 4.2 mmol/L (ref 3.5–5.1)
Sodium: 136 mmol/L (ref 135–145)

## 2015-10-27 LAB — CBC
HCT: 43.4 % (ref 36.0–46.0)
Hemoglobin: 14.6 g/dL (ref 12.0–15.0)
MCH: 31.7 pg (ref 26.0–34.0)
MCHC: 33.6 g/dL (ref 30.0–36.0)
MCV: 94.3 fL (ref 78.0–100.0)
Platelets: 299 10*3/uL (ref 150–400)
RBC: 4.6 MIL/uL (ref 3.87–5.11)
RDW: 13.1 % (ref 11.5–15.5)
WBC: 11 10*3/uL — ABNORMAL HIGH (ref 4.0–10.5)

## 2015-10-28 ENCOUNTER — Encounter (HOSPITAL_COMMUNITY): Payer: Self-pay | Admitting: *Deleted

## 2015-10-28 NOTE — H&P (Signed)
Progress Notes       Chief Complaint  Patient presents with  . Follow-up      MRI Results and injections bilateral knees    HPI 55 years old and female came in today for MRI results and injection both knees. She has had knee injections for arthritis for several months if not up to 1-2 years now but she had increased pain swelling locking catching of the right knee. We therefore sent her for an MRI which shows she has a torn meniscus after partial meniscectomy medial meniscus right knee  The pain has gotten worse, the pain is moderate to severe the swelling has gotten worse and the range of motion is worse;  and the catching locking is also worsen   Review of Systems  Constitutional: Negative for chills and fever.  Respiratory: Negative for wheezing.   Cardiovascular: Negative for chest pain.   All other systems were reviewed and were normal       Past Medical History:  Diagnosis Date  . Anxiety    . Bipolar disorder ([place])    . Depression    . Diabetes mellitus    . Elevated cholesterol    . Fibromyalgia    . GERD (gastroesophageal reflux disease)    . Migraine    . PTSD (post-traumatic stress disorder)      after death of son (accidental overdose)  . Thyroid disease             Past Surgical History:  Procedure Laterality Date  . CARPAL TUNNEL RELEASE   bilateral  . CHOLECYSTECTOMY      . COLONOSCOPY WITH PROPOFOL N/A 05/01/2012    [id number] COLON polyps/Mild diverticulosis was noted in the sigmoid colon/Moderate sized internal hemorrhoids. path with tubular adenoma  . gallbladder      . KNEE ARTHROSCOPY        bilateral  . POLYPECTOMY N/A 05/01/2012    Procedure: POLYPECTOMY;  Surgeon: Danie Binder, MD;  Location: [facility];  Service: Endoscopy;  Laterality: N/A;  . SHOULDER SURGERY   left  . VAGINAL HYSTERECTOMY             Family History  Problem Relation Age of Onset  . Cancer      . Diabetes      . Heart disease      . Arthritis      . Stroke Mother    .  Anxiety disorder Mother    . Cancer - Lung Father    . Lung disease      . Colon cancer Neg Hx      Social History  Substance Use Topics  . Smoking status: Former Smoker      Packs/day: 0.50      Years: 3.00  . Smokeless tobacco: Never Used  . Alcohol use No      Current Outpatient Prescriptions:  .  ARIPiprazole (ABILIFY) 20 MG tablet, Take 20 mg by mouth daily., Disp: , Rfl:  .  aspirin EC 81 MG tablet, Take 1 tablet (81 mg total) by mouth daily., Disp: , Rfl:  .  canagliflozin (INVOKANA) 300 MG TABS tablet, Take 300 mg by mouth daily., Disp: 30 tablet, Rfl:  .  clonazePAM (KLONOPIN) 0.5 MG tablet, Take 0.5 mg by mouth 3 (three) times daily., Disp: , Rfl:  .  Elastic Bandages & Supports (KNEE BRACE/HINGED/REGULAR) MISC, 1 each by Does not apply route daily. Apply knee brace daily to left knee, Disp: 1 each, Rfl:  0 .  exenatide (BYETTA) 10 MCG/0.04ML SOPN injection, Inject 0.04 mLs (10 mcg total) into the skin 2 (two) times daily with a meal. Pt takes 10 mcg twice daily, Disp: 2.4 mL, Rfl:  .  FLUoxetine (PROZAC) 20 MG capsule, Take 3 capsules (60 mg total) by mouth every morning., Disp: 90 capsule, Rfl: 0 .  gabapentin (NEURONTIN) 800 MG tablet, Take 800 mg by mouth 4 (four) times daily. , Disp: , Rfl:  .  glipiZIDE (GLUCOTROL XL) 5 MG 24 hr tablet, Take 5 mg by mouth daily with breakfast., Disp: , Rfl:  .  insulin glargine (LANTUS) 100 UNIT/ML injection, Inject 55 Units into the skin at bedtime. , Disp: , Rfl:  .  levothyroxine (SYNTHROID, LEVOTHROID) 150 MCG tablet, Take 150 mcg by mouth daily before breakfast., Disp: , Rfl:  .  metFORMIN (GLUCOPHAGE) 500 MG tablet, Take 2 tablets (1,000 mg total) by mouth 2 (two) times daily., Disp: , Rfl:  .  methocarbamol (ROBAXIN) 500 MG tablet, Take 1 tablet (500 mg total) by mouth 4 (four) times daily., Disp: 90 tablet, Rfl: 5 .  Oxycodone HCl 10 MG TABS, Take 10 mg by mouth 3 (three) times daily., Disp: , Rfl:  .  predniSONE (DELTASONE) 10 MG  tablet, Take 1 tablet (10 mg total) by mouth daily with breakfast., Disp: 10 tablet, Rfl: 1 .  simvastatin (ZOCOR) 40 MG tablet, Take 1 tablet (40 mg total) by mouth daily., Disp: 30 tablet, Rfl:  .  SUMAtriptan (IMITREX) 100 MG tablet, One tablet po prn migraine.  May repeat in 2 hrs if needed.  Do not exceed 200 mg per 24 hrs. (Patient taking differently: Take 100 mg by mouth every 2 (two) hours as needed for migraine or headache. May repeat in 2 hrs if needed.  Do not exceed 200 mg per 24 hrs.), Disp: 5 tablet, Rfl: 0 .  topiramate (TOPAMAX) 100 MG tablet, Take 1 tablet (100 mg total) by mouth at bedtime., Disp: , Rfl:  .  traZODone (DESYREL) 150 MG tablet, Take 1 tablet (150 mg total) by mouth at bedtime. (Patient taking differently: Take 200 mg by mouth at bedtime. ), Disp: 30 tablet, Rfl: 0   BP 122/61   Pulse 90   Ht 5\' 6"  (1.676 m)   Wt 223 lb (101.2 kg)   BMI 35.99 kg/m    Physical Exam  Constitutional: She is oriented to person, place, and time. She appears well-developed and well-nourished. No distress.  HEENT Head shows no trauma and no deformity Eyes have normal sclerae without icterus conjunctivae are normal lids are normal External ear canals are clean hearing is normal Nasal area shows normal mucous membranes Throat without organomegaly and clear with normal tracheal midline position Cardiovascular: Normal rate and intact distal pulses.   Musculoskeletal:       Right knee: She exhibits effusion.  Neurological: She is alert and oriented to person, place, and time. She has normal reflexes. She exhibits normal muscle tone. Coordination normal.  Skin: Skin is warm and dry. No rash noted. She is not diaphoretic. No erythema. No pallor.  Psychiatric: Her speech is normal and behavior is normal. Judgment and thought content normal. Her affect is blunt. Cognition and memory are normal.      Right Knee Exam    Tenderness  The patient is experiencing tenderness in the medial  joint line.   Range of Motion  Extension:  5 normal  Flexion:  110 normal    Muscle Strength  The patient has normal right knee strength.   Tests  Mc[place]ray:  Medial - positive Lateral - negative Drawer:       Anterior - negative    Posterior - negative Varus: negative Valgus: negative   Other  Erythema: absent Scars: absent Sensation: normal Pulse: present Swelling: none Other tests: effusion present     Left Knee Exam    Tenderness  The patient is experiencing tenderness in the medial joint line and lateral joint line.   Range of Motion  Extension: normal  Flexion:  120 normal    Muscle Strength    The patient has normal left knee strength.   Tests  McMurray:  Medial - negative Lateral - negative Drawer:       Anterior - negative     Posterior - negative Varus: negative Valgus: negative   Other  Erythema: absent Scars: absent Sensation: normal Pulse: present Swelling: none    ASSESSMENT: My personal interpretation of the images:  I have reviewed the imaging and it does show arthritis and torn medial meniscus right knee   PLAN Arthroscopy right knee partial medial meniscectomy.   This procedure has been fully reviewed with the patient and written informed consent has been obtained.     Arther Abbott, MD

## 2015-10-29 ENCOUNTER — Ambulatory Visit (HOSPITAL_COMMUNITY)
Admission: RE | Admit: 2015-10-29 | Discharge: 2015-10-29 | Disposition: A | Discharge status: Home / Self Care | Payer: Medicaid Other | Source: Ambulatory Visit | Attending: Orthopedic Surgery | Admitting: Orthopedic Surgery

## 2015-10-29 ENCOUNTER — Ambulatory Visit (HOSPITAL_COMMUNITY): Payer: Medicaid Other | Admitting: Anesthesiology

## 2015-10-29 ENCOUNTER — Encounter (HOSPITAL_COMMUNITY): Payer: Self-pay | Admitting: *Deleted

## 2015-10-29 ENCOUNTER — Encounter (HOSPITAL_COMMUNITY)
Admission: RE | Disposition: A | Discharge status: Home / Self Care | Payer: Self-pay | Source: Ambulatory Visit | Attending: Orthopedic Surgery

## 2015-10-29 DIAGNOSIS — X58XXXA Exposure to other specified factors, initial encounter: Secondary | ICD-10-CM | POA: Insufficient documentation

## 2015-10-29 DIAGNOSIS — Z79899 Other long term (current) drug therapy: Secondary | ICD-10-CM | POA: Diagnosis not present

## 2015-10-29 DIAGNOSIS — M2241 Chondromalacia patellae, right knee: Secondary | ICD-10-CM | POA: Diagnosis not present

## 2015-10-29 DIAGNOSIS — S83242A Other tear of medial meniscus, current injury, left knee, initial encounter: Secondary | ICD-10-CM | POA: Diagnosis not present

## 2015-10-29 DIAGNOSIS — F419 Anxiety disorder, unspecified: Secondary | ICD-10-CM | POA: Insufficient documentation

## 2015-10-29 DIAGNOSIS — E119 Type 2 diabetes mellitus without complications: Secondary | ICD-10-CM | POA: Insufficient documentation

## 2015-10-29 DIAGNOSIS — Z794 Long term (current) use of insulin: Secondary | ICD-10-CM | POA: Diagnosis not present

## 2015-10-29 DIAGNOSIS — Z6836 Body mass index (BMI) 36.0-36.9, adult: Secondary | ICD-10-CM | POA: Diagnosis not present

## 2015-10-29 DIAGNOSIS — M797 Fibromyalgia: Secondary | ICD-10-CM | POA: Insufficient documentation

## 2015-10-29 DIAGNOSIS — S83241A Other tear of medial meniscus, current injury, right knee, initial encounter: Secondary | ICD-10-CM | POA: Insufficient documentation

## 2015-10-29 DIAGNOSIS — F319 Bipolar disorder, unspecified: Secondary | ICD-10-CM | POA: Diagnosis not present

## 2015-10-29 HISTORY — PX: KNEE ARTHROSCOPY WITH MEDIAL MENISECTOMY: SHX5651

## 2015-10-29 LAB — GLUCOSE, CAPILLARY
Glucose-Capillary: 113 mg/dL — ABNORMAL HIGH (ref 65–99)
Glucose-Capillary: 106 mg/dL — ABNORMAL HIGH (ref 65–99)

## 2015-10-29 SURGERY — ARTHROSCOPY, KNEE, WITH MEDIAL MENISCECTOMY
Anesthesia: General | Laterality: Right

## 2015-10-29 MED ORDER — OXYCODONE HCL 5 MG PO TABS
ORAL_TABLET | ORAL | Status: AC
  Filled 2015-10-29: qty 1

## 2015-10-29 MED ORDER — CHLORHEXIDINE GLUCONATE 4 % EX LIQD: 60.0000 mL | Freq: Once | CUTANEOUS | Status: DC

## 2015-10-29 MED ORDER — BUPIVACAINE-EPINEPHRINE (PF) 0.5% -1:200000 IJ SOLN
INTRAMUSCULAR | Status: AC
  Filled 2015-10-29: qty 60

## 2015-10-29 MED ORDER — ONDANSETRON HCL 4 MG/2ML IJ SOLN
INTRAMUSCULAR | Status: AC
  Filled 2015-10-29: qty 2

## 2015-10-29 MED ORDER — PROPOFOL 10 MG/ML IV BOLUS
INTRAVENOUS | Status: DC | PRN
  Administered 2015-10-29: 140 mg via INTRAVENOUS

## 2015-10-29 MED ORDER — PREGABALIN 50 MG PO CAPS
50.0000 mg | ORAL_CAPSULE | Freq: Once | ORAL | Status: AC
  Administered 2015-10-29: 50 mg via ORAL

## 2015-10-29 MED ORDER — LIDOCAINE HCL (PF) 1 % IJ SOLN
INTRAMUSCULAR | Status: AC
  Filled 2015-10-29: qty 5

## 2015-10-29 MED ORDER — FENTANYL CITRATE (PF) 100 MCG/2ML IJ SOLN
INTRAMUSCULAR | Status: AC
  Filled 2015-10-29: qty 2

## 2015-10-29 MED ORDER — DEXAMETHASONE SODIUM PHOSPHATE 4 MG/ML IJ SOLN
INTRAMUSCULAR | Status: AC
  Filled 2015-10-29: qty 1

## 2015-10-29 MED ORDER — MIDAZOLAM HCL 2 MG/2ML IJ SOLN
INTRAMUSCULAR | Status: AC
  Filled 2015-10-29: qty 2

## 2015-10-29 MED ORDER — PREGABALIN 50 MG PO CAPS
ORAL_CAPSULE | ORAL | Status: AC
  Filled 2015-10-29: qty 1

## 2015-10-29 MED ORDER — CEFAZOLIN SODIUM-DEXTROSE 2-4 GM/100ML-% IV SOLN
INTRAVENOUS | Status: AC
  Filled 2015-10-29: qty 100

## 2015-10-29 MED ORDER — HYDROMORPHONE HCL 1 MG/ML IJ SOLN: 0.2500 mg | INTRAMUSCULAR | Status: DC | PRN

## 2015-10-29 MED ORDER — PROPOFOL 10 MG/ML IV BOLUS
INTRAVENOUS | Status: AC
  Filled 2015-10-29: qty 20

## 2015-10-29 MED ORDER — SODIUM CHLORIDE 0.9 % IR SOLN
Status: DC | PRN
  Administered 2015-10-29 (×2): 3000 mL

## 2015-10-29 MED ORDER — CEFAZOLIN SODIUM-DEXTROSE 2-4 GM/100ML-% IV SOLN
2.0000 g | INTRAVENOUS | Status: AC
  Administered 2015-10-29: 2 g via INTRAVENOUS

## 2015-10-29 MED ORDER — LIDOCAINE HCL 1 % IJ SOLN
INTRAMUSCULAR | Status: DC | PRN
  Administered 2015-10-29: 30 mg via INTRADERMAL

## 2015-10-29 MED ORDER — FENTANYL CITRATE (PF) 100 MCG/2ML IJ SOLN
INTRAMUSCULAR | Status: DC | PRN
  Administered 2015-10-29 (×3): 50 ug via INTRAVENOUS

## 2015-10-29 MED ORDER — EPINEPHRINE HCL 1 MG/ML IJ SOLN
INTRAMUSCULAR | Status: AC
  Filled 2015-10-29: qty 5

## 2015-10-29 MED ORDER — CELECOXIB 400 MG PO CAPS
400.0000 mg | ORAL_CAPSULE | Freq: Once | ORAL | Status: AC
  Administered 2015-10-29: 400 mg via ORAL

## 2015-10-29 MED ORDER — POVIDONE-IODINE 10 % EX SWAB: 2.0000 "application " | Freq: Once | CUTANEOUS | Status: DC

## 2015-10-29 MED ORDER — LACTATED RINGERS IV SOLN
INTRAVENOUS | Status: DC
  Administered 2015-10-29: 12:00:00 via INTRAVENOUS

## 2015-10-29 MED ORDER — HYDROCODONE-ACETAMINOPHEN 10-325 MG PO TABS: 1.0000 | ORAL_TABLET | ORAL | 0 refills | Status: DC | PRN

## 2015-10-29 MED ORDER — DEXAMETHASONE SODIUM PHOSPHATE 4 MG/ML IJ SOLN
4.0000 mg | Freq: Once | INTRAMUSCULAR | Status: AC
  Administered 2015-10-29: 4 mg via INTRAVENOUS

## 2015-10-29 MED ORDER — ONDANSETRON HCL 4 MG/2ML IJ SOLN
4.0000 mg | Freq: Once | INTRAMUSCULAR | Status: AC
  Administered 2015-10-29: 4 mg via INTRAVENOUS

## 2015-10-29 MED ORDER — BUPIVACAINE-EPINEPHRINE (PF) 0.5% -1:200000 IJ SOLN
INTRAMUSCULAR | Status: DC | PRN
  Administered 2015-10-29: 60 mL

## 2015-10-29 MED ORDER — CELECOXIB 400 MG PO CAPS
ORAL_CAPSULE | ORAL | Status: AC
  Filled 2015-10-29: qty 1

## 2015-10-29 MED ORDER — FENTANYL CITRATE (PF) 100 MCG/2ML IJ SOLN
25.0000 ug | INTRAMUSCULAR | Status: AC | PRN
  Administered 2015-10-29 (×2): 25 ug via INTRAVENOUS

## 2015-10-29 MED ORDER — OXYCODONE HCL 5 MG PO TABS
5.0000 mg | ORAL_TABLET | Freq: Once | ORAL | Status: AC
  Administered 2015-10-29: 5 mg via ORAL

## 2015-10-29 MED ORDER — SODIUM CHLORIDE 0.9 % IR SOLN
Status: DC | PRN
  Administered 2015-10-29: 1000 mL

## 2015-10-29 MED ORDER — MIDAZOLAM HCL 5 MG/5ML IJ SOLN
INTRAMUSCULAR | Status: DC | PRN
  Administered 2015-10-29 (×2): 2 mg via INTRAVENOUS

## 2015-10-29 MED ORDER — MIDAZOLAM HCL 2 MG/2ML IJ SOLN
1.0000 mg | INTRAMUSCULAR | Status: DC | PRN
  Administered 2015-10-29: 2 mg via INTRAVENOUS

## 2015-10-29 SURGICAL SUPPLY — 50 items
BAG HAMPER (MISCELLANEOUS) ×2 IMPLANT
BANDAGE ELASTIC 6 LF NS (GAUZE/BANDAGES/DRESSINGS) ×1 IMPLANT
BANDAGE ELASTIC 6 VELCRO NS (GAUZE/BANDAGES/DRESSINGS) ×2 IMPLANT
BLADE AGGRESSIVE PLUS 4.0 (BLADE) ×2 IMPLANT
BLADE SURG SZ11 CARB STEEL (BLADE) ×2 IMPLANT
BNDG CMPR MED 5X6 ELC HKLP NS (GAUZE/BANDAGES/DRESSINGS) ×1
CHLORAPREP W/TINT 26ML (MISCELLANEOUS) ×4 IMPLANT
CLOTH BEACON ORANGE TIMEOUT ST (SAFETY) ×2 IMPLANT
COOLER CRYO IC GRAV AND TUBE (ORTHOPEDIC SUPPLIES) ×2 IMPLANT
CUFF CRYO KNEE18X23 MED (MISCELLANEOUS) ×1 IMPLANT
CUFF TOURNIQUET SINGLE 34IN LL (TOURNIQUET CUFF) ×1 IMPLANT
DECANTER SPIKE VIAL GLASS SM (MISCELLANEOUS) ×4 IMPLANT
GAUZE SPONGE 4X4 12PLY STRL (GAUZE/BANDAGES/DRESSINGS) ×2 IMPLANT
GAUZE SPONGE 4X4 16PLY XRAY LF (GAUZE/BANDAGES/DRESSINGS) ×2 IMPLANT
GAUZE XEROFORM 5X9 LF (GAUZE/BANDAGES/DRESSINGS) ×2 IMPLANT
GLOVE BIOGEL PI IND STRL 7.0 (GLOVE) ×1 IMPLANT
GLOVE BIOGEL PI INDICATOR 7.0 (GLOVE) ×1
GLOVE ECLIPSE 6.5 STRL STRAW (GLOVE) ×1 IMPLANT
GLOVE SKINSENSE NS SZ8.0 LF (GLOVE) ×1
GLOVE SKINSENSE STRL SZ8.0 LF (GLOVE) ×1 IMPLANT
GLOVE SS N UNI LF 8.5 STRL (GLOVE) ×2 IMPLANT
GOWN STRL REUS W/ TWL LRG LVL3 (GOWN DISPOSABLE) ×1 IMPLANT
GOWN STRL REUS W/TWL LRG LVL3 (GOWN DISPOSABLE) ×2
GOWN STRL REUS W/TWL XL LVL3 (GOWN DISPOSABLE) ×2 IMPLANT
HLDR LEG FOAM (MISCELLANEOUS) ×1 IMPLANT
IV NS IRRIG 3000ML ARTHROMATIC (IV SOLUTION) ×4 IMPLANT
KIT BLADEGUARD II DBL (SET/KITS/TRAYS/PACK) ×2 IMPLANT
KIT ROOM TURNOVER AP CYSTO (KITS) ×2 IMPLANT
LEG HOLDER FOAM (MISCELLANEOUS) ×1
MANIFOLD NEPTUNE II (INSTRUMENTS) ×2 IMPLANT
MARKER SKIN DUAL TIP RULER LAB (MISCELLANEOUS) ×2 IMPLANT
NDL HYPO 18GX1.5 BLUNT FILL (NEEDLE) ×1 IMPLANT
NDL HYPO 21X1.5 SAFETY (NEEDLE) ×1 IMPLANT
NDL SPNL 18GX3.5 QUINCKE PK (NEEDLE) ×1 IMPLANT
NEEDLE HYPO 18GX1.5 BLUNT FILL (NEEDLE) ×2 IMPLANT
NEEDLE HYPO 21X1.5 SAFETY (NEEDLE) ×2 IMPLANT
NEEDLE SPNL 18GX3.5 QUINCKE PK (NEEDLE) ×2 IMPLANT
NS IRRIG 1000ML POUR BTL (IV SOLUTION) ×2 IMPLANT
PACK ARTHRO LIMB DRAPE STRL (MISCELLANEOUS) ×2 IMPLANT
PAD ABD 5X9 TENDERSORB (GAUZE/BANDAGES/DRESSINGS) ×2 IMPLANT
PAD ARMBOARD 7.5X6 YLW CONV (MISCELLANEOUS) ×2 IMPLANT
PADDING CAST COTTON 6X4 STRL (CAST SUPPLIES) ×2 IMPLANT
SET ARTHROSCOPY INST (INSTRUMENTS) ×2 IMPLANT
SET ARTHROSCOPY PUMP TUBE (IRRIGATION / IRRIGATOR) ×2 IMPLANT
SET BASIN LINEN APH (SET/KITS/TRAYS/PACK) ×2 IMPLANT
SUT ETHILON 3 0 FSL (SUTURE) ×1 IMPLANT
SYR 30ML LL (SYRINGE) ×2 IMPLANT
SYRINGE 10CC LL (SYRINGE) ×2 IMPLANT
TUBE CONNECTING 12X1/4 (SUCTIONS) ×6 IMPLANT
WAND 50 DEG COVAC W/CORD (SURGICAL WAND) ×1 IMPLANT

## 2015-10-29 NOTE — Anesthesia Procedure Notes (Signed)
Procedure Name: LMA Insertion Date/Time: 10/29/2015 1:06 PM Performed by: Charmaine Downs Pre-anesthesia Checklist: Patient identified, Patient being monitored, Emergency Drugs available, Timeout performed and Suction available Patient Re-evaluated:Patient Re-evaluated prior to inductionOxygen Delivery Method: Circle System Utilized Preoxygenation: Pre-oxygenation with 100% oxygen Intubation Type: IV induction Ventilation: Mask ventilation without difficulty LMA: LMA inserted LMA Size: 4.0 Number of attempts: 1 Placement Confirmation: positive ETCO2 and breath sounds checked- equal and bilateral Tube secured with: Tape Dental Injury: Teeth and Oropharynx as per pre-operative assessment

## 2015-10-29 NOTE — Discharge Instructions (Signed)
Arthroscopy, With Meniscus Injury, Care After Refer to this sheet in the next few weeks. These instructions provide you with general information on caring for yourself after your procedure. Your health care provider may also give you specific instructions. Your treatment has been planned according to the current medical practices, but problems sometimes occur. Call your health care provider if you have any problems or questions after your procedure. WHAT TO EXPECT AFTER THE PROCEDURE After your procedure, it is typical to have the following:  Pain and swelling in your knee.  Constipation.  Difficulty walking. HOME CARE INSTRUCTIONS   Use crutches and do knee exercises as directed by your health care provider.  Apply ice to the injured area:  Put ice in a plastic bag.  Place a towel between your skin and the bag.  Leave the ice on for 15-20 minutes, 3-4 times a day while awake. Do this for the first 2 days.  Rest and raise (elevate) your knee.  Change bandages (dressings) as directed by your health care provider.  Keep the wound dry and clean. The wound may be washed gently with soap and water. Gently blot or dab the wound dry. It is okay to take showers 24-48 hours after surgery. Do not take baths, use swimming pools, or use hot tubs for 14 days, or as directed by your health care provider.  Only take over-the-counter or prescription medicines for pain, discomfort, or fever as directed by your health care provider.  Continue your normal diet as directed by your health care provider.  Do not lift anything more than 10 pounds or play contact sports for 3 weeks, or as directed by your health care provider.  If a brace was applied, use as directed by your health care provider.  Your health care provider will help with instructions for rehabilitation of your knee. SEEK MEDICAL CARE IF:   You have increased bleeding (more than a small spot) from the wound.  You have redness,  swelling, or increasing pain in the wound.  Yellowish-white fluid (pus) is coming from your wound. SEEK IMMEDIATE MEDICAL CARE IF:   You develop a rash.  You have a fever or persistent symptoms for more than 2-3 days.  You have difficulty breathing.  You have increasing pain with movement of the knee. MAKE SURE YOU:   Understand these instructions.  Will watch your condition.  Will get help right away if you are not doing well or get worse.   This information is not intended to replace advice given to you by your health care provider. Make sure you discuss any questions you have with your health care provider.   Document Released: 09/24/2004 Document Revised: 11/07/2012 Document Reviewed: 08/14/2012 Elsevier Interactive Patient Education [employer].

## 2015-10-29 NOTE — Transfer of Care (Signed)
Immediate Anesthesia Transfer of Care Note  Patient: [name]  Procedure(s) Performed: Procedure(s): ARTHROSCOPY RIGHT KNEE  WITH PARTIAL MEDIAL MENISECTOMY (Right)  Patient Location: PACU  Anesthesia Type:General  Level of Consciousness: awake, oriented and patient cooperative  Airway & Oxygen Therapy: Patient Spontanous Breathing and Patient connected to face mask oxygen  Post-op Assessment: Report given to RN, Post -op Vital signs reviewed and stable and Patient moving all extremities  Post vital signs: Reviewed and stable  Last Vitals:  Vitals:   10/29/15 1240 10/29/15 1245  BP:  114/63  Pulse:    Resp: 10 10  Temp:      Last Pain:  Vitals:   10/29/15 1130  TempSrc:   PainSc: 5       Patients Stated Pain Goal: 8 ([id number])  Complications: No apparent anesthesia complications

## 2015-10-29 NOTE — Brief Op Note (Signed)
10/29/2015  1:52 PM  PATIENT:  [name]  55 y.o. 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  SURGEON:  Surgeon(s) and Role:    * Carole Civil, MD - Primary  There were no assistants  Anesthesia Gen.  EBL:  Total I/O In: 700 [I.V.:700] Out: 5 [Blood:5]  There was no blood administered  No drains  Used 60 mL of Marcaine with epinephrine  No specimens  Counts correct  No tourniquet  Dragon Dictation  Patient will be transferred to PACU and then to home  No need to delay pharmacologic V TE treatment is not necessary in this case  Knee arthroscopy dictation  The patient was identified in the preoperative holding area using 2 approved identification mechanisms. The chart was reviewed and updated. The surgical site was confirmed as right knee and marked with an indelible marker.  The patient was taken to the operating room for anesthesia. After successful  general anesthesia, 2 g of Ancef was used as IV antibiotics.  The patient was placed in the supine position with the (right leg) the operative extremity in an arthroscopic leg holder and the opposite extremity in a padded leg holder.  The timeout was executed.  A lateral portal was established with an 11 blade and the scope was introduced into the joint. A diagnostic arthroscopy was performed in circumferential manner examining the entire  knee joint. A medial portal was established and the diagnostic arthroscopy was repeated using a probe to palpate intra-articular structures as they were encountered.    The medial meniscus was encountered and had a vertical tear proximally one and half centimeters long in the area of the previous tear. The torn portion of the meniscus was resected using a duckbill forceps. The meniscal fragments were removed with a motorized shaver. The meniscus was balanced with a combination of a motorized shaver and a 50 ArthroCare wand until a stable rim was obtained.  The arthroscopic pump was placed on the wash mode and any excess debris was removed from the joint using suction.  60 cc of Marcaine with epinephrine was injected through the arthroscope.  The portals were closed with 3-0 nylon suture.  A sterile bandage, Ace wrap and Cryo/Cuff was placed and the Cryo/Cuff was activated. The patient was taken to the recovery room in stable condition.

## 2015-10-29 NOTE — Anesthesia Postprocedure Evaluation (Signed)
Anesthesia Post Note  Patient: [name]  Procedure(s) Performed: Procedure(s) (LRB): ARTHROSCOPY RIGHT KNEE  WITH PARTIAL MEDIAL MENISECTOMY (Right)  Patient location during evaluation: PACU Anesthesia Type: General Level of consciousness: awake and alert, patient cooperative and oriented Pain management: pain level controlled Vital Signs Assessment: post-procedure vital signs reviewed and stable Respiratory status: spontaneous breathing, nonlabored ventilation and respiratory function stable Cardiovascular status: blood pressure returned to baseline and stable Postop Assessment: no signs of nausea or vomiting Anesthetic complications: no    Last Vitals:  Vitals:   10/29/15 1245 10/29/15 1402  BP: 114/63 101/75  Pulse:  84  Resp: 10 11  Temp:  36.4 C    Last Pain:  Vitals:   10/29/15 1130  TempSrc:   PainSc: 5                  Keatyn Jawad J

## 2015-10-29 NOTE — Anesthesia Preprocedure Evaluation (Signed)
Anesthesia Evaluation  Patient identified by MRN, date of birth, ID band Patient awake    Reviewed: Allergy & Precautions, H&P , NPO status , Patient's Chart, lab work & pertinent test results  History of Anesthesia Complications Negative for: history of anesthetic complications  Airway Mallampati: II  TM Distance: >3 FB Neck ROM: Full    Dental  (+) Teeth Intact   Pulmonary Current Smoker, former smoker, PE: am cough.   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 ) Anxiety Depression Bipolar Disorder  Neuromuscular disease (sciatica)    GI/Hepatic GERD  Controlled,  Endo/Other  diabetes, Type 2, Oral Hypoglycemic AgentsMorbid obesity  Renal/GU      Musculoskeletal  (+) Fibromyalgia -  Abdominal   Peds  Hematology   Anesthesia Other Findings   Reproductive/Obstetrics                             Anesthesia Physical Anesthesia Plan  ASA: III  Anesthesia Plan: General   Post-op Pain Management:    Induction: Intravenous  Airway Management Planned: LMA  Additional Equipment:   Intra-op Plan:   Post-operative Plan: Extubation in OR  Informed Consent: I have reviewed the patients History and Physical, chart, labs and discussed the procedure including the risks, benefits and alternatives for the proposed anesthesia with the patient or authorized representative who has indicated his/her understanding and acceptance.     Plan Discussed with:   Anesthesia Plan Comments:         Anesthesia Quick Evaluation

## 2015-10-29 NOTE — Interval H&P Note (Signed)
History and Physical Interval Note:  10/29/2015 11:37 AM  [name]  has presented today for surgery, with the diagnosis of meniscal tear right knee  The various methods of treatment have been discussed with the patient and family. After consideration of risks, benefits and other options for treatment, the patient has consented to  Procedure(s) with comments: KNEE ARTHROSCOPY WITH MEDIAL MENISECTOMY (Right) - crutch training as a surgical intervention .  The patient's history has been reviewed, patient examined, no change in status, stable for surgery.  I have reviewed the patient's chart and labs.  Questions were answered to the patient's satisfaction.     Arther Abbott

## 2015-10-29 NOTE — Op Note (Signed)
10/29/2015  1:52 PM  PATIENT:  [name]  55 y.o. 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  SURGEON:  Surgeon(s) and Role:    * Carole Civil, MD - Primary  There were no assistants  Anesthesia Gen.  EBL:  Total I/O In: 700 [I.V.:700] Out: 5 [Blood:5]  There was no blood administered  No drains  Used 60 mL of Marcaine with epinephrine  No specimens  Counts correct  No tourniquet  Dragon Dictation  Patient will be transferred to PACU and then to home  No need to delay pharmacologic V TE treatment is not necessary in this case  Knee arthroscopy dictation  The patient was identified in the preoperative holding area using 2 approved identification mechanisms. The chart was reviewed and updated. The surgical site was confirmed as right knee and marked with an indelible marker.  The patient was taken to the operating room for anesthesia. After successful  general anesthesia, 2 g of Ancef was used as IV antibiotics.  The patient was placed in the supine position with the (right leg) the operative extremity in an arthroscopic leg holder and the opposite extremity in a padded leg holder.  The timeout was executed.  A lateral portal was established with an 11 blade and the scope was introduced into the joint. A diagnostic arthroscopy was performed in circumferential manner examining the entire  knee joint. A medial portal was established and the diagnostic arthroscopy was repeated using a probe to palpate intra-articular structures as they were encountered.    The medial meniscus was encountered and had a vertical tear proximally one and half centimeters long in the area of the previous tear. The torn portion of the meniscus was resected using a duckbill forceps. The meniscal fragments were removed with a motorized shaver. The meniscus was balanced with a combination of a motorized shaver and a 50 ArthroCare wand until a stable rim was obtained.  The arthroscopic pump was placed on the wash mode and any excess debris was removed from the joint using suction.  60 cc of Marcaine with epinephrine was injected through the arthroscope.  The portals were closed with 3-0 nylon suture.  A sterile bandage, Ace wrap and Cryo/Cuff was placed and the Cryo/Cuff was activated. The patient was taken to the recovery room in stable condition.

## 2015-11-02 ENCOUNTER — Ambulatory Visit: Payer: Self-pay | Admitting: Orthopedic Surgery

## 2015-11-02 ENCOUNTER — Telehealth: Payer: Self-pay | Admitting: Orthopedic Surgery

## 2015-11-02 NOTE — Telephone Encounter (Signed)
Done

## 2015-11-02 NOTE — Telephone Encounter (Signed)
CALLED PATIENT, NO ANSWER, LEFT VM

## 2015-11-02 NOTE — Telephone Encounter (Signed)
Patient called to inquire about (1) whether she may take a shower status post her arthroscopic knee surgery of 10/29/15                                          And (2) if needs to be seen for her first post operative visit prior to 11/12/15 Please call at: [phone]

## 2015-11-05 ENCOUNTER — Ambulatory Visit (INDEPENDENT_AMBULATORY_CARE_PROVIDER_SITE_OTHER): Payer: Medicaid Other | Admitting: Orthopedic Surgery

## 2015-11-05 ENCOUNTER — Encounter: Payer: Self-pay | Admitting: Orthopedic Surgery

## 2015-11-05 ENCOUNTER — Encounter (HOSPITAL_COMMUNITY): Payer: Self-pay | Admitting: Orthopedic Surgery

## 2015-11-05 VITALS — BP 107/60 | Ht 66.0 in | Wt 220.0 lb

## 2015-11-05 DIAGNOSIS — Z4789 Encounter for other orthopedic aftercare: Secondary | ICD-10-CM

## 2015-11-05 NOTE — Progress Notes (Signed)
Patient ID: [name], female   DOB: 07/31/1960, 55 y.o.   MRN: [id number]  Follow up visit  Chief Complaint  Patient presents with  . Follow-up    post op SARK, DOS 10/29/15    BP 107/60   Ht 5\' 6"  (1.676 m)   Wt 220 lb (99.8 kg)   BMI 35.51 kg/m   Encounter Diagnosis  Name Primary?  . Surgical aftercare, musculoskeletal system Yes  The portals are clean the sutures were removed  Knee flexion 80  Patient has Medicaid  Will have therapy at home follow-up 4 weeks  2:21 PM Arther Abbott, MD 11/05/2015

## 2015-11-05 NOTE — Telephone Encounter (Signed)
Spoke with patient and she was already advised by Environmental education officer

## 2015-11-05 NOTE — Patient Instructions (Signed)
Pt at home

## 2015-11-12 ENCOUNTER — Ambulatory Visit: Payer: Self-pay | Admitting: Orthopedic Surgery

## 2015-11-18 ENCOUNTER — Encounter: Payer: Self-pay | Admitting: Orthopedic Surgery

## 2015-11-18 ENCOUNTER — Ambulatory Visit (INDEPENDENT_AMBULATORY_CARE_PROVIDER_SITE_OTHER): Payer: Medicaid Other | Admitting: Orthopedic Surgery

## 2015-11-18 VITALS — BP 109/68 | HR 77 | Ht 66.0 in | Wt 221.0 lb

## 2015-11-18 DIAGNOSIS — L0889 Other specified local infections of the skin and subcutaneous tissue: Secondary | ICD-10-CM

## 2015-11-18 DIAGNOSIS — Z4789 Encounter for other orthopedic aftercare: Secondary | ICD-10-CM

## 2015-11-18 DIAGNOSIS — L089 Local infection of the skin and subcutaneous tissue, unspecified: Secondary | ICD-10-CM

## 2015-11-18 MED ORDER — SULFAMETHOXAZOLE-TRIMETHOPRIM 800-160 MG PO TABS: 1.0000 | ORAL_TABLET | Freq: Two times a day (BID) | ORAL | 0 refills | Status: DC

## 2015-11-18 MED ORDER — FLUCONAZOLE 150 MG PO TABS: 150.0000 mg | ORAL_TABLET | Freq: Every day | ORAL | 0 refills | Status: DC

## 2015-11-18 NOTE — Patient Instructions (Signed)
KEEP POST OP APPT

## 2015-11-18 NOTE — Addendum Note (Signed)
Addended by: Baldomero Lamy B on: 11/18/2015 03:41 PM   Modules accepted: Orders

## 2015-11-18 NOTE — Progress Notes (Signed)
Patient ID: [name], female   DOB: 11-05-1960, 55 y.o.   MRN: [id number]  Follow up visit  Chief Complaint  Patient presents with  . Routine Post Op    SARK DOS 10/29/15    Encounter Diagnoses  Name Primary?  . Surgical aftercare, musculoskeletal system Yes  . Skin pustule     BP 109/68   Pulse 77   Ht 5\' 6"  (1.676 m)   Wt 221 lb (100.2 kg)   BMI 35.67 kg/m  Came in for evaluation of pustule the developed lateral to the lateral portal  Her knee otherwise feels good her knee flexed 120 has full extension no swelling of the joint  Recommend the by mouth antibiotic and a Diflucan in case she gets yeast infection keep regular appointment 3:32 PM Arther Abbott, MD 11/18/2015

## 2015-11-24 ENCOUNTER — Telehealth: Payer: Self-pay | Admitting: Orthopedic Surgery

## 2015-11-24 NOTE — Telephone Encounter (Signed)
[facility] APPT FOR NEXT TUES

## 2015-11-24 NOTE — Telephone Encounter (Signed)
ROUTING TO DR HARRISON FOR REVIEW 

## 2015-11-24 NOTE — Telephone Encounter (Signed)
Patient left message on voicemail stating that she had surgery on 8/10 and she is hurting worse now than before surgery.  Please advise

## 2015-11-25 NOTE — Telephone Encounter (Signed)
Routing to schedule

## 2015-12-04 ENCOUNTER — Encounter: Payer: Self-pay | Admitting: Orthopedic Surgery

## 2015-12-04 ENCOUNTER — Ambulatory Visit (INDEPENDENT_AMBULATORY_CARE_PROVIDER_SITE_OTHER): Payer: Medicaid Other | Admitting: Orthopedic Surgery

## 2015-12-04 VITALS — BP 108/59 | HR 88 | Ht 66.0 in | Wt 221.0 lb

## 2015-12-04 DIAGNOSIS — Z9889 Other specified postprocedural states: Secondary | ICD-10-CM

## 2015-12-04 DIAGNOSIS — Z4789 Encounter for other orthopedic aftercare: Secondary | ICD-10-CM

## 2015-12-04 DIAGNOSIS — M79604 Pain in right leg: Secondary | ICD-10-CM

## 2015-12-04 NOTE — Progress Notes (Signed)
This is a follow-up visit  Postop status post arthroscopy right knee on August 10. She is 5 weeks from surgery complains of severe right medial knee pain no trauma  She's doing her own therapy  Using a cane  Put on some Bactrim for prophylactic treatment for pain she was having over the lateral portal  Exam shows tenderness throughout the right leg primarily on the medial side from the groin down to the foot. There is no erythema tenderness negative Homans sign  Recommend IM shot of Depo-Medrol. Follow-up one week  Cause unclear.

## 2015-12-06 ENCOUNTER — Encounter (HOSPITAL_COMMUNITY): Payer: Self-pay | Admitting: Emergency Medicine

## 2015-12-06 ENCOUNTER — Emergency Department (HOSPITAL_COMMUNITY): Payer: Medicaid Other

## 2015-12-06 ENCOUNTER — Emergency Department (HOSPITAL_COMMUNITY)
Admission: EM | Admit: 2015-12-06 | Discharge: 2015-12-06 | Disposition: A | Discharge status: Home / Self Care | Payer: Medicaid Other | Attending: Emergency Medicine | Admitting: Emergency Medicine

## 2015-12-06 DIAGNOSIS — Z7982 Long term (current) use of aspirin: Secondary | ICD-10-CM | POA: Diagnosis not present

## 2015-12-06 DIAGNOSIS — Z87891 Personal history of nicotine dependence: Secondary | ICD-10-CM | POA: Diagnosis not present

## 2015-12-06 DIAGNOSIS — M79604 Pain in right leg: Secondary | ICD-10-CM | POA: Diagnosis not present

## 2015-12-06 DIAGNOSIS — Z7984 Long term (current) use of oral hypoglycemic drugs: Secondary | ICD-10-CM | POA: Insufficient documentation

## 2015-12-06 DIAGNOSIS — Z79899 Other long term (current) drug therapy: Secondary | ICD-10-CM | POA: Diagnosis not present

## 2015-12-06 DIAGNOSIS — R0602 Shortness of breath: Secondary | ICD-10-CM | POA: Insufficient documentation

## 2015-12-06 DIAGNOSIS — E119 Type 2 diabetes mellitus without complications: Secondary | ICD-10-CM | POA: Diagnosis not present

## 2015-12-06 LAB — CBC WITH DIFFERENTIAL/PLATELET
Basophils Absolute: 0 10*3/uL (ref 0.0–0.1)
Basophils Relative: 0 %
Eosinophils Absolute: 0.2 10*3/uL (ref 0.0–0.7)
Eosinophils Relative: 2 %
HCT: 40.8 % (ref 36.0–46.0)
Hemoglobin: 14.1 g/dL (ref 12.0–15.0)
Lymphocytes Relative: 48 %
Lymphs Abs: 4.4 10*3/uL — ABNORMAL HIGH (ref 0.7–4.0)
MCH: 32.5 pg (ref 26.0–34.0)
MCHC: 34.6 g/dL (ref 30.0–36.0)
MCV: 94 fL (ref 78.0–100.0)
Monocytes Absolute: 0.5 10*3/uL (ref 0.1–1.0)
Monocytes Relative: 5 %
Neutro Abs: 4.2 10*3/uL (ref 1.7–7.7)
Neutrophils Relative %: 45 %
Platelets: 307 10*3/uL (ref 150–400)
RBC: 4.34 MIL/uL (ref 3.87–5.11)
RDW: 12.7 % (ref 11.5–15.5)
WBC: 9.4 10*3/uL (ref 4.0–10.5)

## 2015-12-06 LAB — COMPREHENSIVE METABOLIC PANEL
ALT: 49 U/L (ref 14–54)
AST: 36 U/L (ref 15–41)
Albumin: 3.9 g/dL (ref 3.5–5.0)
Alkaline Phosphatase: 67 U/L (ref 38–126)
Anion gap: 6 (ref 5–15)
BUN: 22 mg/dL — ABNORMAL HIGH (ref 6–20)
CO2: 21 mmol/L — ABNORMAL LOW (ref 22–32)
Calcium: 8.6 mg/dL — ABNORMAL LOW (ref 8.9–10.3)
Chloride: 109 mmol/L (ref 101–111)
Creatinine, Ser: 0.87 mg/dL (ref 0.44–1.00)
GFR calc Af Amer: >60 mL/min (ref >60)
GFR calc non Af Amer: >60 mL/min (ref >60)
Glucose, Bld: 255 mg/dL — ABNORMAL HIGH (ref 65–99)
Potassium: 3.3 mmol/L — ABNORMAL LOW (ref 3.5–5.1)
Sodium: 136 mmol/L (ref 135–145)
Total Bilirubin: 0.4 mg/dL (ref 0.3–1.2)
Total Protein: 6.9 g/dL (ref 6.5–8.1)

## 2015-12-06 LAB — D-DIMER, QUANTITATIVE: D-Dimer, Quant: 1.56 ug/mL-FEU — ABNORMAL HIGH (ref 0.00–0.50)

## 2015-12-06 LAB — TROPONIN I: Troponin I: <0.03 ng/mL (ref <0.03)

## 2015-12-06 MED ORDER — ENOXAPARIN SODIUM 100 MG/ML ~~LOC~~ SOLN
1.0000 mg/kg | Freq: Once | SUBCUTANEOUS | Status: AC
  Administered 2015-12-06: 100 mg via SUBCUTANEOUS
  Filled 2015-12-06: qty 1

## 2015-12-06 MED ORDER — IOPAMIDOL (ISOVUE-370) INJECTION 76%
100.0000 mL | Freq: Once | INTRAVENOUS | Status: AC | PRN
  Administered 2015-12-06: 100 mL via INTRAVENOUS

## 2015-12-06 NOTE — ED Triage Notes (Signed)
Pt reports SOB that started this morning. Pt stated that she took two Xanax's this morning that did not help. Pt also reports right leg hurts. Pt reports she had an infection in her right knee and completed Bactrim antibiotics this past week but leg was still inflamed.

## 2015-12-06 NOTE — ED Provider Notes (Signed)
[facility] DEPT Provider Note   CSN: [id number] Arrival date & time: 12/06/15  1712     History   Chief Complaint Chief Complaint  Patient presents with  . Shortness of Breath    HPI [name] is a 55 y.o. female.  Patient is a 55 year old female with multiple medical issues including insulin-dependent diabetes, bipolar, depression, fibromyalgia, migraines, and PTSD. She presents for evaluation of right leg pain and episodes of fluttering in her chest and shortness of breath which began earlier this morning. She has had multiple episodes of this fluttering, but does feel somewhat short of breath constantly.  She recently underwent arthroscopic surgery to repair a meniscal tear in July of this year. She also reports having an infection near one of the incisions and recently completed a course of Bactrim.      Past Medical History:  Diagnosis Date  . Anxiety   . Bipolar disorder ([place])   . Depression   . Diabetes mellitus   . Elevated cholesterol   . Fibromyalgia   . GERD (gastroesophageal reflux disease)   . Migraine   . PTSD (post-traumatic stress disorder)    after death of son (accidental overdose)  . Thyroid disease     Patient Active Problem List   Diagnosis Date Noted  . Acute medial meniscus tear of left knee   . Post traumatic stress disorder (PTSD) 11/08/2014  . MDD (major depressive disorder), recurrent severe, without psychosis ([place]) 11/08/2014  . Substance-induced psychotic disorder with hallucinations ([place]) 11/08/2014  . Panic disorder 11/08/2014  . Chronic pain syndrome 11/21/2013  . Primary osteoarthritis of both knees 11/21/2013  . Spinal stenosis of lumbar region 11/21/2013  . Difficulty in walking(719.7) 10/14/2013  . Sciatica associated with disorder of lumbar spine 09/17/2013  . Lower back pain 08/08/2013  . Lateral meniscus, posterior horn derangement 06/13/2013  . Arthritis of right knee 06/13/2013  . S/P medial meniscectomy of  right knee 06/13/2013  . Left shoulder pain 08/28/2012  . Rotator cuff syndrome of left shoulder 08/28/2012  . Rotator cuff tear 08/28/2012  . Pes anserinus bursitis 05/16/2012  . Encounter for screening colonoscopy 04/16/2012  . Fatty liver 04/16/2012  . Back pain 07/26/2011  . Knee bursitis, left 07/26/2011  . OA (osteoarthritis) of knee 07/26/2011  . Spinal stenosis 10/28/2010  . Medial meniscus, posterior horn derangement 08/12/2010  . Knee pain 08/04/2010  . Sciatica 08/04/2010  . H N P-LUMBAR 01/14/2010  . 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 LUMBOSACRAL STRAIN 05/12/2009  . DERANGEMENT OF POSTERIOR HORN OF MEDIAL MENISCUS 01/19/2009  . BACK PAIN 01/19/2009  . LOWER LEG, ARTHRITIS, DEGEN./OSTEO 12/02/2008  . JOINT EFFUSION, KNEE 11/10/2008  . ANSERINE BURSITIS, RIGHT 11/10/2008  . DERANGEMENT MENISCUS 10/15/2008  . KNEE PAIN 10/15/2008    Past Surgical History:  Procedure Laterality Date  . CARPAL TUNNEL RELEASE  bilateral  . CHOLECYSTECTOMY    . COLONOSCOPY WITH PROPOFOL N/A 05/01/2012   [id number] COLON polyps/Mild diverticulosis was noted in the sigmoid colon/Moderate sized internal hemorrhoids. path with tubular adenoma  . gallbladder    . KNEE ARTHROSCOPY     bilateral  . KNEE ARTHROSCOPY WITH MEDIAL MENISECTOMY Right 10/29/2015   Procedure: ARTHROSCOPY RIGHT KNEE  WITH PARTIAL MEDIAL MENISECTOMY;  Surgeon: Carole Civil, MD;  Location: [facility] ORS;  Service: Orthopedics;  Laterality: Right;  . POLYPECTOMY N/A 05/01/2012   Procedure: POLYPECTOMY;  Surgeon: Danie Binder, MD;  Location: [facility] ORS;  Service: Endoscopy;  Laterality: N/A;  .  SHOULDER SURGERY  left  . VAGINAL HYSTERECTOMY      OB History    No data available       Home Medications    Prior to Admission medications   Medication Sig Start Date End Date Taking? Authorizing Provider  ARIPiprazole (ABILIFY) 20 MG tablet Take 20 mg by mouth daily.    Historical Provider, MD  aspirin EC 81 MG tablet  Take 1 tablet (81 mg total) by mouth daily. 11/10/14   Niel Hummer, NP  canagliflozin (INVOKANA) 300 MG TABS tablet Take 300 mg by mouth daily. 11/10/14   Niel Hummer, NP  clonazePAM (KLONOPIN) 0.5 MG tablet Take 0.5 mg by mouth 3 (three) times daily.    Historical Provider, MD  Elastic Bandages & Supports (KNEE BRACE/HINGED/REGULAR) MISC 1 each by Does not apply route daily. Apply knee brace daily to left knee 03/09/15   Carole Civil, MD  exenatide (BYETTA) 10 MCG/0.04ML SOPN injection Inject 0.04 mLs (10 mcg total) into the skin 2 (two) times daily with a meal. Pt takes 10 mcg twice daily 11/10/14   Niel Hummer, NP  fluconazole (DIFLUCAN) 150 MG tablet Take 1 tablet (150 mg total) by mouth daily. 11/18/15   Carole Civil, MD  FLUoxetine (PROZAC) 20 MG capsule Take 3 capsules (60 mg total) by mouth every morning. 11/10/14   Niel Hummer, NP  gabapentin (NEURONTIN) 800 MG tablet Take 800 mg by mouth 4 (four) times daily.     Historical Provider, MD  glipiZIDE (GLUCOTROL XL) 5 MG 24 hr tablet Take 5 mg by mouth daily with breakfast.    Historical Provider, MD  HYDROcodone-acetaminophen (NORCO) 10-325 MG tablet Take 1 tablet by mouth every 4 (four) hours as needed. 10/29/15   Carole Civil, MD  insulin glargine (LANTUS) 100 UNIT/ML injection Inject 55 Units into the skin at bedtime.     Historical Provider, MD  levothyroxine (SYNTHROID, LEVOTHROID) 150 MCG tablet Take 150 mcg by mouth daily before breakfast.    Historical Provider, MD  metFORMIN (GLUCOPHAGE) 500 MG tablet Take 2 tablets (1,000 mg total) by mouth 2 (two) times daily. 11/10/14   Niel Hummer, NP  methocarbamol (ROBAXIN) 500 MG tablet Take 1 tablet (500 mg total) by mouth 4 (four) times daily. 10/07/15   Carole Civil, MD  Oxycodone HCl 10 MG TABS Take 10 mg by mouth 3 (three) times daily.    Historical Provider, MD  simvastatin (ZOCOR) 40 MG tablet Take 1 tablet (40 mg total) by mouth daily. Patient taking  differently: Take 40 mg by mouth daily at 6 PM.  11/10/14   Niel Hummer, NP  sulfamethoxazole-trimethoprim (BACTRIM DS,SEPTRA DS) 800-160 MG tablet Take 1 tablet by mouth 2 (two) times daily. 11/18/15   Carole Civil, MD  SUMAtriptan (IMITREX) 100 MG tablet One tablet po prn migraine.  May repeat in 2 hrs if needed.  Do not exceed 200 mg per 24 hrs. Patient taking differently: Take 100 mg by mouth every 2 (two) hours as needed for migraine or headache. May repeat in 2 hrs if needed.  Do not exceed 200 mg per 24 hrs. 06/26/12   Tammy Triplett, PA-C  topiramate (TOPAMAX) 100 MG tablet Take 1 tablet (100 mg total) by mouth at bedtime. 11/10/14   Niel Hummer, NP  traZODone (DESYREL) 100 MG tablet Take 200 mg by mouth at bedtime.    Historical Provider, MD    Family History Family History  Problem Relation Age of Onset  . Cancer    . Diabetes    . Heart disease    . Arthritis    . Stroke Mother   . Anxiety disorder Mother   . Cancer - Lung Father   . Lung disease    . Colon cancer Neg Hx     Social History Social History  Substance Use Topics  . Smoking status: Former Smoker    Packs/day: 0.50    Years: 3.00  . Smokeless tobacco: Never Used  . Alcohol use No     Allergies   Review of patient's allergies indicates no known allergies.   Review of Systems Review of Systems  All other systems reviewed and are negative.    Physical Exam Updated Vital Signs BP 113/67 (BP Location: Left Arm)   Pulse 85   Temp 97.9 F (36.6 C) (Oral)   Resp 20   Ht 5\' 6"  (1.676 m)   Wt 220 lb (99.8 kg)   SpO2 98%   BMI 35.51 kg/m   Physical Exam  Constitutional: She is oriented to person, place, and time. She appears well-developed and well-nourished. No distress.  HENT:  Head: Normocephalic and atraumatic.  Neck: Normal range of motion. Neck supple.  Cardiovascular: Normal rate, regular rhythm and normal heart sounds.  Exam reveals no gallop and no friction rub.   No murmur  heard. Pulmonary/Chest: Effort normal and breath sounds normal. No respiratory distress. She has no wheezes.  Abdominal: Soft. Bowel sounds are normal. She exhibits no distension. There is no tenderness.  Musculoskeletal: Normal range of motion. She exhibits no edema.  There is tenderness in the right calf, right thigh, and distal lower leg. DP pulses are palpable. Motor and sensory are intact to the entire foot.  Neurological: She is alert and oriented to person, place, and time.  Skin: Skin is warm and dry. She is not diaphoretic.  Nursing note and vitals reviewed.    ED Treatments / Results  Labs (all labs ordered are listed, but only abnormal results are displayed) Labs Reviewed  CBC WITH DIFFERENTIAL/PLATELET  COMPREHENSIVE METABOLIC PANEL  TROPONIN I  D-DIMER, QUANTITATIVE (NOT AT [facility])    EKG  EKG Interpretation  Date/Time:  Sunday December 06 2015 17:16:49 EDT Ventricular Rate:  84 PR Interval:  186 QRS Duration: 84 QT Interval:  384 QTC Calculation: 453 R Axis:   -35 Text Interpretation:  Normal sinus rhythm Left axis deviation Low voltage QRS Abnormal ECG Confirmed by Vernadine Coombs  MD, Kalyn Dimattia ([id number]) on 12/06/2015 6:02:14 PM       Radiology Dg Chest 2 View  Result Date: 12/06/2015 CLINICAL DATA:  Shortness of breath beginning this morning. EXAM: CHEST  2 VIEW COMPARISON:  09/25/2015 FINDINGS: Lungs are adequately inflated without consolidation or effusion. Cardiomediastinal silhouette is within normal. There is mild degenerate change of the spine. Subtle anterior wedging of a mid thoracic vertebral body unchanged. IMPRESSION: No active cardiopulmonary disease. Electronically Signed   By: Marin Olp M.D.   On: 12/06/2015 17:53    Procedures Procedures (including critical care time)  Medications Ordered in ED Medications - No data to display   Initial Impression / Assessment and Plan / ED Course  I have reviewed the triage vital signs and the nursing  notes.  Pertinent labs & imaging results that were available during my care of the patient were reviewed by me and considered in my medical decision making (see chart for details).  Clinical Course  Patient presents here with complaints of fluttering in her chest and shortness of breath. She recently had arthroscopic knee surgery performed. Workup today reveals an elevated d-dimer, however the remainder laboratory studies are unremarkable. She underwent a CT scan of her chest which was negative for pulmonary embolism or other acute process.  She is also complaining of pain in her right calf and right thigh. She will be given an IM dose of Lovenox and brought back tomorrow for an ultrasound of her leg to rule out DVT.  Nothing appears acute and she appears quite stable. I have seen no arrhythmias to explain the fluttering in her chest. If this persists she can follow-up with her primary Dr. to discuss referral to a cardiologist.  Final Clinical Impressions(s) / ED Diagnoses   Final diagnoses:  None    New Prescriptions New Prescriptions   No medications on file     Veryl Speak, MD 12/06/15 2346

## 2015-12-06 NOTE — Discharge Instructions (Signed)
Go to the radiology department tomorrow at the given time for an ultrasound of your leg to rule out a blood clot.  Return to the emergency department if symptoms significantly worsen in the meantime.

## 2015-12-07 ENCOUNTER — Ambulatory Visit (HOSPITAL_COMMUNITY)
Admit: 2015-12-07 | Discharge: 2015-12-07 | Disposition: A | Discharge status: Home / Self Care | Payer: Medicaid Other | Attending: Emergency Medicine | Admitting: Emergency Medicine

## 2015-12-07 ENCOUNTER — Other Ambulatory Visit (HOSPITAL_COMMUNITY): Payer: Self-pay | Admitting: Emergency Medicine

## 2015-12-07 DIAGNOSIS — M79604 Pain in right leg: Secondary | ICD-10-CM | POA: Diagnosis not present

## 2015-12-07 DIAGNOSIS — M7989 Other specified soft tissue disorders: Secondary | ICD-10-CM | POA: Insufficient documentation

## 2015-12-11 ENCOUNTER — Encounter: Payer: Self-pay | Admitting: Orthopedic Surgery

## 2015-12-11 ENCOUNTER — Ambulatory Visit (INDEPENDENT_AMBULATORY_CARE_PROVIDER_SITE_OTHER): Payer: Medicaid Other | Admitting: Orthopedic Surgery

## 2015-12-11 VITALS — BP 118/71 | HR 84 | Ht 66.0 in | Wt 220.0 lb

## 2015-12-11 DIAGNOSIS — Z9889 Other specified postprocedural states: Secondary | ICD-10-CM

## 2015-12-11 DIAGNOSIS — M5410 Radiculopathy, site unspecified: Secondary | ICD-10-CM

## 2015-12-11 DIAGNOSIS — Z4789 Encounter for other orthopedic aftercare: Secondary | ICD-10-CM

## 2015-12-11 MED ORDER — PREDNISONE 10 MG (48) PO TBPK: ORAL_TABLET | Freq: Every day | ORAL | 1 refills | Status: DC

## 2015-12-11 NOTE — Progress Notes (Signed)
Postop status post arthroscopy right knee on 10/29/2015.  Patient resented last visit with acute right leg pain over the medial side radiating from the groin down to the foot associated with severe pain despite oral opioid therapy. She has chronic back pain followed by Dr. Maryjean Ka. Previously seen by Dr. Joya Salm.  We gave her IM shot of cortisone 40 mg of Depo-Medrol with only slight improvement  Reexamination today reveals slight swelling in the right knee passive range of motion is 5-120 degrees. She does have some medial joint line tenderness and pain.  However she indicates that the whole leg is hurting and she is tender from the medial side of the right thigh down into the medial side of the right leg with extreme tenderness on the right side of her lower back moderate tenderness in the midline of the lower back and mild tenderness on the left side  Recommend Sterapred Dosepak  She will follow-up October 10 for her knee arthroscopy she will continue home exercises and she will see Dr. Maryjean Ka sometime next month  Encounter Diagnoses  Name Primary?  . Radiculitis of leg   . Surgical aftercare, musculoskeletal system Yes  . H/O arthroscopy of right knee

## 2015-12-11 NOTE — Patient Instructions (Signed)
12 day dose pack as take as prescribed

## 2016-01-01 ENCOUNTER — Ambulatory Visit (INDEPENDENT_AMBULATORY_CARE_PROVIDER_SITE_OTHER): Payer: Medicaid Other | Admitting: Orthopedic Surgery

## 2016-01-01 DIAGNOSIS — Z4789 Encounter for other orthopedic aftercare: Secondary | ICD-10-CM

## 2016-01-01 DIAGNOSIS — Z9889 Other specified postprocedural states: Secondary | ICD-10-CM

## 2016-01-01 DIAGNOSIS — M5410 Radiculopathy, site unspecified: Secondary | ICD-10-CM

## 2016-01-01 NOTE — Progress Notes (Signed)
Patient ID: [name], female   DOB: 18-Nov-1960, 55 y.o.   MRN: [id number]  Post op visit   Chief Complaint  Patient presents with  . Follow-up    Post op recheck on right knee, DOS 10-29-15.    Postop arthroscopy right knee. Patient had some unexplained pain in her medial groin radiating down her leg she now has some pain at the medial compartment radiating down the tibia related to her arthritis but she also has this posterior knee pain and leg pain related to sitting for 30 minutes at a car  Most likely that is related to her lumbar spine disease  Her medial compartment pain is related to her arthritis  She is much improved from the radiculitis we saw several weeks back  Recommend follow-up one month

## 2016-01-04 ENCOUNTER — Telehealth: Payer: Self-pay | Admitting: Orthopedic Surgery

## 2016-01-04 NOTE — Telephone Encounter (Signed)
Patient called to inquire about a brace for her right knee - states she forgot to ask Dr Aline Brochure at time of appointment on Friday, 01/01/16.  I asked if Dr Aline Brochure had recommended one since her surgery on this knee (perfomed 10/29/15); said, never mind, I'll just get one at [facility]."  I relayed I am entering a note for Dr Aline Brochure to advise, and she hung up.  I called back and was told she already went out the door.  Her ph#'s are [phone] [facility]) and [phone] [facility])

## 2016-01-04 NOTE — Telephone Encounter (Signed)
Routing to Dr Aline Brochure to advise if brace is appropriate

## 2016-02-01 ENCOUNTER — Ambulatory Visit: Payer: Self-pay | Admitting: Orthopedic Surgery

## 2016-02-08 ENCOUNTER — Encounter: Payer: Self-pay | Admitting: Orthopedic Surgery

## 2016-02-08 ENCOUNTER — Ambulatory Visit (INDEPENDENT_AMBULATORY_CARE_PROVIDER_SITE_OTHER): Payer: Medicaid Other | Admitting: Orthopedic Surgery

## 2016-02-08 DIAGNOSIS — M5410 Radiculopathy, site unspecified: Secondary | ICD-10-CM | POA: Diagnosis not present

## 2016-02-08 DIAGNOSIS — Z4789 Encounter for other orthopedic aftercare: Secondary | ICD-10-CM | POA: Diagnosis not present

## 2016-02-08 DIAGNOSIS — Z9889 Other specified postprocedural states: Secondary | ICD-10-CM

## 2016-02-08 DIAGNOSIS — M48061 Spinal stenosis, lumbar region without neurogenic claudication: Secondary | ICD-10-CM

## 2016-02-08 NOTE — Progress Notes (Signed)
Patient ID: [name], female   DOB: Apr 12, 1960, 55 y.o.   MRN: [id number]  Post op visit   Chief Complaint  Patient presents with  . Follow-up    SARK, DOS 10/29/15    Review of systems She's using the bathroom normally   I had knee arthroscopy right knee had some degenerative arthritis and he cleaned up the meniscus  But she's had radiculitis in the right leg. I would like her to get epidural series based on her MRI from 2016.  She is in severe pain is unrelieved by oxycodone prednisone gabapentin at 1600 mg a day  Exam of the right knee shows no swelling no tenderness she's regained 120 of flexion and stable strength is normal skin is intact pulses are good she has altered sensation in the right leg she is tender from the right lumbar spine right buttock all the way down into the right ankle   Encounter Diagnoses  Name Primary?  . Radiculitis of leg Yes  . H/O arthroscopy of right knee   . Surgical aftercare, musculoskeletal system   . Spinal stenosis of lumbar region, unspecified whether neurogenic claudication present

## 2016-02-17 ENCOUNTER — Ambulatory Visit (INDEPENDENT_AMBULATORY_CARE_PROVIDER_SITE_OTHER): Payer: Medicaid Other | Admitting: Podiatry

## 2016-02-17 NOTE — Progress Notes (Signed)
ERRONEOUS ENCOUNTER NO SHOW  

## 2016-02-18 ENCOUNTER — Ambulatory Visit
Admission: RE | Admit: 2016-02-18 | Discharge: 2016-02-18 | Disposition: A | Discharge status: Home / Self Care | Payer: Medicaid Other | Source: Ambulatory Visit | Attending: Orthopedic Surgery | Admitting: Orthopedic Surgery

## 2016-02-18 DIAGNOSIS — M48061 Spinal stenosis, lumbar region without neurogenic claudication: Secondary | ICD-10-CM

## 2016-02-18 MED ORDER — IOPAMIDOL (ISOVUE-M 200) INJECTION 41%
1.0000 mL | Freq: Once | INTRAMUSCULAR | Status: AC
  Administered 2016-02-18: 1 mL via EPIDURAL

## 2016-02-18 MED ORDER — METHYLPREDNISOLONE ACETATE 40 MG/ML INJ SUSP (RADIOLOG
120.0000 mg | Freq: Once | INTRAMUSCULAR | Status: AC
  Administered 2016-02-18: 120 mg via EPIDURAL

## 2016-02-18 NOTE — Discharge Instructions (Signed)

## 2016-03-09 ENCOUNTER — Other Ambulatory Visit: Payer: Self-pay | Admitting: Orthopedic Surgery

## 2016-03-09 DIAGNOSIS — M48061 Spinal stenosis, lumbar region without neurogenic claudication: Secondary | ICD-10-CM

## 2016-03-22 ENCOUNTER — Ambulatory Visit
Admission: RE | Admit: 2016-03-22 | Discharge: 2016-03-22 | Disposition: A | Discharge status: Home / Self Care | Payer: Medicaid Other | Source: Ambulatory Visit | Attending: Orthopedic Surgery | Admitting: Orthopedic Surgery

## 2016-03-22 DIAGNOSIS — M48061 Spinal stenosis, lumbar region without neurogenic claudication: Secondary | ICD-10-CM

## 2016-03-22 MED ORDER — IOPAMIDOL (ISOVUE-M 200) INJECTION 41%
1.0000 mL | Freq: Once | INTRAMUSCULAR | Status: AC
  Administered 2016-03-22: 1 mL via EPIDURAL

## 2016-03-22 MED ORDER — METHYLPREDNISOLONE ACETATE 40 MG/ML INJ SUSP (RADIOLOG
120.0000 mg | Freq: Once | INTRAMUSCULAR | Status: AC
  Administered 2016-03-22: 120 mg via EPIDURAL

## 2016-03-22 NOTE — Discharge Instructions (Signed)

## 2016-03-24 ENCOUNTER — Other Ambulatory Visit: Payer: Self-pay | Admitting: Orthopedic Surgery

## 2016-03-24 DIAGNOSIS — M48061 Spinal stenosis, lumbar region without neurogenic claudication: Secondary | ICD-10-CM

## 2016-04-12 ENCOUNTER — Ambulatory Visit
Admission: RE | Admit: 2016-04-12 | Discharge: 2016-04-12 | Disposition: A | Discharge status: Home / Self Care | Payer: Medicaid Other | Source: Ambulatory Visit | Attending: Orthopedic Surgery | Admitting: Orthopedic Surgery

## 2016-04-12 DIAGNOSIS — M48061 Spinal stenosis, lumbar region without neurogenic claudication: Secondary | ICD-10-CM

## 2016-04-12 MED ORDER — METHYLPREDNISOLONE ACETATE 40 MG/ML INJ SUSP (RADIOLOG
120.0000 mg | Freq: Once | INTRAMUSCULAR | Status: AC
  Administered 2016-04-12: 120 mg via EPIDURAL

## 2016-04-12 MED ORDER — IOPAMIDOL (ISOVUE-M 200) INJECTION 41%
1.0000 mL | Freq: Once | INTRAMUSCULAR | Status: AC
  Administered 2016-04-12: 1 mL via EPIDURAL

## 2016-04-12 NOTE — Discharge Instructions (Signed)

## 2016-05-16 ENCOUNTER — Emergency Department (HOSPITAL_COMMUNITY): Payer: Medicaid Other

## 2016-05-16 ENCOUNTER — Encounter (HOSPITAL_COMMUNITY): Payer: Self-pay | Admitting: *Deleted

## 2016-05-16 ENCOUNTER — Observation Stay (HOSPITAL_COMMUNITY)
Admission: EM | Admit: 2016-05-16 | Discharge: 2016-05-17 | Disposition: A | Discharge status: Home / Self Care | Payer: Medicaid Other | Attending: Internal Medicine | Admitting: Internal Medicine

## 2016-05-16 DIAGNOSIS — R0600 Dyspnea, unspecified: Secondary | ICD-10-CM | POA: Diagnosis not present

## 2016-05-16 DIAGNOSIS — R0602 Shortness of breath: Principal | ICD-10-CM | POA: Insufficient documentation

## 2016-05-16 DIAGNOSIS — Z720 Tobacco use: Secondary | ICD-10-CM

## 2016-05-16 DIAGNOSIS — Z7982 Long term (current) use of aspirin: Secondary | ICD-10-CM | POA: Diagnosis not present

## 2016-05-16 DIAGNOSIS — M7989 Other specified soft tissue disorders: Secondary | ICD-10-CM | POA: Diagnosis not present

## 2016-05-16 DIAGNOSIS — R079 Chest pain, unspecified: Secondary | ICD-10-CM | POA: Insufficient documentation

## 2016-05-16 DIAGNOSIS — F17210 Nicotine dependence, cigarettes, uncomplicated: Secondary | ICD-10-CM | POA: Insufficient documentation

## 2016-05-16 DIAGNOSIS — Z79899 Other long term (current) drug therapy: Secondary | ICD-10-CM | POA: Diagnosis not present

## 2016-05-16 DIAGNOSIS — Z794 Long term (current) use of insulin: Secondary | ICD-10-CM | POA: Diagnosis not present

## 2016-05-16 DIAGNOSIS — R609 Edema, unspecified: Secondary | ICD-10-CM | POA: Insufficient documentation

## 2016-05-16 DIAGNOSIS — R0789 Other chest pain: Secondary | ICD-10-CM | POA: Diagnosis not present

## 2016-05-16 DIAGNOSIS — R0609 Other forms of dyspnea: Secondary | ICD-10-CM | POA: Diagnosis present

## 2016-05-16 DIAGNOSIS — G894 Chronic pain syndrome: Secondary | ICD-10-CM | POA: Diagnosis present

## 2016-05-16 DIAGNOSIS — E119 Type 2 diabetes mellitus without complications: Secondary | ICD-10-CM | POA: Diagnosis not present

## 2016-05-16 LAB — TROPONIN I: Troponin I: <0.03 ng/mL (ref <0.03)

## 2016-05-16 LAB — CBC WITH DIFFERENTIAL/PLATELET
Basophils Absolute: 0 10*3/uL (ref 0.0–0.1)
Basophils Relative: 0 %
Eosinophils Absolute: 0.1 10*3/uL (ref 0.0–0.7)
Eosinophils Relative: 1 %
HCT: 39.5 % (ref 36.0–46.0)
Hemoglobin: 13.7 g/dL (ref 12.0–15.0)
Lymphocytes Relative: 53 %
Lymphs Abs: 4.6 10*3/uL — ABNORMAL HIGH (ref 0.7–4.0)
MCH: 32.3 pg (ref 26.0–34.0)
MCHC: 34.7 g/dL (ref 30.0–36.0)
MCV: 93.2 fL (ref 78.0–100.0)
Monocytes Absolute: 0.5 10*3/uL (ref 0.1–1.0)
Monocytes Relative: 6 %
Neutro Abs: 3.5 10*3/uL (ref 1.7–7.7)
Neutrophils Relative %: 40 %
Platelets: 260 10*3/uL (ref 150–400)
RBC: 4.24 MIL/uL (ref 3.87–5.11)
RDW: 12.8 % (ref 11.5–15.5)
WBC: 8.7 10*3/uL (ref 4.0–10.5)

## 2016-05-16 LAB — BRAIN NATRIURETIC PEPTIDE: B Natriuretic Peptide: 11 pg/mL (ref 0.0–100.0)

## 2016-05-16 LAB — BASIC METABOLIC PANEL
Anion gap: 12 (ref 5–15)
BUN: 21 mg/dL — ABNORMAL HIGH (ref 6–20)
CO2: 24 mmol/L (ref 22–32)
Calcium: 9.4 mg/dL (ref 8.9–10.3)
Chloride: 100 mmol/L — ABNORMAL LOW (ref 101–111)
Creatinine, Ser: 1.1 mg/dL — ABNORMAL HIGH (ref 0.44–1.00)
GFR calc Af Amer: >60 mL/min (ref >60)
GFR calc non Af Amer: 55 mL/min — ABNORMAL LOW (ref >60)
Glucose, Bld: 348 mg/dL — ABNORMAL HIGH (ref 65–99)
Potassium: 4.1 mmol/L (ref 3.5–5.1)
Sodium: 136 mmol/L (ref 135–145)

## 2016-05-16 LAB — D-DIMER, QUANTITATIVE: D-Dimer, Quant: 1.25 ug/mL-FEU — ABNORMAL HIGH (ref 0.00–0.50)

## 2016-05-16 MED ORDER — RIVAROXABAN 15 MG PO TABS
ORAL_TABLET | ORAL | Status: AC
  Filled 2016-05-16: qty 1

## 2016-05-16 MED ORDER — OXYCODONE HCL 5 MG PO TABS
10.0000 mg | ORAL_TABLET | Freq: Three times a day (TID) | ORAL | Status: DC | PRN
  Administered 2016-05-17: 10 mg via ORAL
  Filled 2016-05-16: qty 2

## 2016-05-16 MED ORDER — LEVOTHYROXINE SODIUM 75 MCG PO TABS
150.0000 ug | ORAL_TABLET | Freq: Every day | ORAL | Status: DC
  Administered 2016-05-17: 150 ug via ORAL
  Filled 2016-05-16: qty 2

## 2016-05-16 MED ORDER — ASPIRIN 81 MG PO CHEW
324.0000 mg | CHEWABLE_TABLET | Freq: Once | ORAL | Status: AC
  Administered 2016-05-16: 324 mg via ORAL
  Filled 2016-05-16: qty 4

## 2016-05-16 MED ORDER — SODIUM CHLORIDE 0.9 % IV BOLUS (SEPSIS)
1000.0000 mL | Freq: Once | INTRAVENOUS | Status: AC
  Administered 2016-05-16: 1000 mL via INTRAVENOUS

## 2016-05-16 MED ORDER — ACETAMINOPHEN 325 MG PO TABS
650.0000 mg | ORAL_TABLET | Freq: Once | ORAL | Status: AC
  Administered 2016-05-16: 650 mg via ORAL
  Filled 2016-05-16: qty 2

## 2016-05-16 MED ORDER — CLONAZEPAM 0.5 MG PO TABS
0.5000 mg | ORAL_TABLET | Freq: Three times a day (TID) | ORAL | Status: DC
  Administered 2016-05-16 – 2016-05-17 (×2): 0.5 mg via ORAL
  Filled 2016-05-16 (×2): qty 1

## 2016-05-16 MED ORDER — QUETIAPINE FUMARATE 100 MG PO TABS
100.0000 mg | ORAL_TABLET | Freq: Every day | ORAL | Status: DC
  Administered 2016-05-16: 100 mg via ORAL
  Filled 2016-05-16: qty 1

## 2016-05-16 MED ORDER — GABAPENTIN 400 MG PO CAPS
800.0000 mg | ORAL_CAPSULE | Freq: Four times a day (QID) | ORAL | Status: DC
  Administered 2016-05-16 – 2016-05-17 (×2): 800 mg via ORAL
  Filled 2016-05-16 (×2): qty 2

## 2016-05-16 MED ORDER — SODIUM CHLORIDE 0.9 % IV SOLN: 250.0000 mL | INTRAVENOUS | Status: DC | PRN

## 2016-05-16 MED ORDER — INSULIN GLARGINE 100 UNIT/ML ~~LOC~~ SOLN
55.0000 [IU] | Freq: Every day | SUBCUTANEOUS | Status: DC
  Administered 2016-05-16: 55 [IU] via SUBCUTANEOUS
  Filled 2016-05-16 (×2): qty 0.55

## 2016-05-16 MED ORDER — TOPIRAMATE 100 MG PO TABS
100.0000 mg | ORAL_TABLET | Freq: Every day | ORAL | Status: DC
  Administered 2016-05-16: 100 mg via ORAL
  Filled 2016-05-16: qty 1

## 2016-05-16 MED ORDER — SODIUM CHLORIDE 0.9% FLUSH: 3.0000 mL | INTRAVENOUS | Status: DC | PRN

## 2016-05-16 MED ORDER — RIVAROXABAN 15 MG PO TABS
15.0000 mg | ORAL_TABLET | Freq: Once | ORAL | Status: AC
  Administered 2016-05-16: 15 mg via ORAL
  Filled 2016-05-16: qty 1

## 2016-05-16 MED ORDER — INSULIN ASPART 100 UNIT/ML ~~LOC~~ SOLN: 0.0000 [IU] | Freq: Three times a day (TID) | SUBCUTANEOUS | Status: DC

## 2016-05-16 MED ORDER — ALBUTEROL SULFATE (2.5 MG/3ML) 0.083% IN NEBU
5.0000 mg | INHALATION_SOLUTION | Freq: Once | RESPIRATORY_TRACT | Status: AC
Start: 2016-05-16 — End: 2016-05-16
  Administered 2016-05-16: 5 mg via RESPIRATORY_TRACT
  Filled 2016-05-16: qty 6

## 2016-05-16 MED ORDER — BUPROPION HCL ER (SR) 100 MG PO TB12
100.0000 mg | ORAL_TABLET | Freq: Every day | ORAL | Status: DC
  Administered 2016-05-17: 100 mg via ORAL
  Filled 2016-05-16 (×2): qty 1

## 2016-05-16 MED ORDER — PREDNISONE 50 MG PO TABS
60.0000 mg | ORAL_TABLET | Freq: Once | ORAL | Status: AC
  Administered 2016-05-16: 22:00:00 60 mg via ORAL
  Filled 2016-05-16: qty 1

## 2016-05-16 MED ORDER — INFLUENZA VAC SPLIT QUAD 0.5 ML IM SUSY
0.5000 mL | PREFILLED_SYRINGE | INTRAMUSCULAR | Status: AC
  Administered 2016-05-17: 0.5 mL via INTRAMUSCULAR
  Filled 2016-05-16: qty 0.5

## 2016-05-16 MED ORDER — IOPAMIDOL (ISOVUE-370) INJECTION 76%
100.0000 mL | Freq: Once | INTRAVENOUS | Status: AC | PRN
  Administered 2016-05-16: 100 mL via INTRAVENOUS

## 2016-05-16 MED ORDER — SODIUM CHLORIDE 0.9% FLUSH
3.0000 mL | Freq: Two times a day (BID) | INTRAVENOUS | Status: DC
  Administered 2016-05-16 – 2016-05-17 (×2): 3 mL via INTRAVENOUS

## 2016-05-16 MED ORDER — NITROGLYCERIN 0.4 MG SL SUBL
SUBLINGUAL_TABLET | SUBLINGUAL | Status: AC
  Administered 2016-05-16: 0.4 mg
  Filled 2016-05-16: qty 1

## 2016-05-16 MED ORDER — ASPIRIN EC 81 MG PO TBEC
81.0000 mg | DELAYED_RELEASE_TABLET | Freq: Every day | ORAL | Status: DC
  Administered 2016-05-17: 81 mg via ORAL
  Filled 2016-05-16: qty 1

## 2016-05-16 MED ORDER — NITROGLYCERIN 0.4 MG SL SUBL
0.4000 mg | SUBLINGUAL_TABLET | SUBLINGUAL | Status: DC | PRN
  Administered 2016-05-16: 0.4 mg via SUBLINGUAL
  Filled 2016-05-16: qty 1

## 2016-05-16 NOTE — ED Triage Notes (Signed)
Pt comes in from home for shortness of breath. Pt states she has had some shortness of breath over the last month with increase over the last 2 weeks. In addition to this she has had pressure in her chest. This got worse over the last 4 hours. Pt alert and oriented. Oxygen 98% in triage.

## 2016-05-16 NOTE — ED Notes (Signed)
Pt c/o increased sob and chest pressure, repeat ekg performed, Dr Eulis Foster notified,

## 2016-05-16 NOTE — ED Notes (Signed)
ED Provider at bedside. 

## 2016-05-16 NOTE — ED Provider Notes (Signed)
[facility] DEPT Provider Note   CSN: [id number] Arrival date & time: 05/16/16  1626     History   Chief Complaint Chief Complaint  Patient presents with  . Shortness of Breath    HPI [name] is a 56 y.o. female.  She presents for evaluation of shortness of breath present for several days.  She also states she "lost my voice" one month ago.  She denies cough chest pain focal weakness or paresthesias.  She feels somewhat dizzy when she walks.  She denies a history of ongoing bronchitis or COPD.  She continues to smoke cigarettes.  There are no other known modifying factors  HPI  Past Medical History:  Diagnosis Date  . Anxiety   . Bipolar disorder ([place])   . Depression   . Diabetes mellitus   . Elevated cholesterol   . Fibromyalgia   . GERD (gastroesophageal reflux disease)   . Migraine   . PTSD (post-traumatic stress disorder)    after death of son (accidental overdose)  . Thyroid disease     Patient Active Problem List   Diagnosis Date Noted  . Acute medial meniscus tear of left knee   . Post traumatic stress disorder (PTSD) 11/08/2014  . MDD (major depressive disorder), recurrent severe, without psychosis ([place]) 11/08/2014  . Substance-induced psychotic disorder with hallucinations ([place]) 11/08/2014  . Panic disorder 11/08/2014  . Chronic pain syndrome 11/21/2013  . Primary osteoarthritis of both knees 11/21/2013  . Spinal stenosis of lumbar region 11/21/2013  . Difficulty in walking(719.7) 10/14/2013  . Sciatica associated with disorder of lumbar spine 09/17/2013  . Lower back pain 08/08/2013  . Lateral meniscus, posterior horn derangement 06/13/2013  . Arthritis of right knee 06/13/2013  . S/P medial meniscectomy of right knee 06/13/2013  . Left shoulder pain 08/28/2012  . Rotator cuff syndrome of left shoulder 08/28/2012  . Rotator cuff tear 08/28/2012  . Pes anserinus bursitis 05/16/2012  . Encounter for screening colonoscopy 04/16/2012  .  Fatty liver 04/16/2012  . Back pain 07/26/2011  . Knee bursitis, left 07/26/2011  . OA (osteoarthritis) of knee 07/26/2011  . Spinal stenosis 10/28/2010  . Medial meniscus, posterior horn derangement 08/12/2010  . Knee pain 08/04/2010  . Sciatica 08/04/2010  . H N P-LUMBAR 01/14/2010  . 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 LUMBOSACRAL STRAIN 05/12/2009  . DERANGEMENT OF POSTERIOR HORN OF MEDIAL MENISCUS 01/19/2009  . BACK PAIN 01/19/2009  . LOWER LEG, ARTHRITIS, DEGEN./OSTEO 12/02/2008  . JOINT EFFUSION, KNEE 11/10/2008  . ANSERINE BURSITIS, RIGHT 11/10/2008  . DERANGEMENT MENISCUS 10/15/2008  . KNEE PAIN 10/15/2008    Past Surgical History:  Procedure Laterality Date  . CARPAL TUNNEL RELEASE  bilateral  . CHOLECYSTECTOMY    . COLONOSCOPY WITH PROPOFOL N/A 05/01/2012   [id number] COLON polyps/Mild diverticulosis was noted in the sigmoid colon/Moderate sized internal hemorrhoids. path with tubular adenoma  . gallbladder    . KNEE ARTHROSCOPY     bilateral  . KNEE ARTHROSCOPY WITH MEDIAL MENISECTOMY Right 10/29/2015   Procedure: ARTHROSCOPY RIGHT KNEE  WITH PARTIAL MEDIAL MENISECTOMY;  Surgeon: Carole Civil, MD;  Location: [facility];  Service: Orthopedics;  Laterality: Right;  . POLYPECTOMY N/A 05/01/2012   Procedure: POLYPECTOMY;  Surgeon: Danie Binder, MD;  Location: [facility];  Service: Endoscopy;  Laterality: N/A;  . SHOULDER SURGERY  left  . VAGINAL HYSTERECTOMY      OB History    No data available       Home Medications  Prior to Admission medications   Medication Sig Start Date End Date Taking? Authorizing Provider  aspirin EC 81 MG tablet Take 1 tablet (81 mg total) by mouth daily. 11/10/14  Yes Niel Hummer, NP  buPROPion [facility] SR) 100 MG 12 hr tablet Take 100 mg by mouth daily.   Yes Historical Provider, MD  canagliflozin (INVOKANA) 300 MG TABS tablet Take 300 mg by mouth daily. 11/10/14  Yes Niel Hummer, NP  clonazePAM (KLONOPIN) 0.5 MG tablet Take  0.5 mg by mouth 3 (three) times daily.   Yes Historical Provider, MD  exenatide (BYETTA) 10 MCG/0.04ML SOPN injection Inject 0.04 mLs (10 mcg total) into the skin 2 (two) times daily with a meal. Pt takes 10 mcg twice daily 11/10/14  Yes Niel Hummer, NP  gabapentin (NEURONTIN) 800 MG tablet Take 800 mg by mouth 4 (four) times daily.    Yes Historical Provider, MD  glipiZIDE (GLUCOTROL XL) 5 MG 24 hr tablet Take 5 mg by mouth daily with breakfast.   Yes Historical Provider, MD  insulin glargine (LANTUS) 100 UNIT/ML injection Inject 55 Units into the skin at bedtime.    Yes Historical Provider, MD  levothyroxine (SYNTHROID, LEVOTHROID) 150 MCG tablet Take 150 mcg by mouth daily before breakfast.   Yes Historical Provider, MD  metFORMIN (GLUCOPHAGE) 500 MG tablet Take 2 tablets (1,000 mg total) by mouth 2 (two) times daily. 11/10/14  Yes Niel Hummer, NP  methocarbamol (ROBAXIN) 500 MG tablet Take 1 tablet (500 mg total) by mouth 4 (four) times daily. Patient taking differently: Take 500-1,000 mg by mouth daily as needed for muscle spasms.  10/07/15  Yes Carole Civil, MD  Oxycodone HCl 10 MG TABS Take 10 mg by mouth 3 (three) times daily as needed (for pain).    Yes Historical Provider, MD  QUEtiapine (SEROQUEL) 100 MG tablet Take 100 mg by mouth at bedtime.   Yes Historical Provider, MD  simvastatin (ZOCOR) 40 MG tablet Take 1 tablet (40 mg total) by mouth daily. Patient taking differently: Take 40 mg by mouth daily at 6 PM.  11/10/14  Yes Niel Hummer, NP  topiramate (TOPAMAX) 100 MG tablet Take 1 tablet (100 mg total) by mouth at bedtime. 11/10/14  Yes Niel Hummer, NP  SUMAtriptan (IMITREX) 100 MG tablet One tablet po prn migraine.  May repeat in 2 hrs if needed.  Do not exceed 200 mg per 24 hrs. Patient taking differently: Take 100 mg by mouth every 2 (two) hours as needed for migraine or headache. May repeat in 2 hrs if needed.  Do not exceed 200 mg per 24 hrs. 06/26/12   Tammy Triplett, PA-C      Family History Family History  Problem Relation Age of Onset  . Cancer    . Diabetes    . Heart disease    . Arthritis    . Stroke Mother   . Anxiety disorder Mother   . Cancer - Lung Father   . Lung disease    . Colon cancer Neg Hx     Social History Social History  Substance Use Topics  . Smoking status: Current Every Day Smoker    Packs/day: 2.00    Years: 3.00    Types: Cigarettes  . Smokeless tobacco: Never Used  . Alcohol use No     Allergies   Patient has no known allergies.   Review of Systems Review of Systems  All other systems reviewed and are negative.  Physical Exam Updated Vital Signs BP 129/77 (BP Location: Left Arm)   Pulse 99   Temp 97.4 F (36.3 C)   Resp 18   Ht 5\' 6"  (1.676 m)   Wt 227 lb (103 kg)   SpO2 96%   BMI 36.64 kg/m   Physical Exam  Constitutional: She is oriented to person, place, and time. She appears well-developed and well-nourished. No distress.  HENT:  Head: Normocephalic and atraumatic.  Eyes: Conjunctivae and EOM are normal. Pupils are equal, round, and reactive to light.  Neck: Normal range of motion and phonation normal. Neck supple.  Cardiovascular: Normal rate and regular rhythm.   Pulmonary/Chest: Effort normal and breath sounds normal. She exhibits no tenderness.  Abdominal: Soft. She exhibits no distension. There is no tenderness. There is no guarding.  Musculoskeletal: Normal range of motion.  Lower legs diffusely tender, patient citing fibromyalgia, with mild left lower leg swelling.  Neurological: She is alert and oriented to person, place, and time. She exhibits normal muscle tone.  Skin: Skin is warm and dry.  Psychiatric: She has a normal mood and affect. Her behavior is normal. Judgment and thought content normal.  Nursing note and vitals reviewed.    ED Treatments / Results  Labs (all labs ordered are listed, but only abnormal results are displayed) Labs Reviewed  BASIC METABOLIC PANEL -  Abnormal; Notable for the following:       Result Value   Chloride 100 (*)    Glucose, Bld 348 (*)    BUN 21 (*)    Creatinine, Ser 1.10 (*)    GFR calc non Af Amer 55 (*)    All other components within normal limits  CBC WITH DIFFERENTIAL/PLATELET - Abnormal; Notable for the following:    Lymphs Abs 4.6 (*)    All other components within normal limits  D-DIMER, QUANTITATIVE (NOT AT [facility]) - Abnormal; Notable for the following:    D-Dimer, Quant 1.25 (*)    All other components within normal limits    EKG  EKG Interpretation  Date/Time:  Monday May 16 2016 16:31:21 EST Ventricular Rate:  90 PR Interval:  148 QRS Duration: 84 QT Interval:  388 QTC Calculation: 474 R Axis:   14 Text Interpretation:  Normal sinus rhythm Low voltage QRS Borderline ECG since last tracing no significant change Confirmed by Eulis Foster  MD, Jahrel Borthwick [phone]) on 05/16/2016 4:50:21 PM       Radiology Dg Chest 2 View  Result Date: 05/16/2016 CLINICAL DATA:  Shortness of breath. EXAM: CHEST  2 VIEW COMPARISON:  12/06/2015 FINDINGS: The heart size and mediastinal contours are within normal limits. Both lungs are clear. The visualized skeletal structures are unremarkable. IMPRESSION: Normal chest. Electronically Signed   By: Lorriane Shire M.D.   On: 05/16/2016 17:13   Ct Angio Chest Pe W/cm &/or Wo Cm  Result Date: 05/16/2016 CLINICAL DATA:  Shortness of breath 2 weeks.  Chest pressure. EXAM: CT ANGIOGRAPHY CHEST WITH CONTRAST TECHNIQUE: Multidetector CT imaging of the chest was performed using the standard protocol during bolus administration of intravenous contrast. Multiplanar CT image reconstructions and MIPs were obtained to evaluate the vascular anatomy. CONTRAST:  100 mL Isovue 370 COMPARISON:  None. FINDINGS: Cardiovascular: Suboptimal opacification of the pulmonary arteries to the segmental level limiting evaluation for pulmonary embolus. No large central pulmonary embolus. Normal heart size. No  pericardial effusion. Normal caliber thoracic aorta without dissection. Mediastinum/Nodes: No enlarged mediastinal, hilar, or axillary lymph nodes. Thyroid gland, trachea, and esophagus  demonstrate no significant findings. Lungs/Pleura: Small linear area of airspace disease peripherally in the right lower lobe likely reflecting mild scarring. No focal consolidation. No pleural effusion or pneumothorax. Upper Abdomen: No acute abnormality. Musculoskeletal: No acute osseous abnormality. No lytic or sclerotic osseous lesion. Review of the MIP images confirms the above findings. IMPRESSION: 1. Suboptimal opacification of the pulmonary arteries to the segmental level limiting evaluation for pulmonary embolus. No large central pulmonary embolus. 2. No thoracic aortic aneurysm or dissection. Electronically Signed   By: Kathreen Devoid   On: 05/16/2016 20:13    Procedures Procedures (including critical care time)  Medications Ordered in ED Medications  Rivaroxaban (XARELTO) tablet 15 mg (not administered)  albuterol (PROVENTIL) (2.5 MG/3ML) 0.083% nebulizer solution 5 mg (5 mg Nebulization Given 05/16/16 1734)  sodium chloride 0.9 % bolus 1,000 mL (0 mLs Intravenous Stopped 05/16/16 2046)  iopamidol (ISOVUE-370) 76 % injection 100 mL (100 mLs Intravenous Contrast Given 05/16/16 1956)     Initial Impression / Assessment and Plan / ED Course  I have reviewed the triage vital signs and the nursing notes.  Pertinent labs & imaging results that were available during my care of the patient were reviewed by me and considered in my medical decision making (see chart for details).     Medications  Rivaroxaban (XARELTO) tablet 15 mg (not administered)  albuterol (PROVENTIL) (2.5 MG/3ML) 0.083% nebulizer solution 5 mg (5 mg Nebulization Given 05/16/16 1734)  sodium chloride 0.9 % bolus 1,000 mL (0 mLs Intravenous Stopped 05/16/16 2046)  iopamidol (ISOVUE-370) 76 % injection 100 mL (100 mLs Intravenous Contrast Given  05/16/16 1956)    Patient Vitals for the past 24 hrs:  BP Temp Pulse Resp SpO2 Height Weight  05/16/16 1859 129/77 - 99 18 96 % - -  05/16/16 1734 - - - - 95 % - -  05/16/16 1638 116/66 97.4 F (36.3 C) 89 18 98 % 5\' 6"  (1.676 m) 227 lb (103 kg)    8:35 PM Reevaluation with update and discussion. After initial assessment and treatment, an updated evaluation reveals she states her breathing is marginally better after the albuterol nebulizer.  There is no change in the lung exam at this time.  Findings discussed with patient and all questions answered.  She agrees with hospitalization, for further evaluation. Dolora Ridgely L    8:48 PM-Consult complete with Dr. Shanon Brow. Patient case explained and discussed. She agrees to admit patient for further evaluation and treatment. Call ended at 2100  Final Clinical Impressions(s) / ED Diagnoses   Final diagnoses:  Dyspnea, unspecified type  Chest pain, unspecified type  Left leg swelling  Tobacco abuse    Nonspecific chest pain with shortness of breath and left leg swelling.  Concern for PE and left leg DVT.  Unfortunately the CT angiogram was suboptimal.  She had another CT angiogram in September of last year.  Patient is diabetic on metformin.  I do not believe it would be in her best interest to repeat a CT angiogram at this time.  She can be evaluated further with a Leg Doppler +/- VQ scan, tomorrow.  In the meantime covering her with Xarelto and admitting for observation would be a safe approach to both diagnose and treat these concurrent problems.   Nursing Notes Reviewed/ Care Coordinated Applicable Imaging Reviewed Interpretation of Laboratory Data incorporated into ED treatment  Plan: Admit New Prescriptions New Prescriptions   No medications on file     Daleen Bo, MD 05/16/16 2100

## 2016-05-16 NOTE — ED Notes (Addendum)
Pt c/o sob for the past month, worse over the past two weeks and is worse with exertion,  pt also states that she has recently had swelling to left lower extremity that is gone now,

## 2016-05-16 NOTE — ED Notes (Signed)
Patient transported to CT 

## 2016-05-16 NOTE — ED Notes (Signed)
Pt states that she continues to have chest pain/pressure on right side of chest area, left side of chest area the pain/pressure is increased with breathing,

## 2016-05-16 NOTE — H&P (Signed)
History and Physical    [name] [id number] DOB: [birth year] DOA: 05/16/2016  PCP: Asencion Noble, MD  Patient coming from:  home  Chief Complaint:   Sob, left leg swelling  HPI: [name] is a 56 y.o. female with medical history significant of DM, anxiety, PTSD, HLD comes to the ED with 2 days of sob which is worse with exertion that has progressed to chest pressure sensation "tightness" across her chest for the last day or so that is also worse with activity.  Pt has been in ED with continued "chest pressure".  She denies any fevers.  No cough.  She does note some mild left leg swelling.  No pain in the leg.  She has no cardiac history did have a heart cath in 2005 which she was told she had "mild blockage" which did not require any stenting at the time.  She denies any recent traveling, no trauma, no recent surgeries.  In the ED, pt had a positive Ddimer which subsequently led to CTA of chest which was of poor quality and could not rule out PE outside of the large vessels.  Per Dr Eulis Foster report, it was requested for pt to be admitted for an ultrasound of her leg and VQ scan to r/o PE, no mention of any chest pressure or pain.  obs bed on med surg was then advised to be requested.  Review of Systems: As per HPI otherwise 10 point review of systems negative.   Past Medical History:  Diagnosis Date  . Anxiety   . Bipolar disorder ([place])   . Depression   . Diabetes mellitus   . Elevated cholesterol   . Fibromyalgia   . GERD (gastroesophageal reflux disease)   . Migraine   . PTSD (post-traumatic stress disorder)    after death of son (accidental overdose)  . Thyroid disease     Past Surgical History:  Procedure Laterality Date  . CARPAL TUNNEL RELEASE  bilateral  . CHOLECYSTECTOMY    . COLONOSCOPY WITH PROPOFOL N/A 05/01/2012   [id number] COLON polyps/Mild diverticulosis was noted in the sigmoid colon/Moderate sized internal hemorrhoids. path with tubular adenoma  .  gallbladder    . KNEE ARTHROSCOPY     bilateral  . KNEE ARTHROSCOPY WITH MEDIAL MENISECTOMY Right 10/29/2015   Procedure: ARTHROSCOPY RIGHT KNEE  WITH PARTIAL MEDIAL MENISECTOMY;  Surgeon: Carole Civil, MD;  Location: [facility];  Service: Orthopedics;  Laterality: Right;  . POLYPECTOMY N/A 05/01/2012   Procedure: POLYPECTOMY;  Surgeon: Danie Binder, MD;  Location: [facility];  Service: Endoscopy;  Laterality: N/A;  . SHOULDER SURGERY  left  . VAGINAL HYSTERECTOMY       reports that she has been smoking Cigarettes.  She has a 6.00 pack-year smoking history. She has never used smokeless tobacco. She reports that she does not drink alcohol or use drugs.  No Known Allergies  Family History  Problem Relation Age of Onset  . Cancer    . Diabetes    . Heart disease    . Arthritis    . Stroke Mother   . Anxiety disorder Mother   . Cancer - Lung Father   . Lung disease    . Colon cancer Neg Hx     Prior to Admission medications   Medication Sig Start Date End Date Taking? Authorizing Provider  aspirin EC 81 MG tablet Take 1 tablet (81 mg total) by mouth daily. 11/10/14  Yes Niel Hummer, NP  buPROPion (  WELLBUTRIN SR) 100 MG 12 hr tablet Take 100 mg by mouth daily.   Yes Historical Provider, MD  canagliflozin (INVOKANA) 300 MG TABS tablet Take 300 mg by mouth daily. 11/10/14  Yes Niel Hummer, NP  clonazePAM (KLONOPIN) 0.5 MG tablet Take 0.5 mg by mouth 3 (three) times daily.   Yes Historical Provider, MD  exenatide (BYETTA) 10 MCG/0.04ML SOPN injection Inject 0.04 mLs (10 mcg total) into the skin 2 (two) times daily with a meal. Pt takes 10 mcg twice daily 11/10/14  Yes Niel Hummer, NP  gabapentin (NEURONTIN) 800 MG tablet Take 800 mg by mouth 4 (four) times daily.    Yes Historical Provider, MD  glipiZIDE (GLUCOTROL XL) 5 MG 24 hr tablet Take 5 mg by mouth daily with breakfast.   Yes Historical Provider, MD  insulin glargine (LANTUS) 100 UNIT/ML injection Inject 55 Units into the skin at  bedtime.    Yes Historical Provider, MD  levothyroxine (SYNTHROID, LEVOTHROID) 150 MCG tablet Take 150 mcg by mouth daily before breakfast.   Yes Historical Provider, MD  metFORMIN (GLUCOPHAGE) 500 MG tablet Take 2 tablets (1,000 mg total) by mouth 2 (two) times daily. 11/10/14  Yes Niel Hummer, NP  methocarbamol (ROBAXIN) 500 MG tablet Take 1 tablet (500 mg total) by mouth 4 (four) times daily. Patient taking differently: Take 500-1,000 mg by mouth daily as needed for muscle spasms.  10/07/15  Yes Carole Civil, MD  Oxycodone HCl 10 MG TABS Take 10 mg by mouth 3 (three) times daily as needed (for pain).    Yes Historical Provider, MD  QUEtiapine (SEROQUEL) 100 MG tablet Take 100 mg by mouth at bedtime.   Yes Historical Provider, MD  simvastatin (ZOCOR) 40 MG tablet Take 1 tablet (40 mg total) by mouth daily. Patient taking differently: Take 40 mg by mouth daily at 6 PM.  11/10/14  Yes Niel Hummer, NP  topiramate (TOPAMAX) 100 MG tablet Take 1 tablet (100 mg total) by mouth at bedtime. 11/10/14  Yes Niel Hummer, NP  SUMAtriptan (IMITREX) 100 MG tablet One tablet po prn migraine.  May repeat in 2 hrs if needed.  Do not exceed 200 mg per 24 hrs. Patient taking differently: Take 100 mg by mouth every 2 (two) hours as needed for migraine or headache. May repeat in 2 hrs if needed.  Do not exceed 200 mg per 24 hrs. 06/26/12   Kem Parkinson, PA-C    Physical Exam: Vitals:   05/16/16 1638 05/16/16 1734 05/16/16 1859 05/16/16 2133  BP: 116/66  129/77 117/60  Pulse: 89  99 88  Resp: 18  18 19   Temp: 97.4 F (36.3 C)   97.9 F (36.6 C)  TempSrc:    Oral  SpO2: 98% 95% 96% 98%  Weight: 103 kg (227 lb)     Height: 5\' 6"  (1.676 m)       Constitutional: NAD, calm, comfortable Vitals:   05/16/16 1638 05/16/16 1734 05/16/16 1859 05/16/16 2133  BP: 116/66  129/77 117/60  Pulse: 89  99 88  Resp: 18  18 19   Temp: 97.4 F (36.3 C)   97.9 F (36.6 C)  TempSrc:    Oral  SpO2: 98% 95% 96% 98%    Weight: 103 kg (227 lb)     Height: 5\' 6"  (1.676 m)      Eyes: PERRL, lids and conjunctivae normal ENMT: Mucous membranes are moist. Posterior pharynx clear of any exudate or lesions.Normal dentition.  Neck: normal, supple, no masses, no thyromegaly Respiratory: clear to auscultation bilaterally, no wheezing, no crackles. Normal respiratory effort. No accessory muscle use.  Cardiovascular: Regular rate and rhythm, no murmurs / rubs / gallops. No extremity edema. 2+ pedal pulses. No carotid bruits.  Abdomen: no tenderness, no masses palpated. No hepatosplenomegaly. Bowel sounds positive.  Musculoskeletal: no clubbing / cyanosis. No joint deformity upper and lower extremities. Good ROM, no contractures. Normal muscle tone.  Skin: no rashes, lesions, ulcers. No induration Neurologic: CN 2-12 grossly intact. Sensation intact, DTR normal. Strength 5/5 in all 4.  Psychiatric: Normal judgment and insight. Alert and oriented x 3. Normal mood.    Labs on Admission: I have personally reviewed following labs and imaging studies  CBC:  Recent Labs Lab 05/16/16 1744  WBC 8.7  NEUTROABS 3.5  HGB 13.7  HCT 39.5  MCV 93.2  PLT 123456   Basic Metabolic Panel:  Recent Labs Lab 05/16/16 1744  NA 136  K 4.1  CL 100*  CO2 24  GLUCOSE 348*  BUN 21*  CREATININE 1.10*  CALCIUM 9.4   GFR: Estimated Creatinine Clearance: 68.4 mL/min (by C-G formula based on SCr of 1.1 mg/dL (H)).   Radiological Exams on Admission: Dg Chest 2 View  Result Date: 05/16/2016 CLINICAL DATA:  Shortness of breath. EXAM: CHEST  2 VIEW COMPARISON:  12/06/2015 FINDINGS: The heart size and mediastinal contours are within normal limits. Both lungs are clear. The visualized skeletal structures are unremarkable. IMPRESSION: Normal chest. Electronically Signed   By: Lorriane Shire M.D.   On: 05/16/2016 17:13   Ct Angio Chest Pe W/cm &/or Wo Cm  Result Date: 05/16/2016 CLINICAL DATA:  Shortness of breath 2 weeks.  Chest  pressure. EXAM: CT ANGIOGRAPHY CHEST WITH CONTRAST TECHNIQUE: Multidetector CT imaging of the chest was performed using the standard protocol during bolus administration of intravenous contrast. Multiplanar CT image reconstructions and MIPs were obtained to evaluate the vascular anatomy. CONTRAST:  100 mL Isovue 370 COMPARISON:  None. FINDINGS: Cardiovascular: Suboptimal opacification of the pulmonary arteries to the segmental level limiting evaluation for pulmonary embolus. No large central pulmonary embolus. Normal heart size. No pericardial effusion. Normal caliber thoracic aorta without dissection. Mediastinum/Nodes: No enlarged mediastinal, hilar, or axillary lymph nodes. Thyroid gland, trachea, and esophagus demonstrate no significant findings. Lungs/Pleura: Small linear area of airspace disease peripherally in the right lower lobe likely reflecting mild scarring. No focal consolidation. No pleural effusion or pneumothorax. Upper Abdomen: No acute abnormality. Musculoskeletal: No acute osseous abnormality. No lytic or sclerotic osseous lesion. Review of the MIP images confirms the above findings. IMPRESSION: 1. Suboptimal opacification of the pulmonary arteries to the segmental level limiting evaluation for pulmonary embolus. No large central pulmonary embolus. 2. No thoracic aortic aneurysm or dissection. Electronically Signed   By: Kathreen Devoid   On: 05/16/2016 20:13    EKG: Independently reviewed. Nsr, prol qtc cxr reviewed no edema or infiltrate   Assessment/Plan 56 yo female DOE for 2 days along with chest pressure with lle swelling with h/o DM  Principal Problem:   SOB (shortness of breath)/chest pain-  Give pt sublingual NTG now on med surg floor, add telemetry monitoring to patient.   Check stat troponin level now.  Give aspirin now.  ekg reviewed from ED, no acute issues.  Obtain cardiac echocardiogram in the am.  Will need complete work up with stress testing if all work up neg here.  It  is more likely pt has underlying CAD than  PE with normal oxygen saturation and no tachycardia.    Active Problems:   Chronic pain syndrome- noted   Left leg swelling- ultrasound to r/o DVT, pt given a dose of xaralto in ED am not continuing  prolonged Qtc - noted, avoid prolonging agents   DVT prophylaxis:  xaralto given once in ED Code Status:  full Family Communication:   none  Disposition Plan:  Per day team Consults called:   none Admission status:   observation   Jacalynn Buzzell A MD [facility]  If 7PM-7AM, please contact night-coverage [email or web address] Password [email or web address]  05/16/2016, 10:12 PM

## 2016-05-16 NOTE — ED Notes (Signed)
Attempted to call report, floor will return call

## 2016-05-17 ENCOUNTER — Observation Stay (HOSPITAL_COMMUNITY): Payer: Medicaid Other

## 2016-05-17 DIAGNOSIS — R0600 Dyspnea, unspecified: Secondary | ICD-10-CM | POA: Diagnosis not present

## 2016-05-17 LAB — GLUCOSE, CAPILLARY
Glucose-Capillary: 573 mg/dL (ref 65–99)
Glucose-Capillary: 416 mg/dL — ABNORMAL HIGH (ref 65–99)
Glucose-Capillary: 485 mg/dL — ABNORMAL HIGH (ref 65–99)

## 2016-05-17 LAB — TROPONIN I
Troponin I: <0.03 ng/mL (ref <0.03)
Troponin I: <0.03 ng/mL (ref <0.03)

## 2016-05-17 MED ORDER — INSULIN ASPART 100 UNIT/ML ~~LOC~~ SOLN
0.0000 [IU] | Freq: Three times a day (TID) | SUBCUTANEOUS | Status: DC
  Administered 2016-05-17: 15 [IU] via SUBCUTANEOUS

## 2016-05-17 MED ORDER — INSULIN ASPART 100 UNIT/ML ~~LOC~~ SOLN: 0.0000 [IU] | Freq: Every day | SUBCUTANEOUS | Status: DC

## 2016-05-17 MED ORDER — INSULIN ASPART 100 UNIT/ML ~~LOC~~ SOLN
20.0000 [IU] | SUBCUTANEOUS | Status: AC
  Administered 2016-05-17: 20 [IU] via SUBCUTANEOUS

## 2016-05-17 MED ORDER — INSULIN ASPART 100 UNIT/ML ~~LOC~~ SOLN
4.0000 [IU] | Freq: Three times a day (TID) | SUBCUTANEOUS | Status: DC
  Administered 2016-05-17: 4 [IU] via SUBCUTANEOUS

## 2016-05-17 NOTE — Progress Notes (Signed)
Patient is to be discharged home and in stable condition. Patient verbalizes understanding of discharge instructions and follow-up appointments. Patient will be escorted out by staff.  Celestia Khat, RN

## 2016-05-17 NOTE — Discharge Summary (Signed)
Physician Discharge Summary  [name] [id number] DOB: 02/13/1961 DOA: 05/16/2016  PCP: Asencion Noble, MD  Admit date: 05/16/2016 Discharge date: 05/17/2016  Time spent: 45 minutes  Recommendations for Outpatient Follow-up:  -Will be discharged home today. -Advised to follow up with PCP in 2 weeks.   Discharge Diagnoses:  Principal Problem:   Dyspnea Active Problems:   Chronic pain syndrome   Left leg swelling   Diabetes mellitus ([place])   Discharge Condition: Stable and improved  Filed Weights   05/16/16 1638 05/16/16 2226  Weight: 103 kg (227 lb) 106.8 kg (235 lb 7.2 oz)    History of present illness:  As per Dr. Shanon Brow on 2/26: [name] is a 56 y.o. female with medical history significant of DM, anxiety, PTSD, HLD comes to the ED with 2 days of sob which is worse with exertion that has progressed to chest pressure sensation "tightness" across her chest for the last day or so that is also worse with activity.  Pt has been in ED with continued "chest pressure".  She denies any fevers.  No cough.  She does note some mild left leg swelling.  No pain in the leg.  She has no cardiac history did have a heart cath in 2005 which she was told she had "mild blockage" which did not require any stenting at the time.  She denies any recent traveling, no trauma, no recent surgeries.  In the ED, pt had a positive Ddimer which subsequently led to CTA of chest which was of poor quality and could not rule out PE outside of the large vessels.  Per Dr Eulis Foster report, it was requested for pt to be admitted for an ultrasound of her leg and VQ scan to r/o PE, no mention of any chest pressure or pain.  obs bed on med surg was then advised to be requested.  Hospital Course:   SOB/CP -Has ruled out for ACS with negative troponins and EKG without acute ischemic abnormalities. -LE doppler negative for DVT and no concrete PE on CT angio chest. -Symptoms have mostly resolved. -Because of her  DOE, have recommended OP with cardiology for consideration of stress testing.  Rest of chronic medical issues have been stable during this less 12 hour admission.  Procedures:  None   Consultations:  None  Discharge Instructions  Discharge Instructions    Diet - low sodium heart healthy    Complete by:  As directed    Increase activity slowly    Complete by:  As directed      Allergies as of 05/17/2016   No Known Allergies     Medication List    TAKE these medications   aspirin EC 81 MG tablet Take 1 tablet (81 mg total) by mouth daily.   canagliflozin 300 MG Tabs tablet Commonly known as:  INVOKANA Take 300 mg by mouth daily.   clonazePAM 0.5 MG tablet Commonly known as:  KLONOPIN Take 0.5 mg by mouth 3 (three) times daily.   exenatide 10 MCG/0.04ML Sopn injection Commonly known as:  BYETTA Inject 0.04 mLs (10 mcg total) into the skin 2 (two) times daily with a meal. Pt takes 10 mcg twice daily   gabapentin 800 MG tablet Commonly known as:  NEURONTIN Take 800 mg by mouth 4 (four) times daily.   glipiZIDE 5 MG 24 hr tablet Commonly known as:  GLUCOTROL XL Take 5 mg by mouth daily with breakfast.   insulin glargine 100 UNIT/ML injection  Commonly known as:  LANTUS Inject 55 Units into the skin at bedtime.   levothyroxine 150 MCG tablet Commonly known as:  SYNTHROID, LEVOTHROID Take 150 mcg by mouth daily before breakfast.   metFORMIN 500 MG tablet Commonly known as:  GLUCOPHAGE Take 2 tablets (1,000 mg total) by mouth 2 (two) times daily. Notes to patient:  Resume as usual   methocarbamol 500 MG tablet Commonly known as:  ROBAXIN Take 1 tablet (500 mg total) by mouth 4 (four) times daily. What changed:  how much to take  when to take this  reasons to take this   Oxycodone HCl 10 MG Tabs Take 10 mg by mouth 3 (three) times daily as needed (for pain).   QUEtiapine 100 MG tablet Commonly known as:  SEROQUEL Take 100 mg by mouth at bedtime.     simvastatin 40 MG tablet Commonly known as:  ZOCOR Take 1 tablet (40 mg total) by mouth daily. What changed:  when to take this   SUMAtriptan 100 MG tablet Commonly known as:  IMITREX One tablet po prn migraine.  May repeat in 2 hrs if needed.  Do not exceed 200 mg per 24 hrs. What changed:  how much to take  how to take this  when to take this  reasons to take this  additional instructions   topiramate 100 MG tablet Commonly known as:  TOPAMAX Take 1 tablet (100 mg total) by mouth at bedtime.   WELLBUTRIN SR 100 MG 12 hr tablet Generic drug:  buPROPion Take 100 mg by mouth daily.      No Known Allergies    The results of significant diagnostics from this hospitalization (including imaging, microbiology, ancillary and laboratory) are listed below for reference.    Significant Diagnostic Studies: Dg Chest 2 View  Result Date: 05/16/2016 CLINICAL DATA:  Shortness of breath. EXAM: CHEST  2 VIEW COMPARISON:  12/06/2015 FINDINGS: The heart size and mediastinal contours are within normal limits. Both lungs are clear. The visualized skeletal structures are unremarkable. IMPRESSION: Normal chest. Electronically Signed   By: Lorriane Shire M.D.   On: 05/16/2016 17:13   Ct Angio Chest Pe W/cm &/or Wo Cm  Result Date: 05/16/2016 CLINICAL DATA:  Shortness of breath 2 weeks.  Chest pressure. EXAM: CT ANGIOGRAPHY CHEST WITH CONTRAST TECHNIQUE: Multidetector CT imaging of the chest was performed using the standard protocol during bolus administration of intravenous contrast. Multiplanar CT image reconstructions and MIPs were obtained to evaluate the vascular anatomy. CONTRAST:  100 mL Isovue 370 COMPARISON:  None. FINDINGS: Cardiovascular: Suboptimal opacification of the pulmonary arteries to the segmental level limiting evaluation for pulmonary embolus. No large central pulmonary embolus. Normal heart size. No pericardial effusion. Normal caliber thoracic aorta without dissection.  Mediastinum/Nodes: No enlarged mediastinal, hilar, or axillary lymph nodes. Thyroid gland, trachea, and esophagus demonstrate no significant findings. Lungs/Pleura: Small linear area of airspace disease peripherally in the right lower lobe likely reflecting mild scarring. No focal consolidation. No pleural effusion or pneumothorax. Upper Abdomen: No acute abnormality. Musculoskeletal: No acute osseous abnormality. No lytic or sclerotic osseous lesion. Review of the MIP images confirms the above findings. IMPRESSION: 1. Suboptimal opacification of the pulmonary arteries to the segmental level limiting evaluation for pulmonary embolus. No large central pulmonary embolus. 2. No thoracic aortic aneurysm or dissection. Electronically Signed   By: Kathreen Devoid   On: 05/16/2016 20:13   US Venous Img Lower Unilateral Left  Result Date: 05/17/2016 CLINICAL DATA:  LEFT leg edema for  1 month. EXAM: LEFT LOWER EXTREMITY VENOUS DOPPLER ULTRASOUND TECHNIQUE: Gray-scale sonography with graded compression, as well as color Doppler and duplex ultrasound were performed to evaluate the lower extremity deep venous systems from the level of the common femoral vein and including the common femoral, femoral, profunda femoral, popliteal and calf veins including the posterior tibial, peroneal and gastrocnemius veins when visible. The superficial great saphenous vein was also interrogated. Spectral Doppler was utilized to evaluate flow at rest and with distal augmentation maneuvers in the common femoral, femoral and popliteal veins. COMPARISON:  None. FINDINGS: Contralateral Common Femoral Vein: Respiratory phasicity is normal and symmetric with the symptomatic side. No evidence of thrombus. Normal compressibility. Common Femoral Vein: No evidence of thrombus. Normal compressibility, respiratory phasicity and response to augmentation. Saphenofemoral Junction: No evidence of thrombus. Normal compressibility and flow on color Doppler  imaging. Profunda Femoral Vein: No evidence of thrombus. Normal compressibility and flow on color Doppler imaging. Femoral Vein: No evidence of thrombus. Normal compressibility, respiratory phasicity and response to augmentation. Popliteal Vein: No evidence of thrombus. Normal compressibility, respiratory phasicity and response to augmentation. Calf Veins: No evidence of thrombus. Normal compressibility and flow on color Doppler imaging. Superficial Great Saphenous Vein: No evidence of thrombus. Normal compressibility and flow on color Doppler imaging. Venous Reflux:  None. Other Findings:  None. IMPRESSION: No evidence of LEFT lower extremity deep venous thrombosis. Electronically Signed   By: Staci Righter M.D.   On: 05/17/2016 08:43    Microbiology: No results found for this or any previous visit (from the past 240 hour(s)).   Labs: Basic Metabolic Panel:  Recent Labs Lab 05/16/16 1744  NA 136  K 4.1  CL 100*  CO2 24  GLUCOSE 348*  BUN 21*  CREATININE 1.10*  CALCIUM 9.4   Liver Function Tests: No results for input(s): AST, ALT, ALKPHOS, BILITOT, PROT, ALBUMIN in the last 168 hours. No results for input(s): LIPASE, AMYLASE in the last 168 hours. No results for input(s): AMMONIA in the last 168 hours. CBC:  Recent Labs Lab 05/16/16 1744  WBC 8.7  NEUTROABS 3.5  HGB 13.7  HCT 39.5  MCV 93.2  PLT 260   Cardiac Enzymes:  Recent Labs Lab 05/16/16 2258 05/17/16 0444 05/17/16 1029  TROPONINI <0.03 <0.03 <0.03   BNP: BNP (last 3 results)  Recent Labs  05/16/16 1744  BNP 11.0    ProBNP (last 3 results) No results for input(s): PROBNP in the last 8760 hours.  CBG:  Recent Labs Lab 05/17/16 0904 05/17/16 1052 05/17/16 1125  GLUCAP 573* 485* 416*       Signed:  HERNANDEZ ACOSTA,ESTELA  [facility] Pager: [phone] 05/17/2016, 2:59 PM

## 2016-05-17 NOTE — Progress Notes (Signed)
Pt complained of chest pressure rated 6/10 on pain scale. Administered 3 nitroglycerin SL tablets and 324 mg Aspirin. Pt's pain came down to a 4/10. She stated that the chest pressure has been going on for about 3 weeks now. It hurts more when she breathes heavily. Troponin <0.03. VS WNL. Made MD Dr. Shanon Brow aware. No new orders given at this time. Will continue to monitor.

## 2016-05-17 NOTE — Progress Notes (Signed)
Inpatient Diabetes Program Recommendations  AACE/ADA: New Consensus Statement on Inpatient Glycemic Control (2015)  Target Ranges:  Prepandial:   less than 140 mg/dL      Peak postprandial:   less than 180 mg/dL (1-2 hours)      Critically ill patients:  140 - 180 mg/dL   Results for FRANCE, POWE (MRN [id number]) as of 05/17/2016 09:14  Ref. Range 05/17/2016 09:04  Glucose-Capillary Latest Ref Range: 65 - 99 mg/dL 573 [facility])  Results for CHRISSI, FACENDA (MRN [id number]) as of 05/17/2016 09:14  Ref. Range 05/16/2016 17:44  Glucose Latest Ref Range: 65 - 99 mg/dL 348 (H)   Review of Glycemic Control  Diabetes history: DM2 Outpatient Diabetes medications: Lantus 55 units QHS, Invokana 300 mg daily, Byetta 10 mcg BID, Metformin 1000 mg BID, Glipizide XL 5 mb QAM Current orders for Inpatient glycemic control: Lantus 55 units QHS, Novolog 0-9 units TID with meals  Inpatient Diabetes Program Recommendations: Insulin - Basal: Please consider increasing Lantus to 65 units QHS. Correction (SSI): Please consider increasing Novolog correction to Resistant scale and adding Novolog bedtime correction scale. Insulin - Meal Coverage: Please consider ordering Novolog 10 units TID with meals for meal coverage if patient eats at least 50% of meals. HgbA1C: Please add on an A1C to blood in lab to evaluate glycemic control over the past 2-3 months.  Thanks, Barnie Alderman, RN, MSN, CDE Diabetes Coordinator Inpatient Diabetes Program [phone] (Team Pager from 8am to 5pm)

## 2016-05-17 NOTE — Progress Notes (Signed)
Patient's blood sugar was 573. MD notified.

## 2016-05-24 ENCOUNTER — Telehealth: Payer: Self-pay | Admitting: Orthopedic Surgery

## 2016-05-24 NOTE — Telephone Encounter (Signed)
Patient has appt for Tuesday March 13th for the R knee, but is asking for advice of what to do until seen.  Please call and advise.  She has been ice and taking ibuprofen 800mg  and has also been taking the oxycodone (for her back).

## 2016-05-25 ENCOUNTER — Encounter (HOSPITAL_COMMUNITY): Payer: Self-pay | Admitting: *Deleted

## 2016-05-25 ENCOUNTER — Emergency Department (HOSPITAL_COMMUNITY): Payer: Medicaid Other

## 2016-05-25 ENCOUNTER — Emergency Department (HOSPITAL_COMMUNITY)
Admission: EM | Admit: 2016-05-25 | Discharge: 2016-05-25 | Disposition: A | Discharge status: Home Health | Payer: Medicaid Other | Attending: Emergency Medicine | Admitting: Emergency Medicine

## 2016-05-25 DIAGNOSIS — Y939 Activity, unspecified: Secondary | ICD-10-CM | POA: Insufficient documentation

## 2016-05-25 DIAGNOSIS — Y929 Unspecified place or not applicable: Secondary | ICD-10-CM | POA: Diagnosis not present

## 2016-05-25 DIAGNOSIS — Z79899 Other long term (current) drug therapy: Secondary | ICD-10-CM | POA: Diagnosis not present

## 2016-05-25 DIAGNOSIS — Z794 Long term (current) use of insulin: Secondary | ICD-10-CM | POA: Diagnosis not present

## 2016-05-25 DIAGNOSIS — Z7982 Long term (current) use of aspirin: Secondary | ICD-10-CM | POA: Diagnosis not present

## 2016-05-25 DIAGNOSIS — M1711 Unilateral primary osteoarthritis, right knee: Secondary | ICD-10-CM | POA: Diagnosis not present

## 2016-05-25 DIAGNOSIS — S8991XA Unspecified injury of right lower leg, initial encounter: Secondary | ICD-10-CM | POA: Diagnosis present

## 2016-05-25 DIAGNOSIS — Y999 Unspecified external cause status: Secondary | ICD-10-CM | POA: Insufficient documentation

## 2016-05-25 DIAGNOSIS — W1839XA Other fall on same level, initial encounter: Secondary | ICD-10-CM | POA: Insufficient documentation

## 2016-05-25 DIAGNOSIS — F17210 Nicotine dependence, cigarettes, uncomplicated: Secondary | ICD-10-CM | POA: Diagnosis not present

## 2016-05-25 DIAGNOSIS — E119 Type 2 diabetes mellitus without complications: Secondary | ICD-10-CM | POA: Insufficient documentation

## 2016-05-25 DIAGNOSIS — S8391XA Sprain of unspecified site of right knee, initial encounter: Secondary | ICD-10-CM | POA: Diagnosis not present

## 2016-05-25 MED ORDER — IBUPROFEN 800 MG PO TABS
800.0000 mg | ORAL_TABLET | Freq: Once | ORAL | Status: AC
  Administered 2016-05-25: 800 mg via ORAL
  Filled 2016-05-25: qty 1

## 2016-05-25 MED ORDER — METHOCARBAMOL 500 MG PO TABS
500.0000 mg | ORAL_TABLET | Freq: Once | ORAL | Status: AC
  Administered 2016-05-25: 500 mg via ORAL
  Filled 2016-05-25: qty 1

## 2016-05-25 MED ORDER — ONDANSETRON HCL 4 MG PO TABS
4.0000 mg | ORAL_TABLET | Freq: Once | ORAL | Status: AC
  Administered 2016-05-25: 4 mg via ORAL
  Filled 2016-05-25: qty 1

## 2016-05-25 MED ORDER — OXYCODONE-ACETAMINOPHEN 5-325 MG PO TABS
1.0000 | ORAL_TABLET | Freq: Once | ORAL | Status: AC
  Administered 2016-05-25: 1 via ORAL
  Filled 2016-05-25: qty 1

## 2016-05-25 NOTE — ED Triage Notes (Signed)
Pt c/o right knee pain after fall yesterday. Pt has hx of knee pain with torn meniscus. Pt has been using cold compress, eleavation, Ibuprofen 800mg  and Oxycodone without relief. Pt ambulatory in triage with cane.

## 2016-05-25 NOTE — ED Provider Notes (Signed)
[facility] DEPT Provider Note   CSN: [id number] Arrival date & time: 05/25/16  1505     History   Chief Complaint Chief Complaint  Patient presents with  . Knee Pain    HPI [place] H Abeln is a 56 y.o. female.  Patient is a 56 year old female who presents to the emergency department with a complaint of right knee pain.  The patient states she has extensive history of surgery and problems with the right knee. She walks with a cane, because the knee frequently gives out. On yesterday on March 6, the patient states that she fell while shopping, she did a semi-splint, and injured the knee. She applied ice, and elevated the knee. She used ibuprofen, but she states the pain continues to be severe. She was concerned because she has had a torn meniscus in the past and could not see Dr. Aline Brochure for nearly a week. She wanted to come to the emergency department to have x-rays and to be evaluated. The pain is aggravated when she bends or applies weight. There is some relief when she leaves the leg completely straight, but this does not completely resolve the issue.      Past Medical History:  Diagnosis Date  . Anxiety   . Bipolar disorder ([place])   . Depression   . Diabetes mellitus   . Elevated cholesterol   . Fibromyalgia   . GERD (gastroesophageal reflux disease)   . Migraine   . PTSD (post-traumatic stress disorder)    after death of son (accidental overdose)  . Thyroid disease     Patient Active Problem List   Diagnosis Date Noted  . Dyspnea 05/16/2016  . Left leg swelling 05/16/2016  . Diabetes mellitus ([place]) 05/16/2016  . Chest pain   . Swelling   . Acute medial meniscus tear of left knee   . Post traumatic stress disorder (PTSD) 11/08/2014  . MDD (major depressive disorder), recurrent severe, without psychosis ([place]) 11/08/2014  . 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 Panic disorder 11/08/2014  . Chronic pain syndrome  11/21/2013  . Primary osteoarthritis of both knees 11/21/2013  . Spinal stenosis of lumbar region 11/21/2013  . Difficulty in walking(719.7) 10/14/2013  . Sciatica associated with disorder of lumbar spine 09/17/2013  . Lower back pain 08/08/2013  . Lateral meniscus, posterior horn derangement 06/13/2013  . Arthritis of right knee 06/13/2013  . S/P medial meniscectomy of right knee 06/13/2013  . Left shoulder pain 08/28/2012  . Rotator cuff syndrome of left shoulder 08/28/2012  . Rotator cuff tear 08/28/2012  . Pes anserinus bursitis 05/16/2012  . Encounter for screening colonoscopy 04/16/2012  . Fatty liver 04/16/2012  . Back pain 07/26/2011  . Knee bursitis, left 07/26/2011  . OA (osteoarthritis) of knee 07/26/2011  . Spinal stenosis 10/28/2010  . Medial meniscus, posterior horn derangement 08/12/2010  . Knee pain 08/04/2010  . Sciatica 08/04/2010  . H N P-LUMBAR 01/14/2010  . 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 LUMBOSACRAL STRAIN 05/12/2009  . DERANGEMENT OF POSTERIOR HORN OF MEDIAL MENISCUS 01/19/2009  . BACK PAIN 01/19/2009  . LOWER LEG, ARTHRITIS, DEGEN./OSTEO 12/02/2008  . JOINT EFFUSION, KNEE 11/10/2008  . ANSERINE BURSITIS, RIGHT 11/10/2008  . DERANGEMENT MENISCUS 10/15/2008  . KNEE PAIN 10/15/2008    Past Surgical History:  Procedure Laterality Date  . CARPAL TUNNEL RELEASE  bilateral  . CHOLECYSTECTOMY    . COLONOSCOPY WITH PROPOFOL N/A 05/01/2012   OMA:YOKHT COLON polyps/Mild diverticulosis was noted in the sigmoid colon/Moderate  sized internal hemorrhoids. path with tubular adenoma  . gallbladder    . KNEE ARTHROSCOPY     bilateral  . KNEE ARTHROSCOPY WITH MEDIAL MENISECTOMY Right 10/29/2015   Procedure: ARTHROSCOPY RIGHT KNEE  WITH PARTIAL MEDIAL MENISECTOMY;  Surgeon: Carole Civil, MD;  Location: [facility];  Service: Orthopedics;  Laterality: Right;  . POLYPECTOMY N/A 05/01/2012   Procedure: POLYPECTOMY;  Surgeon: Danie Binder, MD;  Location: [facility];   Service: Endoscopy;  Laterality: N/A;  . SHOULDER SURGERY  left  . VAGINAL HYSTERECTOMY      OB History    No data available       Home Medications    Prior to Admission medications   Medication Sig Start Date End Date Taking? Authorizing Provider  aspirin EC 81 MG tablet Take 1 tablet (81 mg total) by mouth daily. 11/10/14   Niel Hummer, NP  buPROPion [facility] SR) 100 MG 12 hr tablet Take 100 mg by mouth daily.    Historical Provider, MD  canagliflozin (INVOKANA) 300 MG TABS tablet Take 300 mg by mouth daily. 11/10/14   Niel Hummer, NP  clonazePAM (KLONOPIN) 0.5 MG tablet Take 0.5 mg by mouth 3 (three) times daily.    Historical Provider, MD  exenatide (BYETTA) 10 MCG/0.04ML SOPN injection Inject 0.04 mLs (10 mcg total) into the skin 2 (two) times daily with a meal. Pt takes 10 mcg twice daily 11/10/14   Niel Hummer, NP  gabapentin (NEURONTIN) 800 MG tablet Take 800 mg by mouth 4 (four) times daily.     Historical Provider, MD  glipiZIDE (GLUCOTROL XL) 5 MG 24 hr tablet Take 5 mg by mouth daily with breakfast.    Historical Provider, MD  insulin glargine (LANTUS) 100 UNIT/ML injection Inject 55 Units into the skin at bedtime.     Historical Provider, MD  levothyroxine (SYNTHROID, LEVOTHROID) 150 MCG tablet Take 150 mcg by mouth daily before breakfast.    Historical Provider, MD  metFORMIN (GLUCOPHAGE) 500 MG tablet Take 2 tablets (1,000 mg total) by mouth 2 (two) times daily. 11/10/14   Niel Hummer, NP  methocarbamol (ROBAXIN) 500 MG tablet Take 1 tablet (500 mg total) by mouth 4 (four) times daily. Patient taking differently: Take 500-1,000 mg by mouth daily as needed for muscle spasms.  10/07/15   Carole Civil, MD  Oxycodone HCl 10 MG TABS Take 10 mg by mouth 3 (three) times daily as needed (for pain).     Historical Provider, MD  QUEtiapine (SEROQUEL) 100 MG tablet Take 100 mg by mouth at bedtime.    Historical Provider, MD  simvastatin (ZOCOR) 40 MG tablet Take 1 tablet  (40 mg total) by mouth daily. Patient taking differently: Take 40 mg by mouth daily at 6 PM.  11/10/14   Niel Hummer, NP  SUMAtriptan (IMITREX) 100 MG tablet One tablet po prn migraine.  May repeat in 2 hrs if needed.  Do not exceed 200 mg per 24 hrs. Patient taking differently: Take 100 mg by mouth every 2 (two) hours as needed for migraine or headache. May repeat in 2 hrs if needed.  Do not exceed 200 mg per 24 hrs. 06/26/12   Tammy Triplett, PA-C  topiramate (TOPAMAX) 100 MG tablet Take 1 tablet (100 mg total) by mouth at bedtime. 11/10/14   Niel Hummer, NP    Family History Family History  Problem Relation Age of Onset  . Cancer    . Diabetes    .  Heart disease    . Arthritis    . Stroke Mother   . Anxiety disorder Mother   . Cancer - Lung Father   . Lung disease    . Colon cancer Neg Hx     Social History Social History  Substance Use Topics  . Smoking status: Current Every Day Smoker    Packs/day: 2.00    Years: 3.00    Types: Cigarettes  . Smokeless tobacco: Never Used  . Alcohol use No     Allergies   Patient has no known allergies.   Review of Systems Review of Systems  Constitutional: Negative for activity change.       All ROS Neg except as noted in HPI  HENT: Negative for nosebleeds.   Eyes: Negative for photophobia and discharge.  Respiratory: Negative for cough, shortness of breath and wheezing.   Cardiovascular: Negative for chest pain and palpitations.  Gastrointestinal: Negative for abdominal pain and blood in stool.  Genitourinary: Negative for dysuria, frequency and hematuria.  Musculoskeletal: Positive for arthralgias and back pain. Negative for neck pain.  Skin: Negative.   Neurological: Negative for dizziness, seizures and speech difficulty.  Psychiatric/Behavioral: Negative for confusion and hallucinations.     Physical Exam Updated Vital Signs BP 118/87   Pulse 90   Temp 98.7 F (37.1 C) (Oral)   Resp 16   Ht 5\' 6"  (1.676 m)   Wt  103 kg   SpO2 95%   BMI 36.64 kg/m   Physical Exam  Constitutional: She is oriented to person, place, and time. She appears well-developed and well-nourished.  Non-toxic appearance.  HENT:  Head: Normocephalic.  Right Ear: Tympanic membrane and external ear normal.  Left Ear: Tympanic membrane and external ear normal.  Eyes: EOM and lids are normal. Pupils are equal, round, and reactive to light.  Neck: Normal range of motion. Neck supple. Carotid bruit is not present.  Cardiovascular: Normal rate, regular rhythm, normal heart sounds, intact distal pulses and normal pulses.   Pulmonary/Chest: Breath sounds normal. No respiratory distress.  Abdominal: Soft. Bowel sounds are normal. There is no tenderness. There is no guarding.  Musculoskeletal: Normal range of motion.  There is soreness of the right hip, but no evidence of any dislocation or hematoma in the area. The patient states that she has degenerative changes, as well as sciatica that bothers this right hip. There is no deformity of the quadricep area. There's no deformity of the anterior tibial tuberosity. There no posterior mass appreciated of the right knee. There is no effusion appreciated. The patella is in the midline. There is no deformity of the anterior tibial area extending down to the ankle. The Achilles tendon is intact. The dorsalis pedis pulses 2+. There is pain with attempted movement or range of motion involving the right knee.  Lymphadenopathy:       Head (right side): No submandibular adenopathy present.       Head (left side): No submandibular adenopathy present.    She has no cervical adenopathy.  Neurological: She is alert and oriented to person, place, and time. She has normal strength. No cranial nerve deficit or sensory deficit.  Skin: Skin is warm and dry.  Psychiatric: She has a normal mood and affect. Her speech is normal.  Nursing note and vitals reviewed.    ED Treatments / Results  Labs (all labs  ordered are listed, but only abnormal results are displayed) Labs Reviewed - No data to display  EKG  EKG Interpretation None       Radiology Dg Knee Complete 4 Views Right  Result Date: 05/25/2016 CLINICAL DATA:  Knee pain.  Fall yesterday. EXAM: RIGHT KNEE - COMPLETE 4+ VIEW COMPARISON:  MRI 10/14/2015 FINDINGS: Mild degenerative changes with joint space narrowing and spurring, most pronounced in the medial compartment. No acute bony abnormality. Specifically, no fracture, subluxation, or dislocation. Soft tissues are intact. No joint effusion. IMPRESSION: Mild degenerative changes.  No acute bony abnormality. Electronically Signed   By: Rolm Baptise M.D.   On: 05/25/2016 15:47    Procedures Procedures (including critical care time)  Medications Ordered in ED Medications - No data to display   Initial Impression / Assessment and Plan / ED Course  I have reviewed the triage vital signs and the nursing notes.  Pertinent labs & imaging results that were available during my care of the patient were reviewed by me and considered in my medical decision making (see chart for details).     **I have reviewed nursing notes, vital signs, and all appropriate lab and imaging results for this patient.*  Final Clinical Impressions(s) / ED Diagnoses  MDM The patient sustained a fall from a standing position on yesterday. She continues to have knee pain. The x-ray of the right knee shows degenerative changes, with spurs and some joint narrowing. There is no fracture, subluxation, or joint effusion noted. There is pain with attempted movement. Suspect the patient has a knee sprain, complicated by previous surgeries and degenerative changes. I've asked patient to see Dr. Aline Brochure for orthopedic evaluation and management. The patient will be fitted with a knee immobilizer. She has a cane to use. She has pain medication and muscle relaxer medication to use until seen by Dr. Aline Brochure.    Final  diagnoses:  None    New Prescriptions New Prescriptions   No medications on file     Lily Kocher, PA-C 05/25/16 1640    Julianne Rice, MD 05/26/16 [phone]

## 2016-05-25 NOTE — Discharge Instructions (Signed)
Your x-rays negative for fracture, dislocation, or fluid in the joint on. Your x-ray does confirm multiple areas of arthritis. I suspect that you have a sprain of the right knee. Please use the knee immobilizer when up and about. Apply ice and keep your knee elevated above your waist is much as possible. Use your  cane until seen by Dr. Aline Brochure. You do not need to sleep in this device. Continue your current medications, including the Robaxin for muscle strain.

## 2016-05-31 ENCOUNTER — Ambulatory Visit: Payer: Medicaid Other | Admitting: Orthopedic Surgery

## 2016-05-31 NOTE — Telephone Encounter (Signed)
Will address at time of office visit 

## 2016-06-01 ENCOUNTER — Ambulatory Visit (INDEPENDENT_AMBULATORY_CARE_PROVIDER_SITE_OTHER): Payer: Medicaid Other | Admitting: Physician Assistant

## 2016-06-01 ENCOUNTER — Encounter: Payer: Self-pay | Admitting: *Deleted

## 2016-06-01 ENCOUNTER — Encounter: Payer: Self-pay | Admitting: Physician Assistant

## 2016-06-01 VITALS — BP 138/78 | HR 97 | Ht 66.0 in | Wt 229.0 lb

## 2016-06-01 DIAGNOSIS — Z720 Tobacco use: Secondary | ICD-10-CM | POA: Diagnosis not present

## 2016-06-01 DIAGNOSIS — R079 Chest pain, unspecified: Secondary | ICD-10-CM

## 2016-06-01 DIAGNOSIS — I5181 Takotsubo syndrome: Secondary | ICD-10-CM | POA: Diagnosis not present

## 2016-06-01 DIAGNOSIS — E785 Hyperlipidemia, unspecified: Secondary | ICD-10-CM | POA: Insufficient documentation

## 2016-06-01 DIAGNOSIS — R0609 Other forms of dyspnea: Secondary | ICD-10-CM | POA: Diagnosis not present

## 2016-06-01 DIAGNOSIS — Z794 Long term (current) use of insulin: Secondary | ICD-10-CM | POA: Diagnosis not present

## 2016-06-01 DIAGNOSIS — R002 Palpitations: Secondary | ICD-10-CM | POA: Diagnosis not present

## 2016-06-01 DIAGNOSIS — E118 Type 2 diabetes mellitus with unspecified complications: Secondary | ICD-10-CM | POA: Diagnosis not present

## 2016-06-01 NOTE — Progress Notes (Signed)
Cardiology Office Note    Date:  06/01/2016   ID:  Tonya, Hawkins 1961/02/12, MRN [id number]  PCP:  Asencion Noble, MD  Cardiologist: New  Chief Complaint  Patient presents with  . Chest Pain    History of Present Illness:  [name] is a 56 y.o. female With history of these mellitus, anxiety, PTSD, HLD was admitted to the hospital 04/2016 with chest pain.  She had a positive d-dimer that led to a CTA which was poor quality and could not rule out PE outside the large vessels. Lower extremity Dopplers were negative for DVT. Troponins were negative and EKG without acute change.  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 She had a cardiac catheterization and had a minor blockage but did not require stenting. She was off-and-on meds but then was lost to follow-up.  Patient describes chest pain on admission as someone sitting on her chest associated with shortness of breath. She has had no further chest pain. She does have chronic dyspnea on exertion but thinks she needs a pulmonologist. She also has palpitations associated with dizziness that occur about once a week. Increased fatigue. She has been smoking off and on since 2005 but is down to 2 cigarettes and trying to quit. Her diabetes is out of control. Her sugars have been running 400-600. She can't exercise because of significant arthritis. She has no history of hypertension, takes Crestor for HLD. Maternal grandmother had an MI in her 45s and a pacemaker, mother and sister with CHF but no CAD      Past Medical History:  Diagnosis Date  . Anxiety   . Bipolar disorder ([place])   . Depression   . Diabetes mellitus   . Elevated cholesterol   . Fibromyalgia   . GERD (gastroesophageal reflux disease)   . Migraine   . PTSD (post-traumatic stress disorder)    after death of son (accidental overdose)  . Thyroid disease     Past Surgical History:  Procedure Laterality  Date  . CARPAL TUNNEL RELEASE  bilateral  . CHOLECYSTECTOMY    . COLONOSCOPY WITH PROPOFOL N/A 05/01/2012   DSK:AJGOT COLON polyps/Mild diverticulosis was noted in the sigmoid colon/Moderate sized internal hemorrhoids. path with tubular adenoma  . gallbladder    . KNEE ARTHROSCOPY     bilateral  . KNEE ARTHROSCOPY WITH MEDIAL MENISECTOMY Right 10/29/2015   Procedure: ARTHROSCOPY RIGHT KNEE  WITH PARTIAL MEDIAL MENISECTOMY;  Surgeon: Carole Civil, MD;  Location: [facility];  Service: Orthopedics;  Laterality: Right;  . POLYPECTOMY N/A 05/01/2012   Procedure: POLYPECTOMY;  Surgeon: Danie Binder, MD;  Location: [facility];  Service: Endoscopy;  Laterality: N/A;  . SHOULDER SURGERY  left  . VAGINAL HYSTERECTOMY      Current Medications: Outpatient Medications Prior to Visit  Medication Sig Dispense Refill  . aspirin EC 81 MG tablet Take 1 tablet (81 mg total) by mouth daily.    Marland Kitchen buPROPion (WELLBUTRIN SR) 100 MG 12 hr tablet Take 100 mg by mouth daily.    . canagliflozin (INVOKANA) 300 MG TABS tablet Take 300 mg by mouth daily. 30 tablet   . clonazePAM (KLONOPIN) 0.5 MG tablet Take 0.5 mg by mouth 3 (three) times daily.    Marland Kitchen exenatide (BYETTA) 10 MCG/0.04ML SOPN injection Inject 0.04 mLs (10 mcg total) into the skin 2 (two) times daily with a meal. Pt takes 10 mcg twice daily 2.4 mL   .  gabapentin (NEURONTIN) 800 MG tablet Take 800 mg by mouth 4 (four) times daily.     Marland Kitchen glipiZIDE (GLUCOTROL XL) 5 MG 24 hr tablet Take 5 mg by mouth daily with breakfast.    . insulin glargine (LANTUS) 100 UNIT/ML injection Inject 55 Units into the skin at bedtime.     Marland Kitchen levothyroxine (SYNTHROID, LEVOTHROID) 150 MCG tablet Take 150 mcg by mouth daily before breakfast.    . metFORMIN (GLUCOPHAGE) 500 MG tablet Take 2 tablets (1,000 mg total) by mouth 2 (two) times daily.    . methocarbamol (ROBAXIN) 500 MG tablet Take 1 tablet (500 mg total) by mouth 4 (four) times daily. (Patient taking differently: Take  500-1,000 mg by mouth daily as needed for muscle spasms. ) 90 tablet 5  . Oxycodone HCl 10 MG TABS Take 10 mg by mouth 3 (three) times daily as needed (for pain).     . QUEtiapine (SEROQUEL) 100 MG tablet Take 100 mg by mouth at bedtime.    . simvastatin (ZOCOR) 40 MG tablet Take 1 tablet (40 mg total) by mouth daily. (Patient taking differently: Take 40 mg by mouth daily at 6 PM. ) 30 tablet   . SUMAtriptan (IMITREX) 100 MG tablet One tablet po prn migraine.  May repeat in 2 hrs if needed.  Do not exceed 200 mg per 24 hrs. (Patient taking differently: Take 100 mg by mouth every 2 (two) hours as needed for migraine or headache. May repeat in 2 hrs if needed.  Do not exceed 200 mg per 24 hrs.) 5 tablet 0  . topiramate (TOPAMAX) 100 MG tablet Take 1 tablet (100 mg total) by mouth at bedtime.     No facility-administered medications prior to visit.      Allergies:   Patient has no known allergies.   Social History   Social History  . Marital status: Single    Spouse name: N/A  . Number of children: N/A  . Years of education: N/A   Occupational History  . unemployed Unemployed   Social History Main Topics  . Smoking status: Current Every Day Smoker    Packs/day: 0.25    Years: 3.00    Types: Cigarettes  . Smokeless tobacco: Never Used  . Alcohol use No  . Drug use: No  . Sexual activity: Yes    Birth control/ protection: Surgical   Other Topics Concern  . None   Social History Narrative  . None     Family History:  The patient's family history includes Anxiety disorder in her mother; Cancer - Lung in her father; Heart disease in her maternal grandfather; Heart failure in her mother and sister; Lung disease in her father; Stroke in her mother and paternal grandmother.   ROS:   Please see the history of present illness.    Review of Systems  Constitution: Negative.  HENT: Negative.   Eyes: Negative.   Cardiovascular: Positive for dyspnea on exertion, leg swelling and  palpitations.  Respiratory: Positive for shortness of breath.   Hematologic/Lymphatic: Negative.   Musculoskeletal: Positive for arthritis, back pain, joint pain, joint swelling, myalgias and stiffness.  Gastrointestinal: Negative.   Genitourinary: Negative.   Neurological: Positive for dizziness.   All other systems reviewed and are negative.   PHYSICAL EXAM:   VS:  BP 138/78   Pulse 97   Ht 5\' 6"  (1.676 m)   Wt 229 lb (103.9 kg)   SpO2 94%   BMI 36.96 kg/m   Physical Exam  GEN: Obese, in no acute distress  Neck: no JVD, carotid bruits, or masses Cardiac:RRR; S4, no murmurs, rubs, or gallops  Respiratory:  clear to auscultation bilaterally, normal work of breathing GI: soft, nontender, nondistended, + BS Ext: without cyanosis, clubbing, or edema, Good distal pulses bilaterally Psych: euthymic mood, full affect  Wt Readings from Last 3 Encounters:  06/01/16 229 lb (103.9 kg)  05/25/16 227 lb (103 kg)  05/16/16 235 lb 7.2 oz (106.8 kg)      Studies/Labs Reviewed:   EKG:  EKG is not ordered today.  EKG reviewed from 05/17/16 normal sinus rhythm, normal EKG  Recent Labs: 12/06/2015: ALT 49 05/16/2016: B Natriuretic Peptide 11.0; BUN 21; Creatinine, Ser 1.10; Hemoglobin 13.7; Platelets 260; Potassium 4.1; Sodium 136   Lipid Panel No results found for: CHOL, TRIG, HDL, CHOLHDL, VLDL, LDLCALC, LDLDIRECT  Additional studies/ records that were reviewed today include:      ASSESSMENT:    1. Chest pain, unspecified type   2. Dyspnea on exertion   3. Stress-induced cardiomyopathy   4. Palpitations   5. Type 2 diabetes mellitus with complication, with long-term current use of insulin ([place])   6. Hyperlipidemia, unspecified hyperlipidemia type   7. Tobacco abuse      PLAN:  In order of problems listed above:  Chest pain and dyspnea on exertion with multiple cardiac risk factors for CAD including HLD, DM, smoker, family history. Discussed with Dr. branch who concurs  that we will proceed with Lexi scan Myoview. Check 2-D echo to rule out structural heart disease especially with a history of stress-induced cardiomyopathy. F/u with Dr. Harl Bowie in 1 month.Obtain records from [facility].  Stress-induced cardiomyopathy in 2003-06-19 after the death of her son cardiac catheterization per patient mild blockage treated medically  Palpitations with dizziness occurring once a week will place the event recorder   Diabetes mellitus uncontrolled with blood sugars running consistently over 400. Stressed importance of good diabetic control  Hyperlipidemia managed by primary care on Zocor  Tobacco abuse smoking cessation discussed     Medication Adjustments/Labs and Tests Ordered: Current medicines are reviewed at length with the patient today.  Concerns regarding medicines are outlined above.  Medication changes, Labs and Tests ordered today are listed in the Patient Instructions below. Patient Instructions  Your physician recommends that you continue on your current medications as directed. Please refer to the Current Medication list given to you today.  Your physician has requested that you have a lexiscan myoview. For further information please visit [email or web address]. Please follow instruction sheet, as given.  Your physician has requested that you have an echocardiogram. Echocardiography is a painless test that uses sound waves to create images of your heart. It provides your doctor with information about the size and shape of your heart and how well your heart's chambers and valves are working. This procedure takes approximately one hour. There are no restrictions for this procedure.  Your physician has recommended that you wear an event monitor. Event monitors are medical devices that record the heart's electrical activity. Doctors most often [place] these monitors to diagnose arrhythmias. Arrhythmias are problems with the speed or rhythm of the heartbeat. The monitor  is a small, portable device. You can wear one while you do your normal daily activities. This is usually used to diagnose what is causing palpitations/syncope (passing out).  If you need a refill on your cardiac medications before your next appointment, please call your pharmacy.  Thank you for choosing [facility]!  Sumner Boast, PA-C  06/01/2016 2:03 PM    [facility] [place], [place], [place]  [address] Phone: [phone]; Fax: [phone]

## 2016-06-01 NOTE — Patient Instructions (Addendum)
Your physician recommends that you schedule a follow-up appointment in: 1 Month   Your physician recommends that you continue on your current medications as directed. Please refer to the Current Medication list given to you today.  Your physician has requested that you have a lexiscan myoview. For further information please visit [email or web address]. Please follow instruction sheet, as given.  Your physician has requested that you have an echocardiogram. Echocardiography is a painless test that uses sound waves to create images of your heart. It provides your doctor with information about the size and shape of your heart and how well your heart's chambers and valves are working. This procedure takes approximately one hour. There are no restrictions for this procedure.  Your physician has recommended that you wear an event monitor. Event monitors are medical devices that record the heart's electrical activity. Doctors most often [place] these monitors to diagnose arrhythmias. Arrhythmias are problems with the speed or rhythm of the heartbeat. The monitor is a small, portable device. You can wear one while you do your normal daily activities. This is usually used to diagnose what is causing palpitations/syncope (passing out).  If you need a refill on your cardiac medications before your next appointment, please call your pharmacy.  Thank you for choosing [facility]!

## 2016-06-07 ENCOUNTER — Ambulatory Visit (HOSPITAL_COMMUNITY)
Admission: RE | Admit: 2016-06-07 | Discharge: 2016-06-07 | Disposition: A | Discharge status: Home / Self Care | Payer: Medicaid Other | Source: Ambulatory Visit | Attending: Physician Assistant | Admitting: Physician Assistant

## 2016-06-07 ENCOUNTER — Inpatient Hospital Stay (HOSPITAL_COMMUNITY): Admission: RE | Admit: 2016-06-07 | Payer: Medicaid Other | Source: Ambulatory Visit

## 2016-06-07 ENCOUNTER — Encounter (HOSPITAL_COMMUNITY): Payer: Self-pay

## 2016-06-07 DIAGNOSIS — R0609 Other forms of dyspnea: Secondary | ICD-10-CM | POA: Diagnosis not present

## 2016-06-07 DIAGNOSIS — E119 Type 2 diabetes mellitus without complications: Secondary | ICD-10-CM | POA: Diagnosis not present

## 2016-06-07 DIAGNOSIS — I5181 Takotsubo syndrome: Secondary | ICD-10-CM | POA: Diagnosis not present

## 2016-06-07 DIAGNOSIS — R002 Palpitations: Secondary | ICD-10-CM | POA: Diagnosis not present

## 2016-06-07 DIAGNOSIS — F4310 Post-traumatic stress disorder, unspecified: Secondary | ICD-10-CM | POA: Diagnosis not present

## 2016-06-07 DIAGNOSIS — E785 Hyperlipidemia, unspecified: Secondary | ICD-10-CM | POA: Insufficient documentation

## 2016-06-07 DIAGNOSIS — F319 Bipolar disorder, unspecified: Secondary | ICD-10-CM | POA: Insufficient documentation

## 2016-06-07 DIAGNOSIS — R079 Chest pain, unspecified: Secondary | ICD-10-CM | POA: Diagnosis not present

## 2016-06-07 LAB — NM MYOCAR MULTI W/SPECT W/WALL MOTION / EF
LV dias vol: 48 mL (ref 46–106)
LV sys vol: 9 mL
Peak HR: 98 {beats}/min
RATE: 0.37
Rest HR: 82 {beats}/min
SDS: 0
SRS: 2
SSS: 2
TID: 0.98

## 2016-06-07 MED ORDER — SODIUM CHLORIDE 0.9% FLUSH
INTRAVENOUS | Status: AC
  Administered 2016-06-07: 10 mL via INTRAVENOUS
  Filled 2016-06-07: qty 10

## 2016-06-07 MED ORDER — TECHNETIUM TC 99M TETROFOSMIN IV KIT
10.0000 | PACK | Freq: Once | INTRAVENOUS | Status: AC | PRN
  Administered 2016-06-07: 10 via INTRAVENOUS

## 2016-06-07 MED ORDER — REGADENOSON 0.4 MG/5ML IV SOLN
INTRAVENOUS | Status: AC
  Administered 2016-06-07: 0.4 mg via INTRAVENOUS
  Filled 2016-06-07: qty 5

## 2016-06-07 MED ORDER — TECHNETIUM TC 99M TETROFOSMIN IV KIT
30.0000 | PACK | Freq: Once | INTRAVENOUS | Status: AC | PRN
  Administered 2016-06-07: 30 via INTRAVENOUS

## 2016-06-07 NOTE — Progress Notes (Signed)
*  PRELIMINARY RESULTS* Echocardiogram 2D Echocardiogram has been performed.  Leavy Cella 06/07/2016, 9:25 AM

## 2016-06-08 ENCOUNTER — Telehealth: Payer: Self-pay | Admitting: *Deleted

## 2016-06-08 NOTE — Telephone Encounter (Signed)
-----   Message from Imogene Burn, PA-C sent at 06/08/2016  9:34 AM EDT ----- Low risk nuclear stress test with normal heart function. No evidence of ischemia. Follow-up with Dr. Harl Bowie.

## 2016-06-08 NOTE — Telephone Encounter (Signed)
Called patient with test results. No answer. Left message to call back.  

## 2016-06-12 ENCOUNTER — Ambulatory Visit (INDEPENDENT_AMBULATORY_CARE_PROVIDER_SITE_OTHER): Payer: Medicaid Other

## 2016-06-12 DIAGNOSIS — R079 Chest pain, unspecified: Secondary | ICD-10-CM | POA: Diagnosis not present

## 2016-06-12 DIAGNOSIS — R002 Palpitations: Secondary | ICD-10-CM

## 2016-06-21 ENCOUNTER — Encounter: Payer: Self-pay | Admitting: Cardiology

## 2016-07-01 ENCOUNTER — Telehealth: Payer: Self-pay | Admitting: Cardiology

## 2016-07-01 NOTE — Telephone Encounter (Signed)
Pt to call [employer] in regards to sending out new battery

## 2016-07-01 NOTE — Telephone Encounter (Signed)
Per phone call, battery has died on monitor w/ only after wearing for 2 weeks, pt would like to know what she should do.

## 2016-07-18 ENCOUNTER — Ambulatory Visit: Payer: Medicaid Other | Admitting: Cardiology

## 2016-07-19 ENCOUNTER — Ambulatory Visit (INDEPENDENT_AMBULATORY_CARE_PROVIDER_SITE_OTHER): Payer: Medicaid Other | Admitting: Cardiology

## 2016-07-19 ENCOUNTER — Encounter: Payer: Self-pay | Admitting: Cardiology

## 2016-07-19 VITALS — BP 104/64 | HR 89 | Ht 66.0 in | Wt 225.0 lb

## 2016-07-19 DIAGNOSIS — R079 Chest pain, unspecified: Secondary | ICD-10-CM | POA: Diagnosis not present

## 2016-07-19 DIAGNOSIS — R002 Palpitations: Secondary | ICD-10-CM

## 2016-07-19 NOTE — Patient Instructions (Signed)

## 2016-07-19 NOTE — Progress Notes (Signed)
Clinical Summary [name] is a 56 y.o.female last seen by PA Lenze, this is our first visit together. She is seen for the following medical problems  1. Chest pain - 05/2016 nuclear stress: no ischemia, LVEF 81%. Low risk - can still have chest pressure at times. Can be positional. Can be tender to palpation. Lasts about 20-30 minutes - prior heart cath in 2005-2006.   - still with chest pain at times. Atypical, tender to palpation.  2. Palpitations - reports fluttering feeling, lightheaded - normal thyroid she reports 3 months ago - has not interested in beta blocker.  - recent event monitor with occasional ectopy, short run of SVT.    Past Medical History:  Diagnosis Date  . Anxiety   . Bipolar disorder ([place])   . Depression   . Diabetes mellitus   . Elevated cholesterol   . Fibromyalgia   . GERD (gastroesophageal reflux disease)   . Migraine   . PTSD (post-traumatic stress disorder)    after death of son (accidental overdose)  . Thyroid disease      No Known Allergies   Current Outpatient Prescriptions  Medication Sig Dispense Refill  . aspirin EC 81 MG tablet Take 1 tablet (81 mg total) by mouth daily.    Marland Kitchen buPROPion (WELLBUTRIN SR) 100 MG 12 hr tablet Take 100 mg by mouth daily.    . canagliflozin (INVOKANA) 300 MG TABS tablet Take 300 mg by mouth daily. 30 tablet   . clonazePAM (KLONOPIN) 0.5 MG tablet Take 0.5 mg by mouth 3 (three) times daily.    Marland Kitchen exenatide (BYETTA) 10 MCG/0.04ML SOPN injection Inject 0.04 mLs (10 mcg total) into the skin 2 (two) times daily with a meal. Pt takes 10 mcg twice daily 2.4 mL   . gabapentin (NEURONTIN) 800 MG tablet Take 800 mg by mouth 4 (four) times daily.     Marland Kitchen glipiZIDE (GLUCOTROL XL) 5 MG 24 hr tablet Take 5 mg by mouth daily with breakfast.    . insulin glargine (LANTUS) 100 UNIT/ML injection Inject 55 Units into the skin at bedtime.     Marland Kitchen levothyroxine (SYNTHROID, LEVOTHROID) 150 MCG tablet Take 150 mcg by mouth daily  before breakfast.    . metFORMIN (GLUCOPHAGE) 500 MG tablet Take 2 tablets (1,000 mg total) by mouth 2 (two) times daily.    . methocarbamol (ROBAXIN) 500 MG tablet Take 1 tablet (500 mg total) by mouth 4 (four) times daily. (Patient taking differently: Take 500-1,000 mg by mouth daily as needed for muscle spasms. ) 90 tablet 5  . Oxycodone HCl 10 MG TABS Take 10 mg by mouth 3 (three) times daily as needed (for pain).     . QUEtiapine (SEROQUEL) 100 MG tablet Take 100 mg by mouth at bedtime.    . simvastatin (ZOCOR) 40 MG tablet Take 1 tablet (40 mg total) by mouth daily. (Patient taking differently: Take 40 mg by mouth daily at 6 PM. ) 30 tablet   . SUMAtriptan (IMITREX) 100 MG tablet One tablet po prn migraine.  May repeat in 2 hrs if needed.  Do not exceed 200 mg per 24 hrs. (Patient taking differently: Take 100 mg by mouth every 2 (two) hours as needed for migraine or headache. May repeat in 2 hrs if needed.  Do not exceed 200 mg per 24 hrs.) 5 tablet 0  . topiramate (TOPAMAX) 100 MG tablet Take 1 tablet (100 mg total) by mouth at bedtime.  No current facility-administered medications for this visit.      Past Surgical History:  Procedure Laterality Date  . CARPAL TUNNEL RELEASE  bilateral  . CHOLECYSTECTOMY    . COLONOSCOPY WITH PROPOFOL N/A 05/01/2012   FTD:DUKGU COLON polyps/Mild diverticulosis was noted in the sigmoid colon/Moderate sized internal hemorrhoids. path with tubular adenoma  . gallbladder    . KNEE ARTHROSCOPY     bilateral  . KNEE ARTHROSCOPY WITH MEDIAL MENISECTOMY Right 10/29/2015   Procedure: ARTHROSCOPY RIGHT KNEE  WITH PARTIAL MEDIAL MENISECTOMY;  Surgeon: Carole Civil, MD;  Location: [facility];  Service: Orthopedics;  Laterality: Right;  . POLYPECTOMY N/A 05/01/2012   Procedure: POLYPECTOMY;  Surgeon: Danie Binder, MD;  Location: [facility];  Service: Endoscopy;  Laterality: N/A;  . SHOULDER SURGERY  left  . VAGINAL HYSTERECTOMY       No Known  Allergies    Family History  Problem Relation Age of Onset  . Cancer    . Diabetes    . Heart disease    . Arthritis    . Stroke Mother   . Anxiety disorder Mother   . Heart failure Mother   . Cancer - Lung Father   . Lung disease Father   . Heart failure Sister   . Lung disease    . Heart disease Maternal Grandfather   . Stroke Paternal Grandmother   . Colon cancer Neg Hx      Social History [name] reports that she has been smoking Cigarettes.  She has a 0.75 pack-year smoking history. She has never used smokeless tobacco. [name] reports that she does not drink alcohol.   Review of Systems CONSTITUTIONAL: No weight loss, fever, chills, weakness or fatigue.  HEENT: Eyes: No visual loss, blurred vision, double vision or yellow sclerae.No hearing loss, sneezing, congestion, runny nose or sore throat.  SKIN: No rash or itching.  CARDIOVASCULAR: per HPI RESPIRATORY: No shortness of breath, cough or sputum.  GASTROINTESTINAL: No anorexia, nausea, vomiting or diarrhea. No abdominal pain or blood.  GENITOURINARY: No burning on urination, no polyuria NEUROLOGICAL: No headache, dizziness, syncope, paralysis, ataxia, numbness or tingling in the extremities. No change in bowel or bladder control.  MUSCULOSKELETAL: No muscle, back pain, joint pain or stiffness.  LYMPHATICS: No enlarged nodes. No history of splenectomy.  PSYCHIATRIC: No history of depression or anxiety.  ENDOCRINOLOGIC: No reports of sweating, cold or heat intolerance. No polyuria or polydipsia.  Marland Kitchen   Physical Examination Vitals:   07/19/16 1408  BP: 104/64  Pulse: 89   Vitals:   07/19/16 1408  Weight: 225 lb (102.1 kg)  Height: 5\' 6"  (1.676 m)    Gen: resting comfortably, no acute distress HEENT: no scleral icterus, pupils equal round and reactive, no palptable cervical adenopathy,  CV: RRR, no m/rg, no jvd Resp: Clear to auscultation bilaterally GI: abdomen is soft, non-tender, non-distended,  normal bowel sounds, no hepatosplenomegaly MSK: extremities are warm, no edema.  Skin: warm, no rash Neuro:  no focal deficits Psych: appropriate affect   Diagnostic Studies 05/2016 echo Study Conclusions  - Left ventricle: The cavity size was normal. Wall thickness was   increased in a pattern of mild LVH. Systolic function was normal.   The estimated ejection fraction was in the range of 60% to 65%.   Wall motion was normal; there were no regional wall motion   abnormalities. Doppler parameters are consistent with abnormal   left ventricular relaxation (grade 1 diastolic dysfunction). - Aortic  valve: Mildly calcified annulus. Trileaflet. - Mitral valve: Calcified annulus. There was trivial regurgitation. - Right atrium: Central venous pressure (est): 3 mm Hg. - Atrial septum: No defect or patent foramen ovale was identified. - Tricuspid valve: There was trivial regurgitation. - Pulmonary arteries: Systolic pressure could not be accurately   estimated. - Pericardium, extracardiac: A prominent pericardial fat pad was   present.  Impressions:  - Mild LVH with LVEF 60-65% and grade 1 diastolic dysfunction.   Mildly calcified mitral and aortic annulus. Trivial mitral   regurgitation. Trivial tricuspid regurgitation. Prominent   pericardial fat pad.  05/2016 nuclear stress  No diagnostic ST segment changes to indicate ischemia.  Small, mild intensity, fixed apical anteroseptal defect that is most consistent with breast attenuation.  This is a low risk study.  Nuclear stress EF: 81%.  05/2016 Event monitor  Telemetry tracings show sinus rhythm and sinus tachycardia  One short episode of probable SVT. Some mild R-R irregularity, not overall consistent with afib  Symptoms correlated with sinus rhythm and sinus tachycardia. One short   Assessment and Plan   1. Chest pain - atypical chest pain, negative stress test - continue to monitor  2. Palpitations - event  monitor with benign ectopy, short run of SVT. - she is not intrested in medical therapy at this time, symptoms tolerable      Arnoldo Lenis, M.D.

## 2016-10-12 ENCOUNTER — Other Ambulatory Visit (HOSPITAL_COMMUNITY): Payer: Self-pay | Admitting: Internal Medicine

## 2016-10-12 DIAGNOSIS — Z1231 Encounter for screening mammogram for malignant neoplasm of breast: Secondary | ICD-10-CM

## 2016-10-19 ENCOUNTER — Inpatient Hospital Stay (HOSPITAL_COMMUNITY): Admission: RE | Admit: 2016-10-19 | Payer: Medicaid Other | Source: Ambulatory Visit

## 2016-10-21 ENCOUNTER — Ambulatory Visit (HOSPITAL_COMMUNITY)
Admission: RE | Admit: 2016-10-21 | Discharge: 2016-10-21 | Disposition: A | Discharge status: Home / Self Care | Payer: Medicaid Other | Source: Ambulatory Visit | Attending: Internal Medicine | Admitting: Internal Medicine

## 2016-10-21 DIAGNOSIS — Z1231 Encounter for screening mammogram for malignant neoplasm of breast: Secondary | ICD-10-CM | POA: Insufficient documentation

## 2016-10-27 ENCOUNTER — Encounter (HOSPITAL_COMMUNITY): Payer: Self-pay | Admitting: Emergency Medicine

## 2016-10-27 ENCOUNTER — Emergency Department (HOSPITAL_COMMUNITY)
Admission: EM | Admit: 2016-10-27 | Discharge: 2016-10-27 | Disposition: A | Discharge status: Home / Self Care | Payer: Medicaid Other | Attending: Emergency Medicine | Admitting: Emergency Medicine

## 2016-10-27 ENCOUNTER — Emergency Department (HOSPITAL_COMMUNITY): Payer: Medicaid Other

## 2016-10-27 DIAGNOSIS — Z7984 Long term (current) use of oral hypoglycemic drugs: Secondary | ICD-10-CM | POA: Diagnosis not present

## 2016-10-27 DIAGNOSIS — E119 Type 2 diabetes mellitus without complications: Secondary | ICD-10-CM | POA: Insufficient documentation

## 2016-10-27 DIAGNOSIS — R0600 Dyspnea, unspecified: Secondary | ICD-10-CM | POA: Insufficient documentation

## 2016-10-27 DIAGNOSIS — Z7982 Long term (current) use of aspirin: Secondary | ICD-10-CM | POA: Insufficient documentation

## 2016-10-27 DIAGNOSIS — R200 Anesthesia of skin: Secondary | ICD-10-CM | POA: Insufficient documentation

## 2016-10-27 DIAGNOSIS — F17210 Nicotine dependence, cigarettes, uncomplicated: Secondary | ICD-10-CM | POA: Insufficient documentation

## 2016-10-27 DIAGNOSIS — R0789 Other chest pain: Secondary | ICD-10-CM | POA: Diagnosis present

## 2016-10-27 DIAGNOSIS — Z79899 Other long term (current) drug therapy: Secondary | ICD-10-CM | POA: Diagnosis not present

## 2016-10-27 LAB — CBC WITH DIFFERENTIAL/PLATELET
Basophils Absolute: 0 10*3/uL (ref 0.0–0.1)
Basophils Relative: 0 %
Eosinophils Absolute: 0.1 10*3/uL (ref 0.0–0.7)
Eosinophils Relative: 1 %
HCT: 47.9 % — ABNORMAL HIGH (ref 36.0–46.0)
Hemoglobin: 16.7 g/dL — ABNORMAL HIGH (ref 12.0–15.0)
Lymphocytes Relative: 40 %
Lymphs Abs: 4.5 10*3/uL — ABNORMAL HIGH (ref 0.7–4.0)
MCH: 32.2 pg (ref 26.0–34.0)
MCHC: 34.9 g/dL (ref 30.0–36.0)
MCV: 92.5 fL (ref 78.0–100.0)
Monocytes Absolute: 0.5 10*3/uL (ref 0.1–1.0)
Monocytes Relative: 5 %
Neutro Abs: 6 10*3/uL (ref 1.7–7.7)
Neutrophils Relative %: 54 %
Platelets: 261 10*3/uL (ref 150–400)
RBC: 5.18 MIL/uL — ABNORMAL HIGH (ref 3.87–5.11)
RDW: 13.1 % (ref 11.5–15.5)
WBC: 11.1 10*3/uL — ABNORMAL HIGH (ref 4.0–10.5)

## 2016-10-27 LAB — COMPREHENSIVE METABOLIC PANEL
ALT: 58 U/L — ABNORMAL HIGH (ref 14–54)
AST: 49 U/L — ABNORMAL HIGH (ref 15–41)
Albumin: 3.8 g/dL (ref 3.5–5.0)
Alkaline Phosphatase: 63 U/L (ref 38–126)
Anion gap: 10 (ref 5–15)
BUN: 21 mg/dL — ABNORMAL HIGH (ref 6–20)
CO2: 21 mmol/L — ABNORMAL LOW (ref 22–32)
Calcium: 9 mg/dL (ref 8.9–10.3)
Chloride: 100 mmol/L — ABNORMAL LOW (ref 101–111)
Creatinine, Ser: 0.78 mg/dL (ref 0.44–1.00)
GFR calc Af Amer: >60 mL/min (ref >60)
GFR calc non Af Amer: >60 mL/min (ref >60)
Glucose, Bld: 246 mg/dL — ABNORMAL HIGH (ref 65–99)
Potassium: 3.9 mmol/L (ref 3.5–5.1)
Sodium: 131 mmol/L — ABNORMAL LOW (ref 135–145)
Total Bilirubin: 0.9 mg/dL (ref 0.3–1.2)
Total Protein: 6.3 g/dL — ABNORMAL LOW (ref 6.5–8.1)

## 2016-10-27 LAB — D-DIMER, QUANTITATIVE: D-Dimer, Quant: 0.78 ug/mL-FEU — ABNORMAL HIGH (ref 0.00–0.50)

## 2016-10-27 LAB — PROTIME-INR
INR: 0.98
Prothrombin Time: 13 seconds (ref 11.4–15.2)

## 2016-10-27 LAB — MAGNESIUM: Magnesium: 1.9 mg/dL (ref 1.7–2.4)

## 2016-10-27 LAB — CBG MONITORING, ED: Glucose-Capillary: 226 mg/dL — ABNORMAL HIGH (ref 65–99)

## 2016-10-27 LAB — TROPONIN I: Troponin I: <0.03 ng/mL (ref <0.03)

## 2016-10-27 LAB — BRAIN NATRIURETIC PEPTIDE: B Natriuretic Peptide: 12 pg/mL (ref 0.0–100.0)

## 2016-10-27 MED ORDER — ALBUTEROL SULFATE HFA 108 (90 BASE) MCG/ACT IN AERS
2.0000 | INHALATION_SPRAY | Freq: Once | RESPIRATORY_TRACT | Status: AC
  Administered 2016-10-27: 2 via RESPIRATORY_TRACT
  Filled 2016-10-27: qty 6.7

## 2016-10-27 MED ORDER — ASPIRIN 81 MG PO CHEW
324.0000 mg | CHEWABLE_TABLET | Freq: Once | ORAL | Status: AC
  Administered 2016-10-27: 324 mg via ORAL
  Filled 2016-10-27: qty 4

## 2016-10-27 MED ORDER — PREDNISONE 50 MG PO TABS
60.0000 mg | ORAL_TABLET | ORAL | Status: AC
  Administered 2016-10-27: 60 mg via ORAL
  Filled 2016-10-27: qty 1

## 2016-10-27 MED ORDER — ALBUTEROL SULFATE (2.5 MG/3ML) 0.083% IN NEBU
5.0000 mg | INHALATION_SOLUTION | Freq: Once | RESPIRATORY_TRACT | Status: AC
  Administered 2016-10-27: 5 mg via RESPIRATORY_TRACT
  Filled 2016-10-27: qty 6

## 2016-10-27 MED ORDER — KETOROLAC TROMETHAMINE 30 MG/ML IJ SOLN
15.0000 mg | Freq: Once | INTRAMUSCULAR | Status: AC
  Administered 2016-10-27: 15 mg via INTRAVENOUS
  Filled 2016-10-27: qty 1

## 2016-10-27 MED ORDER — PREDNISONE 20 MG PO TABS: 40.0000 mg | ORAL_TABLET | Freq: Every day | ORAL | 0 refills | Status: DC

## 2016-10-27 MED ORDER — IOPAMIDOL (ISOVUE-370) INJECTION 76%
100.0000 mL | Freq: Once | INTRAVENOUS | Status: AC | PRN
  Administered 2016-10-27: 100 mL via INTRAVENOUS

## 2016-10-27 NOTE — ED Triage Notes (Signed)
Cp in center of chest, both shoulder blades, LT arm neck and head. Started x 1 hour ago.  States she thought it was a panic attack, took 0.5klonopin with no relief x 1 hour ago.

## 2016-10-27 NOTE — ED Notes (Signed)
Pt returned from xray

## 2016-10-27 NOTE — Discharge Instructions (Signed)
As discussed, today's evaluation has been generally reassuring, and there is some suspicion for underlying pulmonary disease as can bring to your chest pain episode.  Please be sure to discuss this with your physician on follow-up check within 1 week.  Next 2 days please use the provided albuterol inhaler every 4 hours. You will also need to take the prescribed steroids as directed.  Return here for concerning changes in your condition.

## 2016-10-27 NOTE — ED Provider Notes (Signed)
[facility] DEPT Provider Note   CSN: [id number] Arrival date & time: 10/27/16  1307     History   Chief Complaint Chief Complaint  Patient presents with  . Chest Pain    HPI [place] H Ringer is a 56 y.o. female.  HPI  Patient presents with concern of chest pain, dyspnea, heaviness between her shoulder blades. Onset was about 2 hours ago, without clear precipitant. Initially or palpitations, but subsequently the patient felt the aforementioned complaints. No ongoing palpitations. Since onset no clear alleviating or exacerbating factors, and she feels persistent dyspnea. No fever, no chills.  Numbness in all 4 extremities, but no loss of sensation, no loss of strength. Patient notes that she has had similar episodes in the past, been evaluated here in the past. Patient is a smoker, 2 packs a day.   Smoking cessation provided, particularly in light of this patient's evaluation in the ED.    Past Medical History:  Diagnosis Date  . Anxiety   . Bipolar disorder ([place])   . Depression   . Diabetes mellitus   . Elevated cholesterol   . Fibromyalgia   . GERD (gastroesophageal reflux disease)   . Migraine   . PTSD (post-traumatic stress disorder)    after death of son (accidental overdose)  . Thyroid disease     Patient Active Problem List   Diagnosis Date Noted  . 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 Palpitations 06/01/2016  . Hyperlipidemia 06/01/2016  . Tobacco abuse 06/01/2016  . Dyspnea on exertion 05/16/2016  . Left leg swelling 05/16/2016  . Diabetes mellitus ([place]) 05/16/2016  . Chest pain   . Swelling   . Acute medial meniscus tear of left knee   . Post traumatic stress disorder (PTSD) 11/08/2014  . MDD (major depressive disorder), recurrent severe, without psychosis ([place]) 11/08/2014  . 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 Panic disorder 11/08/2014  . Chronic pain syndrome 11/21/2013  . Primary  osteoarthritis of both knees 11/21/2013  . Spinal stenosis of lumbar region 11/21/2013  . Difficulty in walking(719.7) 10/14/2013  . Sciatica associated with disorder of lumbar spine 09/17/2013  . Lower back pain 08/08/2013  . Lateral meniscus, posterior horn derangement 06/13/2013  . Arthritis of right knee 06/13/2013  . S/P medial meniscectomy of right knee 06/13/2013  . Left shoulder pain 08/28/2012  . Rotator cuff syndrome of left shoulder 08/28/2012  . Rotator cuff tear 08/28/2012  . Pes anserinus bursitis 05/16/2012  . Encounter for screening colonoscopy 04/16/2012  . Fatty liver 04/16/2012  . Back pain 07/26/2011  . Knee bursitis, left 07/26/2011  . OA (osteoarthritis) of knee 07/26/2011  . Spinal stenosis 10/28/2010  . Medial meniscus, posterior horn derangement 08/12/2010  . Knee pain 08/04/2010  . Sciatica 08/04/2010  . H N P-LUMBAR 01/14/2010  . 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 LUMBOSACRAL STRAIN 05/12/2009  . DERANGEMENT OF POSTERIOR HORN OF MEDIAL MENISCUS 01/19/2009  . BACK PAIN 01/19/2009  . LOWER LEG, ARTHRITIS, DEGEN./OSTEO 12/02/2008  . JOINT EFFUSION, KNEE 11/10/2008  . ANSERINE BURSITIS, RIGHT 11/10/2008  . DERANGEMENT MENISCUS 10/15/2008  . KNEE PAIN 10/15/2008    Past Surgical History:  Procedure Laterality Date  . CARPAL TUNNEL RELEASE  bilateral  . CHOLECYSTECTOMY    . COLONOSCOPY WITH PROPOFOL N/A 05/01/2012   MOQ:HUTML COLON polyps/Mild diverticulosis was noted in the sigmoid colon/Moderate sized internal hemorrhoids. path with tubular adenoma  . gallbladder    . KNEE ARTHROSCOPY     bilateral  . KNEE  ARTHROSCOPY WITH MEDIAL MENISECTOMY Right 10/29/2015   Procedure: ARTHROSCOPY RIGHT KNEE  WITH PARTIAL MEDIAL MENISECTOMY;  Surgeon: Carole Civil, MD;  Location: [facility];  Service: Orthopedics;  Laterality: Right;  . POLYPECTOMY N/A 05/01/2012   Procedure: POLYPECTOMY;  Surgeon: Danie Binder, MD;  Location: [facility];  Service: Endoscopy;   Laterality: N/A;  . SHOULDER SURGERY  left  . VAGINAL HYSTERECTOMY      OB History    No data available       Home Medications    Prior to Admission medications   Medication Sig Start Date End Date Taking? Authorizing Provider  aspirin EC 81 MG tablet Take 1 tablet (81 mg total) by mouth daily. 11/10/14   Niel Hummer, NP  buPROPion [facility] SR) 100 MG 12 hr tablet Take 100 mg by mouth daily.    [provider]  canagliflozin (INVOKANA) 300 MG TABS tablet Take 300 mg by mouth daily. 11/10/14   Niel Hummer, NP  clonazePAM (KLONOPIN) 0.5 MG tablet Take 0.5 mg by mouth 3 (three) times daily.    [provider]  exenatide (BYETTA) 10 MCG/0.04ML SOPN injection Inject 0.04 mLs (10 mcg total) into the skin 2 (two) times daily with a meal. Pt takes 10 mcg twice daily 11/10/14   Elmarie Shiley A, NP  gabapentin (NEURONTIN) 800 MG tablet Take 800 mg by mouth 4 (four) times daily.     [provider]  glipiZIDE (GLUCOTROL XL) 5 MG 24 hr tablet Take 5 mg by mouth daily with breakfast.    [provider]  insulin glargine (LANTUS) 100 UNIT/ML injection Inject 55 Units into the skin at bedtime.     [provider]  levothyroxine (SYNTHROID, LEVOTHROID) 150 MCG tablet Take 150 mcg by mouth daily before breakfast.    [provider]  metFORMIN (GLUCOPHAGE) 500 MG tablet Take 2 tablets (1,000 mg total) by mouth 2 (two) times daily. 11/10/14   Niel Hummer, NP  methocarbamol (ROBAXIN) 500 MG tablet Take 1 tablet (500 mg total) by mouth 4 (four) times daily. Patient taking differently: Take 500-1,000 mg by mouth daily as needed for muscle spasms.  10/07/15   Carole Civil, MD  Oxycodone HCl 10 MG TABS Take 10 mg by mouth 3 (three) times daily as needed (for pain).     [provider]  QUEtiapine (SEROQUEL) 100 MG tablet Take 100 mg by mouth at bedtime.    [provider]  simvastatin (ZOCOR) 40 MG tablet Take 1 tablet (40 mg  total) by mouth daily. Patient taking differently: Take 40 mg by mouth daily at 6 PM.  11/10/14   Niel Hummer, NP  SUMAtriptan (IMITREX) 100 MG tablet One tablet po prn migraine.  May repeat in 2 hrs if needed.  Do not exceed 200 mg per 24 hrs. Patient taking differently: Take 100 mg by mouth every 2 (two) hours as needed for migraine or headache. May repeat in 2 hrs if needed.  Do not exceed 200 mg per 24 hrs. 06/26/12   Triplett, Tammy, PA-C  topiramate (TOPAMAX) 100 MG tablet Take 1 tablet (100 mg total) by mouth at bedtime. 11/10/14   Niel Hummer, NP    Family History Family History  Problem Relation Age of Onset  . Cancer Unknown   . Diabetes Unknown   . Heart disease Unknown   . Arthritis Unknown   . Stroke Mother   . Anxiety disorder Mother   .  Heart failure Mother   . Cancer - Lung Father   . Lung disease Father   . Heart failure Sister   . Lung disease Unknown   . Heart disease Maternal Grandfather   . Stroke Paternal Grandmother   . Colon cancer Neg Hx     Social History Social History  Substance Use Topics  . Smoking status: Current Every Day Smoker    Packs/day: 2.00    Years: 13.00    Types: Cigarettes  . Smokeless tobacco: Never Used  . Alcohol use No     Allergies   Patient has no known allergies.   Review of Systems Review of Systems  Constitutional:       Per HPI, otherwise negative  HENT:       Per HPI, otherwise negative  Respiratory:       Per HPI, otherwise negative  Cardiovascular:       Per HPI, otherwise negative  Gastrointestinal: Negative for vomiting.  Endocrine:       Negative aside from HPI  Genitourinary:       Neg aside from HPI   Musculoskeletal:       Per HPI, otherwise negative  Skin: Negative.   Neurological: Negative for syncope.     Physical Exam Updated Vital Signs BP (!) 101/56 (BP Location: Left Arm)   Pulse 86   Temp 97.8 F (36.6 C) (Oral)   Resp 20   Ht 5\' 6"  (1.676 m)   Wt 96.6 kg (213 lb)   SpO2  100%   BMI 34.38 kg/m   Physical Exam  Constitutional: She is oriented to person, place, and time. She appears well-developed and well-nourished. No distress.  HENT:  Head: Normocephalic and atraumatic.  Eyes: Conjunctivae and EOM are normal.  Cardiovascular: Normal rate and regular rhythm.   Pulmonary/Chest: No stridor. She has decreased breath sounds.  Abdominal: She exhibits no distension.  Musculoskeletal: She exhibits no edema.  Neurological: She is alert and oriented to person, place, and time. No cranial nerve deficit.  Skin: Skin is warm and dry.  Psychiatric: She has a normal mood and affect.  Nursing note and vitals reviewed.    ED Treatments / Results  Labs (all labs ordered are listed, but only abnormal results are displayed) Labs Reviewed  COMPREHENSIVE METABOLIC PANEL - Abnormal; Notable for the following:       Result Value   Sodium 131 (*)    Chloride 100 (*)    CO2 21 (*)    Glucose, Bld 246 (*)    BUN 21 (*)    Total Protein 6.3 (*)    AST 49 (*)    ALT 58 (*)    All other components within normal limits  CBC WITH DIFFERENTIAL/PLATELET - Abnormal; Notable for the following:    WBC 11.1 (*)    RBC 5.18 (*)    Hemoglobin 16.7 (*)    HCT 47.9 (*)    Lymphs Abs 4.5 (*)    All other components within normal limits  D-DIMER, QUANTITATIVE (NOT AT [facility]) - Abnormal; Notable for the following:    D-Dimer, Quant 0.78 (*)    All other components within normal limits  CBG MONITORING, ED - Abnormal; Notable for the following:    Glucose-Capillary 226 (*)    All other components within normal limits  MAGNESIUM  TROPONIN I  BRAIN NATRIURETIC PEPTIDE  PROTIME-INR  I-STAT BETA HCG BLOOD, ED ([facility], [facility], [facility] ONLY)    EKG  EKG Interpretation  Date/Time:  Thursday October 27 2016 13:18:32 EDT Ventricular Rate:  84 PR Interval:    QRS Duration: 96 QT Interval:  373 QTC Calculation: 441 R Axis:   35 Text Interpretation:  Sinus rhythm Low voltage, precordial leads  Anteroseptal infarct, old No significant change since last tracing Abnormal ekg Confirmed by Carmin Muskrat [phone]) on 10/27/2016 1:23:09 PM       Radiology Dg Chest 2 View  Result Date: 10/27/2016 CLINICAL DATA:  Chest pain, shortness of breath today, some weakness and dizziness, smoking history EXAM: CHEST  2 VIEW COMPARISON:  CT chest of 05/16/2016 and chest x-ray of the same day FINDINGS: No active infiltrate or effusion is seen. Mediastinal and hilar contours are unremarkable. The heart is within normal limits in size. There are degenerative changes diffusely throughout the thoracic spine. IMPRESSION: No active cardiopulmonary disease. Electronically Signed   By: Ivar Drape M.D.   On: 10/27/2016 14:34   Ct Angio Chest Pe W/cm &/or Wo Cm  Result Date: 10/27/2016 CLINICAL DATA:  Chest pain. EXAM: CT ANGIOGRAPHY CHEST WITH CONTRAST TECHNIQUE: Multidetector CT imaging of the chest was performed using the standard protocol during bolus administration of intravenous contrast. Multiplanar CT image reconstructions and MIPs were obtained to evaluate the vascular anatomy. CONTRAST:  100 mL of Isovue 370 intravenously. COMPARISON:  CT scan of May 16, 2016. FINDINGS: Cardiovascular: Satisfactory opacification of the pulmonary arteries to the segmental level. No evidence of pulmonary embolism. Normal heart size. No pericardial effusion. There is no evidence of thoracic aortic dissection or aneurysm. Mediastinum/Nodes: No enlarged mediastinal, hilar, or axillary lymph nodes. Thyroid gland, trachea, and esophagus demonstrate no significant findings. Lungs/Pleura: Lungs are clear. No pleural effusion or pneumothorax. Upper Abdomen: No acute abnormality. Musculoskeletal: No chest wall abnormality. No acute or significant osseous findings. Review of the MIP images confirms the above findings. IMPRESSION: No definite evidence of pulmonary embolus. No acute cardiopulmonary abnormality seen. Electronically Signed    By: Marijo Conception, M.D.   On: 10/27/2016 16:14    Procedures Procedures (including critical care time)  Medications Ordered in ED Medications  albuterol (PROVENTIL HFA;VENTOLIN HFA) 108 (90 Base) MCG/ACT inhaler 2 puff (not administered)  aspirin chewable tablet 324 mg (324 mg Oral Given 10/27/16 1349)  ketorolac (TORADOL) 30 MG/ML injection 15 mg (15 mg Intravenous Given 10/27/16 1354)  albuterol (PROVENTIL) (2.5 MG/3ML) 0.083% nebulizer solution 5 mg (5 mg Nebulization Given 10/27/16 1510)  iopamidol (ISOVUE-370) 76 % injection 100 mL (100 mLs Intravenous Contrast Given 10/27/16 1545)   Chart review notable for 2 CT angiography to exclude pulmonary embolism studies within the past 12 months  Initial Impression / Assessment and Plan / ED Course  I have reviewed the triage vital signs and the nursing notes.  Pertinent labs & imaging results that were available during my care of the patient were reviewed by me and considered in my medical decision making (see chart for details).  4:54 PM Now, after patient has received steroids,Toradol, albuterol, she appears better, much more calm. No ongoing chest pain. Labs, imaging, no evidence for PE. Morphology of the patient's history of smoking, and some suspicion for COPD, though she does not have this diagnosis. With no ongoing hypoxia, no ongoing chest pain, patient is appropriate for outpatient follow-up, and was started on a course of steroids, scheduled albuterol.  Final Clinical Impressions(s) / ED Diagnoses  Atypical chest pain   Carmin Muskrat, MD 10/27/16 1655

## 2016-12-13 ENCOUNTER — Emergency Department (HOSPITAL_COMMUNITY): Payer: Medicaid Other

## 2016-12-13 ENCOUNTER — Encounter (HOSPITAL_COMMUNITY): Payer: Self-pay | Admitting: Emergency Medicine

## 2016-12-13 ENCOUNTER — Emergency Department (HOSPITAL_COMMUNITY)
Admission: EM | Admit: 2016-12-13 | Discharge: 2016-12-13 | Disposition: A | Discharge status: Home / Self Care | Payer: Medicaid Other | Attending: Emergency Medicine | Admitting: Emergency Medicine

## 2016-12-13 DIAGNOSIS — W109XXA Fall (on) (from) unspecified stairs and steps, initial encounter: Secondary | ICD-10-CM | POA: Diagnosis not present

## 2016-12-13 DIAGNOSIS — E119 Type 2 diabetes mellitus without complications: Secondary | ICD-10-CM | POA: Diagnosis not present

## 2016-12-13 DIAGNOSIS — Z7984 Long term (current) use of oral hypoglycemic drugs: Secondary | ICD-10-CM | POA: Diagnosis not present

## 2016-12-13 DIAGNOSIS — Y999 Unspecified external cause status: Secondary | ICD-10-CM | POA: Insufficient documentation

## 2016-12-13 DIAGNOSIS — Y929 Unspecified place or not applicable: Secondary | ICD-10-CM | POA: Diagnosis not present

## 2016-12-13 DIAGNOSIS — M160 Bilateral primary osteoarthritis of hip: Secondary | ICD-10-CM | POA: Insufficient documentation

## 2016-12-13 DIAGNOSIS — F17210 Nicotine dependence, cigarettes, uncomplicated: Secondary | ICD-10-CM | POA: Insufficient documentation

## 2016-12-13 DIAGNOSIS — Z7982 Long term (current) use of aspirin: Secondary | ICD-10-CM | POA: Diagnosis not present

## 2016-12-13 DIAGNOSIS — E079 Disorder of thyroid, unspecified: Secondary | ICD-10-CM | POA: Insufficient documentation

## 2016-12-13 DIAGNOSIS — Y939 Activity, unspecified: Secondary | ICD-10-CM | POA: Diagnosis not present

## 2016-12-13 DIAGNOSIS — Z79899 Other long term (current) drug therapy: Secondary | ICD-10-CM | POA: Insufficient documentation

## 2016-12-13 DIAGNOSIS — S39012A Strain of muscle, fascia and tendon of lower back, initial encounter: Secondary | ICD-10-CM | POA: Insufficient documentation

## 2016-12-13 DIAGNOSIS — M5136 Other intervertebral disc degeneration, lumbar region: Secondary | ICD-10-CM

## 2016-12-13 MED ORDER — OXYCODONE-ACETAMINOPHEN 5-325 MG PO TABS
2.0000 | ORAL_TABLET | Freq: Once | ORAL | Status: AC
  Administered 2016-12-13: 2 via ORAL
  Filled 2016-12-13: qty 2

## 2016-12-13 MED ORDER — DIAZEPAM 5 MG PO TABS
10.0000 mg | ORAL_TABLET | Freq: Once | ORAL | Status: AC
  Administered 2016-12-13: 10 mg via ORAL
  Filled 2016-12-13: qty 2

## 2016-12-13 MED ORDER — ONDANSETRON HCL 4 MG PO TABS
4.0000 mg | ORAL_TABLET | Freq: Once | ORAL | Status: AC
  Administered 2016-12-13: 4 mg via ORAL
  Filled 2016-12-13: qty 1

## 2016-12-13 MED ORDER — CYCLOBENZAPRINE HCL 10 MG PO TABS: 10.0000 mg | ORAL_TABLET | Freq: Three times a day (TID) | ORAL | 0 refills | Status: DC

## 2016-12-13 NOTE — Discharge Instructions (Signed)
The CT scan of your lumbar spine again shows degenerative disc disease particularly at the L2-L3 and L3-L4 area. There is also arthritis present. There is no central canal stenosis or narrowing appreciated at this time. The x-ray of your pelvis shows arthritis within the pelvis itself as well as arthritis in your hips. Your examination suggests spasm particularly with change of position. Please add Flexeril 3 times daily to your current medications. Please discuss your symptoms and change in your symptoms with your primary physician Dr. Willey Blade, and or your neurosurgeon. Please return to the emergency department if any emergent changes, problems, or concerns.

## 2016-12-13 NOTE — ED Triage Notes (Signed)
Patient fell up the steps on Sunday, tripped over a cat causing low back pain, pain is worse today. Pt is tearful, states pain is radiating into groin

## 2016-12-13 NOTE — ED Provider Notes (Signed)
[facility] DEPT Provider Note   CSN: [id number] Arrival date & time: 12/13/16  0906     History   Chief Complaint Chief Complaint  Patient presents with  . Back Pain    HPI [name] is a 56 y.o. female.  Patient is a 28-y/o female who presents to the emergency department with a complaint of lower back pain. The patient states that on September 23 she tripped over her cat and fell up the steps. She injured her lower back. Following this she noted pain froer lower back radiating into the right groin area. The pain got progressively worse, and would not respond to oxycodone 10 mg. The patient came to the emergency department for additional evaluation and management. No loss of bowel or bladder function reported. No changes in weakness or problems use of the lower extremities.    Back Pain   This is a new problem. Pertinent negatives include no chest pain, no abdominal pain and no dysuria.    Past Medical History:  Diagnosis Date  . Anxiety   . Bipolar disorder ([place])   . Depression   . Diabetes mellitus   . Elevated cholesterol   . Fibromyalgia   . GERD (gastroesophageal reflux disease)   . Migraine   . PTSD (post-traumatic stress disorder)    after death of son (accidental overdose)  . Thyroid disease     Patient Active Problem List   Diagnosis Date Noted  . 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 Palpitations 06/01/2016  . Hyperlipidemia 06/01/2016  . Tobacco abuse 06/01/2016  . Dyspnea on exertion 05/16/2016  . Left leg swelling 05/16/2016  . Diabetes mellitus ([place]) 05/16/2016  . Chest pain   . Swelling   . Acute medial meniscus tear of left knee   . Post traumatic stress disorder (PTSD) 11/08/2014  . MDD (major depressive disorder), recurrent severe, without psychosis ([place]) 11/08/2014  . 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 Panic disorder 11/08/2014  . Chronic pain syndrome 11/21/2013  . Primary  osteoarthritis of both knees 11/21/2013  . Spinal stenosis of lumbar region 11/21/2013  . Difficulty in walking(719.7) 10/14/2013  . Sciatica associated with disorder of lumbar spine 09/17/2013  . Lower back pain 08/08/2013  . Lateral meniscus, posterior horn derangement 06/13/2013  . Arthritis of right knee 06/13/2013  . S/P medial meniscectomy of right knee 06/13/2013  . Left shoulder pain 08/28/2012  . Rotator cuff syndrome of left shoulder 08/28/2012  . Rotator cuff tear 08/28/2012  . Pes anserinus bursitis 05/16/2012  . Encounter for screening colonoscopy 04/16/2012  . Fatty liver 04/16/2012  . Back pain 07/26/2011  . Knee bursitis, left 07/26/2011  . OA (osteoarthritis) of knee 07/26/2011  . Spinal stenosis 10/28/2010  . Medial meniscus, posterior horn derangement 08/12/2010  . Knee pain 08/04/2010  . Sciatica 08/04/2010  . H N P-LUMBAR 01/14/2010  . 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 LUMBOSACRAL STRAIN 05/12/2009  . DERANGEMENT OF POSTERIOR HORN OF MEDIAL MENISCUS 01/19/2009  . BACK PAIN 01/19/2009  . LOWER LEG, ARTHRITIS, DEGEN./OSTEO 12/02/2008  . JOINT EFFUSION, KNEE 11/10/2008  . ANSERINE BURSITIS, RIGHT 11/10/2008  . DERANGEMENT MENISCUS 10/15/2008  . KNEE PAIN 10/15/2008    Past Surgical History:  Procedure Laterality Date  . CARPAL TUNNEL RELEASE  bilateral  . CHOLECYSTECTOMY    . COLONOSCOPY WITH PROPOFOL N/A 05/01/2012   WIO:MBTDH COLON polyps/Mild diverticulosis was noted in the sigmoid colon/Moderate sized internal hemorrhoids. path with tubular adenoma  . gallbladder    .  KNEE ARTHROSCOPY     bilateral  . KNEE ARTHROSCOPY WITH MEDIAL MENISECTOMY Right 10/29/2015   Procedure: ARTHROSCOPY RIGHT KNEE  WITH PARTIAL MEDIAL MENISECTOMY;  Surgeon: Carole Civil, MD;  Location: [facility];  Service: Orthopedics;  Laterality: Right;  . POLYPECTOMY N/A 05/01/2012   Procedure: POLYPECTOMY;  Surgeon: Danie Binder, MD;  Location: [facility];  Service: Endoscopy;   Laterality: N/A;  . SHOULDER SURGERY  left  . VAGINAL HYSTERECTOMY      OB History    No data available       Home Medications    Prior to Admission medications   Medication Sig Start Date End Date Taking? Authorizing Provider  aspirin EC 81 MG tablet Take 1 tablet (81 mg total) by mouth daily. 11/10/14  Yes Niel Hummer, NP  buPROPion [facility] SR) 100 MG 12 hr tablet Take 100 mg by mouth daily.   Yes [provider]  canagliflozin (INVOKANA) 300 MG TABS tablet Take 300 mg by mouth daily. 11/10/14  Yes Niel Hummer, NP  clonazePAM (KLONOPIN) 0.5 MG tablet Take 0.5 mg by mouth 3 (three) times daily.   Yes [provider]  Dulaglutide (TRULICITY) 1.5 BH/4.1PF SOPN Inject 0.5 mLs into the skin once a week.   Yes [provider]  gabapentin (NEURONTIN) 800 MG tablet Take 800 mg by mouth 4 (four) times daily.    Yes [provider]  glipiZIDE (GLUCOTROL XL) 5 MG 24 hr tablet Take 5 mg by mouth daily with breakfast.   Yes [provider]  levothyroxine (SYNTHROID, LEVOTHROID) 150 MCG tablet Take 150 mcg by mouth daily before breakfast.   Yes [provider]  metFORMIN (GLUCOPHAGE) 500 MG tablet Take 2 tablets (1,000 mg total) by mouth 2 (two) times daily. 11/10/14  Yes Niel Hummer, NP  methocarbamol (ROBAXIN) 500 MG tablet Take 1 tablet (500 mg total) by mouth 4 (four) times daily. Patient taking differently: Take 500-1,000 mg by mouth daily as needed for muscle spasms.  10/07/15  Yes Carole Civil, MD  Oxycodone HCl 10 MG TABS Take 10 mg by mouth 3 (three) times daily as needed (for pain).    Yes [provider]  QUEtiapine (SEROQUEL) 100 MG tablet Take 200 mg by mouth at bedtime.    Yes [provider]  simvastatin (ZOCOR) 40 MG tablet Take 1 tablet (40 mg total) by mouth daily. Patient taking differently: Take 40 mg by mouth daily at 6 PM.  11/10/14  Yes Niel Hummer, NP  SUMAtriptan (IMITREX) 100 MG  tablet One tablet po prn migraine.  May repeat in 2 hrs if needed.  Do not exceed 200 mg per 24 hrs. Patient taking differently: Take 100 mg by mouth every 2 (two) hours as needed for migraine or headache. May repeat in 2 hrs if needed.  Do not exceed 200 mg per 24 hrs. 06/26/12  Yes Triplett, Tammy, PA-C  topiramate (TOPAMAX) 100 MG tablet Take 1 tablet (100 mg total) by mouth at bedtime. Patient taking differently: Take 150 mg by mouth at bedtime.  11/10/14  Yes Niel Hummer, NP  cyclobenzaprine (FLEXERIL) 10 MG tablet Take 1 tablet (10 mg total) by mouth 3 (three) times daily. 12/13/16   Lily Kocher, PA-C  predniSONE (DELTASONE) 20 MG tablet Take 2 tablets (40 mg total) by mouth daily with breakfast. For the next four days Patient not taking: Reported on 12/13/2016 10/27/16   Carmin Muskrat, MD    Family  History Family History  Problem Relation Age of Onset  . Cancer Unknown   . Diabetes Unknown   . Heart disease Unknown   . Arthritis Unknown   . Stroke Mother   . Anxiety disorder Mother   . Heart failure Mother   . Cancer - Lung Father   . Lung disease Father   . Heart failure Sister   . Lung disease Unknown   . Heart disease Maternal Grandfather   . Stroke Paternal Grandmother   . Colon cancer Neg Hx     Social History Social History  Substance Use Topics  . Smoking status: Current Every Day Smoker    Packs/day: 2.00    Years: 13.00    Types: Cigarettes  . Smokeless tobacco: Never Used  . Alcohol use No     Allergies   Patient has no known allergies.   Review of Systems Review of Systems  Constitutional: Negative for activity change.       All ROS Neg except as noted in HPI  HENT: Negative for nosebleeds.   Eyes: Negative for photophobia and discharge.  Respiratory: Negative for cough, shortness of breath and wheezing.   Cardiovascular: Negative for chest pain and palpitations.  Gastrointestinal: Negative for abdominal pain and blood in stool.  Genitourinary:  Negative for dysuria, frequency and hematuria.  Musculoskeletal: Positive for back pain. Negative for arthralgias and neck pain.  Skin: Negative.   Neurological: Negative for dizziness, seizures and speech difficulty.  Psychiatric/Behavioral: Negative for confusion and hallucinations.     Physical Exam Updated Vital Signs BP (!) 110/56   Pulse 70   Temp 97.8 F (36.6 C) (Oral)   Resp 20   Ht 5\' 6"  (1.676 m)   Wt 97.1 kg (214 lb)   SpO2 96%   BMI 34.54 kg/m   Physical Exam  Constitutional: She is oriented to person, place, and time. She appears well-developed and well-nourished.  Non-toxic appearance.  HENT:  Head: Normocephalic.  Right Ear: Tympanic membrane and external ear normal.  Left Ear: Tympanic membrane and external ear normal.  Eyes: Pupils are equal, round, and reactive to light. EOM and lids are normal.  Neck: Normal range of motion. Neck supple. Carotid bruit is not present.  Cardiovascular: Normal rate, regular rhythm, normal heart sounds, intact distal pulses and normal pulses.   Pulmonary/Chest: Breath sounds normal. No respiratory distress.  Abdominal: Soft. Bowel sounds are normal. There is no tenderness. There is no guarding.  Musculoskeletal:       Lumbar back: She exhibits decreased range of motion, pain and spasm.       Back:  Lymphadenopathy:       Head (right side): No submandibular adenopathy present.       Head (left side): No submandibular adenopathy present.    She has no cervical adenopathy.  Neurological: She is alert and oriented to person, place, and time. She has normal strength. No cranial nerve deficit or sensory deficit.  Skin: Skin is warm and dry.  Psychiatric: She has a normal mood and affect. Her speech is normal.  Nursing note and vitals reviewed.    ED Treatments / Results  Labs (all labs ordered are listed, but only abnormal results are displayed) Labs Reviewed - No data to display  EKG  EKG Interpretation None        Radiology Dg Pelvis 1-2 Views  Result Date: 12/13/2016 CLINICAL DATA:  Low back pain, posterior right hip pain. EXAM: PELVIS - 1-2 VIEW COMPARISON:  None. FINDINGS: Mild symmetric degenerative changes in the hips bilaterally with early spurring. SI joints are symmetric and unremarkable. No acute bony abnormality. Specifically, no fracture, subluxation, or dislocation. Soft tissues are intact. IMPRESSION: Early symmetric osteoarthritic changes in the hips bilaterally. No acute bony abnormality. Electronically Signed   By: Rolm Baptise M.D.   On: 12/13/2016 10:40   Ct Lumbar Spine Wo Contrast  Result Date: 12/13/2016 CLINICAL DATA:  Low back pain extending to the right leg. Status post fall. EXAM: CT LUMBAR SPINE WITHOUT CONTRAST TECHNIQUE: Multidetector CT imaging of the lumbar spine was performed without intravenous contrast administration. Multiplanar CT image reconstructions were also generated. COMPARISON:  03/21/2011 FINDINGS: Segmentation: 5 lumbar type vertebrae. Alignment: Normal. Vertebrae: No acute fracture or focal pathologic process. Paraspinal and other soft tissues: No paraspinal abnormality. Abdominal aortic atherosclerosis. Disc levels: Mild degenerative disc disease with disc height loss at L2-3 and L3-4. Bilateral facet arthropathy throughout the lumbar spine. No significant foraminal or central canal stenosis. Mild broad-based disc bulge at L3-4 and L4-5. IMPRESSION: 1.  No acute osseous injury of the lumbar spine. 2. Aortic Atherosclerosis (ICD10-170.0) Electronically Signed   By: Kathreen Devoid   On: 12/13/2016 10:54    Procedures Procedures (including critical care time)  Medications Ordered in ED Medications  diazepam (VALIUM) tablet 10 mg (10 mg Oral Given 12/13/16 1013)  oxyCODONE-acetaminophen (PERCOCET/ROXICET) 5-325 MG per tablet 2 tablet (2 tablets Oral Given 12/13/16 1013)  ondansetron (ZOFRAN) tablet 4 mg (4 mg Oral Given 12/13/16 1014)     Initial Impression /  Assessment and Plan / ED Course  I have reviewed the triage vital signs and the nursing notes.  Pertinent labs & imaging results that were available during my care of the patient were reviewed by me and considered in my medical decision making (see chart for details).       Final Clinical Impressions(s) / ED Diagnoses MDM Patient has a history of arthritis degenerative disc disease of the lower back. CT scan of the lumbar spine shows no acute bony injury. The patient has some aortic atherosclerosis present. There is mild degenerative disc disease with disc height loss at the L2-L3 and L3-L4 area. There is no significant central canal stenosis appreciated. There is a bulging discs noted at the L3-L4 area and the L4-L5 area.  X-ray of the pelvis shows osteoarthritic changes in the hips bilaterally, but no bony abnormality.  No gross neurologic deficit appreciated on examination recheck. No vascular changes. I suspect the patient has a muscle strain aggravated by the known degenerative disc disease and arthritis. Patient will have muscle relaxer added to her current medication. I've asked her to see her primary physician Dr. Willey Blade for additional evaluation and management. Patient is in agreement with this plan.   Final diagnoses:  Strain of lumbar region, initial encounter  DDD (degenerative disc disease), lumbar  Arthritis of both hips    New Prescriptions Discharge Medication List as of 12/13/2016 11:50 AM    START taking these medications   Details  cyclobenzaprine (FLEXERIL) 10 MG tablet Take 1 tablet (10 mg total) by mouth 3 (three) times daily., Starting Tue 12/13/2016, Print         Lily Kocher, PA-C 12/13/16 2136    Nat Christen, MD 12/14/16 [phone]

## 2017-01-24 ENCOUNTER — Ambulatory Visit: Payer: Medicaid Other | Admitting: Cardiology

## 2017-01-24 NOTE — Progress Notes (Deleted)
Clinical Summary [name] is a 56 y.o.female  1. Chest pain - 05/2016 nuclear stress: no ischemia, LVEF 81%. Low risk - can still have chest pressure at times. Can be positional. Can be tender to palpation. Lasts about 20-30 minutes - prior heart cath in 2005-2006.   - still with chest pain at times. Atypical, tender to palpation.  - seen 10/2016 in ER with atypical chest pain, no evidence of cardiac cause by workup. CT PE was negative, trop neg, EKG reviewed and shows SR without ischemic changes   2. Palpitations - reports fluttering feeling, lightheaded - normal thyroid she reports 3 months ago - has not interested in beta blocker.  - recent event monitor with occasional ectopy, short run of SVT. She was not interested in starting medical therapy   Past Medical History:  Diagnosis Date  . Anxiety   . Bipolar disorder ([place])   . Depression   . Diabetes mellitus   . Elevated cholesterol   . Fibromyalgia   . GERD (gastroesophageal reflux disease)   . Migraine   . PTSD (post-traumatic stress disorder)    after death of son (accidental overdose)  . Thyroid disease      No Known Allergies   Current Outpatient Medications  Medication Sig Dispense Refill  . aspirin EC 81 MG tablet Take 1 tablet (81 mg total) by mouth daily.    Marland Kitchen buPROPion (WELLBUTRIN SR) 100 MG 12 hr tablet Take 100 mg by mouth daily.    . canagliflozin (INVOKANA) 300 MG TABS tablet Take 300 mg by mouth daily. 30 tablet   . clonazePAM (KLONOPIN) 0.5 MG tablet Take 0.5 mg by mouth 3 (three) times daily.    . cyclobenzaprine (FLEXERIL) 10 MG tablet Take 1 tablet (10 mg total) by mouth 3 (three) times daily. 20 tablet 0  . Dulaglutide (TRULICITY) 1.5 JE/5.6DJ SOPN Inject 0.5 mLs into the skin once a week.    . gabapentin (NEURONTIN) 800 MG tablet Take 800 mg by mouth 4 (four) times daily.     Marland Kitchen glipiZIDE (GLUCOTROL XL) 5 MG 24 hr tablet Take 5 mg by mouth daily with breakfast.    . levothyroxine  (SYNTHROID, LEVOTHROID) 150 MCG tablet Take 150 mcg by mouth daily before breakfast.    . metFORMIN (GLUCOPHAGE) 500 MG tablet Take 2 tablets (1,000 mg total) by mouth 2 (two) times daily.    . methocarbamol (ROBAXIN) 500 MG tablet Take 1 tablet (500 mg total) by mouth 4 (four) times daily. (Patient taking differently: Take 500-1,000 mg by mouth daily as needed for muscle spasms. ) 90 tablet 5  . Oxycodone HCl 10 MG TABS Take 10 mg by mouth 3 (three) times daily as needed (for pain).     . predniSONE (DELTASONE) 20 MG tablet Take 2 tablets (40 mg total) by mouth daily with breakfast. For the next four days (Patient not taking: Reported on 12/13/2016) 8 tablet 0  . QUEtiapine (SEROQUEL) 100 MG tablet Take 200 mg by mouth at bedtime.     . simvastatin (ZOCOR) 40 MG tablet Take 1 tablet (40 mg total) by mouth daily. (Patient taking differently: Take 40 mg by mouth daily at 6 PM. ) 30 tablet   . SUMAtriptan (IMITREX) 100 MG tablet One tablet po prn migraine.  May repeat in 2 hrs if needed.  Do not exceed 200 mg per 24 hrs. (Patient taking differently: Take 100 mg by mouth every 2 (two) hours as needed for migraine or  headache. May repeat in 2 hrs if needed.  Do not exceed 200 mg per 24 hrs.) 5 tablet 0  . topiramate (TOPAMAX) 100 MG tablet Take 1 tablet (100 mg total) by mouth at bedtime. (Patient taking differently: Take 150 mg by mouth at bedtime. )     No current facility-administered medications for this visit.      Past Surgical History:  Procedure Laterality Date  . CARPAL TUNNEL RELEASE  bilateral  . CHOLECYSTECTOMY    . gallbladder    . KNEE ARTHROSCOPY     bilateral  . SHOULDER SURGERY  left  . VAGINAL HYSTERECTOMY       No Known Allergies    Family History  Problem Relation Age of Onset  . Cancer Unknown   . Diabetes Unknown   . Heart disease Unknown   . Arthritis Unknown   . Stroke Mother   . Anxiety disorder Mother   . Heart failure Mother   . Cancer - Lung Father   .  Lung disease Father   . Heart failure Sister   . Lung disease Unknown   . Heart disease Maternal Grandfather   . Stroke Paternal Grandmother   . Colon cancer Neg Hx      Social History [name] reports that she has been smoking cigarettes.  She has a 26.00 pack-year smoking history. she has never used smokeless tobacco. [name] reports that she does not drink alcohol.   Review of Systems CONSTITUTIONAL: No weight loss, fever, chills, weakness or fatigue.  HEENT: Eyes: No visual loss, blurred vision, double vision or yellow sclerae.No hearing loss, sneezing, congestion, runny nose or sore throat.  SKIN: No rash or itching.  CARDIOVASCULAR:  RESPIRATORY: No shortness of breath, cough or sputum.  GASTROINTESTINAL: No anorexia, nausea, vomiting or diarrhea. No abdominal pain or blood.  GENITOURINARY: No burning on urination, no polyuria NEUROLOGICAL: No headache, dizziness, syncope, paralysis, ataxia, numbness or tingling in the extremities. No change in bowel or bladder control.  MUSCULOSKELETAL: No muscle, back pain, joint pain or stiffness.  LYMPHATICS: No enlarged nodes. No history of splenectomy.  PSYCHIATRIC: No history of depression or anxiety.  ENDOCRINOLOGIC: No reports of sweating, cold or heat intolerance. No polyuria or polydipsia.  Marland Kitchen   Physical Examination There were no vitals filed for this visit. There were no vitals filed for this visit.  Gen: resting comfortably, no acute distress HEENT: no scleral icterus, pupils equal round and reactive, no palptable cervical adenopathy,  CV Resp: Clear to auscultation bilaterally GI: abdomen is soft, non-tender, non-distended, normal bowel sounds, no hepatosplenomegaly MSK: extremities are warm, no edema.  Skin: warm, no rash Neuro:  no focal deficits Psych: appropriate affect   Diagnostic Studies 05/2016 echo Study Conclusions  - Left ventricle: The cavity size was normal. Wall thickness was increased in a  pattern of mild LVH. Systolic function was normal. The estimated ejection fraction was in the range of 60% to 65%. Wall motion was normal; there were no regional wall motion abnormalities. Doppler parameters are consistent with abnormal left ventricular relaxation (grade 1 diastolic dysfunction). - Aortic valve: Mildly calcified annulus. Trileaflet. - Mitral valve: Calcified annulus. There was trivial regurgitation. - Right atrium: Central venous pressure (est): 3 mm Hg. - Atrial septum: No defect or patent foramen ovale was identified. - Tricuspid valve: There was trivial regurgitation. - Pulmonary arteries: Systolic pressure could not be accurately estimated. - Pericardium, extracardiac: A prominent pericardial fat pad was present.  Impressions:  - Mild  LVH with LVEF 60-65% and grade 1 diastolic dysfunction. Mildly calcified mitral and aortic annulus. Trivial mitral regurgitation. Trivial tricuspid regurgitation. Prominent pericardial fat pad.  05/2016 nuclear stress  No diagnostic ST segment changes to indicate ischemia.  Small, mild intensity, fixed apical anteroseptal defect that is most consistent with breast attenuation.  This is a low risk study.  Nuclear stress EF: 81%.  05/2016 Event monitor  Telemetry tracings show sinus rhythm and sinus tachycardia  One short episode of probable SVT. Some mild R-R irregularity, not overall consistent with afib  Symptoms correlated with sinus rhythm and sinus tachycardia. One short     Assessment and Plan   1. Chest pain - atypical chest pain, negative stress test - continue to monitor  2. Palpitations - event monitor with benign ectopy, short run of SVT. - she is not intrested in medical therapy at this time, symptoms tolerable      Arnoldo Lenis, M.D., F.A.C.C.

## 2017-02-08 ENCOUNTER — Ambulatory Visit: Payer: Medicaid Other | Admitting: Cardiology

## 2017-02-12 NOTE — Progress Notes (Signed)
Cardiology Office Note   Date:  02/13/2017   ID:  Tonya, Hawkins 20-Nov-1960, MRN [id number]  PCP:  Asencion Noble, MD  Cardiologist:  Carlyle Dolly, MD   History of Present Illness: [name] is a 56 y.o. female who presents for ongoing assessment and management of atypical chest pain, palpitations, with other history to include Type II Diabetes, Hypercholesterolemia, GERD, Fibromyalgia, and Bipoloar disorder with depression and anxiety.   Comes today without any new cardiac complaints.  She has been medically compliant.  Is followed by her primary care and is due for labs to be drawn tomorrow.  She denies any racing heart rate, palpitations, chest pain, or dyspnea on exertion.  She is requesting advisement on quitting smoking.  She had tried Chantix in the past and states that that did not work well for her causing her to have significant hallucinations and "feeling crazy".  Past Medical History:  Diagnosis Date  . Anxiety   . Bipolar disorder ([place])   . Depression   . Diabetes mellitus   . Elevated cholesterol   . Fibromyalgia   . GERD (gastroesophageal reflux disease)   . Migraine   . PTSD (post-traumatic stress disorder)    after death of son (accidental overdose)  . Thyroid disease     Past Surgical History:  Procedure Laterality Date  . CARPAL TUNNEL RELEASE  bilateral  . CHOLECYSTECTOMY    . COLONOSCOPY WITH PROPOFOL N/A 05/01/2012   WUJ:[id number] COLON polyps/Mild diverticulosis was noted in the sigmoid colon/Moderate sized internal hemorrhoids. path with tubular adenoma  . gallbladder    . KNEE ARTHROSCOPY     bilateral  . KNEE ARTHROSCOPY WITH MEDIAL MENISECTOMY Right 10/29/2015   Procedure: ARTHROSCOPY RIGHT KNEE  WITH PARTIAL MEDIAL MENISECTOMY;  Surgeon: Carole Civil, MD;  Location: [facility];  Service: Orthopedics;  Laterality: Right;  . POLYPECTOMY N/A 05/01/2012   Procedure: POLYPECTOMY;  Surgeon: Danie Binder, MD;  Location: [facility];  Service:  Endoscopy;  Laterality: N/A;  . SHOULDER SURGERY  left  . VAGINAL HYSTERECTOMY       Current Outpatient Medications  Medication Sig Dispense Refill  . aspirin EC 81 MG tablet Take 1 tablet (81 mg total) by mouth daily.    Marland Kitchen buPROPion (WELLBUTRIN SR) 100 MG 12 hr tablet Take 100 mg by mouth daily.    . canagliflozin (INVOKANA) 300 MG TABS tablet Take 300 mg by mouth daily. 30 tablet   . clonazePAM (KLONOPIN) 0.5 MG tablet Take 0.5 mg by mouth 3 (three) times daily.    . cyclobenzaprine (FLEXERIL) 10 MG tablet Take 1 tablet (10 mg total) by mouth 3 (three) times daily. 20 tablet 0  . Dulaglutide (TRULICITY) 1.5 YN/8.2NF SOPN Inject 0.5 mLs into the skin once a week.    . gabapentin (NEURONTIN) 800 MG tablet Take 800 mg by mouth 4 (four) times daily.     Marland Kitchen glipiZIDE (GLUCOTROL XL) 5 MG 24 hr tablet Take 5 mg by mouth daily with breakfast.    . levothyroxine (SYNTHROID, LEVOTHROID) 150 MCG tablet Take 150 mcg by mouth daily before breakfast.    . metFORMIN (GLUCOPHAGE) 500 MG tablet Take 2 tablets (1,000 mg total) by mouth 2 (two) times daily.    . methocarbamol (ROBAXIN) 500 MG tablet Take 1 tablet (500 mg total) by mouth 4 (four) times daily. (Patient taking differently: Take 500-1,000 mg by mouth daily as needed for muscle spasms. ) 90 tablet 5  . Oxycodone HCl 10  MG TABS Take 10 mg by mouth 3 (three) times daily as needed (for pain).     . predniSONE (DELTASONE) 20 MG tablet Take 2 tablets (40 mg total) by mouth daily with breakfast. For the next four days 8 tablet 0  . QUEtiapine (SEROQUEL) 100 MG tablet Take 200 mg by mouth at bedtime.     . simvastatin (ZOCOR) 40 MG tablet Take 1 tablet (40 mg total) by mouth daily. (Patient taking differently: Take 40 mg by mouth daily at 6 PM. ) 30 tablet   . SUMAtriptan (IMITREX) 100 MG tablet One tablet po prn migraine.  May repeat in 2 hrs if needed.  Do not exceed 200 mg per 24 hrs. (Patient taking differently: Take 100 mg by mouth every 2 (two) hours  as needed for migraine or headache. May repeat in 2 hrs if needed.  Do not exceed 200 mg per 24 hrs.) 5 tablet 0  . topiramate (TOPAMAX) 100 MG tablet Take 1 tablet (100 mg total) by mouth at bedtime. (Patient taking differently: Take 150 mg by mouth at bedtime. )     No current facility-administered medications for this visit.     Allergies:   Patient has no known allergies.    Social History:  The patient  reports that she has been smoking cigarettes.  She has a 26.00 pack-year smoking history. she has never used smokeless tobacco. She reports that she does not drink alcohol or use drugs.   Family History:  The patient's family history includes Anxiety disorder in her mother; Arthritis in her unknown relative; Cancer in her unknown relative; Cancer - Lung in her father; Diabetes in her unknown relative; Heart disease in her maternal grandfather and unknown relative; Heart failure in her mother and sister; Lung disease in her father and unknown relative; Stroke in her mother and paternal grandmother.    ROS: All other systems are reviewed and negative. Unless otherwise mentioned in H&P    PHYSICAL EXAM: VS:  BP 128/74 (BP Location: Right Arm)   Pulse 89   Ht 5\' 6"  (1.676 m)   Wt 206 lb (93.4 kg)   SpO2 98%   BMI 33.25 kg/m  , BMI Body mass index is 33.25 kg/m. GEN: Well nourished, well developed, in no acute distress  HEENT: normal  Neck: no JVD, carotid bruits, or masses Cardiac:RRR; no murmurs, rubs, or gallops,no edema  Respiratory:  Clear to auscultation bilaterally, normal work of breathing GI: soft, nontender, nondistended, + BS MS: no deformity or atrophy  Skin: warm and dry, no rash Neuro:  Strength and sensation are intact Psych: euthymic mood, full affect  Recent Labs: 10/27/2016: ALT 58; B Natriuretic Peptide 12.0; BUN 21; Creatinine, Ser 0.78; Hemoglobin 16.7; Magnesium 1.9; Platelets 261; Potassium 3.9; Sodium 131    Lipid Panel No results found for: CHOL, TRIG,  HDL, CHOLHDL, VLDL, LDLCALC, LDLDIRECT    Wt Readings from Last 3 Encounters:  02/13/17 206 lb (93.4 kg)  12/13/16 214 lb (97.1 kg)  10/27/16 213 lb (96.6 kg)      Other studies Reviewed: 05/2016 echo Study Conclusions  - Left ventricle: The cavity size was normal. Wall thickness was increased in a pattern of mild LVH. Systolic function was normal. The estimated ejection fraction was in the range of 60% to 65%. Wall motion was normal; there were no regional wall motion abnormalities. Doppler parameters are consistent with abnormal left ventricular relaxation (grade 1 diastolic dysfunction). - Aortic valve: Mildly calcified annulus. Trileaflet. -  Mitral valve: Calcified annulus. There was trivial regurgitation. - Right atrium: Central venous pressure (est): 3 mm Hg. - Atrial septum: No defect or patent foramen ovale was identified. - Tricuspid valve: There was trivial regurgitation. - Pulmonary arteries: Systolic pressure could not be accurately estimated. - Pericardium, extracardiac: A prominent pericardial fat pad was present.  Impressions:  - Mild LVH with LVEF 60-65% and grade 1 diastolic dysfunction. Mildly calcified mitral and aortic annulus. Trivial mitral regurgitation. Trivial tricuspid regurgitation. Prominent pericardial fat pad.  05/2016 nuclear stress  No diagnostic ST segment changes to indicate ischemia.  Small, mild intensity, fixed apical anteroseptal defect that is most consistent with breast attenuation.  This is a low risk study.  Nuclear stress EF: 81%.  05/2016 Event monitor  Telemetry tracings show sinus rhythm and sinus tachycardia  One short episode of probable SVT. Some mild R-R irregularity, not overall consistent with afib  Symptoms correlated with sinus rhythm and sinus tachycardia. One short    ASSESSMENT AND PLAN:  1.  Atypical chest pain: Patient is doing well.  No recurrence of discomfort.  I have  reviewed her most recent stress test and echocardiogram.  Not make any changes in her medications.  She will continue statin therapy and aspirin.  Labs are going to be drawn by her primary care physician in the a.m., I am requesting copies.  2.  Ongoing tobacco abuse: Patient is already on Wellbutrin SR.  I have advised her to speak with her psychiatrist about increasing the dose or adjusting the dose as this is been known to help with smoking cessation.  She is also willing to try nicotine gum.  She refuses Chantix.  3.  Hypercholesterolemia: Continue statin therapy.  Labs are being drawn in the a.m. for primary care.  Copies are being requested.  Current medicines are reviewed at length with the patient today.    Labs/ tests ordered today include: none  Phill Myron. West Pugh, ANP, AACC   02/13/2017 3:04 PM    [facility] [address], [place], [place] [address] Phone: [phone]; Fax: [phone]

## 2017-02-13 ENCOUNTER — Encounter: Payer: Self-pay | Admitting: Adult Health

## 2017-02-13 ENCOUNTER — Ambulatory Visit: Payer: Medicaid Other | Admitting: Adult Health

## 2017-02-13 VITALS — BP 128/74 | HR 89 | Ht 66.0 in | Wt 206.0 lb

## 2017-02-13 DIAGNOSIS — E7800 Pure hypercholesterolemia, unspecified: Secondary | ICD-10-CM

## 2017-02-13 DIAGNOSIS — Z720 Tobacco use: Secondary | ICD-10-CM | POA: Diagnosis not present

## 2017-02-13 DIAGNOSIS — R079 Chest pain, unspecified: Secondary | ICD-10-CM

## 2017-02-13 NOTE — Patient Instructions (Signed)
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
## 2017-04-03 ENCOUNTER — Encounter (HOSPITAL_COMMUNITY): Payer: Self-pay | Admitting: Psychiatry

## 2017-04-03 ENCOUNTER — Encounter (INDEPENDENT_AMBULATORY_CARE_PROVIDER_SITE_OTHER): Payer: Self-pay

## 2017-04-03 ENCOUNTER — Ambulatory Visit (INDEPENDENT_AMBULATORY_CARE_PROVIDER_SITE_OTHER): Payer: Medicaid Other | Admitting: Psychiatry

## 2017-04-03 VITALS — BP 123/75 | HR 86 | Ht 66.0 in | Wt 206.0 lb

## 2017-04-03 DIAGNOSIS — Z560 Unemployment, unspecified: Secondary | ICD-10-CM | POA: Diagnosis not present

## 2017-04-03 DIAGNOSIS — F17210 Nicotine dependence, cigarettes, uncomplicated: Secondary | ICD-10-CM

## 2017-04-03 DIAGNOSIS — F4010 Social phobia, unspecified: Secondary | ICD-10-CM

## 2017-04-03 DIAGNOSIS — Z818 Family history of other mental and behavioral disorders: Secondary | ICD-10-CM

## 2017-04-03 DIAGNOSIS — Z634 Disappearance and death of family member: Secondary | ICD-10-CM | POA: Diagnosis not present

## 2017-04-03 DIAGNOSIS — Z62810 Personal history of physical and sexual abuse in childhood: Secondary | ICD-10-CM | POA: Diagnosis not present

## 2017-04-03 DIAGNOSIS — R450 Nervousness: Secondary | ICD-10-CM

## 2017-04-03 DIAGNOSIS — F0630 Mood disorder due to known physiological condition, unspecified: Secondary | ICD-10-CM | POA: Insufficient documentation

## 2017-04-03 DIAGNOSIS — F410 Panic disorder [episodic paroxysmal anxiety] without agoraphobia: Secondary | ICD-10-CM

## 2017-04-03 DIAGNOSIS — Z811 Family history of alcohol abuse and dependence: Secondary | ICD-10-CM

## 2017-04-03 DIAGNOSIS — F4310 Post-traumatic stress disorder, unspecified: Secondary | ICD-10-CM

## 2017-04-03 DIAGNOSIS — F419 Anxiety disorder, unspecified: Secondary | ICD-10-CM

## 2017-04-03 DIAGNOSIS — R45851 Suicidal ideations: Secondary | ICD-10-CM

## 2017-04-03 MED ORDER — METHYLPHENIDATE HCL ER 18 MG PO TB24: 18.0000 mg | ORAL_TABLET | Freq: Every day | ORAL | 0 refills | Status: DC

## 2017-04-03 MED ORDER — ZIPRASIDONE HCL 20 MG PO CAPS: 20.0000 mg | ORAL_CAPSULE | Freq: Two times a day (BID) | ORAL | 0 refills | Status: DC

## 2017-04-03 MED ORDER — CLONAZEPAM 0.5 MG PO TABS: 0.5000 mg | ORAL_TABLET | Freq: Two times a day (BID) | ORAL | 0 refills | Status: DC | PRN

## 2017-04-03 MED ORDER — TRAZODONE HCL 100 MG PO TABS: ORAL_TABLET | ORAL | 0 refills | Status: DC

## 2017-04-03 NOTE — Patient Instructions (Signed)
1. Continue Wellbutrin 100 mg twice a day  2. Start Geodon 20 mg BID 3. Discontinue Ritalin, Quetiapine 4. Start Concerta 18 mg daily 5. Continue clonazepam 0.5 mg twice a day (Refill after 1/21) 6. Start Trazodone 50-100 mg at night as needed for sleep 7. Return to clinic in one month for 30 mins 8. Referral to therapy

## 2017-04-18 ENCOUNTER — Ambulatory Visit (HOSPITAL_COMMUNITY): Payer: Self-pay | Admitting: Psychiatry

## 2017-04-18 ENCOUNTER — Telehealth (HOSPITAL_COMMUNITY): Payer: Self-pay | Admitting: *Deleted

## 2017-04-18 ENCOUNTER — Telehealth (HOSPITAL_COMMUNITY): Payer: Self-pay | Admitting: Psychiatry

## 2017-04-18 NOTE — Telephone Encounter (Signed)
Would not prescribe Adderall this time. She may discontinue concerta if she is concerned about smoking. Will need evaluation before prescribing other medication (whether at her next appointment or reschedule to earlier appointment).

## 2017-04-18 NOTE — Telephone Encounter (Signed)
Dr Modesta Messing, Patient called stating that the Concerta has caused her to increase smoking she now smokes 2-2.5 packs.  She requested if she could try Adderall? Previously tried Ritalin didn't work. # [phone]

## 2017-04-19 NOTE — Telephone Encounter (Signed)
Spoke & informed patient that per Dr Hisada:Would not prescribe Adderall this time. She may discontinue concerta if she is concerned about smoking. Will need evaluation before prescribing other medication (whether at her next appointment or reschedule to earlier appointment).    Patient stated that she could wait until her next appointment to discuss other medication possibilities

## 2017-04-24 ENCOUNTER — Encounter (HOSPITAL_COMMUNITY): Payer: Self-pay | Admitting: Psychiatry

## 2017-04-24 ENCOUNTER — Ambulatory Visit (HOSPITAL_COMMUNITY): Payer: Medicaid Other | Admitting: Psychiatry

## 2017-04-24 VITALS — BP 108/68 | HR 87 | Ht 66.0 in | Wt 205.0 lb

## 2017-04-24 DIAGNOSIS — K219 Gastro-esophageal reflux disease without esophagitis: Secondary | ICD-10-CM

## 2017-04-24 DIAGNOSIS — Z811 Family history of alcohol abuse and dependence: Secondary | ICD-10-CM

## 2017-04-24 DIAGNOSIS — F4310 Post-traumatic stress disorder, unspecified: Secondary | ICD-10-CM | POA: Diagnosis not present

## 2017-04-24 DIAGNOSIS — Z79899 Other long term (current) drug therapy: Secondary | ICD-10-CM

## 2017-04-24 DIAGNOSIS — F319 Bipolar disorder, unspecified: Secondary | ICD-10-CM | POA: Diagnosis not present

## 2017-04-24 DIAGNOSIS — E039 Hypothyroidism, unspecified: Secondary | ICD-10-CM

## 2017-04-24 DIAGNOSIS — Z818 Family history of other mental and behavioral disorders: Secondary | ICD-10-CM | POA: Diagnosis not present

## 2017-04-24 DIAGNOSIS — E119 Type 2 diabetes mellitus without complications: Secondary | ICD-10-CM | POA: Diagnosis not present

## 2017-04-24 DIAGNOSIS — M797 Fibromyalgia: Secondary | ICD-10-CM | POA: Diagnosis not present

## 2017-04-24 DIAGNOSIS — F0630 Mood disorder due to known physiological condition, unspecified: Secondary | ICD-10-CM | POA: Diagnosis not present

## 2017-04-24 DIAGNOSIS — F909 Attention-deficit hyperactivity disorder, unspecified type: Secondary | ICD-10-CM

## 2017-04-24 DIAGNOSIS — Z62810 Personal history of physical and sexual abuse in childhood: Secondary | ICD-10-CM

## 2017-04-24 DIAGNOSIS — E7800 Pure hypercholesterolemia, unspecified: Secondary | ICD-10-CM

## 2017-04-24 DIAGNOSIS — F17210 Nicotine dependence, cigarettes, uncomplicated: Secondary | ICD-10-CM | POA: Diagnosis not present

## 2017-04-24 DIAGNOSIS — M19049 Primary osteoarthritis, unspecified hand: Secondary | ICD-10-CM

## 2017-04-24 DIAGNOSIS — G43909 Migraine, unspecified, not intractable, without status migrainosus: Secondary | ICD-10-CM

## 2017-04-24 MED ORDER — ARIPIPRAZOLE 2 MG PO TABS: 2.0000 mg | ORAL_TABLET | Freq: Every day | ORAL | 0 refills | Status: DC

## 2017-04-24 MED ORDER — TRAZODONE HCL 100 MG PO TABS: 200.0000 mg | ORAL_TABLET | Freq: Every day | ORAL | 0 refills | Status: DC

## 2017-04-24 MED ORDER — AMPHETAMINE-DEXTROAMPHET ER 10 MG PO CP24: 10.0000 mg | ORAL_CAPSULE | Freq: Every day | ORAL | 0 refills | Status: DC
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
## 2017-04-24 NOTE — Patient Instructions (Signed)
1. Continue Wellbutrin 100 mg twice a day  2. Discontinue Geodon 3. Start Abilify 2 mg daily  4. Start Adderall XR 10 mg daily  5. Discontinue Concerta 6. Continue clonazepam 0.5 mg twice a day for anxiety (Refill after 1/21) 7. Discontinue Trazodone 8. Return to clinic in one month for 30 mins

## 2017-04-27 ENCOUNTER — Other Ambulatory Visit (HOSPITAL_COMMUNITY): Payer: Self-pay | Admitting: Psychiatry

## 2017-04-27 ENCOUNTER — Telehealth (HOSPITAL_COMMUNITY): Payer: Self-pay | Admitting: *Deleted

## 2017-04-27 ENCOUNTER — Encounter (HOSPITAL_COMMUNITY): Payer: Self-pay | Admitting: Psychiatry

## 2017-04-27 MED ORDER — QUETIAPINE FUMARATE 25 MG PO TABS: 25.0000 mg | ORAL_TABLET | Freq: Every day | ORAL | 0 refills | Status: DC

## 2017-04-27 MED ORDER — QUETIAPINE FUMARATE 200 MG PO TABS: 200.0000 mg | ORAL_TABLET | Freq: Every day | ORAL | 0 refills | Status: DC

## 2017-04-27 NOTE — Telephone Encounter (Signed)
I do not feel comfortable prescribing seroquel as she is now on Abilify, which is in the same category of medication as quetaipine. Would recommend to stay on current medication to see if it starts working. Please discuss sleep hygiene (taking a walk and get some sunlight during the day if able, avoid taking naps more than 30 mins).

## 2017-04-27 NOTE — Telephone Encounter (Signed)
Inform the patient to first try with quetiapine 100 mg at night as it may cause significant drowsiness given patient has not been taking this medication for a while. She can take up to 200 mg at night if the lower dose does not work/no side effect.

## 2017-04-27 NOTE — Telephone Encounter (Signed)
Dr Modesta Messing Patient called & stated that previously before seeing you you she was on 200 mg Seroquel  And that she has chronic sleep issues & that the 25 mg Seroquel will not help her She tearfully said if she doesn't get any sleep she's going to go insane # [phone] (H)

## 2017-04-27 NOTE — Telephone Encounter (Signed)
Ordered quetiapine 25 mg at night. Remind her of potential side effect- elevated blood sugar, lipid, and weight gain. Advise her to discontinue abilify and trazodone.

## 2017-04-27 NOTE — Telephone Encounter (Signed)
Attempting call to inform per Dr Modesta Messing: I do not feel comfortable prescribing seroquel as she is now on Abilify, which is in the same category of medication as quetaipine. Would recommend to stay on current medication to see if it starts working. Please discuss sleep hygiene (taking a walk and get some sunlight during the day if able, avoid taking naps more than 30 mins).

## 2017-04-27 NOTE — Telephone Encounter (Signed)
Spoke with patient & she "stated that she is exhausted & that she can't keep doing this". She has requested to come off the Abilify & Trazodone for the Seroquel, so that she can rest. # [phone]

## 2017-04-27 NOTE — Telephone Encounter (Signed)
Dr Modesta Messing Patient called ststing that she has tried to continue taking the Trazodone as instructed. And says  it isn't working waking up constantly @ 4:30 am not able to go back to sleep. Requesting to go   back on Seroquel " stated not worry about elevated BP issues"

## 2017-04-27 NOTE — Telephone Encounter (Signed)
Patient has been notified per Dr Modesta Messing: Inform the patient to first try with quetiapine 100 mg at night as it may cause significant drowsiness given patient has not been taking this medication for a while. She can take up to 200 mg at night if the lower dose does not work/no side effect.   Patient is very pleased & Thankful

## 2017-04-27 NOTE — Telephone Encounter (Signed)
Spoke with & notified patient that per Dr Modesta Messing: Ordered quetiapine 25 mg at night. Remind her of potential side effect- elevated blood sugar, lipid, and weight gain. Advise her to discontinue abilify and trazodone

## 2017-04-27 NOTE — Telephone Encounter (Signed)
This encounter was created in error - please disregard.  This encounter was created in error - please disregard.

## 2017-05-04 ENCOUNTER — Ambulatory Visit (HOSPITAL_COMMUNITY): Payer: Self-pay | Admitting: Psychiatry

## 2017-05-08 ENCOUNTER — Ambulatory Visit (HOSPITAL_COMMUNITY): Payer: Self-pay | Admitting: Psychiatry

## 2017-05-15 ENCOUNTER — Other Ambulatory Visit (HOSPITAL_COMMUNITY): Payer: Self-pay | Admitting: Psychiatry

## 2017-05-15 ENCOUNTER — Telehealth (HOSPITAL_COMMUNITY): Payer: Self-pay | Admitting: *Deleted

## 2017-05-15 MED ORDER — CLONAZEPAM 0.5 MG PO TABS: 0.5000 mg | ORAL_TABLET | Freq: Two times a day (BID) | ORAL | 0 refills | Status: DC | PRN

## 2017-05-15 NOTE — Telephone Encounter (Signed)
Please contact the pharmacy- there should be an order on 1/14. (Per PMP, she dispensed this medication ordered from [facility], but not under my name.)

## 2017-05-15 NOTE — Telephone Encounter (Signed)
Dr. Hisada's pt 

## 2017-05-15 NOTE — Telephone Encounter (Signed)
Patient called in Requesting refill on Clonazepam. Next appointment is 05/24/17

## 2017-05-15 NOTE — Telephone Encounter (Signed)
ordered

## 2017-05-15 NOTE — Telephone Encounter (Signed)
Dr Modesta Messing Per pharmacy there are no refills on Clonzepam

## 2017-05-17 ENCOUNTER — Ambulatory Visit (HOSPITAL_COMMUNITY): Payer: Medicaid Other | Admitting: Psychiatry

## 2017-05-17 ENCOUNTER — Encounter (HOSPITAL_COMMUNITY): Payer: Self-pay | Admitting: Psychiatry

## 2017-05-17 DIAGNOSIS — F4310 Post-traumatic stress disorder, unspecified: Secondary | ICD-10-CM | POA: Diagnosis not present

## 2017-05-17 DIAGNOSIS — Z62810 Personal history of physical and sexual abuse in childhood: Secondary | ICD-10-CM | POA: Diagnosis not present
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
## 2017-05-24 ENCOUNTER — Ambulatory Visit (HOSPITAL_COMMUNITY): Payer: Medicaid Other | Admitting: Psychiatry

## 2017-05-24 ENCOUNTER — Encounter (HOSPITAL_COMMUNITY): Payer: Self-pay | Admitting: Psychiatry

## 2017-05-24 VITALS — BP 104/69 | HR 85 | Ht 66.0 in | Wt 204.0 lb

## 2017-05-24 DIAGNOSIS — F4310 Post-traumatic stress disorder, unspecified: Secondary | ICD-10-CM

## 2017-05-24 DIAGNOSIS — Z560 Unemployment, unspecified: Secondary | ICD-10-CM

## 2017-05-24 DIAGNOSIS — Z62810 Personal history of physical and sexual abuse in childhood: Secondary | ICD-10-CM | POA: Diagnosis not present

## 2017-05-24 DIAGNOSIS — F0630 Mood disorder due to known physiological condition, unspecified: Secondary | ICD-10-CM

## 2017-05-24 DIAGNOSIS — F419 Anxiety disorder, unspecified: Secondary | ICD-10-CM | POA: Diagnosis not present

## 2017-05-24 DIAGNOSIS — Z813 Family history of other psychoactive substance abuse and dependence: Secondary | ICD-10-CM | POA: Diagnosis not present

## 2017-05-24 DIAGNOSIS — R450 Nervousness: Secondary | ICD-10-CM

## 2017-05-24 DIAGNOSIS — F17210 Nicotine dependence, cigarettes, uncomplicated: Secondary | ICD-10-CM | POA: Diagnosis not present

## 2017-05-24 DIAGNOSIS — Z818 Family history of other mental and behavioral disorders: Secondary | ICD-10-CM | POA: Diagnosis not present

## 2017-05-24 DIAGNOSIS — G4700 Insomnia, unspecified: Secondary | ICD-10-CM | POA: Diagnosis not present

## 2017-05-24 DIAGNOSIS — Z811 Family history of alcohol abuse and dependence: Secondary | ICD-10-CM

## 2017-05-24 MED ORDER — CLONAZEPAM 0.5 MG PO TABS: 0.5000 mg | ORAL_TABLET | Freq: Two times a day (BID) | ORAL | 0 refills | Status: DC | PRN

## 2017-05-24 MED ORDER — AMPHETAMINE-DEXTROAMPHET ER 15 MG PO CP24: 15.0000 mg | ORAL_CAPSULE | ORAL | 0 refills | Status: DC

## 2017-05-24 MED ORDER — QUETIAPINE FUMARATE 200 MG PO TABS: 200.0000 mg | ORAL_TABLET | Freq: Every day | ORAL | 0 refills | Status: DC

## 2017-05-24 NOTE — Patient Instructions (Signed)
1. Continue Wellbutrin 100 mg twice a day  2. Quetiapine 200 mg at night  3. Increase Adderall XR 15 mg daily  4. Continue clonazepam 0.5 mg twice a day for anxiety (Refill after 3/20) 5. Return to clinic in one month for 30 mins

## 2017-06-01 ENCOUNTER — Other Ambulatory Visit: Payer: Self-pay | Admitting: Neurosurgery

## 2017-06-01 DIAGNOSIS — M5416 Radiculopathy, lumbar region: Secondary | ICD-10-CM

## 2017-06-02 ENCOUNTER — Telehealth (HOSPITAL_COMMUNITY): Payer: Self-pay | Admitting: Psychiatry

## 2017-06-02 ENCOUNTER — Telehealth (HOSPITAL_COMMUNITY): Payer: Self-pay

## 2017-06-02 NOTE — Telephone Encounter (Signed)
Does she mean Adderall XR 15 mg? I would like to hold the immediate release at this time given its potential risk. Will discuss with Deatra Canter to see if we can help with this.

## 2017-06-02 NOTE — Telephone Encounter (Signed)
The patient insurance does not cover Adderall XR. Could you help with this? She has tried Adderall, Ritalin,Vyvanse, concerta in the past.

## 2017-06-02 NOTE — Telephone Encounter (Signed)
Patient called and stated that her insurance doesn't cover the Adderall XR 7.5 but it does cover Adderall 7.5. Patient wants to know can she get a new prescription for Adderall 7.5 so her insurance can pay for the cost of the medication. Please advise

## 2017-06-07 NOTE — Telephone Encounter (Signed)
Yes. Please update the situation with her that we have been trying to get it approved by insurance.   [name], I am wondering if you received a message about her. Could you help her to get Adderall XR? She has tried Adderall, Ritalin, Vyvanse, concerta (increased smoking).

## 2017-06-07 NOTE — Telephone Encounter (Signed)
Dr Modesta Messing Patient called front office left message requesting Aderrall. But I see a msg that you would like to hold due to potential risk. Is that correct? Next O.V. 07/11/17

## 2017-06-08 ENCOUNTER — Telehealth (HOSPITAL_COMMUNITY): Payer: Self-pay | Admitting: *Deleted

## 2017-06-08 NOTE — Telephone Encounter (Signed)
Called Medicaid to ask if prior authorization was needed for Adderall XR, was told Brand preferred. Called pharmacy and was told that patient now has [employer] as first insurance. Called [phone] was told that Prior authorization for Adderall XR is not required at this time per automated system.

## 2017-06-12 ENCOUNTER — Ambulatory Visit
Admission: RE | Admit: 2017-06-12 | Discharge: 2017-06-12 | Disposition: A | Discharge status: Home / Self Care | Payer: Medicare Other | Source: Ambulatory Visit | Attending: Neurosurgery | Admitting: Neurosurgery

## 2017-06-12 DIAGNOSIS — M5416 Radiculopathy, lumbar region: Secondary | ICD-10-CM

## 2017-06-23 ENCOUNTER — Other Ambulatory Visit (HOSPITAL_COMMUNITY): Payer: Self-pay | Admitting: Psychiatry

## 2017-06-23 MED ORDER — QUETIAPINE FUMARATE 200 MG PO TABS: 200.0000 mg | ORAL_TABLET | Freq: Every day | ORAL | 0 refills | Status: DC

## 2017-06-23 MED ORDER — AMPHETAMINE-DEXTROAMPHET ER 15 MG PO CP24: 15.0000 mg | ORAL_CAPSULE | ORAL | 0 refills | Status: DC

## 2017-06-23 NOTE — Telephone Encounter (Signed)
I have utilized the [place] Controlled Substances Reporting System (PMP AWARxE) to confirm adherence regarding the patient's medication. My review reveals appropriate prescription fills.  

## 2017-06-26 ENCOUNTER — Ambulatory Visit (HOSPITAL_COMMUNITY): Payer: Self-pay | Admitting: Psychiatry

## 2017-07-04 ENCOUNTER — Other Ambulatory Visit: Payer: Self-pay | Admitting: Orthopedic Surgery
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
## 2017-07-10 ENCOUNTER — Ambulatory Visit (HOSPITAL_COMMUNITY): Payer: Self-pay | Admitting: Psychiatry

## 2017-07-11 ENCOUNTER — Ambulatory Visit (INDEPENDENT_AMBULATORY_CARE_PROVIDER_SITE_OTHER): Payer: Medicare Other | Admitting: Psychiatry

## 2017-07-11 ENCOUNTER — Encounter (HOSPITAL_COMMUNITY): Payer: Self-pay | Admitting: Psychiatry

## 2017-07-11 VITALS — BP 108/74 | HR 86 | Ht 66.0 in | Wt 202.0 lb

## 2017-07-11 DIAGNOSIS — F0630 Mood disorder due to known physiological condition, unspecified: Secondary | ICD-10-CM | POA: Diagnosis not present

## 2017-07-11 DIAGNOSIS — Z818 Family history of other mental and behavioral disorders: Secondary | ICD-10-CM | POA: Diagnosis not present

## 2017-07-11 DIAGNOSIS — Z811 Family history of alcohol abuse and dependence: Secondary | ICD-10-CM | POA: Diagnosis not present

## 2017-07-11 DIAGNOSIS — Z813 Family history of other psychoactive substance abuse and dependence: Secondary | ICD-10-CM

## 2017-07-11 DIAGNOSIS — F17210 Nicotine dependence, cigarettes, uncomplicated: Secondary | ICD-10-CM

## 2017-07-11 DIAGNOSIS — R450 Nervousness: Secondary | ICD-10-CM

## 2017-07-11 DIAGNOSIS — F419 Anxiety disorder, unspecified: Secondary | ICD-10-CM

## 2017-07-11 DIAGNOSIS — F909 Attention-deficit hyperactivity disorder, unspecified type: Secondary | ICD-10-CM

## 2017-07-11 DIAGNOSIS — F4310 Post-traumatic stress disorder, unspecified: Secondary | ICD-10-CM | POA: Diagnosis not present

## 2017-07-11 MED ORDER — AMPHETAMINE-DEXTROAMPHET ER 15 MG PO CP24: 15.0000 mg | ORAL_CAPSULE | ORAL | 0 refills | Status: DC

## 2017-07-11 MED ORDER — CLONAZEPAM 0.5 MG PO TABS: 0.5000 mg | ORAL_TABLET | Freq: Two times a day (BID) | ORAL | 1 refills | Status: DC | PRN

## 2017-07-11 MED ORDER — AMPHETAMINE-DEXTROAMPHET ER 15 MG PO CP24
15.0000 mg | ORAL_CAPSULE | ORAL | 0 refills | Status: DC
Start: 2017-08-22 — End: 2017-08-28

## 2017-07-11 MED ORDER — BUPROPION HCL ER (SR) 100 MG PO TB12: 100.0000 mg | ORAL_TABLET | Freq: Every day | ORAL | 0 refills | Status: DC

## 2017-07-11 MED ORDER — QUETIAPINE FUMARATE 200 MG PO TABS: 200.0000 mg | ORAL_TABLET | Freq: Every day | ORAL | 0 refills | Status: DC

## 2017-07-11 NOTE — Patient Instructions (Signed)
1. Continue Wellbutrin 100 mg twice a day 2. Continue Quetiapine 200 mg at night  3. Continue Adderall XR 15 mg daily  4. Continue clonazepam 0.5 mg twice a day for anxiety 5. Return to clinic in June for 30 mins

## 2017-07-20 DIAGNOSIS — M1990 Unspecified osteoarthritis, unspecified site: Secondary | ICD-10-CM | POA: Diagnosis not present

## 2017-07-20 DIAGNOSIS — E039 Hypothyroidism, unspecified: Secondary | ICD-10-CM | POA: Diagnosis not present

## 2017-07-20 DIAGNOSIS — E1149 Type 2 diabetes mellitus with other diabetic neurological complication: Secondary | ICD-10-CM | POA: Diagnosis not present

## 2017-07-24 ENCOUNTER — Ambulatory Visit (HOSPITAL_COMMUNITY): Payer: Medicaid Other | Admitting: Psychiatry

## 2017-07-24 DIAGNOSIS — Z6831 Body mass index (BMI) 31.0-31.9, adult: Secondary | ICD-10-CM | POA: Diagnosis not present

## 2017-07-24 DIAGNOSIS — M48061 Spinal stenosis, lumbar region without neurogenic claudication: Secondary | ICD-10-CM | POA: Diagnosis not present

## 2017-07-24 DIAGNOSIS — M545 Low back pain: Secondary | ICD-10-CM | POA: Diagnosis not present

## 2017-07-24 DIAGNOSIS — M5416 Radiculopathy, lumbar region: Secondary | ICD-10-CM | POA: Diagnosis not present

## 2017-08-07 ENCOUNTER — Ambulatory Visit (HOSPITAL_COMMUNITY): Payer: Self-pay | Admitting: Psychiatry

## 2017-08-10 DIAGNOSIS — M48061 Spinal stenosis, lumbar region without neurogenic claudication: Secondary | ICD-10-CM | POA: Diagnosis not present

## 2017-08-10 DIAGNOSIS — M545 Low back pain: Secondary | ICD-10-CM | POA: Diagnosis not present

## 2017-08-10 DIAGNOSIS — M5416 Radiculopathy, lumbar region: Secondary | ICD-10-CM | POA: Diagnosis not present

## 2017-08-14 DIAGNOSIS — H52209 Unspecified astigmatism, unspecified eye: Secondary | ICD-10-CM | POA: Diagnosis not present

## 2017-08-14 DIAGNOSIS — H524 Presbyopia: Secondary | ICD-10-CM | POA: Diagnosis not present

## 2017-08-14 DIAGNOSIS — H5203 Hypermetropia, bilateral: Secondary | ICD-10-CM | POA: Diagnosis not present

## 2017-08-16 ENCOUNTER — Other Ambulatory Visit: Payer: Self-pay

## 2017-08-16 ENCOUNTER — Encounter
Admission: RE | Admit: 2017-08-16 | Discharge: 2017-08-16 | Disposition: A | Discharge status: Home / Self Care | Payer: Medicare HMO | Source: Ambulatory Visit | Attending: Orthopedic Surgery | Admitting: Orthopedic Surgery

## 2017-08-16 DIAGNOSIS — F17210 Nicotine dependence, cigarettes, uncomplicated: Secondary | ICD-10-CM | POA: Insufficient documentation

## 2017-08-16 DIAGNOSIS — Z7984 Long term (current) use of oral hypoglycemic drugs: Secondary | ICD-10-CM | POA: Insufficient documentation

## 2017-08-16 DIAGNOSIS — Z01818 Encounter for other preprocedural examination: Secondary | ICD-10-CM | POA: Insufficient documentation

## 2017-08-16 DIAGNOSIS — M1711 Unilateral primary osteoarthritis, right knee: Secondary | ICD-10-CM | POA: Insufficient documentation

## 2017-08-16 DIAGNOSIS — F319 Bipolar disorder, unspecified: Secondary | ICD-10-CM | POA: Diagnosis not present

## 2017-08-16 DIAGNOSIS — E079 Disorder of thyroid, unspecified: Secondary | ICD-10-CM | POA: Insufficient documentation

## 2017-08-16 DIAGNOSIS — I10 Essential (primary) hypertension: Secondary | ICD-10-CM | POA: Diagnosis not present

## 2017-08-16 DIAGNOSIS — Z79890 Hormone replacement therapy (postmenopausal): Secondary | ICD-10-CM | POA: Diagnosis not present

## 2017-08-16 DIAGNOSIS — F419 Anxiety disorder, unspecified: Secondary | ICD-10-CM | POA: Diagnosis not present

## 2017-08-16 DIAGNOSIS — Z79899 Other long term (current) drug therapy: Secondary | ICD-10-CM | POA: Insufficient documentation

## 2017-08-16 DIAGNOSIS — Z7982 Long term (current) use of aspirin: Secondary | ICD-10-CM | POA: Diagnosis not present

## 2017-08-16 DIAGNOSIS — E119 Type 2 diabetes mellitus without complications: Secondary | ICD-10-CM | POA: Diagnosis not present

## 2017-08-16 DIAGNOSIS — K219 Gastro-esophageal reflux disease without esophagitis: Secondary | ICD-10-CM | POA: Insufficient documentation

## 2017-08-16 HISTORY — DX: Other complications of anesthesia, initial encounter: T88.59XA

## 2017-08-16 HISTORY — DX: Nausea with vomiting, unspecified: Z98.890

## 2017-08-16 HISTORY — DX: Nausea with vomiting, unspecified: R11.2

## 2017-08-16 HISTORY — DX: Adverse effect of unspecified anesthetic, initial encounter: T41.45XA

## 2017-08-16 LAB — CBC
HCT: 45.5 % (ref 35.0–47.0)
Hemoglobin: 15.6 g/dL (ref 12.0–16.0)
MCH: 32.2 pg (ref 26.0–34.0)
MCHC: 34.4 g/dL (ref 32.0–36.0)
MCV: 93.8 fL (ref 80.0–100.0)
Platelets: 321 10*3/uL (ref 150–440)
RBC: 4.85 MIL/uL (ref 3.80–5.20)
RDW: 13.3 % (ref 11.5–14.5)
WBC: 9 10*3/uL (ref 3.6–11.0)

## 2017-08-16 LAB — TYPE AND SCREEN
ABO/RH(D): A POS
Antibody Screen: NEGATIVE

## 2017-08-16 LAB — URINALYSIS, ROUTINE W REFLEX MICROSCOPIC
Bacteria, UA: NONE SEEN
Bilirubin Urine: NEGATIVE
Glucose, UA: >=500 mg/dL — AB
Hgb urine dipstick: NEGATIVE
Ketones, ur: NEGATIVE mg/dL
Leukocytes, UA: NEGATIVE
Nitrite: NEGATIVE
Protein, ur: NEGATIVE mg/dL
Specific Gravity, Urine: 1.021 (ref 1.005–1.030)
pH: 5 (ref 5.0–8.0)

## 2017-08-16 LAB — COMPREHENSIVE METABOLIC PANEL
ALT: 53 U/L (ref 14–54)
AST: 47 U/L — ABNORMAL HIGH (ref 15–41)
Albumin: 4.3 g/dL (ref 3.5–5.0)
Alkaline Phosphatase: 90 U/L (ref 38–126)
Anion gap: 11 (ref 5–15)
BUN: 21 mg/dL — ABNORMAL HIGH (ref 6–20)
CO2: 21 mmol/L — ABNORMAL LOW (ref 22–32)
Calcium: 9.6 mg/dL (ref 8.9–10.3)
Chloride: 101 mmol/L (ref 101–111)
Creatinine, Ser: 0.82 mg/dL (ref 0.44–1.00)
GFR calc Af Amer: >60 mL/min (ref >60)
GFR calc non Af Amer: >60 mL/min (ref >60)
Glucose, Bld: 137 mg/dL — ABNORMAL HIGH (ref 65–99)
Potassium: 4.3 mmol/L (ref 3.5–5.1)
Sodium: 133 mmol/L — ABNORMAL LOW (ref 135–145)
Total Bilirubin: 0.5 mg/dL (ref 0.3–1.2)
Total Protein: 7.8 g/dL (ref 6.5–8.1)

## 2017-08-16 LAB — HEMOGLOBIN A1C
Hgb A1c MFr Bld: 7.6 % — ABNORMAL HIGH (ref 4.8–5.6)
Mean Plasma Glucose: 171.42 mg/dL

## 2017-08-16 LAB — C-REACTIVE PROTEIN: CRP: <0.8 mg/dL (ref <1.0)

## 2017-08-16 LAB — SURGICAL PCR SCREEN
MRSA, PCR: NEGATIVE
Staphylococcus aureus: NEGATIVE

## 2017-08-16 LAB — PROTIME-INR
INR: 0.92
Prothrombin Time: 12.3 seconds (ref 11.4–15.2)

## 2017-08-16 LAB — SEDIMENTATION RATE: Sed Rate: 9 mm/hr (ref 0–30)

## 2017-08-16 LAB — APTT: aPTT: 29 seconds (ref 24–36)

## 2017-08-16 NOTE — Patient Instructions (Signed)
Your procedure is scheduled on: 08/28/17 Report to Day Surgery.[facility]OND [facility]LOOR To find out your arrival time please call [phone] between 1PM - 3PM on 08/25/17  Remember: Instructions that are not followed completely may result in serious medical risk, up to and including death, or upon the discretion of your surgeon and anesthesiologist your surgery may need to be rescheduled.     _X__ 1. Do not eat food after midnight the night before your procedure.                 No gum chewing or hard candies. You may drink clear liquids up to 2 hours                 before you are scheduled to arrive for your surgery- DO not drink clear                 liquids within 2 hours of the start of your surgery.                 Clear Liquids include:  water, apple juice without pulp, clear carbohydrate                 drink such as Clearfast of Gartorade, Black Coffee or Tea (Do not add                 anything to coffee or tea).  __X__2.  On the morning of surgery brush your teeth with toothpaste and water, you                 may rinse your mouth with mouthwash if you wish.  Do not swallow any              toothpaste of mouthwash.     _X__ 3.  No Alcohol for 24 hours before or after surgery.   _X__ 4.  Do Not Smoke or use e-cigarettes For 24 Hours Prior to Your Surgery.                 Do not use any chewable tobacco products for at least 6 hours prior to                 surgery.  ____  5.  Bring all medications with you on the day of surgery if instructed.   _X__  6.  Notify your doctor if there is any change in your medical condition      (cold, fever, infections).     Do not wear jewelry, make-up, hairpins, clips or nail polish. Do not wear lotions, powders, or perfumes. You may wear deodorant. Do not shave 48 hours prior to surgery. Men may shave face and neck. Do not bring valuables to the hospital.    [facility] is not responsible for any belongings or  valuables.  Contacts, dentures or bridgework may not be worn into surgery. Leave your suitcase in the car. After surgery it may be brought to your room. For patients admitted to the hospital, discharge time is determined by your treatment team.   Patients discharged the day of surgery will not be allowed to drive home.   Please read over the following fact sheets that you were given:   Surgical Site Infection Prevention / MRSA/ ADVANCE DIRECTIVE  __X__ Take these medicines the morning of surgery with A SIP OF WATER:    1.RELISTOR  2.PANTOPRAZOLE AT BEDTIME 08/27/17 AND ALSO am of surgery  3. CLONAZEPAM  4.LEVOTHYROXINE  5.GABAPENTIN  6.WELLBUTRIN  ____ Fleet Enema (as directed)   _X___ Use CHG Soap as directed  ____ Use inhalers on the day of surgery  _X__ Stop metformin 2 days prior to surgery    ____ Take 1/2 of usual insulin dose the night before surgery. No insulin the morning          of surgery.   _X___ Stop Coumadin/Plavix/aspirin on   STOP ASPIRIN 1 WEEK BEFORE SURGERY  __X__ Stop Anti-inflammatories on    STOP IBUPROFEN 08/19/17 UNTIL AFTER SURGERY   ____ Stop supplements until after surgery.    ____ Bring C-Pap to the hospital.

## 2017-08-17 LAB — URINE CULTURE
Culture: NO GROWTH
Special Requests: NORMAL

## 2017-08-17 NOTE — Pre-Procedure Instructions (Signed)
UA/ HGBA1C FAXED TO DR HOOTEN 

## 2017-08-27 MED ORDER — CEFAZOLIN SODIUM-DEXTROSE 2-4 GM/100ML-% IV SOLN: 2.0000 g | INTRAVENOUS | Status: DC

## 2017-08-27 MED ORDER — TRANEXAMIC ACID 1000 MG/10ML IV SOLN
1000.0000 mg | INTRAVENOUS | Status: DC
  Filled 2017-08-27: qty 10

## 2017-08-28 ENCOUNTER — Encounter
Admission: RE | Disposition: A | Discharge status: Home Health | Payer: Self-pay | Source: Home / Self Care | Attending: Orthopedic Surgery

## 2017-08-28 ENCOUNTER — Inpatient Hospital Stay: Payer: Medicare HMO | Admitting: Anesthesiology

## 2017-08-28 ENCOUNTER — Inpatient Hospital Stay
Admission: RE | Admit: 2017-08-28 | Discharge: 2017-08-30 | DRG: 470 | Disposition: A | Discharge status: Home Health | Payer: Medicare HMO | Attending: Orthopedic Surgery | Admitting: Orthopedic Surgery

## 2017-08-28 ENCOUNTER — Encounter: Payer: Self-pay | Admitting: Orthopedic Surgery

## 2017-08-28 ENCOUNTER — Inpatient Hospital Stay: Payer: Medicare HMO

## 2017-08-28 DIAGNOSIS — F319 Bipolar disorder, unspecified: Secondary | ICD-10-CM | POA: Diagnosis present

## 2017-08-28 DIAGNOSIS — Z96651 Presence of right artificial knee joint: Secondary | ICD-10-CM | POA: Diagnosis not present

## 2017-08-28 DIAGNOSIS — E785 Hyperlipidemia, unspecified: Secondary | ICD-10-CM | POA: Diagnosis present

## 2017-08-28 DIAGNOSIS — F418 Other specified anxiety disorders: Secondary | ICD-10-CM | POA: Diagnosis not present

## 2017-08-28 DIAGNOSIS — F909 Attention-deficit hyperactivity disorder, unspecified type: Secondary | ICD-10-CM | POA: Diagnosis present

## 2017-08-28 DIAGNOSIS — Z96659 Presence of unspecified artificial knee joint: Secondary | ICD-10-CM

## 2017-08-28 DIAGNOSIS — G43909 Migraine, unspecified, not intractable, without status migrainosus: Secondary | ICD-10-CM | POA: Diagnosis not present

## 2017-08-28 DIAGNOSIS — M797 Fibromyalgia: Secondary | ICD-10-CM | POA: Diagnosis not present

## 2017-08-28 DIAGNOSIS — Z7984 Long term (current) use of oral hypoglycemic drugs: Secondary | ICD-10-CM | POA: Diagnosis not present

## 2017-08-28 DIAGNOSIS — F419 Anxiety disorder, unspecified: Secondary | ICD-10-CM | POA: Diagnosis present

## 2017-08-28 DIAGNOSIS — E079 Disorder of thyroid, unspecified: Secondary | ICD-10-CM | POA: Diagnosis present

## 2017-08-28 DIAGNOSIS — Z23 Encounter for immunization: Secondary | ICD-10-CM

## 2017-08-28 DIAGNOSIS — Z79890 Hormone replacement therapy (postmenopausal): Secondary | ICD-10-CM | POA: Diagnosis not present

## 2017-08-28 DIAGNOSIS — F4310 Post-traumatic stress disorder, unspecified: Secondary | ICD-10-CM | POA: Diagnosis not present

## 2017-08-28 DIAGNOSIS — K219 Gastro-esophageal reflux disease without esophagitis: Secondary | ICD-10-CM | POA: Diagnosis present

## 2017-08-28 DIAGNOSIS — Z79899 Other long term (current) drug therapy: Secondary | ICD-10-CM

## 2017-08-28 DIAGNOSIS — M1711 Unilateral primary osteoarthritis, right knee: Principal | ICD-10-CM | POA: Diagnosis present

## 2017-08-28 DIAGNOSIS — M25561 Pain in right knee: Secondary | ICD-10-CM | POA: Diagnosis present

## 2017-08-28 DIAGNOSIS — M25761 Osteophyte, right knee: Secondary | ICD-10-CM | POA: Diagnosis present

## 2017-08-28 DIAGNOSIS — E119 Type 2 diabetes mellitus without complications: Secondary | ICD-10-CM | POA: Diagnosis not present

## 2017-08-28 DIAGNOSIS — Z471 Aftercare following joint replacement surgery: Secondary | ICD-10-CM | POA: Diagnosis not present

## 2017-08-28 DIAGNOSIS — Z7982 Long term (current) use of aspirin: Secondary | ICD-10-CM

## 2017-08-28 DIAGNOSIS — E7800 Pure hypercholesterolemia, unspecified: Secondary | ICD-10-CM | POA: Diagnosis not present

## 2017-08-28 HISTORY — PX: KNEE ARTHROPLASTY: SHX992

## 2017-08-28 LAB — GLUCOSE, CAPILLARY
Glucose-Capillary: 178 mg/dL — ABNORMAL HIGH (ref 65–99)
Glucose-Capillary: 235 mg/dL — ABNORMAL HIGH (ref 65–99)
Glucose-Capillary: 206 mg/dL — ABNORMAL HIGH (ref 65–99)
Glucose-Capillary: 247 mg/dL — ABNORMAL HIGH (ref 65–99)

## 2017-08-28 LAB — ABO/RH: ABO/RH(D): A POS

## 2017-08-28 SURGERY — ARTHROPLASTY, KNEE, TOTAL, USING IMAGELESS COMPUTER-ASSISTED NAVIGATION
Anesthesia: General | Laterality: Right

## 2017-08-28 MED ORDER — PANTOPRAZOLE SODIUM 40 MG PO TBEC
40.0000 mg | DELAYED_RELEASE_TABLET | Freq: Two times a day (BID) | ORAL | Status: DC
  Administered 2017-08-28 – 2017-08-30 (×4): 40 mg via ORAL
  Filled 2017-08-28 (×4): qty 1

## 2017-08-28 MED ORDER — DULAGLUTIDE 1.5 MG/0.5ML ~~LOC~~ SOAJ: 0.5000 mL | SUBCUTANEOUS | Status: DC

## 2017-08-28 MED ORDER — TRANEXAMIC ACID 1000 MG/10ML IV SOLN
INTRAVENOUS | Status: DC | PRN
  Administered 2017-08-28: 1000 mg via INTRAVENOUS

## 2017-08-28 MED ORDER — SODIUM CHLORIDE 0.9 % IV SOLN
INTRAVENOUS | Status: DC | PRN
  Administered 2017-08-28: 60 mL

## 2017-08-28 MED ORDER — FENTANYL CITRATE (PF) 100 MCG/2ML IJ SOLN: 25.0000 ug | INTRAMUSCULAR | Status: DC | PRN

## 2017-08-28 MED ORDER — MIDAZOLAM HCL 5 MG/5ML IJ SOLN
INTRAMUSCULAR | Status: DC | PRN
  Administered 2017-08-28 (×2): 1 mg via INTRAVENOUS

## 2017-08-28 MED ORDER — ONDANSETRON HCL 4 MG/2ML IJ SOLN: 4.0000 mg | Freq: Once | INTRAMUSCULAR | Status: DC | PRN

## 2017-08-28 MED ORDER — BUPIVACAINE HCL (PF) 0.25 % IJ SOLN
INTRAMUSCULAR | Status: AC
  Filled 2017-08-28: qty 60

## 2017-08-28 MED ORDER — FENTANYL CITRATE (PF) 100 MCG/2ML IJ SOLN
INTRAMUSCULAR | Status: AC
  Filled 2017-08-28: qty 2

## 2017-08-28 MED ORDER — INSULIN ASPART 100 UNIT/ML ~~LOC~~ SOLN
0.0000 [IU] | Freq: Three times a day (TID) | SUBCUTANEOUS | Status: DC
  Administered 2017-08-29 (×2): 3 [IU] via SUBCUTANEOUS
  Administered 2017-08-29: 2 [IU] via SUBCUTANEOUS
  Administered 2017-08-30: 3 [IU] via SUBCUTANEOUS
  Filled 2017-08-28 (×4): qty 1

## 2017-08-28 MED ORDER — QUETIAPINE FUMARATE 25 MG PO TABS
200.0000 mg | ORAL_TABLET | Freq: Every day | ORAL | Status: DC
  Administered 2017-08-28 – 2017-08-29 (×2): 200 mg via ORAL
  Filled 2017-08-28 (×2): qty 8

## 2017-08-28 MED ORDER — PROPOFOL 500 MG/50ML IV EMUL
INTRAVENOUS | Status: DC | PRN
  Administered 2017-08-28: 100 ug/kg/min via INTRAVENOUS

## 2017-08-28 MED ORDER — ENOXAPARIN SODIUM 30 MG/0.3ML ~~LOC~~ SOLN
30.0000 mg | Freq: Two times a day (BID) | SUBCUTANEOUS | Status: DC
Start: 2017-08-29 — End: 2017-08-30
  Administered 2017-08-29 – 2017-08-30 (×3): 30 mg via SUBCUTANEOUS
  Filled 2017-08-28 (×3): qty 0.3

## 2017-08-28 MED ORDER — CEFAZOLIN SODIUM-DEXTROSE 2-4 GM/100ML-% IV SOLN
2.0000 g | Freq: Four times a day (QID) | INTRAVENOUS | Status: AC
  Administered 2017-08-28 – 2017-08-29 (×4): 2 g via INTRAVENOUS
  Filled 2017-08-28 (×4): qty 100

## 2017-08-28 MED ORDER — ALUM & MAG HYDROXIDE-SIMETH 200-200-20 MG/5ML PO SUSP: 30.0000 mL | ORAL | Status: DC | PRN

## 2017-08-28 MED ORDER — OXYCODONE HCL 5 MG PO TABS
10.0000 mg | ORAL_TABLET | ORAL | Status: DC | PRN
  Administered 2017-08-28 – 2017-08-30 (×8): 10 mg via ORAL
  Filled 2017-08-28 (×9): qty 2

## 2017-08-28 MED ORDER — PROPOFOL 500 MG/50ML IV EMUL
INTRAVENOUS | Status: AC
  Filled 2017-08-28: qty 50

## 2017-08-28 MED ORDER — PHENYLEPHRINE HCL 10 MG/ML IJ SOLN
INTRAMUSCULAR | Status: DC | PRN
  Administered 2017-08-28: 30 ug/min via INTRAVENOUS

## 2017-08-28 MED ORDER — TRAMADOL HCL 50 MG PO TABS
50.0000 mg | ORAL_TABLET | ORAL | Status: DC | PRN
  Administered 2017-08-29: 100 mg via ORAL
  Administered 2017-08-29: 50 mg via ORAL
  Administered 2017-08-30 (×2): 100 mg via ORAL
  Filled 2017-08-28: qty 2
  Filled 2017-08-28: qty 1
  Filled 2017-08-28 (×3): qty 2

## 2017-08-28 MED ORDER — METOCLOPRAMIDE HCL 5 MG/ML IJ SOLN: 5.0000 mg | Freq: Three times a day (TID) | INTRAMUSCULAR | Status: DC | PRN

## 2017-08-28 MED ORDER — MIDAZOLAM HCL 2 MG/2ML IJ SOLN
2.0000 mg | Freq: Once | INTRAMUSCULAR | Status: AC
  Administered 2017-08-28: 2 mg via INTRAVENOUS

## 2017-08-28 MED ORDER — MIDAZOLAM HCL 2 MG/2ML IJ SOLN
INTRAMUSCULAR | Status: AC
  Administered 2017-08-28: 2 mg via INTRAVENOUS
  Filled 2017-08-28: qty 2

## 2017-08-28 MED ORDER — TETRACAINE HCL 1 % IJ SOLN
INTRAMUSCULAR | Status: DC | PRN
  Administered 2017-08-28: 3 mg via INTRASPINAL

## 2017-08-28 MED ORDER — OXYCODONE HCL 5 MG PO TABS: 5.0000 mg | ORAL_TABLET | ORAL | Status: DC | PRN

## 2017-08-28 MED ORDER — CHLORHEXIDINE GLUCONATE 4 % EX LIQD: 60.0000 mL | Freq: Once | CUTANEOUS | Status: DC

## 2017-08-28 MED ORDER — CLONAZEPAM 0.5 MG PO TABS
0.5000 mg | ORAL_TABLET | Freq: Two times a day (BID) | ORAL | Status: DC
  Administered 2017-08-28 – 2017-08-30 (×4): 0.5 mg via ORAL
  Filled 2017-08-28 (×4): qty 1

## 2017-08-28 MED ORDER — MIDAZOLAM HCL 2 MG/2ML IJ SOLN
INTRAMUSCULAR | Status: AC
  Filled 2017-08-28: qty 2

## 2017-08-28 MED ORDER — AMPHETAMINE-DEXTROAMPHET ER 15 MG PO CP24: 15.0000 mg | ORAL_CAPSULE | ORAL | Status: DC

## 2017-08-28 MED ORDER — ONDANSETRON HCL 4 MG/2ML IJ SOLN: 4.0000 mg | Freq: Four times a day (QID) | INTRAMUSCULAR | Status: DC | PRN

## 2017-08-28 MED ORDER — BUPIVACAINE LIPOSOME 1.3 % IJ SUSP
INTRAMUSCULAR | Status: AC
  Filled 2017-08-28: qty 20

## 2017-08-28 MED ORDER — ACETAMINOPHEN 10 MG/ML IV SOLN
INTRAVENOUS | Status: AC
  Filled 2017-08-28: qty 100

## 2017-08-28 MED ORDER — PIOGLITAZONE HCL 15 MG PO TABS
15.0000 mg | ORAL_TABLET | Freq: Every day | ORAL | Status: DC
  Administered 2017-08-28 – 2017-08-30 (×3): 15 mg via ORAL
  Filled 2017-08-28 (×3): qty 1

## 2017-08-28 MED ORDER — BISACODYL 10 MG RE SUPP: 10.0000 mg | Freq: Every day | RECTAL | Status: DC | PRN

## 2017-08-28 MED ORDER — FERROUS SULFATE 325 (65 FE) MG PO TABS
325.0000 mg | ORAL_TABLET | Freq: Two times a day (BID) | ORAL | Status: DC
  Administered 2017-08-28 – 2017-08-30 (×4): 325 mg via ORAL
  Filled 2017-08-28 (×4): qty 1

## 2017-08-28 MED ORDER — NICOTINE 21 MG/24HR TD PT24
21.0000 mg | MEDICATED_PATCH | Freq: Every day | TRANSDERMAL | Status: DC
  Administered 2017-08-28 – 2017-08-30 (×3): 21 mg via TRANSDERMAL
  Filled 2017-08-28 (×3): qty 1

## 2017-08-28 MED ORDER — INSULIN ASPART 100 UNIT/ML ~~LOC~~ SOLN
4.0000 [IU] | Freq: Once | SUBCUTANEOUS | Status: AC
  Administered 2017-08-28: 4 [IU] via SUBCUTANEOUS

## 2017-08-28 MED ORDER — CEFAZOLIN SODIUM-DEXTROSE 2-4 GM/100ML-% IV SOLN
INTRAVENOUS | Status: AC
  Filled 2017-08-28: qty 100

## 2017-08-28 MED ORDER — ACETAMINOPHEN 10 MG/ML IV SOLN
1000.0000 mg | Freq: Four times a day (QID) | INTRAVENOUS | Status: AC
  Administered 2017-08-28 – 2017-08-29 (×3): 1000 mg via INTRAVENOUS
  Filled 2017-08-28 (×4): qty 100

## 2017-08-28 MED ORDER — GABAPENTIN 400 MG PO CAPS
1600.0000 mg | ORAL_CAPSULE | Freq: Two times a day (BID) | ORAL | Status: DC
  Administered 2017-08-28 – 2017-08-30 (×4): 1600 mg via ORAL
  Filled 2017-08-28 (×4): qty 4

## 2017-08-28 MED ORDER — TRANEXAMIC ACID 1000 MG/10ML IV SOLN
1000.0000 mg | Freq: Once | INTRAVENOUS | Status: AC
  Administered 2017-08-28: 1000 mg via INTRAVENOUS
  Filled 2017-08-28: qty 10

## 2017-08-28 MED ORDER — BUPIVACAINE HCL (PF) 0.25 % IJ SOLN
INTRAMUSCULAR | Status: DC | PRN
  Administered 2017-08-28: 60 mL

## 2017-08-28 MED ORDER — NEOMYCIN-POLYMYXIN B GU 40-200000 IR SOLN
Status: AC
  Filled 2017-08-28: qty 1

## 2017-08-28 MED ORDER — GABAPENTIN 300 MG PO CAPS: 300.0000 mg | ORAL_CAPSULE | Freq: Once | ORAL | Status: DC

## 2017-08-28 MED ORDER — DEXAMETHASONE SODIUM PHOSPHATE 10 MG/ML IJ SOLN
8.0000 mg | Freq: Once | INTRAMUSCULAR | Status: AC
  Administered 2017-08-28: 8 mg via INTRAVENOUS

## 2017-08-28 MED ORDER — METHOCARBAMOL 500 MG PO TABS
500.0000 mg | ORAL_TABLET | Freq: Four times a day (QID) | ORAL | Status: DC
  Administered 2017-08-28 – 2017-08-30 (×7): 500 mg via ORAL
  Filled 2017-08-28 (×7): qty 1

## 2017-08-28 MED ORDER — CELECOXIB 200 MG PO CAPS
200.0000 mg | ORAL_CAPSULE | Freq: Two times a day (BID) | ORAL | Status: DC
  Administered 2017-08-28 – 2017-08-30 (×4): 200 mg via ORAL
  Filled 2017-08-28 (×4): qty 1

## 2017-08-28 MED ORDER — INSULIN ASPART 100 UNIT/ML ~~LOC~~ SOLN
SUBCUTANEOUS | Status: AC
  Administered 2017-08-28: 4 [IU] via SUBCUTANEOUS
  Filled 2017-08-28: qty 1

## 2017-08-28 MED ORDER — SODIUM CHLORIDE FLUSH 0.9 % IV SOLN
INTRAVENOUS | Status: AC
  Filled 2017-08-28: qty 40

## 2017-08-28 MED ORDER — DEXAMETHASONE SODIUM PHOSPHATE 10 MG/ML IJ SOLN
INTRAMUSCULAR | Status: AC
  Administered 2017-08-28: 8 mg via INTRAVENOUS
  Filled 2017-08-28: qty 1

## 2017-08-28 MED ORDER — NEOMYCIN-POLYMYXIN B GU 40-200000 IR SOLN
Status: DC | PRN
  Administered 2017-08-28: 14 mL

## 2017-08-28 MED ORDER — PROPOFOL 10 MG/ML IV BOLUS
INTRAVENOUS | Status: DC | PRN
  Administered 2017-08-28: 200 mg via INTRAVENOUS
  Administered 2017-08-28: 20 mg via INTRAVENOUS

## 2017-08-28 MED ORDER — GLIPIZIDE ER 5 MG PO TB24
5.0000 mg | ORAL_TABLET | Freq: Every day | ORAL | Status: DC
  Administered 2017-08-29 – 2017-08-30 (×2): 5 mg via ORAL
  Filled 2017-08-28 (×2): qty 1

## 2017-08-28 MED ORDER — PHENOL 1.4 % MT LIQD
1.0000 | OROMUCOSAL | Status: DC | PRN
  Filled 2017-08-28: qty 177

## 2017-08-28 MED ORDER — CELECOXIB 200 MG PO CAPS
ORAL_CAPSULE | ORAL | Status: AC
  Administered 2017-08-28: 400 mg
  Filled 2017-08-28: qty 2

## 2017-08-28 MED ORDER — BUPIVACAINE HCL (PF) 0.5 % IJ SOLN
INTRAMUSCULAR | Status: DC | PRN
  Administered 2017-08-28: 2.7 mL

## 2017-08-28 MED ORDER — METOCLOPRAMIDE HCL 10 MG PO TABS
10.0000 mg | ORAL_TABLET | Freq: Three times a day (TID) | ORAL | Status: DC
  Administered 2017-08-28 – 2017-08-30 (×6): 10 mg via ORAL
  Filled 2017-08-28 (×6): qty 1

## 2017-08-28 MED ORDER — SENNOSIDES-DOCUSATE SODIUM 8.6-50 MG PO TABS
1.0000 | ORAL_TABLET | Freq: Two times a day (BID) | ORAL | Status: DC
Start: 2017-08-28 — End: 2017-08-30
  Administered 2017-08-28 – 2017-08-30 (×4): 1 via ORAL
  Filled 2017-08-28 (×4): qty 1

## 2017-08-28 MED ORDER — FENTANYL CITRATE (PF) 100 MCG/2ML IJ SOLN
INTRAMUSCULAR | Status: DC | PRN
  Administered 2017-08-28: 50 ug via INTRAVENOUS

## 2017-08-28 MED ORDER — METHYLNALTREXONE BROMIDE 150 MG PO TABS: 150.0000 mg | ORAL_TABLET | Freq: Every day | ORAL | Status: DC

## 2017-08-28 MED ORDER — METFORMIN HCL ER 500 MG PO TB24
1000.0000 mg | ORAL_TABLET | Freq: Two times a day (BID) | ORAL | Status: DC
  Administered 2017-08-28 – 2017-08-30 (×4): 1000 mg via ORAL
  Filled 2017-08-28 (×5): qty 2

## 2017-08-28 MED ORDER — CEFAZOLIN SODIUM-DEXTROSE 2-3 GM-%(50ML) IV SOLR
INTRAVENOUS | Status: DC | PRN
  Administered 2017-08-28: 2 g via INTRAVENOUS

## 2017-08-28 MED ORDER — LIDOCAINE HCL (PF) 2 % IJ SOLN
INTRAMUSCULAR | Status: AC
  Filled 2017-08-28: qty 10

## 2017-08-28 MED ORDER — MAGNESIUM HYDROXIDE 400 MG/5ML PO SUSP
30.0000 mL | Freq: Every day | ORAL | Status: DC
  Filled 2017-08-28: qty 30

## 2017-08-28 MED ORDER — LEVOTHYROXINE SODIUM 50 MCG PO TABS
150.0000 ug | ORAL_TABLET | Freq: Every day | ORAL | Status: DC
  Administered 2017-08-29 – 2017-08-30 (×2): 150 ug via ORAL
  Filled 2017-08-28 (×2): qty 1

## 2017-08-28 MED ORDER — BUPROPION HCL ER (SR) 100 MG PO TB12
100.0000 mg | ORAL_TABLET | Freq: Every day | ORAL | Status: DC
  Administered 2017-08-29 – 2017-08-30 (×2): 100 mg via ORAL
  Filled 2017-08-28 (×2): qty 1

## 2017-08-28 MED ORDER — SODIUM CHLORIDE 0.9 % IJ SOLN
INTRAMUSCULAR | Status: DC | PRN
  Administered 2017-08-28: 50 mL via INTRAVENOUS

## 2017-08-28 MED ORDER — SODIUM CHLORIDE 0.9 % IV SOLN
INTRAVENOUS | Status: DC
  Administered 2017-08-28 (×2): via INTRAVENOUS

## 2017-08-28 MED ORDER — ACETAMINOPHEN 325 MG PO TABS: 325.0000 mg | ORAL_TABLET | Freq: Four times a day (QID) | ORAL | Status: DC | PRN

## 2017-08-28 MED ORDER — SODIUM CHLORIDE 0.9 % IV SOLN
INTRAVENOUS | Status: DC
  Administered 2017-08-28 (×2): via INTRAVENOUS

## 2017-08-28 MED ORDER — SUMATRIPTAN SUCCINATE 50 MG PO TABS
100.0000 mg | ORAL_TABLET | ORAL | Status: DC | PRN
  Filled 2017-08-28: qty 2

## 2017-08-28 MED ORDER — GLYCOPYRROLATE 0.2 MG/ML IJ SOLN
INTRAMUSCULAR | Status: AC
  Filled 2017-08-28: qty 1

## 2017-08-28 MED ORDER — FLEET ENEMA 7-19 GM/118ML RE ENEM: 1.0000 | ENEMA | Freq: Once | RECTAL | Status: DC | PRN

## 2017-08-28 MED ORDER — HYDROMORPHONE HCL 1 MG/ML IJ SOLN: 0.5000 mg | INTRAMUSCULAR | Status: DC | PRN

## 2017-08-28 MED ORDER — DIPHENHYDRAMINE HCL 12.5 MG/5ML PO ELIX: 12.5000 mg | ORAL_SOLUTION | ORAL | Status: DC | PRN

## 2017-08-28 MED ORDER — METOCLOPRAMIDE HCL 10 MG PO TABS: 5.0000 mg | ORAL_TABLET | Freq: Three times a day (TID) | ORAL | Status: DC | PRN

## 2017-08-28 MED ORDER — MENTHOL 3 MG MT LOZG
1.0000 | LOZENGE | OROMUCOSAL | Status: DC | PRN
  Filled 2017-08-28: qty 9

## 2017-08-28 MED ORDER — ACETAMINOPHEN 10 MG/ML IV SOLN
INTRAVENOUS | Status: DC | PRN
  Administered 2017-08-28: 1000 mg via INTRAVENOUS

## 2017-08-28 MED ORDER — ONDANSETRON HCL 4 MG PO TABS: 4.0000 mg | ORAL_TABLET | Freq: Four times a day (QID) | ORAL | Status: DC | PRN

## 2017-08-28 MED ORDER — SIMVASTATIN 20 MG PO TABS
40.0000 mg | ORAL_TABLET | Freq: Every day | ORAL | Status: DC
  Administered 2017-08-28 – 2017-08-30 (×3): 40 mg via ORAL
  Filled 2017-08-28 (×3): qty 2

## 2017-08-28 SURGICAL SUPPLY — 68 items
BATTERY INSTRU NAVIGATION (MISCELLANEOUS) ×12 IMPLANT
BLADE SAGITTAL 25.0X1.19X90 (BLADE) ×2 IMPLANT
BLADE SAGITTAL 25.0X1.19X90MM (BLADE) ×1
BLADE SAW 1/2 (BLADE) ×3 IMPLANT
BLADE SAW 70X12.5 (BLADE) IMPLANT
BONE CEMENT GENTAMICIN (Cement) ×6 IMPLANT
BTRY SRG DRVR LF (MISCELLANEOUS) ×4
CANISTER SUCT 1200ML W/VALVE (MISCELLANEOUS) ×3 IMPLANT
CANISTER SUCT 3000ML PPV (MISCELLANEOUS) ×6 IMPLANT
CAPT KNEE TOTAL 3 ATTUNE ×2 IMPLANT
CEMENT BONE GENTAMICIN 40 (Cement) IMPLANT
COOLER POLAR GLACIER W/PUMP (MISCELLANEOUS) ×3 IMPLANT
CUFF TOURN 24 STER (MISCELLANEOUS) IMPLANT
CUFF TOURN 30 STER DUAL PORT (MISCELLANEOUS) IMPLANT
DRAPE SHEET LG 3/4 BI-LAMINATE (DRAPES) ×3 IMPLANT
DRSG DERMACEA 8X12 NADH (GAUZE/BANDAGES/DRESSINGS) ×3 IMPLANT
DRSG OPSITE POSTOP 4X14 (GAUZE/BANDAGES/DRESSINGS) ×3 IMPLANT
DRSG TEGADERM 4X4.75 (GAUZE/BANDAGES/DRESSINGS) ×3 IMPLANT
DURAPREP 26ML APPLICATOR (WOUND CARE) ×6 IMPLANT
ELECT CAUTERY BLADE 6.4 (BLADE) ×3 IMPLANT
ELECT REM PT RETURN 9FT ADLT (ELECTROSURGICAL) ×3
ELECTRODE REM PT RTRN 9FT ADLT (ELECTROSURGICAL) ×1 IMPLANT
EX-PIN ORTHOLOCK NAV 4X150 (PIN) ×6 IMPLANT
GLOVE BIOGEL M STRL SZ7.5 (GLOVE) ×6 IMPLANT
GLOVE BIOGEL PI IND STRL 9 (GLOVE) ×1 IMPLANT
GLOVE BIOGEL PI INDICATOR 9 (GLOVE) ×2
GLOVE INDICATOR 8.0 STRL GRN (GLOVE) ×3 IMPLANT
GLOVE SURG SYN 9.0  PF PI (GLOVE) ×2
GLOVE SURG SYN 9.0 PF PI (GLOVE) ×1 IMPLANT
GOWN STRL REUS W/ TWL LRG LVL3 (GOWN DISPOSABLE) ×2 IMPLANT
GOWN STRL REUS W/TWL 2XL LVL3 (GOWN DISPOSABLE) ×3 IMPLANT
GOWN STRL REUS W/TWL LRG LVL3 (GOWN DISPOSABLE) ×6
HEMOVAC 400CC 10FR (MISCELLANEOUS) ×3 IMPLANT
HOLDER FOLEY CATH W/STRAP (MISCELLANEOUS) ×3 IMPLANT
HOOD PEEL AWAY FLYTE STAYCOOL (MISCELLANEOUS) ×6 IMPLANT
KIT TURNOVER KIT A (KITS) ×3 IMPLANT
KNIFE SCULPS 14X20 (INSTRUMENTS) ×3 IMPLANT
LABEL OR SOLS (LABEL) ×3 IMPLANT
NDL SAFETY ECLIPSE 18X1.5 (NEEDLE) ×1 IMPLANT
NDL SPNL 20GX3.5 QUINCKE YW (NEEDLE) ×2 IMPLANT
NEEDLE HYPO 18GX1.5 SHARP (NEEDLE) ×3
NEEDLE SPNL 20GX3.5 QUINCKE YW (NEEDLE) ×6 IMPLANT
NS IRRIG 500ML POUR BTL (IV SOLUTION) ×3 IMPLANT
PACK TOTAL KNEE (MISCELLANEOUS) ×3 IMPLANT
PAD WRAPON POLAR KNEE (MISCELLANEOUS) ×1 IMPLANT
PIN DRILL QUICK PACK ×3 IMPLANT
PIN FIXATION 1/8DIA X 3INL (PIN) ×3 IMPLANT
PULSAVAC PLUS IRRIG FAN TIP (DISPOSABLE) ×3
SOL .9 NS 3000ML IRR  AL (IV SOLUTION) ×2
SOL .9 NS 3000ML IRR AL (IV SOLUTION) ×1
SOL .9 NS 3000ML IRR UROMATIC (IV SOLUTION) ×1 IMPLANT
SOL PREP PVP 2OZ (MISCELLANEOUS) ×3
SOLUTION PREP PVP 2OZ (MISCELLANEOUS) ×1 IMPLANT
SPONGE DRAIN TRACH 4X4 STRL 2S (GAUZE/BANDAGES/DRESSINGS) ×3 IMPLANT
STAPLER SKIN PROX 35W (STAPLE) ×3 IMPLANT
STRAP TIBIA SHORT (MISCELLANEOUS) ×3 IMPLANT
SUCTION FRAZIER HANDLE 10FR (MISCELLANEOUS) ×2
SUCTION TUBE FRAZIER 10FR DISP (MISCELLANEOUS) ×1 IMPLANT
SUT VIC AB 0 CT1 36 (SUTURE) ×3 IMPLANT
SUT VIC AB 1 CT1 36 (SUTURE) ×6 IMPLANT
SUT VIC AB 2-0 CT2 27 (SUTURE) ×3 IMPLANT
SYR 20CC LL (SYRINGE) ×3 IMPLANT
SYR 30ML LL (SYRINGE) ×6 IMPLANT
TIP FAN IRRIG PULSAVAC PLUS (DISPOSABLE) ×1 IMPLANT
TOWEL OR 17X26 4PK STRL BLUE (TOWEL DISPOSABLE) ×3 IMPLANT
TOWER CARTRIDGE SMART MIX (DISPOSABLE) ×3 IMPLANT
TRAY FOLEY MTR SLVR 16FR STAT (SET/KITS/TRAYS/PACK) ×3 IMPLANT
WRAPON POLAR PAD KNEE (MISCELLANEOUS) ×3

## 2017-08-28 NOTE — Discharge Instructions (Signed)
°  Instructions after Total Knee Replacement ° ° Siobahn Worsley P. Chanita Boden, Jr., M.D.    ° Dept. of Orthopaedics & Sports Medicine ° [facility] ° [address] ° [place], [place]  [address] ° Phone: [phone]   Fax: [phone] ° °  °DIET: °• Drink plenty of non-alcoholic fluids. °• Resume your normal diet. Include foods high in fiber. ° °ACTIVITY:  °• You may use crutches or a walker with weight-bearing as tolerated, unless instructed otherwise. °• You may be weaned off of the walker or crutches by your Physical Therapist.  °• Do NOT place pillows under the knee. Anything placed under the knee could limit your ability to straighten the knee.   °• Continue doing gentle exercises. Exercising will reduce the pain and swelling, increase motion, and prevent muscle weakness.   °• Please continue to use the TED compression stockings for 6 weeks. You may remove the stockings at night, but should reapply them in the morning. °• Do not drive or operate any equipment until instructed. ° °WOUND CARE:  °• Continue to use the PolarCare or ice packs periodically to reduce pain and swelling. °• You may bathe or shower after the staples are removed at the first office visit following surgery. ° °MEDICATIONS: °• You may resume your regular medications. °• Please take the pain medication as prescribed on the medication. °• Do not take pain medication on an empty stomach. °• You have been given a prescription for a blood thinner (Lovenox or Coumadin). Please take the medication as instructed. (NOTE: After completing a 2 week course of Lovenox, take one Enteric-coated aspirin once a day. This along with elevation will help reduce the possibility of phlebitis in your operated leg.) °• Do not drive or drink alcoholic beverages when taking pain medications. ° °CALL THE OFFICE FOR: °• Temperature above 101 degrees °• Excessive bleeding or drainage on the dressing. °• Excessive swelling, coldness, or paleness of the toes. °• Persistent  nausea and vomiting. ° °FOLLOW-UP:  °• You should have an appointment to return to the office in 10-14 days after surgery. °• Arrangements have been made for continuation of Physical Therapy (either home therapy or outpatient therapy). °  °

## 2017-08-28 NOTE — Transfer of Care (Signed)
Immediate Anesthesia Transfer of Care Note  Patient: [name]  Procedure(s) Performed: COMPUTER ASSISTED TOTAL KNEE ARTHROPLASTY (Right )  Patient Location: PACU  Anesthesia Type:General and Spinal  Level of Consciousness: awake, alert , oriented and patient cooperative  Airway & Oxygen Therapy: Patient Spontanous Breathing and Patient connected to nasal cannula oxygen  Post-op Assessment: Report given to RN and Post -op Vital signs reviewed and stable  Post vital signs: Reviewed and stable  Last Vitals:  Vitals Value Taken Time  BP    Temp    Pulse 89 08/28/2017  2:45 PM  Resp 13 08/28/2017  2:45 PM  SpO2 96 % 08/28/2017  2:45 PM  Vitals shown include unvalidated device data.  Last Pain:  Vitals:   08/28/17 0944  TempSrc: Oral  PainSc: 6          Complications: No apparent anesthesia complications

## 2017-08-28 NOTE — Op Note (Signed)
OPERATIVE NOTE  DATE OF SURGERY:  08/28/2017  PATIENT NAME:  SAHASRA BELUE   DOB: 1961/01/11  MRN: [id number]  PRE-OPERATIVE DIAGNOSIS: Degenerative arthrosis of the right knee, primary  POST-OPERATIVE DIAGNOSIS:  Same  PROCEDURE:  Right total knee arthroplasty using computer-assisted navigation  SURGEON:  Marciano Sequin. M.D.  ASSISTANT:  Vance Peper, PA (present and scrubbed throughout the case, critical for assistance with exposure, retraction, instrumentation, and closure)  ANESTHESIA: spinal and general  ESTIMATED BLOOD LOSS: 50 mL  FLUIDS REPLACED: 1000 mL of crystalloid  TOURNIQUET TIME: 80 minutes  DRAINS: 2 medium Hemovac drains  SOFT TISSUE RELEASES: Anterior cruciate ligament, posterior cruciate ligament, deep medial collateral ligament, patellofemoral ligament  IMPLANTS UTILIZED: DePuy Attune size 4 posterior stabilized femoral component (cemented), size 4 rotating platform tibial component (cemented), 35 mm medialized dome patella (cemented), and a 5 mm stabilized rotating platform polyethylene insert.  INDICATIONS FOR SURGERY: [name] is a 57 y.o. year old female with a long history of progressive knee pain. X-rays demonstrated severe degenerative changes in tricompartmental fashion. The patient had not seen any significant improvement despite conservative nonsurgical intervention. After discussion of the risks and benefits of surgical intervention, the patient expressed understanding of the risks benefits and agree with plans for total knee arthroplasty.   The risks, benefits, and alternatives were discussed at length including but not limited to the risks of infection, bleeding, nerve injury, stiffness, blood clots, the need for revision surgery, cardiopulmonary complications, among others, and they were willing to proceed.  PROCEDURE IN DETAIL: The patient was brought into the operating room and, after adequate spinal and general anesthesia was  achieved, a tourniquet was placed on the patient's upper thigh. The patient's knee and leg were cleaned and prepped with alcohol and DuraPrep and draped in the usual sterile fashion. A "timeout" was performed as per usual protocol. The lower extremity was exsanguinated using an Esmarch, and the tourniquet was inflated to 300 mmHg. An anterior longitudinal incision was made followed by a standard mid vastus approach. The deep fibers of the medial collateral ligament were elevated in a subperiosteal fashion off of the medial flare of the tibia so as to maintain a continuous soft tissue sleeve. The patella was subluxed laterally and the patellofemoral ligament was incised. Inspection of the knee demonstrated severe degenerative changes with full-thickness loss of articular cartilage. Osteophytes were debrided using a rongeur. Anterior and posterior cruciate ligaments were excised. Two 4.0 mm Schanz pins were inserted in the femur and into the tibia for attachment of the array of trackers used for computer-assisted navigation. Hip center was identified using a circumduction technique. Distal landmarks were mapped using the computer. The distal femur and proximal tibia were mapped using the computer. The distal femoral cutting guide was positioned using computer-assisted navigation so as to achieve a 5 distal valgus cut. The femur was sized and it was felt that a size 4 femoral component was appropriate. A size 4 femoral cutting guide was positioned and the anterior cut was performed and verified using the computer. This was followed by completion of the posterior and chamfer cuts. Femoral cutting guide for the central box was then positioned in the center box cut was performed.  Attention was then directed to the proximal tibia. Medial and lateral menisci were excised. The extramedullary tibial cutting guide was positioned using computer-assisted navigation so as to achieve a 0 varus-valgus alignment and 3  posterior slope. The cut was performed and verified using the  computer. The proximal tibia was sized and it was felt that a size 4 tibial tray was appropriate. Tibial and femoral trials were inserted followed by insertion of a 5 mm polyethylene insert. This allowed for excellent mediolateral soft tissue balancing both in flexion and in full extension. Finally, the patella was cut and prepared so as to accommodate a 35 mm medialized dome patella. A patella trial was placed and the knee was placed through a range of motion with excellent patellar tracking appreciated. The femoral trial was removed after debridement of posterior osteophytes. The central post-hole for the tibial component was reamed followed by insertion of a keel punch. Tibial trials were then removed. Cut surfaces of bone were irrigated with copious amounts of normal saline with antibiotic solution using pulsatile lavage and then suctioned dry. Polymethylmethacrylate cement was prepared in the usual fashion using a vacuum mixer. Cement was applied to the cut surface of the proximal tibia as well as along the undersurface of a size 4 rotating platform tibial component. Tibial component was positioned and impacted into place. Excess cement was removed using [employer] fast streamer. Cement was then applied to the cut surfaces of the femur as well as along the posterior flanges of the size 4 femoral component. The femoral component was positioned and impacted into place. Excess cement was removed using [employer] fast streamer. A 5 mm polyethylene trial was inserted and the knee was brought into full extension with steady axial compression applied. Finally, cement was applied to the backside of a 35 mm medialized dome patella and the patellar component was positioned and patellar clamp applied. Excess cement was removed using [employer] fast streamer. After adequate curing of the cement, the tourniquet was deflated after a total tourniquet time of 80 minutes. Hemostasis was  achieved using electrocautery. The knee was irrigated with copious amounts of normal saline with antibiotic solution using pulsatile lavage and then suctioned dry. 20 mL of 1.3% Exparel and 60 mL of 0.25% Marcaine in 40 mL of normal saline was injected along the posterior capsule, medial and lateral gutters, and along the arthrotomy site. A 5 mm stabilized rotating platform polyethylene insert was inserted and the knee was placed through a range of motion with excellent mediolateral soft tissue balancing appreciated and excellent patellar tracking noted. 2 medium drains were placed in the wound bed and brought out through separate stab incisions. The medial parapatellar portion of the incision was reapproximated using interrupted sutures of #1 Vicryl. Subcutaneous tissue was approximated in layers using first #0 Vicryl followed #2-0 Vicryl. The skin was approximated with skin staples. A sterile dressing was applied.  The patient tolerated the procedure well and was transported to the recovery room in stable condition.    James P. Holley Bouche., M.D.

## 2017-08-28 NOTE — H&P (Signed)
The patient has been re-examined, and the chart reviewed, and there have been no interval changes to the documented history and physical.    The risks, benefits, and alternatives have been discussed at length. The patient expressed understanding of the risks benefits and agreed with plans for surgical intervention.  Harlee Eckroth P. Kathe Wirick, Jr. M.D.    

## 2017-08-28 NOTE — Anesthesia Post-op Follow-up Note (Signed)
Anesthesia QCDR form completed.        

## 2017-08-28 NOTE — Anesthesia Procedure Notes (Signed)
Spinal  Patient location during procedure: OR Start time: 08/28/2017 11:31 AM End time: 08/28/2017 11:41 AM Staffing Resident/CRNA: Bernardo Heater, CRNA Performed: resident/CRNA  Preanesthetic Checklist Completed: patient identified, site marked, surgical consent, pre-op evaluation, timeout performed, IV checked, risks and benefits discussed and monitors and equipment checked Spinal Block Patient position: sitting Prep: ChloraPrep Patient monitoring: heart rate, continuous pulse ox, blood pressure and cardiac monitor Approach: midline Location: L3-4 Injection technique: single-shot Needle Needle type: Introducer and Pencan  Needle gauge: 24 G Needle length: 9 cm Additional Notes Negative paresthesia. Negative blood return. Positive free-flowing CSF. Expiration date of kit checked and confirmed. Patient tolerated procedure well, without complications.

## 2017-08-28 NOTE — Anesthesia Preprocedure Evaluation (Signed)
Anesthesia Evaluation  Patient identified by MRN, date of birth, ID band Patient awake    Reviewed: Allergy & Precautions, H&P , NPO status , Patient's Chart, lab work & pertinent test results, reviewed documented beta blocker date and time   History of Anesthesia Complications (+) PONV and history of anesthetic complications  Airway Mallampati: III   Neck ROM: full    Dental  (+) Teeth Intact   Pulmonary neg pulmonary ROS, Current Smoker,    Pulmonary exam normal        Cardiovascular Exercise Tolerance: Good negative cardio ROS Normal cardiovascular exam Rhythm:regular Rate:Normal     Neuro/Psych  Headaches, PSYCHIATRIC DISORDERS Anxiety Depression Bipolar Disorder Schizophrenia  Neuromuscular disease negative neurological ROS  negative psych ROS   GI/Hepatic negative GI ROS, Neg liver ROS, GERD  Medicated,  Endo/Other  negative endocrine ROSdiabetes, Poorly Controlled, Type 2, Oral Hypoglycemic Agents  Renal/GU negative Renal ROS  negative genitourinary   Musculoskeletal   Abdominal   Peds  Hematology negative hematology ROS (+)   Anesthesia Other Findings Past Medical History: No date: Anxiety No date: Bipolar disorder ([place]) No date: Complication of anesthesia No date: Degenerative disc disease, lumbar No date: Depression No date: Diabetes mellitus No date: Elevated cholesterol No date: Fibromyalgia No date: GERD (gastroesophageal reflux disease) No date: Migraine No date: Osteoarthritis No date: PONV (postoperative nausea and vomiting) No date: PTSD (post-traumatic stress disorder)     Comment:  after death of son (accidental overdose) No date: Thyroid disease Past Surgical History: bilateral: CARPAL TUNNEL RELEASE No date: CHOLECYSTECTOMY 05/01/2012: COLONOSCOPY WITH PROPOFOL; N/A     Comment:  IWP:[id number] COLON polyps/Mild diverticulosis was noted in               the sigmoid colon/Moderate sized  internal hemorrhoids.               path with tubular adenoma No date: gallbladder No date: KNEE ARTHROSCOPY     Comment:  bilateral 10/29/2015: KNEE ARTHROSCOPY WITH MEDIAL MENISECTOMY; Right     Comment:  Procedure: ARTHROSCOPY RIGHT KNEE  WITH PARTIAL MEDIAL               MENISECTOMY;  Surgeon: Carole Civil, MD;  Location:              [facility];  Service: Orthopedics;  Laterality: Right; 05/01/2012: POLYPECTOMY; N/A     Comment:  Procedure: POLYPECTOMY;  Surgeon: Danie Binder, MD;                Location: [facility];  Service: Endoscopy;  Laterality: N/A; left: SHOULDER SURGERY No date: VAGINAL HYSTERECTOMY   Reproductive/Obstetrics negative OB ROS                             Anesthesia Physical Anesthesia Plan  ASA: III  Anesthesia Plan: General and Spinal   Post-op Pain Management:    Induction:   PONV Risk Score and Plan:   Airway Management Planned:   Additional Equipment:   Intra-op Plan:   Post-operative Plan:   Informed Consent: I have reviewed the patients History and Physical, chart, labs and discussed the procedure including the risks, benefits and alternatives for the proposed anesthesia with the patient or authorized representative who has indicated his/her understanding and acceptance.   Dental Advisory Given  Plan Discussed with: CRNA  Anesthesia Plan Comments: (Pt with intermittent history of low back pain.  Risks and benefits of  SAB discussed with her and she realizes the potential for worsening low back pain especially in the immediate peroperative period.  She does not want general anesthesia if this can be avoided with an ETT.  JA)        Anesthesia Quick Evaluation

## 2017-08-28 NOTE — Anesthesia Procedure Notes (Signed)
Procedure Name: LMA Insertion Date/Time: 08/28/2017 11:52 AM Performed by: Bernardo Heater, CRNA Oxygen Delivery Method: Circle system utilized Preoxygenation: Pre-oxygenation with 100% oxygen Induction Type: IV induction LMA: LMA inserted LMA Size: 4.0 Number of attempts: 1 Placement Confirmation: positive ETCO2 Tube secured with: Tape Dental Injury: Teeth and Oropharynx as per pre-operative assessment

## 2017-08-29 ENCOUNTER — Other Ambulatory Visit: Payer: Self-pay

## 2017-08-29 LAB — GLUCOSE, CAPILLARY
Glucose-Capillary: 127 mg/dL — ABNORMAL HIGH (ref 65–99)
Glucose-Capillary: 192 mg/dL — ABNORMAL HIGH (ref 65–99)
Glucose-Capillary: 198 mg/dL — ABNORMAL HIGH (ref 65–99)

## 2017-08-29 MED ORDER — TRAMADOL HCL 50 MG PO TABS: 50.0000 mg | ORAL_TABLET | ORAL | 0 refills | Status: DC | PRN

## 2017-08-29 MED ORDER — PNEUMOCOCCAL VAC POLYVALENT 25 MCG/0.5ML IJ INJ
0.5000 mL | INJECTION | INTRAMUSCULAR | Status: AC
  Administered 2017-08-30: 0.5 mL via INTRAMUSCULAR
  Filled 2017-08-29: qty 0.5

## 2017-08-29 MED ORDER — ENOXAPARIN SODIUM 40 MG/0.4ML ~~LOC~~ SOLN: 40.0000 mg | SUBCUTANEOUS | 0 refills | Status: DC

## 2017-08-29 NOTE — Progress Notes (Signed)
Physical Therapy Treatment Patient Details Name: [name] MRN: [id number] DOB: 1960/05/12 Today's Date: 08/29/2017    History of Present Illness Pt admitted after elective R TKR by Dr. Marry Guan. Pt has a history of PTSD, GERD, fibromyalgia, diabetes, depression and bipolar disorder.    PT Comments    Pt reports that her knee is feeling well. She states that her pain is still controlled via pain medication and even states she "expected to have more pain." Pt was independent with all bed mobility and modified independent with transfers. Pt able to amb 200' with CGA and minimal verbal cuing for RW safety. Pt requires minimal verbal cuing and CGA for HEP. PT will attempt stair training tomorrow morning. Pt displays good endurance and is able to complete treatment stating at the end of the session that she could "do it all again" PT will continue to see pt BID while admitted. D/c recommendation continues to be home with home health PT.      Follow Up Recommendations  Home health PT     Equipment Recommendations  Rolling walker with 5" wheels;3in1 (PT)    Recommendations for Other Services       Precautions / Restrictions Precautions Precautions: Knee;Fall Precaution Booklet Issued: Yes (comment)(HEP) Required Braces or Orthoses: (can perform 10 SLR independently no KI needed) Restrictions Weight Bearing Restrictions: Yes(wbat)    Mobility  Bed Mobility Overal bed mobility: Independent             General bed mobility comments: pt independent in all bed mobility. Required minimal cuing sit>supine for line management.  Transfers Overall transfer level: Modified independent Equipment used: Rolling walker (2 wheeled)             General transfer comment: pt modified independent sit<>stand to RW. pt required minimal verbal and tactile cuing prior to transfer for hand placement to increase safety.  Ambulation/Gait Ambulation/Gait assistance: Min guard Ambulation  Distance (Feet): 200 Feet Assistive device: Rolling walker (2 wheeled) Gait Pattern/deviations: Step-through pattern;Decreased stride length Gait velocity: 10' in 19 seconds   General Gait Details: pt amb 200' total with CGA and a RW. Pt required minimal cuing for RW use, and upright posture. Pt tends to look down at feet which causes increased forward lean and knee hyperextension, easily corrected with verbal cuing.   Stairs             Wheelchair Mobility    Modified Rankin (Stroke Patients Only)       Balance Overall balance assessment: Needs assistance   Sitting balance-Leahy Scale: Good Sitting balance - Comments: pt displays good sitting balance EOB with B feet flat on floor     Standing balance-Leahy Scale: Good Standing balance comment: Pt demonstrates good standing balance with B UE support on RW.                            Cognition Arousal/Alertness: Awake/alert Behavior During Therapy: WFL for tasks assessed/performed Overall Cognitive Status: Within Functional Limits for tasks assessed                                        Exercises Total Joint Exercises Goniometric ROM: AAROM of R knee 3-95 degrees Other Exercises Other Exercises: pt instructed in HEP for R LE including x10 reps ankle pumps, quad sets, SLR, SAQ, and heel slides. pt required minimal  cuing for technique. packet full of exercises reviewed and given to the patient. Other Exercises: pt instructed with toileting activites. pt required CGA sit<>stand and CGA while amb 15' from bed to toilet. Pt required cuing for RW use and assistance managing lines. Pt displayed poor safety awareness by transfering sit>stand from toilet independenly despite cuing from PT to wait for help. Pt had BM, nursing staff notifed.    General Comments        Pertinent Vitals/Pain Pain Assessment: 0-10 Pain Score: 6  Pain Location: R knee Pain Descriptors / Indicators: Operative site  guarding Pain Intervention(s): Limited activity within patient's tolerance;Monitored during session;Ice applied;Premedicated before session    Home Living Family/patient expects to be discharged to:: Private residence Living Arrangements: Spouse/significant other Available Help at Discharge: Family(boyfriend of 25 years, 24/7 assistance) Type of Home: House Home Access: Stairs to enter Entrance Stairs-Rails: Can reach both Home Layout: One level Home Equipment: Aquilla - single point      Prior Function Level of Independence: Independent with assistive device(s)(single point cane)      Comments: 6 falls in last 12 months, due to operative knee buckling   PT Goals (current goals can now be found in the care plan section) Acute Rehab PT Goals Patient Stated Goal: I want to get stronger PT Goal Formulation: With patient Time For Goal Achievement: 09/12/17 Potential to Achieve Goals: Good Progress towards PT goals: Progressing toward goals    Frequency    BID      PT Plan Current plan remains appropriate    Co-evaluation              AM-PAC PT "6 Clicks" Daily Activity  Outcome Measure  Difficulty turning over in bed (including adjusting bedclothes, sheets and blankets)?: A Little Difficulty moving from lying on back to sitting on the side of the bed? : A Little Difficulty sitting down on and standing up from a chair with arms (e.g., wheelchair, bedside commode, etc,.)?: A Little Help needed moving to and from a bed to chair (including a wheelchair)?: A Little Help needed walking in hospital room?: A Little Help needed climbing 3-5 steps with a railing? : A Little 6 Click Score: 18    End of Session Equipment Utilized During Treatment: Gait belt Activity Tolerance: Patient tolerated treatment well Patient left: in bed;with bed alarm set;with call bell/phone within reach;with family/visitor present;with SCD's reapplied Nurse Communication: Mobility status(mobility  status as well as pt had a BM.) PT Visit Diagnosis: Unsteadiness on feet (R26.81);Repeated falls (R29.6);Muscle weakness (generalized) (M62.81);History of falling (Z91.81);Difficulty in walking, not elsewhere classified (R26.2)     Time: 7564-3329 PT Time Calculation (min) (ACUTE ONLY): 31 min  Charges:  $Therapeutic Exercise: 8-22 mins $Therapeutic Activity: 8-22 mins                    G Codes:       Zakkery Dorian Marylu Lund, SPT    Gabrial Poppell 08/29/2017, 2:39 PM

## 2017-08-29 NOTE — Discharge Summary (Signed)
Physician Discharge Summary  Patient ID: [name] MRN: [id number] DOB/AGE: 59-Jun-1962 59 y.o.  Admit date: 08/28/2017 Discharge date: 08/30/2017  Admission Diagnoses:  PRIMARY OSTEOARTHRITIS OF RIGHT KNEE   Discharge Diagnoses: Patient Active Problem List   Diagnosis Date Noted  . S/P total knee arthroplasty 08/28/2017  . 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 Palpitations 06/01/2016  . Hyperlipidemia 06/01/2016  . Tobacco abuse 06/01/2016  . Dyspnea on exertion 05/16/2016  . Left leg swelling 05/16/2016  . Diabetes mellitus ([place]) 05/16/2016  . Chest pain   . Swelling   . Acute medial meniscus tear of left knee   . Post traumatic stress disorder (PTSD) 11/08/2014  . MDD (major depressive disorder), recurrent severe, without psychosis ([place]) 11/08/2014  . Substance-induced psychotic disorder with hallucinations ([place]) 11/08/2014  . Panic disorder 11/08/2014  . Chronic pain syndrome 11/21/2013  . Primary osteoarthritis of both knees 11/21/2013  . Spinal stenosis of lumbar region 11/21/2013  . Difficulty in walking(719.7) 10/14/2013  . Sciatica associated with disorder of lumbar spine 09/17/2013  . Lower back pain 08/08/2013  . Lateral meniscus, posterior horn derangement 06/13/2013  . Arthritis of right knee 06/13/2013  . S/P medial meniscectomy of right knee 06/13/2013  . Left shoulder pain 08/28/2012  . Rotator cuff syndrome of left shoulder 08/28/2012  . Rotator cuff tear 08/28/2012  . Pes anserinus bursitis 05/16/2012  . Encounter for screening colonoscopy 04/16/2012  . Fatty liver 04/16/2012  . Back pain 07/26/2011  . Knee bursitis, left 07/26/2011  . OA (osteoarthritis) of knee 07/26/2011  . Spinal stenosis 10/28/2010  . Medial meniscus, posterior horn derangement 08/12/2010  . Knee pain 08/04/2010  . Sciatica 08/04/2010  . H N P-LUMBAR 01/14/2010  . CHONDROMALACIA OF PATELLA 07/08/2009   . LUMBOSACRAL STRAIN 05/12/2009  . DERANGEMENT OF POSTERIOR HORN OF MEDIAL MENISCUS 01/19/2009  . BACK PAIN 01/19/2009  . LOWER LEG, ARTHRITIS, DEGEN./OSTEO 12/02/2008  . JOINT EFFUSION, KNEE 11/10/2008  . ANSERINE BURSITIS, RIGHT 11/10/2008  . DERANGEMENT MENISCUS 10/15/2008  . KNEE PAIN 10/15/2008    Past Medical History:  Diagnosis Date  . Anxiety   . Bipolar disorder ([place])   . Complication of anesthesia   . Degenerative disc disease, lumbar   . Depression   . Diabetes mellitus   . Elevated cholesterol   . Fibromyalgia   . GERD (gastroesophageal reflux disease)   . Migraine   . Osteoarthritis   . PONV (postoperative nausea and vomiting)   . PTSD (post-traumatic stress disorder)    after death of son (accidental overdose)  . Thyroid disease      Transfusion: No transfusions during this admission   Consultants (if any):   Discharged Condition: Improved  Hospital Course: [name] is an 57 y.o. female who was admitted 08/28/2017 with a diagnosis of degenerative arthrosis right knee and went to the operating room on 08/28/2017 and underwent the above named procedures.    Surgeries:Procedure(s): COMPUTER ASSISTED TOTAL KNEE ARTHROPLASTY on 08/28/2017  PRE-OPERATIVE DIAGNOSIS: Degenerative arthrosis of the right knee, primary  POST-OPERATIVE DIAGNOSIS:  Same  PROCEDURE:  Right total knee arthroplasty using computer-assisted navigation  SURGEON:  Marciano Sequin. M.D.  ASSISTANT:  Vance Peper, PA (present and scrubbed throughout the case, critical for assistance with exposure, retraction, instrumentation, and closure)  ANESTHESIA: spinal and general  ESTIMATED BLOOD LOSS: 50 mL  FLUIDS REPLACED: 1000 mL of crystalloid  TOURNIQUET TIME: 80 minutes  DRAINS: 2 medium  Hemovac drains  SOFT TISSUE RELEASES: Anterior cruciate ligament, posterior cruciate ligament, deep medial collateral ligament, patellofemoral ligament  IMPLANTS UTILIZED: DePuy  Attune size 4 posterior stabilized femoral component (cemented), size 4 rotating platform tibial component (cemented), 35 mm medialized dome patella (cemented), and a 5 mm stabilized rotating platform polyethylene insert.  INDICATIONS FOR SURGERY: [name] is a 57 y.o. year old female with a long history of progressive knee pain. X-rays demonstrated severe degenerative changes in tricompartmental fashion. The patient had not seen any significant improvement despite conservative nonsurgical intervention. After discussion of the risks and benefits of surgical intervention, the patient expressed understanding of the risks benefits and agree with plans for total knee arthroplasty.   The risks, benefits, and alternatives were discussed at length including but not limited to the risks of infection, bleeding, nerve injury, stiffness, blood clots, the need for revision surgery, cardiopulmonary complications, among others, and they were willing to proceed.   Patient tolerated the surgery well. No complications .Patient was taken to PACU where she was stabilized and then transferred to the orthopedic floor.  Patient started on Lovenox 30 mg q 12 hrs. Foot pumps applied bilaterally at 80 mm hgb. Heels elevated off bed with rolled towels. No evidence of DVT. Calves non tender. Negative Homan. Physical therapy started on day #1 for gait training and transfer with OT starting on  day #1 for ADL and assisted devices. Patient has done well with therapy. Ambulated greater than 200 feet upon being discharged.  Was able to ascend and descend 4 steps safely and independently  Patient's IV And Foley were discontinued on day #1 with Hemovac being discontinued on day #2. Dressing was changed on day 2 prior to patient being discharged   She was given perioperative antibiotics:  Anti-infectives (From admission, onward)   Start     Dose/Rate Route Frequency Ordered Stop   08/28/17 1715  ceFAZolin (ANCEF) IVPB  2g/100 mL premix     2 g 200 mL/hr over 30 Minutes Intravenous Every 6 hours 08/28/17 1704 08/29/17 1714   08/28/17 0942  ceFAZolin (ANCEF) 2-4 GM/100ML-% IVPB    Note to Pharmacy:  Phineas Real   : cabinet override      08/28/17 0942 08/28/17 1746   08/28/17 0600  ceFAZolin (ANCEF) IVPB 2g/100 mL premix  Status:  Discontinued     2 g 200 mL/hr over 30 Minutes Intravenous On call to O.R. 08/27/17 2221 08/28/17 1008    .  She was fitted with AV 1 compression foot pump devices, instructed on heel pumps, early ambulation, and fitted with TED stockings bilaterally for DVT prophylaxis.  She benefited maximally from the hospital stay and there were no complications.    Recent vital signs:  Vitals:   08/29/17 0221 08/29/17 0509  BP: (!) 99/59 99/72  Pulse: 76 72  Resp:    Temp:    SpO2: 99% 99%    Recent laboratory studies:  Lab Results  Component Value Date   HGB 15.6 08/16/2017   HGB 16.7 (H) 10/27/2016   HGB 13.7 05/16/2016   Lab Results  Component Value Date   WBC 9.0 08/16/2017   PLT 321 08/16/2017   Lab Results  Component Value Date   INR 0.92 08/16/2017   Lab Results  Component Value Date   NA 133 (L) 08/16/2017   K 4.3 08/16/2017   CL 101 08/16/2017   CO2 21 (L) 08/16/2017   BUN 21 (H) 08/16/2017   CREATININE 0.82 08/16/2017  GLUCOSE 137 (H) 08/16/2017    Discharge Medications:   Allergies as of 08/29/2017   No Known Allergies     Medication List    STOP taking these medications   aspirin EC 81 MG tablet   IBU 800 MG tablet Generic drug:  ibuprofen     TAKE these medications   amphetamine-dextroamphetamine 15 MG 24 hr capsule Commonly known as:  ADDERALL XR Take 1 capsule by mouth every morning.   BIOFREEZE EX Apply 1 application topically 3 (three) times daily as needed (for pain).   buPROPion 100 MG 12 hr tablet Commonly known as:  WELLBUTRIN SR Take 1 tablet (100 mg total) by mouth daily.   canagliflozin 300 MG Tabs  tablet Commonly known as:  INVOKANA Take 300 mg by mouth daily.   clonazePAM 0.5 MG tablet Commonly known as:  KLONOPIN Take 1 tablet (0.5 mg total) by mouth 2 (two) times daily as needed for anxiety. What changed:  when to take this   enoxaparin 40 MG/0.4ML injection Commonly known as:  LOVENOX Inject 0.4 mLs (40 mg total) into the skin daily for 14 days. Start taking on:  08/31/2017   gabapentin 800 MG tablet Commonly known as:  NEURONTIN Take 1,600 mg by mouth 2 (two) times daily.   glipiZIDE 5 MG 24 hr tablet Commonly known as:  GLUCOTROL XL Take 5 mg by mouth daily with breakfast.   levothyroxine 150 MCG tablet Commonly known as:  SYNTHROID, LEVOTHROID Take 150 mcg by mouth daily before breakfast.   metFORMIN 500 MG 24 hr tablet Commonly known as:  GLUCOPHAGE-XR Take 1,000 mg by mouth 2 (two) times daily.   methocarbamol 500 MG tablet Commonly known as:  ROBAXIN Take 1 tablet (500 mg total) by mouth 4 (four) times daily.   MOVANTIK 25 MG Tabs tablet Generic drug:  naloxegol oxalate Take 25 mg by mouth daily.   Oxycodone HCl 10 MG Tabs Take 10 mg by mouth 3 (three) times daily as needed (for pain).   pantoprazole 40 MG tablet Commonly known as:  PROTONIX Take 40 mg by mouth daily before breakfast.   pioglitazone 15 MG tablet Commonly known as:  ACTOS Take 15 mg by mouth daily.   QUEtiapine 200 MG tablet Commonly known as:  SEROQUEL Take 1 tablet (200 mg total) by mouth at bedtime.   RELISTOR 150 MG Tabs Generic drug:  Methylnaltrexone Bromide Take 150 mg by mouth daily.   simvastatin 40 MG tablet Commonly known as:  ZOCOR Take 1 tablet (40 mg total) by mouth daily. What changed:  when to take this   SUMAtriptan 100 MG tablet Commonly known as:  IMITREX One tablet po prn migraine.  May repeat in 2 hrs if needed.  Do not exceed 200 mg per 24 hrs.   traMADol 50 MG tablet Commonly known as:  ULTRAM Take 1-2 tablets (50-100 mg total) by mouth every 4  (four) hours as needed for moderate pain.   TRULICITY 1.5 ZO/1.0RU Sopn Generic drug:  Dulaglutide Inject 0.5 mLs into the skin every Saturday.            Durable Medical Equipment  (From admission, onward)        Start     Ordered   08/28/17 1704  DME Walker rolling  Once    Question:  Patient needs a walker to treat with the following condition  Answer:  Total knee replacement status   08/28/17 1704   08/28/17 1704  DME Bedside commode  Once    Question:  Patient needs a bedside commode to treat with the following condition  Answer:  Total knee replacement status   08/28/17 1704      Diagnostic Studies: Dg Knee Right Port  Result Date: 08/28/2017 CLINICAL DATA:  Post knee surgery. EXAM: PORTABLE RIGHT KNEE - 1-2 VIEW COMPARISON:  RIGHT knee radiograph May 25, 2017 FINDINGS: Status post total knee arthroplasty with intact well seated femoral and tibial components. Pin tracks distal femur proximal tibia. Expected appearance of resurfaced patella. No destructive bony lesions. Surgical drain in place with subcutaneous gas and skin staples. IMPRESSION: Status post total knee arthroplasty with surgical drain in place. Electronically Signed   By: Elon Alas M.D.   On: 08/28/2017 15:06    Disposition:     Follow-up Information    Watt Climes, PA On 09/12/2017.   Specialty:  Physician Assistant Why:  at 9:45am Contact information: [place] [address] [phone]        Dereck Leep, MD On 10/12/2017.   Specialty:  Orthopedic Surgery Why:  at 10:00am Contact information: [place] [address] [phone]            Signed: Watt Climes 08/29/2017, 7:26 AM

## 2017-08-29 NOTE — Evaluation (Signed)
Occupational Therapy Evaluation Patient Details Name: [name] MRN: [id number] DOB: 07-23-1960 Today's Date: 08/29/2017    History of Present Illness Pt admitted after elective R TKR by Dr. Marry Guan. Pt has a history of PTSD, GERD, fibromyalgia, diabetes, depression and bipolar disorder.   Clinical Impression   Pt seen for OT evaluation this date, POD#1 from above surgery. Pt was independent in all ADLs prior to surgery, however using SPC for mobility due to R knee pain and back pain. Pt is eager to return to PLOF with less pain and improved safety and independence. Pt currently requires minimal assist for LB dressing while in seated position due to pain and limited AROM of R knee. Pt/family instructed in polar care mgt, falls prevention strategies, home/routines modifications, DME/AE for LB bathing and dressing tasks, compression stocking mgt, pet care mgt considerations, and functional mobility training within context of the kitchen and bathroom with RW. Pt would benefit from skilled OT services including additional instruction in dressing techniques with or without assistive devices for dressing and bathing skills to support recall and carryover prior to discharge and ultimately to maximize safety, independence, and minimize falls risk and caregiver burden. Do not currently anticipate any OT needs following this hospitalization.      Follow Up Recommendations  No OT follow up    Equipment Recommendations  3 in 1 bedside commode(RNCM notified)    Recommendations for Other Services       Precautions / Restrictions Precautions Precautions: Knee;Fall Required Braces or Orthoses: (can perform 10 SLR independently no KI needed) Restrictions Weight Bearing Restrictions: Yes RLE Weight Bearing: Weight bearing as tolerated      Mobility Bed Mobility Overal bed mobility: Independent             General bed mobility comments: indep, no difficulties  Transfers Overall transfer  level: Modified independent Equipment used: Rolling walker (2 wheeled)             General transfer comment: pt modified independent sit<>stand to RW. pt required minimal verbal and tactile cuing prior to transfer for hand placement to increase safety.    Balance Overall balance assessment: Needs assistance Sitting-balance support: No upper extremity supported;Feet supported Sitting balance-Leahy Scale: Good Sitting balance - Comments: pt displays good sitting balance EOB with B feet flat on floor     Standing balance-Leahy Scale: Good Standing balance comment: Pt demonstrates good standing balance with B UE support on RW.                           ADL either performed or assessed with clinical judgement   ADL Overall ADL's : Needs assistance/impaired Eating/Feeding: Independent   Grooming: Independent   Upper Body Bathing: Sitting;Independent   Lower Body Bathing: Sit to/from stand;Minimal assistance;With caregiver independent assisting Lower Body Bathing Details (indicate cue type and reason): pt/family educated in use of BSC frame in shower as shower chair once cleared for formal shower Upper Body Dressing : Independent;Sitting   Lower Body Dressing: Sit to/from stand;Minimal assistance;With caregiver independent assisting Lower Body Dressing Details (indicate cue type and reason): pt/family educated in use of AE for Lb dressing and techniques for donning/doffing and managing compression stockings Toilet Transfer: RW;Min guard;Ambulation Toilet Transfer Details (indicate cue type and reason): BSC over toilet Toileting- Clothing Manipulation and Hygiene: Independent       Functional mobility during ADLs: Min guard;Rolling walker       Vision Baseline Vision/History: Wears glasses  Wears Glasses: Reading only Patient Visual Report: No change from baseline       Perception     Praxis      Pertinent Vitals/Pain Pain Assessment: 0-10 Pain Score: 7   Pain Location: R knee Pain Descriptors / Indicators: Operative site guarding;Aching Pain Intervention(s): Limited activity within patient's tolerance;Monitored during session;RN gave pain meds during session;Ice applied     Hand Dominance     Extremity/Trunk Assessment Upper Extremity Assessment Upper Extremity Assessment: Overall WFL for tasks assessed   Lower Extremity Assessment Lower Extremity Assessment: Overall WFL for tasks assessed;RLE deficits/detail;Defer to PT evaluation RLE Deficits / Details: R DF 5/5, able to perform 10 SLR with CGA RLE Sensation: WNL   Cervical / Trunk Assessment Cervical / Trunk Assessment: Normal   Communication Communication Communication: No difficulties   Cognition Arousal/Alertness: Awake/alert Behavior During Therapy: WFL for tasks assessed/performed Overall Cognitive Status: Within Functional Limits for tasks assessed                                     General Comments       Exercises Other Exercises Other Exercises: Pt/family educated in use of BSC beside bed over night for toileting, overtop standard height toilet during the day, and as shower chair. Pt verbalized understanding. Other Exercises: Pt/family educated in considerations/falls prevention strategies for pet care.  Other Exercises: Pt/family educated in functional mobility training within kitchen/bathroom context to maximize independence and safety   Shoulder Instructions      Home Living Family/patient expects to be discharged to:: Private residence Living Arrangements: Spouse/significant other Available Help at Discharge: Family(boyfriend of 25 years 24/7 assistance, dtr PRN assist) Type of Home: House Home Access: Stairs to enter [employer] of Steps: 5 Entrance Stairs-Rails: Can reach both [place]: One level     Bathroom Shower/Tub: Walk-in shower;Door   [employer] Toilet: Standard     Home Equipment: [employer] - single point           Prior Functioning/Environment Level of Independence: Independent with assistive device(s)        Comments: Pt ambulating with SPC, 6 falls in past 12 months 2:2 operative knee buckling, Pt enjoys reading, crafts, and gardening. Pt has dogs and cats in the house.        OT Problem List: Decreased strength;Decreased knowledge of use of DME or AE;Decreased range of motion;Pain      OT Treatment/Interventions: Self-care/ADL training;Therapeutic exercise;Therapeutic activities;DME and/or AE instruction;Patient/family education    OT Goals(Current goals can be found in the care plan section) Acute Rehab OT Goals Patient Stated Goal: I want to get stronger OT Goal Formulation: With patient/family Time For Goal Achievement: 09/12/17 Potential to Achieve Goals: Good ADL Goals Pt Will Perform Lower Body Dressing: with supervision;sit to/from stand;with adaptive equipment Pt Will Transfer to Toilet: with supervision;ambulating(BSC over toilet, RW for amb) Additional ADL Goal #1: Family will demo understanding of donning/doffing compression stockings to maximize and adherence safety during recovery.  OT Frequency: Min 1X/week   Barriers to D/C:            Co-evaluation              AM-PAC PT "6 Clicks" Daily Activity     Outcome Measure Help from another person eating meals?: None Help from another person taking care of personal grooming?: None Help from another person toileting, which includes using toliet, bedpan, or urinal?: None  Help from another person bathing (including washing, rinsing, drying)?: A Little Help from another person to put on and taking off regular upper body clothing?: None Help from another person to put on and taking off regular lower body clothing?: A Little 6 Click Score: 22   End of Session    Activity Tolerance: Patient tolerated treatment well Patient left: in bed;with call bell/phone within reach;with bed alarm set;with nursing/sitter in  room;with family/visitor present;with SCD's reapplied;Other (comment)(polar care in place)  OT Visit Diagnosis: Other abnormalities of gait and mobility (R26.89);Pain Pain - Right/Left: Right Pain - part of body: Knee                Time: 6767-2094 OT Time Calculation (min): 29 min Charges:  OT General Charges $OT Visit: 1 Visit OT Evaluation $OT Eval Low Complexity: 1 Low OT Treatments $Self Care/Home Management : 8-22 mins  Jeni Salles, MPH, MS, OTR/L ascom [phone] 08/29/17, 3:09 PM

## 2017-08-29 NOTE — Progress Notes (Signed)
ORTHOPAEDICS PROGRESS NOTE  PATIENT NAME: [name] DOB: 30-Aug-1960  MRN: [id number]  POD # 1: Right total knee arthroplasty  Subjective: The patient rested well last night.  Pain has been under good control. She does report some discomfort along the hamstrings with the bone foam in place. The patient sat at the bedside as per nursing.  Objective: Vital signs in last 24 hours: Temp:  [97.2 F (36.2 C)-99.1 F (37.3 C)] 98.2 F (36.8 C) (06/10 1925) Pulse Rate:  [72-89] 72 (06/11 0509) Resp:  [13-20] 18 (06/10 1800) BP: (90-112)/(58-73) 99/72 (06/11 0509) SpO2:  [96 %-100 %] 99 % (06/11 0509)  Intake/Output from previous day: 06/10 0701 - 06/11 0700 In: 1130 [I.V.:1100] Out: 2150 [Urine:2000; Drains:100; Blood:50]  No results for input(s): WBC, HGB, HCT, PLT, K, CL, CO2, BUN, CREATININE, GLUCOSE, CALCIUM, LABPT, INR in the last 72 hours.  EXAM General: Well-developed well-nourished female seen in no apparent discomfort. Lungs: clear to auscultation Cardiac: normal rate and regular rhythm Right lower extremity: Dressing is clean and dry.  The patient is able to perform an independent straight leg raise.  Homans test is negative.  Hemovac drain and Polar Care are in place and functioning. Neurologic: Awake, alert, and oriented.  Sensory and motor function are intact.  Assessment: Right total knee arthroplasty  Secondary diagnoses: Posttraumatic stress disorder History of migraines Gastroesophageal reflux disease Fibromyalgia Hypercholesterolemia Diabetes Depression Bipolar disorder  Plan: Today's goals were reviewed with the patient.  Begin physical therapy and Occupational Therapy as per total knee arthroplasty rehab protocol. Plan is to go Home after hospital stay. DVT Prophylaxis - Lovenox, Foot Pumps and TED hose  Myson Levi P. Holley Bouche M.D.

## 2017-08-29 NOTE — Evaluation (Signed)
Physical Therapy Evaluation Patient Details Name: [name] MRN: [id number] DOB: [birth year] Today's Date: 08/29/2017   History of Present Illness  Pt admitted after elective R TKR by Dr. Marry Guan. Pt has a history of PTSD, GERD, fibromyalgia, diabetes, depression and bipolar disorder.  Clinical Impression  Pt is a pleasant 57 year old femal who was admitted for a R TKR performed by Dr. Marry Guan. Pt performs bed mobility and transfers modified independenly and ambulation with contact guard assistance and a RW. Pt demonstrates deficits with strength, ROM, and mobility. Pt demonstrates ability to perform 10 SLRs with independence, therefore does not require KI for mobility. Pts sensation of R LE fully intact. Pt states that her pain at the onset of therapy was a 5/10, however has remained relatively controlled via pain medication. Pt ambulated a total of 75~ with CGA and cuing for RW use, appropriate stride length and upright posture. Pt AAROM of R knee 3-95. Pt was instructed verbally and through demonstration HEP. Pt declined any fellings of being light headed or dizzy throughout session. Pt tolerated treatment well. PT will continue to work with pt BID during her admission. At this time PT recommends d/c home with home health PT.     Follow Up Recommendations Home health PT    Equipment Recommendations  Rolling walker with 5" wheels;3in1 (PT)    Recommendations for Other Services       Precautions / Restrictions Precautions Precautions: Knee;Fall Precaution Booklet Issued: No Required Braces or Orthoses: (able to perform 10 SLR no KI needed) Restrictions Weight Bearing Restrictions: Yes(wbat)      Mobility  Bed Mobility Overal bed mobility: Independent             General bed mobility comments: pt independent in all bed mobility. Required minimal cuing supine>sit for line management.  Transfers Overall transfer level: Modified independent Equipment used: Rolling walker  (2 wheeled)             General transfer comment: pt modified independent sit<>stand to RW. pt required minimal verbal and tactile cuing prior to transfer for hand placement to increase safety as well as for line management.  Ambulation/Gait Ambulation/Gait assistance: Min guard Ambulation Distance (Feet): 75 Feet Assistive device: Rolling walker (2 wheeled) Gait Pattern/deviations: Step-through pattern;Decreased stride length     General Gait Details: pt amb 75' total including from bed to toilet and in the hallway. Pt required CGA and cuing for safe RW use, appropriate stride length, and upright posture. Pt initally demonstrated hyperextension of R knee during stance however this was corrected through cuing to shorten stride length.  Stairs            Wheelchair Mobility    Modified Rankin (Stroke Patients Only)       Balance Overall balance assessment: Needs assistance   Sitting balance-Leahy Scale: Good Sitting balance - Comments: pt displays good sitting balance EOB with B feet flat on floor     Standing balance-Leahy Scale: Good Standing balance comment: Pt demonstrates good standing balance with B UE support on RW.                             Pertinent Vitals/Pain Pain Assessment: 0-10 Pain Score: 5  Pain Location: R knee Pain Descriptors / Indicators: Operative site guarding Pain Intervention(s): Ice applied;Limited activity within patient's tolerance;Monitored during session    [place] expects to be discharged to:: Private residence Living Arrangements: Spouse/significant other  Available Help at Discharge: Family(boyfriend of 25 years, 24/7 assistance) Type of Home: House Home Access: Stairs to enter Entrance Stairs-Rails: Can reach both Entrance Stairs-Number of Steps: 5 Home Layout: One level Home Equipment: Cane - single point      Prior Function Level of Independence: Independent with assistive device(s)(single  point cane)         Comments: 6 falls in last 12 months, due to operative knee buckling     Hand Dominance        Extremity/Trunk Assessment   Upper Extremity Assessment Upper Extremity Assessment: Overall WFL for tasks assessed    Lower Extremity Assessment Lower Extremity Assessment: RLE deficits/detail;Overall WFL for tasks assessed RLE Deficits / Details: R DF 5/5, able to perform 10 SLR with CGA RLE Sensation: WNL       Communication   Communication: No difficulties  Cognition Arousal/Alertness: Awake/alert Behavior During Therapy: WFL for tasks assessed/performed Overall Cognitive Status: Within Functional Limits for tasks assessed                                        General Comments      Exercises Total Joint Exercises Goniometric ROM: AAROM of R knee 3-95 degrees Other Exercises Other Exercises: pt instructed in R LE ther-ex with CGA and cuing for technique and safety. x10 reps including SLR, ankle pumps, quad sets, glut stes, hip abduction, seated heel slides Other Exercises: pt instructed with toileting activites. pt required CGA sit<>stand and CGA while amb 15' from bed to toilet. Pt required cuing for RW use and assistance managing lines. Pt displayed poor safety awareness by transfering sit>stand from toilet independenly despite cuing from PT to wait for help. Pt had BM, nursing staff notifed.   Assessment/Plan    PT Assessment Patient needs continued PT services  PT Problem List Decreased strength;Decreased range of motion;Decreased activity tolerance;Decreased balance;Decreased mobility;Decreased knowledge of use of DME;Pain       PT Treatment Interventions DME instruction;Gait training;Stair training;Functional mobility training;Therapeutic activities;Therapeutic exercise;Balance training    PT Goals (Current goals can be found in the Care Plan section)  Acute Rehab PT Goals Patient Stated Goal: I want to get stronger PT Goal  Formulation: With patient Time For Goal Achievement: 09/12/17 Potential to Achieve Goals: Good    Frequency BID   Barriers to discharge        Co-evaluation               AM-PAC PT "6 Clicks" Daily Activity  Outcome Measure Difficulty turning over in bed (including adjusting bedclothes, sheets and blankets)?: A Little Difficulty moving from lying on back to sitting on the side of the bed? : A Little Difficulty sitting down on and standing up from a chair with arms (e.g., wheelchair, bedside commode, etc,.)?: A Little Help needed moving to and from a bed to chair (including a wheelchair)?: A Little Help needed walking in hospital room?: A Little Help needed climbing 3-5 steps with a railing? : A Lot 6 Click Score: 17    End of Session Equipment Utilized During Treatment: Gait belt Activity Tolerance: Patient tolerated treatment well Patient left: in chair;with call bell/phone within reach;with chair alarm set;with SCD's reapplied Nurse Communication: Mobility status(mobility status as well as pt had a BM.) PT Visit Diagnosis: Unsteadiness on feet (R26.81);Repeated falls (R29.6);Muscle weakness (generalized) (M62.81);History of falling (Z91.81);Difficulty in walking, not elsewhere classified (R26.2)  Time: 4315-4008 PT Time Calculation (min) (ACUTE ONLY): 47 min   Charges:   PT Evaluation $PT Eval Low Complexity: 1 Low PT Treatments $Therapeutic Exercise: 8-22 mins $Therapeutic Activity: 8-22 mins   PT G Codes:        Randell Detter, SPT   Charlane Westry 08/29/2017, 12:07 PM

## 2017-08-29 NOTE — Care Management Note (Addendum)
Case Management Note  Patient Details  Name: [name] MRN: [id number] Date of Birth: 1960-07-02  Subjective/Objective:  POD # 1 right total knee. Met with patient at bedside.she lives with her spouse who will assist her at discharge. Will need a walker and bsc. Ordered from [place] with [facility]. Offered a list of home care providers. Referral to [facility] for HHPT. Pharmacy: [facility] [phone]. Cost of Lovenox is $0 . Patient updated.                    Action/Plan: [facility] for HHPT, walker from [facility],   Expected Discharge Date:                  Expected Discharge Plan:  [place]  In-House Referral:     Discharge planning Services  CM Consult  Post Acute Care Choice:  Durable Medical Equipment, Home Health Choice offered to:  Patient  DME Arranged:  Walker rolling, bsc DME Agency:  [place]:  PT [place]:  [facility] ([facility])  Status of Service:  In process, will continue to follow  If discussed at Long Length of Stay Meetings, dates discussed:    Additional Comments:  Jolly Mango, RN 08/29/2017, 2:20 PM

## 2017-08-29 NOTE — Care Management (Signed)
Attempted to see patient. Pt working with patient. Will reattempt at a later time.

## 2017-08-30 ENCOUNTER — Encounter: Payer: Self-pay | Admitting: Orthopedic Surgery

## 2017-08-30 DIAGNOSIS — Z23 Encounter for immunization: Secondary | ICD-10-CM | POA: Diagnosis not present

## 2017-08-30 LAB — GLUCOSE, CAPILLARY: Glucose-Capillary: 162 mg/dL — ABNORMAL HIGH (ref 65–99)

## 2017-08-30 NOTE — Progress Notes (Signed)
OT Cancellation Note  Patient Details Name: [name] MRN: [id number] DOB: 03-13-1961   Cancelled Treatment:    Reason Eval/Treat Not Completed: Patient declined, no reason specified. Pt up in recliner upon attempt. Pt politely declines further OT services. Pt verbalizes understanding of education/training provided previous date and has no further questions or concerns.   Jeni Salles, MPH, MS, OTR/L ascom [phone] 08/30/17, 10:30 AM

## 2017-08-30 NOTE — Progress Notes (Signed)
Physical Therapy Treatment Patient Details Name: [name] MRN: [id number] DOB: 02/04/1961 Today's Date: 08/30/2017    History of Present Illness Pt admitted after elective R TKR by Dr. Hooten. Pt has a history of PTSD, GERD, fibromyalgia, diabetes, depression and bipolar disorder.    PT Comments    Pt reports that her knee is quite sore this morning. She states that her pain worsened last night, however that her fears about her increase in pain were eased by a visit from the MD this morning. Pt is independent with all bed mobility and modified independent with transfers to a RW. Pt able to amb 145' total with CGA and minimal verbal cuing for RW safety. Pt requires minimal verbal cuing and CGA for HEP. Pt ascended and descended 4 stairs with CGA and rails on both sides after demonstration and verbal cuing. At this time PT believes that pt is safe to discharge home with 24/7 supervision. PT will continue to see pt BID while admitted. D/c recommendation continues to be home with home health PT.    Follow Up Recommendations  Home health PT;Supervision/Assistance - 24 hour     Equipment Recommendations  Rolling walker with 5" wheels;3in1 (PT)    Recommendations for Other Services       Precautions / Restrictions Precautions Precautions: Knee;Fall Precaution Booklet Issued: Yes (comment) Precaution Comments: HEP packet given and reviewed. Encouraged pt to perform 2x15 per day until discontinued by home health PT Restrictions Weight Bearing Restrictions: Yes RLE Weight Bearing: Weight bearing as tolerated    Mobility  Bed Mobility Overal bed mobility: Independent             General bed mobility comments: pt performs supine>sit independently without difficulties.  Transfers Overall transfer level: Modified independent Equipment used: Rolling walker (2 wheeled)             General transfer comment: pt modified independent sit<>stand to RW. pt required minimal  verbal and tactile cuing prior to transfer for hand placement to increase safety.  Ambulation/Gait Ambulation/Gait assistance: Min guard Ambulation Distance (Feet): 145 Feet Assistive device: Rolling walker (2 wheeled) Gait Pattern/deviations: Step-through pattern;Decreased stride length Gait velocity: 10' in 30 seconds   General Gait Details: pt amb 145 feet with CGA and minimal verbal cuing for RW use and for upright posture. pt displays ocassional minimal hyperextension on R during stance phase, easily corrected with cuing to shorten step length on that side. Pt maneuvered enviornment safely.   Stairs Stairs: Yes Stairs assistance: Min guard Stair Management: Two rails Number of Stairs: 4 General stair comments: pt ascends and descends 4 stairs with rails on B sides to simulate entering and exiting her home. Pt required CGA and verbal cuing throughout task. Demonstration performed prior to task. Pt able to perform safely and states that she feels confident doing them in the home enviornment.   Wheelchair Mobility    Modified Rankin (Stroke Patients Only)       Balance                                            Cognition Arousal/Alertness: Awake/alert Behavior During Therapy: WFL for tasks assessed/performed Overall Cognitive Status: Within Functional Limits for tasks assessed                                          Exercises Total Joint Exercises Goniometric ROM: AAROM of R knee 0-91 Other Exercises Other Exercises: Pt instructed in HEP for R LE x15 reps each including: ankle pumps, quad sets, glut sets, SLR, SAQ, hip abd, and LAQ. Pt tolerated well and required only minimal cuing and CGA throughout. HEP packet given and reviewed during, encouraged pt to perform HEP 2x15 daily until discontinued by home health PT Other Exercises: Pt instructed in toileting activites including CGA and cuing for safe transfer sit<>stand on toilet. Pt  displayed increased safety awareness by waiting for assistance to stand from toilet Other Exercises: Pt instructed in gait training for 125' including fitting of personal walker to appropriate height, and verbal and tactile cuing for upright posture, decreasing stride length on R to improve hyperextension as well as safe RW use. Pt tends to amb with shoulders in a shrugged position and squeezing handles of RW tightly. Easily corrected with verbal and tactile cuing.    General Comments        Pertinent Vitals/Pain Pain Score: 7  Pain Location: R knee Pain Descriptors / Indicators: Operative site guarding;Aching Pain Intervention(s): Limited activity within patient's tolerance;Monitored during session;RN gave pain meds during session;Ice applied    Home Living                      Prior Function            PT Goals (current goals can now be found in the care plan section) Acute Rehab PT Goals Patient Stated Goal: I want to get stronger PT Goal Formulation: With patient Time For Goal Achievement: 09/12/17 Potential to Achieve Goals: Good Progress towards PT goals: Progressing toward goals    Frequency    BID      PT Plan Current plan remains appropriate    Co-evaluation              AM-PAC PT "6 Clicks" Daily Activity  Outcome Measure  Difficulty turning over in bed (including adjusting bedclothes, sheets and blankets)?: None Difficulty moving from lying on back to sitting on the side of the bed? : None Difficulty sitting down on and standing up from a chair with arms (e.g., wheelchair, bedside commode, etc,.)?: A Little Help needed moving to and from a bed to chair (including a wheelchair)?: A Little Help needed walking in hospital room?: A Little Help needed climbing 3-5 steps with a railing? : A Little 6 Click Score: 20    End of Session Equipment Utilized During Treatment: Gait belt Activity Tolerance: Patient tolerated treatment well Patient left:  in chair;with call bell/phone within reach;with chair alarm set;with SCD's reapplied Nurse Communication: Other (comment)(informed nurse pt has met PT goals and PT plans to D/c pt.) PT Visit Diagnosis: Unsteadiness on feet (R26.81);Repeated falls (R29.6);Muscle weakness (generalized) (M62.81);History of falling (Z91.81);Difficulty in walking, not elsewhere classified (R26.2)     Time: 0831-0925 PT Time Calculation (min) (ACUTE ONLY): 54 min  Charges:  $Gait Training: 23-37 mins $Therapeutic Exercise: 23-37 mins                    G Codes:       , SPT     08/30/2017, 10:26 AM   

## 2017-08-30 NOTE — Progress Notes (Signed)
   Subjective: 2 Days Post-Op Procedure(s) (LRB): COMPUTER ASSISTED TOTAL KNEE ARTHROPLASTY (Right) Patient reports pain as 8 on 0-10 scale.  Patient states that she was doing great with pain control until yesterday evening. Patient is well, and has had no acute complaints or problems Patient did extremely well with physical therapy yesterday.  Met all goals to go home except for stairs. Plan is to go Home after hospital stay. no nausea and no vomiting Patient denies any chest pains or shortness of breath. Objective: Vital signs in last 24 hours: Temp:  [97.7 F (36.5 C)-98.2 F (36.8 C)] 98.2 F (36.8 C) (06/11 2117) Pulse Rate:  [72-80] 80 (06/11 2117) Resp:  [18] 18 (06/11 1153) BP: (91-121)/(58-66) 121/66 (06/11 2117) SpO2:  [98 %-100 %] 98 % (06/11 2117) well approximated incision Heels are non tender and elevated off the bed using rolled towels Intake/Output from previous day: 06/11 0701 - 06/12 0700 In: 2156.7 [P.O.:240; I.V.:1550; IV Piggyback:366.7] Out: 270 [Drains:270] Intake/Output this shift: Total I/O In: -  Out: 140 [Drains:140]  No results for input(s): HGB in the last 72 hours. No results for input(s): WBC, RBC, HCT, PLT in the last 72 hours. No results for input(s): NA, K, CL, CO2, BUN, CREATININE, GLUCOSE, CALCIUM in the last 72 hours. No results for input(s): LABPT, INR in the last 72 hours.  EXAM General - Patient is Alert, Appropriate and Oriented Extremity - Neurologically intact Neurovascular intact Sensation intact distally Intact pulses distally Dorsiflexion/Plantar flexion intact No cellulitis present Compartment soft Dressing - scant drainage Motor Function - intact, moving foot and toes well on exam.    Past Medical History:  Diagnosis Date  . Anxiety   . Bipolar disorder ([place])   . Complication of anesthesia   . Degenerative disc disease, lumbar   . Depression   . Diabetes mellitus   . Elevated cholesterol   . Fibromyalgia   .  GERD (gastroesophageal reflux disease)   . Migraine   . Osteoarthritis   . PONV (postoperative nausea and vomiting)   . PTSD (post-traumatic stress disorder)    after death of son (accidental overdose)  . Thyroid disease     Assessment/Plan: 2 Days Post-Op Procedure(s) (LRB): COMPUTER ASSISTED TOTAL KNEE ARTHROPLASTY (Right) Active Problems:   S/P total knee arthroplasty  Estimated body mass index is 32.44 kg/m as calculated from the following:   Height as of this encounter: '5\' 6"'$  (1.676 m).   Weight as of 08/16/17: 91.2 kg (201 lb). Up with therapy Discharge home with home health  Labs: None DVT Prophylaxis - Lovenox, Foot Pumps and TED hose Weight-Bearing as tolerated to right leg Hemovac was discontinued today. Please wash operative leg, change dressing, give patient 2 extra honeycomb dressings to take home and apply TED stockings prior to being discharged.   Jillyn Ledger. [place] 08/30/2017, 6:54 AM

## 2017-08-30 NOTE — Care Management Note (Signed)
Case Management Note  Patient Details  Name: KYLIEANN EAGLES MRN: [id number] Date of Birth: 03-05-1961  Subjective/Objective:  Discharging today                  Action/Plan: [facility] notified of discharge. DME delivered  Expected Discharge Date:  08/30/17               Expected Discharge Plan:  [place]  In-House Referral:     Discharge planning Services  CM Consult  Post Acute Care Choice:  Durable Medical Equipment, Home Health Choice offered to:  Patient  DME Arranged:  Walker rolling DME Agency:  [place]:  PT [place] Agency:  [facility] ([facility]  Status of Service:  Completed, signed off  If discussed at [employer] of Stay Meetings, dates discussed:    Additional Comments:  Jolly Mango, RN 08/30/2017, 9:42 AM

## 2017-08-30 NOTE — Progress Notes (Signed)
Discharge instructions and medication details reviewed with patient. All questions answered. Patient verbalized understanding. Printed prescriptions given to patient along with AVS.  Dressing was changed. 2 extra dressings given to patient. IV removed. Patient was escorted out via wheelchair.

## 2017-08-31 DIAGNOSIS — G894 Chronic pain syndrome: Secondary | ICD-10-CM | POA: Diagnosis not present

## 2017-08-31 DIAGNOSIS — E119 Type 2 diabetes mellitus without complications: Secondary | ICD-10-CM | POA: Diagnosis not present

## 2017-08-31 DIAGNOSIS — M1712 Unilateral primary osteoarthritis, left knee: Secondary | ICD-10-CM | POA: Diagnosis not present

## 2017-08-31 DIAGNOSIS — F4310 Post-traumatic stress disorder, unspecified: Secondary | ICD-10-CM | POA: Diagnosis not present

## 2017-08-31 DIAGNOSIS — G43909 Migraine, unspecified, not intractable, without status migrainosus: Secondary | ICD-10-CM | POA: Diagnosis not present

## 2017-08-31 DIAGNOSIS — Z471 Aftercare following joint replacement surgery: Secondary | ICD-10-CM | POA: Diagnosis not present

## 2017-09-01 DIAGNOSIS — E119 Type 2 diabetes mellitus without complications: Secondary | ICD-10-CM | POA: Diagnosis not present

## 2017-09-01 DIAGNOSIS — Z471 Aftercare following joint replacement surgery: Secondary | ICD-10-CM | POA: Diagnosis not present

## 2017-09-01 DIAGNOSIS — M1712 Unilateral primary osteoarthritis, left knee: Secondary | ICD-10-CM | POA: Diagnosis not present

## 2017-09-01 DIAGNOSIS — G894 Chronic pain syndrome: Secondary | ICD-10-CM | POA: Diagnosis not present

## 2017-09-01 DIAGNOSIS — F4310 Post-traumatic stress disorder, unspecified: Secondary | ICD-10-CM | POA: Diagnosis not present

## 2017-09-01 DIAGNOSIS — G43909 Migraine, unspecified, not intractable, without status migrainosus: Secondary | ICD-10-CM | POA: Diagnosis not present

## 2017-09-02 DIAGNOSIS — F4310 Post-traumatic stress disorder, unspecified: Secondary | ICD-10-CM | POA: Diagnosis not present

## 2017-09-02 DIAGNOSIS — E119 Type 2 diabetes mellitus without complications: Secondary | ICD-10-CM | POA: Diagnosis not present

## 2017-09-02 DIAGNOSIS — M1712 Unilateral primary osteoarthritis, left knee: Secondary | ICD-10-CM | POA: Diagnosis not present

## 2017-09-02 DIAGNOSIS — G43909 Migraine, unspecified, not intractable, without status migrainosus: Secondary | ICD-10-CM | POA: Diagnosis not present

## 2017-09-02 DIAGNOSIS — G894 Chronic pain syndrome: Secondary | ICD-10-CM | POA: Diagnosis not present

## 2017-09-02 DIAGNOSIS — Z471 Aftercare following joint replacement surgery: Secondary | ICD-10-CM | POA: Diagnosis not present

## 2017-09-03 NOTE — Anesthesia Postprocedure Evaluation (Signed)
Anesthesia Post Note  Patient: [name]  Procedure(s) Performed: COMPUTER ASSISTED TOTAL KNEE ARTHROPLASTY (Right )  Anesthesia Type: Spinal     Last Vitals:  Vitals:   08/30/17 0815 08/30/17 1100  BP: 121/70 125/71  Pulse: 84 80  Resp: 18 20  Temp: 36.6 C 36.8 C  SpO2: 98% 98%    Last Pain:  Vitals:   08/30/17 1037  TempSrc:   PainSc: [place]

## 2017-09-04 ENCOUNTER — Telehealth: Payer: Self-pay

## 2017-09-04 NOTE — Telephone Encounter (Signed)
EMMI Follow-up: [name] received a automated call from my number but couldn't make it to the phone in time due to having knee surgery.  I explained about our new automated calls post discharge.  Said everything was fine right now. No needs. I let her know there would be another automated call with a different series of questions and to let us know if she has any concerns at that time.

## 2017-09-05 DIAGNOSIS — G43909 Migraine, unspecified, not intractable, without status migrainosus: Secondary | ICD-10-CM | POA: Diagnosis not present

## 2017-09-05 DIAGNOSIS — Z471 Aftercare following joint replacement surgery: Secondary | ICD-10-CM | POA: Diagnosis not present

## 2017-09-05 DIAGNOSIS — G894 Chronic pain syndrome: Secondary | ICD-10-CM | POA: Diagnosis not present

## 2017-09-05 DIAGNOSIS — E119 Type 2 diabetes mellitus without complications: Secondary | ICD-10-CM | POA: Diagnosis not present

## 2017-09-05 DIAGNOSIS — F4310 Post-traumatic stress disorder, unspecified: Secondary | ICD-10-CM | POA: Diagnosis not present

## 2017-09-05 DIAGNOSIS — M1712 Unilateral primary osteoarthritis, left knee: Secondary | ICD-10-CM | POA: Diagnosis not present

## 2017-09-06 DIAGNOSIS — M1712 Unilateral primary osteoarthritis, left knee: Secondary | ICD-10-CM | POA: Diagnosis not present

## 2017-09-06 DIAGNOSIS — Z471 Aftercare following joint replacement surgery: Secondary | ICD-10-CM | POA: Diagnosis not present

## 2017-09-06 DIAGNOSIS — G43909 Migraine, unspecified, not intractable, without status migrainosus: Secondary | ICD-10-CM | POA: Diagnosis not present

## 2017-09-06 DIAGNOSIS — E119 Type 2 diabetes mellitus without complications: Secondary | ICD-10-CM | POA: Diagnosis not present

## 2017-09-06 DIAGNOSIS — F4310 Post-traumatic stress disorder, unspecified: Secondary | ICD-10-CM | POA: Diagnosis not present

## 2017-09-06 DIAGNOSIS — G894 Chronic pain syndrome: Secondary | ICD-10-CM | POA: Diagnosis not present

## 2017-09-07 ENCOUNTER — Other Ambulatory Visit (HOSPITAL_COMMUNITY): Payer: Self-pay | Admitting: Psychiatry

## 2017-09-07 ENCOUNTER — Telehealth (HOSPITAL_COMMUNITY): Payer: Self-pay | Admitting: *Deleted

## 2017-09-07 DIAGNOSIS — Z471 Aftercare following joint replacement surgery: Secondary | ICD-10-CM | POA: Diagnosis not present

## 2017-09-07 DIAGNOSIS — G43909 Migraine, unspecified, not intractable, without status migrainosus: Secondary | ICD-10-CM | POA: Diagnosis not present

## 2017-09-07 DIAGNOSIS — G894 Chronic pain syndrome: Secondary | ICD-10-CM | POA: Diagnosis not present

## 2017-09-07 DIAGNOSIS — M1712 Unilateral primary osteoarthritis, left knee: Secondary | ICD-10-CM | POA: Diagnosis not present

## 2017-09-07 DIAGNOSIS — F4310 Post-traumatic stress disorder, unspecified: Secondary | ICD-10-CM | POA: Diagnosis not present

## 2017-09-07 DIAGNOSIS — E119 Type 2 diabetes mellitus without complications: Secondary | ICD-10-CM | POA: Diagnosis not present

## 2017-09-07 MED ORDER — CLONAZEPAM 0.5 MG PO TABS: 0.5000 mg | ORAL_TABLET | Freq: Two times a day (BID) | ORAL | 0 refills | Status: DC | PRN

## 2017-09-07 NOTE — Telephone Encounter (Signed)
ordered

## 2017-09-07 NOTE — Telephone Encounter (Signed)
Dr Modesta Messing Patient called requesting refill on Klonopin. Next appointment is July 2019

## 2017-09-08 DIAGNOSIS — F4310 Post-traumatic stress disorder, unspecified: Secondary | ICD-10-CM | POA: Diagnosis not present

## 2017-09-08 DIAGNOSIS — Z471 Aftercare following joint replacement surgery: Secondary | ICD-10-CM | POA: Diagnosis not present

## 2017-09-08 DIAGNOSIS — G43909 Migraine, unspecified, not intractable, without status migrainosus: Secondary | ICD-10-CM | POA: Diagnosis not present

## 2017-09-08 DIAGNOSIS — M1712 Unilateral primary osteoarthritis, left knee: Secondary | ICD-10-CM | POA: Diagnosis not present

## 2017-09-08 DIAGNOSIS — G894 Chronic pain syndrome: Secondary | ICD-10-CM | POA: Diagnosis not present

## 2017-09-08 DIAGNOSIS — E119 Type 2 diabetes mellitus without complications: Secondary | ICD-10-CM | POA: Diagnosis not present

## 2017-09-11 DIAGNOSIS — E119 Type 2 diabetes mellitus without complications: Secondary | ICD-10-CM | POA: Diagnosis not present

## 2017-09-11 DIAGNOSIS — G43909 Migraine, unspecified, not intractable, without status migrainosus: Secondary | ICD-10-CM | POA: Diagnosis not present

## 2017-09-11 DIAGNOSIS — F4310 Post-traumatic stress disorder, unspecified: Secondary | ICD-10-CM | POA: Diagnosis not present

## 2017-09-11 DIAGNOSIS — M1712 Unilateral primary osteoarthritis, left knee: Secondary | ICD-10-CM | POA: Diagnosis not present

## 2017-09-11 DIAGNOSIS — G894 Chronic pain syndrome: Secondary | ICD-10-CM | POA: Diagnosis not present

## 2017-09-11 DIAGNOSIS — Z471 Aftercare following joint replacement surgery: Secondary | ICD-10-CM | POA: Diagnosis not present

## 2017-09-12 DIAGNOSIS — M25461 Effusion, right knee: Secondary | ICD-10-CM | POA: Diagnosis not present

## 2017-09-12 DIAGNOSIS — M25561 Pain in right knee: Secondary | ICD-10-CM | POA: Diagnosis not present

## 2017-09-12 DIAGNOSIS — M25661 Stiffness of right knee, not elsewhere classified: Secondary | ICD-10-CM | POA: Diagnosis not present

## 2017-09-12 DIAGNOSIS — M25562 Pain in left knee: Secondary | ICD-10-CM | POA: Diagnosis not present
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
## 2017-09-14 DIAGNOSIS — M25561 Pain in right knee: Secondary | ICD-10-CM | POA: Diagnosis not present

## 2017-09-14 DIAGNOSIS — M25461 Effusion, right knee: Secondary | ICD-10-CM | POA: Diagnosis not present

## 2017-09-14 DIAGNOSIS — M25661 Stiffness of right knee, not elsewhere classified: Secondary | ICD-10-CM | POA: Diagnosis not present

## 2017-09-14 DIAGNOSIS — M25562 Pain in left knee: Secondary | ICD-10-CM | POA: Diagnosis not present

## 2017-09-15 DIAGNOSIS — M25661 Stiffness of right knee, not elsewhere classified: Secondary | ICD-10-CM | POA: Diagnosis not present

## 2017-09-15 DIAGNOSIS — M25461 Effusion, right knee: Secondary | ICD-10-CM | POA: Diagnosis not present

## 2017-09-15 DIAGNOSIS — M25561 Pain in right knee: Secondary | ICD-10-CM | POA: Diagnosis not present

## 2017-09-15 DIAGNOSIS — M25562 Pain in left knee: Secondary | ICD-10-CM | POA: Diagnosis not present

## 2017-09-18 ENCOUNTER — Ambulatory Visit (INDEPENDENT_AMBULATORY_CARE_PROVIDER_SITE_OTHER): Payer: Medicare HMO | Admitting: Psychiatry

## 2017-09-18 VITALS — BP 104/70 | HR 87 | Ht 66.0 in | Wt 197.0 lb

## 2017-09-18 DIAGNOSIS — F0630 Mood disorder due to known physiological condition, unspecified: Secondary | ICD-10-CM | POA: Diagnosis not present

## 2017-09-18 DIAGNOSIS — Z818 Family history of other mental and behavioral disorders: Secondary | ICD-10-CM

## 2017-09-18 DIAGNOSIS — F17210 Nicotine dependence, cigarettes, uncomplicated: Secondary | ICD-10-CM | POA: Diagnosis not present

## 2017-09-18 DIAGNOSIS — F4310 Post-traumatic stress disorder, unspecified: Secondary | ICD-10-CM

## 2017-09-18 DIAGNOSIS — Z811 Family history of alcohol abuse and dependence: Secondary | ICD-10-CM | POA: Diagnosis not present

## 2017-09-18 MED ORDER — BUPROPION HCL ER (SR) 100 MG PO TB12: 100.0000 mg | ORAL_TABLET | Freq: Every day | ORAL | 0 refills | Status: DC

## 2017-09-18 MED ORDER — ESCITALOPRAM OXALATE 10 MG PO TABS: ORAL_TABLET | ORAL | 1 refills | Status: DC

## 2017-09-18 MED ORDER — AMPHETAMINE-DEXTROAMPHET ER 15 MG PO CP24: 15.0000 mg | ORAL_CAPSULE | ORAL | 0 refills | Status: DC

## 2017-09-18 MED ORDER — QUETIAPINE FUMARATE 200 MG PO TABS: 200.0000 mg | ORAL_TABLET | Freq: Every day | ORAL | 0 refills | Status: DC

## 2017-09-18 MED ORDER — CLONAZEPAM 0.5 MG PO TABS: 0.5000 mg | ORAL_TABLET | Freq: Three times a day (TID) | ORAL | 0 refills | Status: DC | PRN

## 2017-09-18 NOTE — Patient Instructions (Signed)
1. Continue Wellbutrin 100 mg twice a day 2. Continue Quetiapine 200 mg at night  3. Start lexapro 5 mg daily for one week, then 10 mg daily 4. Continue Adderall XR 15 mg daily  5.Continue clonazepam 0.5 mg twice a day for anxiety 6.Return to clinic in one month for 30 mins

## 2017-09-19 ENCOUNTER — Telehealth (HOSPITAL_COMMUNITY): Payer: Self-pay | Admitting: *Deleted

## 2017-09-19 NOTE — Telephone Encounter (Signed)
[facility] : BRAND NAME PREFERRED ADDERALL XR

## 2017-09-19 NOTE — Telephone Encounter (Signed)
I would like her to take it once a day, NOT twice a day.

## 2017-09-19 NOTE — Telephone Encounter (Signed)
Dr Modesta Messing okay per Rx insurance specialist script needs to read: amphetamine-dextroamphetamine (ADDERALL XR) 15 MG  24 hr capsule needs to be removed & for it to be long acting prescribed it  2 x daily instead of 1 x daily.  So that [employer] will cover it.

## 2017-09-19 NOTE — Telephone Encounter (Signed)
Dr Modesta Messing E-Script for amphetamine-dextroamphetamine 15 MG 24 hr capsule has to be re-scribed. 24 hr capsule has to be written : amphetamine-dextroamphetamine ER or XR for the 24 hr capsule

## 2017-09-19 NOTE — Telephone Encounter (Signed)
[facility] has stated that the BRAND NAME IS PREFERRED. Informed Rx

## 2017-09-20 DIAGNOSIS — M25661 Stiffness of right knee, not elsewhere classified: Secondary | ICD-10-CM | POA: Diagnosis not present

## 2017-09-20 DIAGNOSIS — M25562 Pain in left knee: Secondary | ICD-10-CM | POA: Diagnosis not present

## 2017-09-20 DIAGNOSIS — M25561 Pain in right knee: Secondary | ICD-10-CM | POA: Diagnosis not present

## 2017-09-20 DIAGNOSIS — M25461 Effusion, right knee: Secondary | ICD-10-CM | POA: Diagnosis not present

## 2017-09-26 ENCOUNTER — Telehealth (HOSPITAL_COMMUNITY): Payer: Self-pay | Admitting: *Deleted

## 2017-09-26 DIAGNOSIS — M25562 Pain in left knee: Secondary | ICD-10-CM | POA: Diagnosis not present

## 2017-09-26 DIAGNOSIS — M25561 Pain in right knee: Secondary | ICD-10-CM | POA: Diagnosis not present

## 2017-09-26 DIAGNOSIS — M25461 Effusion, right knee: Secondary | ICD-10-CM | POA: Diagnosis not present

## 2017-09-26 DIAGNOSIS — M25661 Stiffness of right knee, not elsewhere classified: Secondary | ICD-10-CM | POA: Diagnosis not present

## 2017-09-26 NOTE — Telephone Encounter (Signed)
Dr Modesta Messing Patient called stating she received a denial letter from her insurance for Adderall. She states that it was denied due to  PTSD code instead of it stating ADHD. She would like to know if prescribing another medication would help. Or can you do appeal to change diagnosis code

## 2017-09-26 NOTE — Telephone Encounter (Signed)
Noted  

## 2017-09-26 NOTE — Telephone Encounter (Signed)
I believe that we sent that form with diagnosis of ADHD. Ask them if it is in the process.

## 2017-09-26 NOTE — Telephone Encounter (Signed)
Dr Modesta Messing Called LVM for patient informing that the Authorization is under appeal. Waiting on decision. I called [employer] to inform that there where some pre-secleted boxes checked & some thing's pre-typed that didn't apply. The provider wrote in her diagnosis code & X the box ADHD. Note was made but sine Appeal was already in process awaiting decision

## 2017-09-27 ENCOUNTER — Telehealth (HOSPITAL_COMMUNITY): Payer: Self-pay | Admitting: *Deleted

## 2017-09-27 NOTE — Telephone Encounter (Signed)
[employer]  APPROVED DEXTROAMPHETAMINE-AMPHET ER EFFECTIVE: 09/26/2017  -----   03/20/2018

## 2017-09-28 DIAGNOSIS — M25461 Effusion, right knee: Secondary | ICD-10-CM | POA: Diagnosis not present

## 2017-09-28 DIAGNOSIS — M25661 Stiffness of right knee, not elsewhere classified: Secondary | ICD-10-CM | POA: Diagnosis not present

## 2017-09-28 DIAGNOSIS — M25561 Pain in right knee: Secondary | ICD-10-CM | POA: Diagnosis not present

## 2017-09-28 DIAGNOSIS — M25562 Pain in left knee: Secondary | ICD-10-CM | POA: Diagnosis not present

## 2017-09-29 DIAGNOSIS — M25561 Pain in right knee: Secondary | ICD-10-CM | POA: Diagnosis not present

## 2017-09-29 DIAGNOSIS — M25661 Stiffness of right knee, not elsewhere classified: Secondary | ICD-10-CM | POA: Diagnosis not present

## 2017-09-29 DIAGNOSIS — M25562 Pain in left knee: Secondary | ICD-10-CM | POA: Diagnosis not present

## 2017-09-29 DIAGNOSIS — M25461 Effusion, right knee: Secondary | ICD-10-CM | POA: Diagnosis not present

## 2017-10-04 DIAGNOSIS — M25661 Stiffness of right knee, not elsewhere classified: Secondary | ICD-10-CM | POA: Diagnosis not present

## 2017-10-04 DIAGNOSIS — M25562 Pain in left knee: Secondary | ICD-10-CM | POA: Diagnosis not present

## 2017-10-04 DIAGNOSIS — M25561 Pain in right knee: Secondary | ICD-10-CM | POA: Diagnosis not present

## 2017-10-04 DIAGNOSIS — M25461 Effusion, right knee: Secondary | ICD-10-CM | POA: Diagnosis not present

## 2017-10-06 DIAGNOSIS — M25562 Pain in left knee: Secondary | ICD-10-CM | POA: Diagnosis not present

## 2017-10-06 DIAGNOSIS — M25461 Effusion, right knee: Secondary | ICD-10-CM | POA: Diagnosis not present

## 2017-10-06 DIAGNOSIS — M25661 Stiffness of right knee, not elsewhere classified: Secondary | ICD-10-CM | POA: Diagnosis not present

## 2017-10-06 DIAGNOSIS — M25561 Pain in right knee: Secondary | ICD-10-CM | POA: Diagnosis not present

## 2017-10-10 ENCOUNTER — Emergency Department (HOSPITAL_COMMUNITY): Payer: Medicare HMO

## 2017-10-10 ENCOUNTER — Encounter (HOSPITAL_COMMUNITY): Payer: Self-pay | Admitting: *Deleted

## 2017-10-10 ENCOUNTER — Other Ambulatory Visit: Payer: Self-pay

## 2017-10-10 ENCOUNTER — Emergency Department (HOSPITAL_COMMUNITY)
Admission: EM | Admit: 2017-10-10 | Discharge: 2017-10-10 | Disposition: A | Discharge status: Home / Self Care | Payer: Medicare HMO | Attending: Emergency Medicine | Admitting: Emergency Medicine

## 2017-10-10 DIAGNOSIS — R2241 Localized swelling, mass and lump, right lower limb: Secondary | ICD-10-CM | POA: Insufficient documentation

## 2017-10-10 DIAGNOSIS — M25661 Stiffness of right knee, not elsewhere classified: Secondary | ICD-10-CM | POA: Diagnosis not present

## 2017-10-10 DIAGNOSIS — Z471 Aftercare following joint replacement surgery: Secondary | ICD-10-CM | POA: Diagnosis not present

## 2017-10-10 DIAGNOSIS — M7989 Other specified soft tissue disorders: Secondary | ICD-10-CM

## 2017-10-10 DIAGNOSIS — Z9049 Acquired absence of other specified parts of digestive tract: Secondary | ICD-10-CM | POA: Insufficient documentation

## 2017-10-10 DIAGNOSIS — F17210 Nicotine dependence, cigarettes, uncomplicated: Secondary | ICD-10-CM | POA: Diagnosis not present

## 2017-10-10 DIAGNOSIS — M25561 Pain in right knee: Secondary | ICD-10-CM | POA: Diagnosis not present

## 2017-10-10 DIAGNOSIS — M25562 Pain in left knee: Secondary | ICD-10-CM | POA: Diagnosis not present

## 2017-10-10 DIAGNOSIS — Z96651 Presence of right artificial knee joint: Secondary | ICD-10-CM | POA: Diagnosis not present

## 2017-10-10 DIAGNOSIS — Z79899 Other long term (current) drug therapy: Secondary | ICD-10-CM | POA: Diagnosis not present

## 2017-10-10 DIAGNOSIS — E119 Type 2 diabetes mellitus without complications: Secondary | ICD-10-CM | POA: Diagnosis not present

## 2017-10-10 DIAGNOSIS — M25461 Effusion, right knee: Secondary | ICD-10-CM | POA: Diagnosis not present

## 2017-10-10 DIAGNOSIS — R0602 Shortness of breath: Secondary | ICD-10-CM | POA: Insufficient documentation

## 2017-10-10 DIAGNOSIS — Z7984 Long term (current) use of oral hypoglycemic drugs: Secondary | ICD-10-CM | POA: Insufficient documentation

## 2017-10-10 DIAGNOSIS — F419 Anxiety disorder, unspecified: Secondary | ICD-10-CM | POA: Insufficient documentation

## 2017-10-10 DIAGNOSIS — F319 Bipolar disorder, unspecified: Secondary | ICD-10-CM | POA: Diagnosis not present

## 2017-10-10 DIAGNOSIS — M79604 Pain in right leg: Secondary | ICD-10-CM | POA: Diagnosis not present

## 2017-10-10 DIAGNOSIS — R600 Localized edema: Secondary | ICD-10-CM | POA: Diagnosis not present

## 2017-10-10 LAB — CBC WITH DIFFERENTIAL/PLATELET
Basophils Absolute: 0 10*3/uL (ref 0.0–0.1)
Basophils Relative: 0 %
Eosinophils Absolute: 0.2 10*3/uL (ref 0.0–0.7)
Eosinophils Relative: 3 %
HCT: 43.8 % (ref 36.0–46.0)
Hemoglobin: 14.7 g/dL (ref 12.0–15.0)
Lymphocytes Relative: 47 %
Lymphs Abs: 3.7 10*3/uL (ref 0.7–4.0)
MCH: 31.8 pg (ref 26.0–34.0)
MCHC: 33.6 g/dL (ref 30.0–36.0)
MCV: 94.8 fL (ref 78.0–100.0)
Monocytes Absolute: 0.6 10*3/uL (ref 0.1–1.0)
Monocytes Relative: 7 %
Neutro Abs: 3.5 10*3/uL (ref 1.7–7.7)
Neutrophils Relative %: 43 %
Platelets: 283 10*3/uL (ref 150–400)
RBC: 4.62 MIL/uL (ref 3.87–5.11)
RDW: 13.2 % (ref 11.5–15.5)
WBC: 8 10*3/uL (ref 4.0–10.5)

## 2017-10-10 LAB — D-DIMER, QUANTITATIVE: D-Dimer, Quant: 1.72 ug/mL-FEU — ABNORMAL HIGH (ref 0.00–0.50)

## 2017-10-10 LAB — BASIC METABOLIC PANEL
Anion gap: 9 (ref 5–15)
BUN: 17 mg/dL (ref 6–20)
CO2: 27 mmol/L (ref 22–32)
Calcium: 9.2 mg/dL (ref 8.9–10.3)
Chloride: 103 mmol/L (ref 98–111)
Creatinine, Ser: 0.77 mg/dL (ref 0.44–1.00)
GFR calc Af Amer: >60 mL/min (ref >60)
GFR calc non Af Amer: >60 mL/min (ref >60)
Glucose, Bld: 117 mg/dL — ABNORMAL HIGH (ref 70–99)
Potassium: 4.2 mmol/L (ref 3.5–5.1)
Sodium: 139 mmol/L (ref 135–145)

## 2017-10-10 MED ORDER — IOPAMIDOL (ISOVUE-370) INJECTION 76%
100.0000 mL | Freq: Once | INTRAVENOUS | Status: AC | PRN
  Administered 2017-10-10: 100 mL via INTRAVENOUS

## 2017-10-10 MED ORDER — MORPHINE SULFATE (PF) 4 MG/ML IV SOLN
4.0000 mg | Freq: Once | INTRAVENOUS | Status: AC
  Administered 2017-10-10: 4 mg via INTRAVENOUS
  Filled 2017-10-10: qty 1

## 2017-10-10 NOTE — ED Notes (Signed)
Patient transported to CT 
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
## 2017-10-10 NOTE — Discharge Instructions (Signed)
Your work-up today was negative for a blood clot in the legs or the chest.  Continue taking home medicines as prescribed.  Apply ice or heat for comfort, 20 minutes on 20 minutes off.  Continue with physical therapy.  Go to your orthopedist in 2 days as scheduled for reevaluation.  Return to the emergency department if any concerning signs or symptoms develop such as worsening pain, swelling, redness, or weakness.

## 2017-10-10 NOTE — ED Provider Notes (Signed)
[facility] EMERGENCY DEPARTMENT Provider Note   CSN: [id number] Arrival date & time: 10/10/17  1046     History   Chief Complaint Chief Complaint  Patient presents with  . Leg Pain    HPI [name] is a 57 y.o. female with history of anxiety, filler disorder, degenerative disc disease, diabetes mellitus, fiber myalgia, GERD, migraines, osteoarthritis, PTSD, tobacco abuse, and hyperlipidemia presents for evaluation of acute onset, progressively worsening right lower extremity swelling for 3 days.  She states that she took a long car ride for 4 hours straight without taking a break and noticed swelling and pain in the right lower extremity towards the end of that car drive.  Pain is constant and throbbing, at times sharp, begins around the right hip and radiates all the way down the lower extremity.  She is status post right knee replacement on 08/28/2017.  Pain worsens with ambulation and palpation.  It is worse in the popliteal fossa and calf area.  Endorses numbness and tingling of her toes of the right foot intermittently.  She was seen and evaluated by her physical therapist who was concerned for DVT and sent her to the ED for further evaluation.  Denies prior history of DVT or PE.  She does note mild shortness of breath and dyspnea on exertion since yesterday but states "I think it is because of the weather ".  She denies chest pains, no hemoptysis.  The history is provided by the patient.    Past Medical History:  Diagnosis Date  . Anxiety   . Bipolar disorder ([place])   . Complication of anesthesia   . Degenerative disc disease, lumbar   . Depression   . Diabetes mellitus   . Elevated cholesterol   . Fibromyalgia   . GERD (gastroesophageal reflux disease)   . Migraine   . Osteoarthritis   . PONV (postoperative nausea and vomiting)   . PTSD (post-traumatic stress disorder)    after death of son (accidental overdose)  . Thyroid disease     Patient Active Problem List     Diagnosis Date Noted  . S/P total knee arthroplasty 08/28/2017  . 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 Palpitations 06/01/2016  . Hyperlipidemia 06/01/2016  . Tobacco abuse 06/01/2016  . Dyspnea on exertion 05/16/2016  . Left leg swelling 05/16/2016  . Diabetes mellitus ([place]) 05/16/2016  . Chest pain   . Swelling   . Acute medial meniscus tear of left knee   . Post traumatic stress disorder (PTSD) 11/08/2014  . MDD (major depressive disorder), recurrent severe, without psychosis ([place]) 11/08/2014  . 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 Panic disorder 11/08/2014  . Chronic pain syndrome 11/21/2013  . Primary osteoarthritis of both knees 11/21/2013  . Spinal stenosis of lumbar region 11/21/2013  . Difficulty in walking(719.7) 10/14/2013  . Sciatica associated with disorder of lumbar spine 09/17/2013  . Lower back pain 08/08/2013  . Lateral meniscus, posterior horn derangement 06/13/2013  . Arthritis of right knee 06/13/2013  . S/P medial meniscectomy of right knee 06/13/2013  . Left shoulder pain 08/28/2012  . Rotator cuff syndrome of left shoulder 08/28/2012  . Rotator cuff tear 08/28/2012  . Pes anserinus bursitis 05/16/2012  . Encounter for screening colonoscopy 04/16/2012  . Fatty liver 04/16/2012  . Back pain 07/26/2011  . Knee bursitis, left 07/26/2011  . OA (osteoarthritis) of knee 07/26/2011  . Spinal stenosis 10/28/2010  . Medial  meniscus, posterior horn derangement 08/12/2010  . Knee pain 08/04/2010  . Sciatica 08/04/2010  . H N P-LUMBAR 01/14/2010  . 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 LUMBOSACRAL STRAIN 05/12/2009  . DERANGEMENT OF POSTERIOR HORN OF MEDIAL MENISCUS 01/19/2009  . BACK PAIN 01/19/2009  . LOWER LEG, ARTHRITIS, DEGEN./OSTEO 12/02/2008  . JOINT EFFUSION, KNEE 11/10/2008  . ANSERINE BURSITIS, RIGHT 11/10/2008  . DERANGEMENT MENISCUS  10/15/2008  . KNEE PAIN 10/15/2008    Past Surgical History:  Procedure Laterality Date  . CARPAL TUNNEL RELEASE  bilateral  . CHOLECYSTECTOMY    . COLONOSCOPY WITH PROPOFOL N/A 05/01/2012   JKD:TOIZT COLON polyps/Mild diverticulosis was noted in the sigmoid colon/Moderate sized internal hemorrhoids. path with tubular adenoma  . gallbladder    . KNEE ARTHROPLASTY Right 08/28/2017   Procedure: COMPUTER ASSISTED TOTAL KNEE ARTHROPLASTY;  Surgeon: Dereck Leep, MD;  Location: [facility] ORS;  Service: Orthopedics;  Laterality: Right;  . KNEE ARTHROSCOPY     bilateral  . KNEE ARTHROSCOPY WITH MEDIAL MENISECTOMY Right 10/29/2015   Procedure: ARTHROSCOPY RIGHT KNEE  WITH PARTIAL MEDIAL MENISECTOMY;  Surgeon: Carole Civil, MD;  Location: [facility] ORS;  Service: Orthopedics;  Laterality: Right;  . POLYPECTOMY N/A 05/01/2012   Procedure: POLYPECTOMY;  Surgeon: Danie Binder, MD;  Location: [facility] ORS;  Service: Endoscopy;  Laterality: N/A;  . SHOULDER SURGERY  left  . VAGINAL HYSTERECTOMY       OB History   None      Home Medications    Prior to Admission medications   Medication Sig Start Date End Date Taking? Authorizing Provider  amphetamine-dextroamphetamine (ADDERALL XR) 15 MG 24 hr capsule Take 1 capsule by mouth every morning. 09/18/17 10/18/17 Yes Hisada, Elie Goody, MD  buPROPion (WELLBUTRIN SR) 100 MG 12 hr tablet Take 1 tablet (100 mg total) by mouth daily. 09/18/17  Yes Hisada, Elie Goody, MD  canagliflozin (INVOKANA) 300 MG TABS tablet Take 300 mg by mouth daily. 11/10/14  Yes Niel Hummer, NP  clonazePAM (KLONOPIN) 0.5 MG tablet Take 1 tablet (0.5 mg total) by mouth 3 (three) times daily as needed for anxiety. 09/18/17  Yes Hisada, Elie Goody, MD  escitalopram (LEXAPRO) 10 MG tablet 5 mg daily for one week, then 10 mg daily 09/18/17  Yes Hisada, Reina, MD  gabapentin (NEURONTIN) 800 MG tablet Take 1,600 mg by mouth 2 (two) times daily.    Yes [provider]  glipiZIDE (GLUCOTROL XL) 5 MG 24 hr  tablet Take 5 mg by mouth daily with breakfast.   Yes [provider]  ibuprofen (IBU) 800 MG tablet Take by mouth. 07/06/17  Yes [provider]  levothyroxine (SYNTHROID, LEVOTHROID) 150 MCG tablet Take 150 mcg by mouth daily before breakfast.   Yes [provider]  Menthol, Topical Analgesic, (BIOFREEZE EX) Apply 1 application topically 3 (three) times daily as needed (for pain).   Yes [provider]  metFORMIN (GLUCOPHAGE-XR) 500 MG 24 hr tablet Take 1,000 mg by mouth 2 (two) times daily. 05/23/17  Yes [provider]  methocarbamol (ROBAXIN) 500 MG tablet Take 1 tablet (500 mg total) by mouth 4 (four) times daily. 10/07/15  Yes Carole Civil, MD  Methylnaltrexone Bromide (RELISTOR) 150 MG TABS Take 150 mg by mouth daily.   Yes [provider]  Oxycodone HCl 10 MG TABS Take 10 mg by mouth 3 (three) times daily as needed (for pain).    Yes [provider]  pantoprazole (PROTONIX) 40 MG tablet Take 40 mg by  mouth daily before breakfast. 06/29/17  Yes [provider]  pioglitazone (ACTOS) 15 MG tablet Take 15 mg by mouth daily. 07/20/17  Yes [provider]  QUEtiapine (SEROQUEL) 200 MG tablet Take 1 tablet (200 mg total) by mouth at bedtime. 09/18/17 12/17/17 Yes Hisada, Elie Goody, MD  simvastatin (ZOCOR) 40 MG tablet Take 1 tablet (40 mg total) by mouth daily. Patient taking differently: Take 40 mg by mouth at bedtime.  11/10/14  Yes Niel Hummer, NP  Dulaglutide (TRULICITY) 1.5 SA/6.3KZ SOPN Inject 0.5 mLs into the skin every Saturday.     [provider]  SUMAtriptan (IMITREX) 100 MG tablet One tablet po prn migraine.  May repeat in 2 hrs if needed.  Do not exceed 200 mg per 24 hrs. 06/26/12   Triplett, Tammy, PA-C  traMADol (ULTRAM) 50 MG tablet Take 1-2 tablets (50-100 mg total) by mouth every 4 (four) hours as needed for moderate pain. 08/29/17   Watt Climes, PA    Family History Family History  Problem  Relation Age of Onset  . Cancer Unknown   . Diabetes Unknown   . Heart disease Unknown   . Arthritis Unknown   . Stroke Mother   . Anxiety disorder Mother   . Heart failure Mother   . Depression Mother   . Cancer - Lung Father   . Lung disease Father   . Heart failure Sister   . Depression Sister   . Alcohol abuse Brother   . Lung disease Unknown   . Heart disease Maternal Grandfather   . Stroke Paternal Grandmother   . Drug abuse Other   . Drug abuse Son   . Drug abuse Son   . Colon cancer Neg Hx     Social History Social History   Tobacco Use  . Smoking status: Current Every Day Smoker    Packs/day: 0.50    Years: 13.00    Pack years: 6.50    Types: Cigarettes  . Smokeless tobacco: Never Used  Substance Use Topics  . Alcohol use: No  . Drug use: No     Allergies   Patient has no known allergies.   Review of Systems Review of Systems  Constitutional: Negative for chills and fever.  Respiratory: Positive for shortness of breath. Negative for cough.   Cardiovascular: Positive for leg swelling.  Musculoskeletal: Positive for arthralgias and myalgias.  Neurological: Positive for numbness. Negative for syncope and weakness.  All other systems reviewed and are negative.    Physical Exam Updated Vital Signs BP 115/65   Pulse 81   Temp 98 F (36.7 C) (Oral)   Resp (!) 28   Ht 5\' 6"  (1.676 m)   Wt 89.4 kg (197 lb)   SpO2 97%   BMI 31.80 kg/m   Physical Exam  Constitutional: She appears well-developed and well-nourished. No distress.  HENT:  Head: Normocephalic and atraumatic.  Eyes: Conjunctivae are normal. Right eye exhibits no discharge. Left eye exhibits no discharge.  Neck: No JVD present. No tracheal deviation present.  Cardiovascular: Normal rate, regular rhythm, normal heart sounds and intact distal pulses.  2+ DP/PT pulses bilaterally.  Right lower extremity appears swollen as compared to the left.  Compartments are soft.  Homans sign present  on the right.  No pitting edema of the lower extremities.  Pulmonary/Chest: Effort normal and breath sounds normal. No stridor. She has no wheezes.  Abdominal: Soft. Bowel sounds are normal. She exhibits no distension.  Musculoskeletal: She exhibits  no edema.  Well-healed to the anterior right knee.  There is diffuse generalized tenderness to palpation of the right lower extremity with no ecchymosis, crepitus, deformity, or erythema noted.  5/5 strength of BLE major muscle groups.  Pain elicited with flexion of the right knee and hip.  Neurological: She is alert.  Fluent speech with no evidence of dysarthria or aphasia, no facial droop, sensation intact to soft touch of bilateral lower extremities.  Skin: Skin is warm and dry. No erythema.  Psychiatric: She has a normal mood and affect. Her behavior is normal.  Nursing note and vitals reviewed.    ED Treatments / Results  Labs (all labs ordered are listed, but only abnormal results are displayed) Labs Reviewed  D-DIMER, QUANTITATIVE (NOT AT [facility]) - Abnormal; Notable for the following components:      Result Value   D-Dimer, Quant 1.72 (*)    All other components within normal limits  BASIC METABOLIC PANEL - Abnormal; Notable for the following components:   Glucose, Bld 117 (*)    All other components within normal limits  CBC WITH DIFFERENTIAL/PLATELET    EKG EKG Interpretation  Date/Time:  Tuesday October 10 2017 11:43:37 EDT Ventricular Rate:  81 PR Interval:    QRS Duration: 96 QT Interval:  386 QTC Calculation: 448 R Axis:   7 Text Interpretation:  Sinus rhythm Low voltage, precordial leads Baseline wander in lead(s) V3 No significant change since last tracing Confirmed by Merrily Pew [phone]) on 10/10/2017 2:35:02 PM   Radiology Dg Chest 2 View  Result Date: 10/10/2017 CLINICAL DATA:  Short of breath EXAM: CHEST - 2 VIEW COMPARISON:  10/27/2016 FINDINGS: The heart size and mediastinal contours are within normal limits.  Both lungs are clear. The visualized skeletal structures are unremarkable. IMPRESSION: No active cardiopulmonary disease. Electronically Signed   By: Franchot Gallo M.D.   On: 10/10/2017 12:33   Ct Angio Chest Pe W And/or Wo Contrast  Result Date: 10/10/2017 CLINICAL DATA:  Suspect pulmonary embolism. Right knee replacement 08/28/2017 EXAM: CT ANGIOGRAPHY CHEST WITH CONTRAST TECHNIQUE: Multidetector CT imaging of the chest was performed using the standard protocol during bolus administration of intravenous contrast. Multiplanar CT image reconstructions and MIPs were obtained to evaluate the vascular anatomy. CONTRAST:  140mL ISOVUE-370 IOPAMIDOL (ISOVUE-370) INJECTION 76% COMPARISON:  10/10/2017, chest CT 10/27/2016 FINDINGS: Cardiovascular: Negative for pulmonary embolism. Minimal atherosclerotic disease in the thoracic aorta otherwise negative. Heart size normal. No pericardial effusion. No significant coronary calcification. Mediastinum/Nodes: Negative for mass or adenopathy Lungs/Pleura: Lungs are with well aerated and clear. No infiltrate effusion or mass. Upper Abdomen: Mild hepatomegaly.  No acute abnormality. Musculoskeletal: Mild degenerative changes in the thoracic spine. No acute skeletal abnormality. Review of the MIP images confirms the above findings. IMPRESSION: 1. Negative for pulmonary embolism. No acute abnormality in the chest 2. Mild hepatomegaly Electronically Signed   By: Franchot Gallo M.D.   On: 10/10/2017 15:00   US Venous Img Lower Right (dvt Study)  Result Date: 10/10/2017 CLINICAL DATA:  Right lower extremity edema with pain. Long car trip. Total knee arthroplasty June of 2019. EXAM: RIGHT LOWER EXTREMITY VENOUS DUPLEX ULTRASOUND TECHNIQUE: Doppler venous assessment of the right lower extremity deep venous system was performed, including characterization of spectral flow, compressibility, and phasicity. COMPARISON:  None. FINDINGS: There is complete compressibility of the right  common femoral, femoral, and popliteal veins. Doppler analysis demonstrates respiratory phasicity and augmentation of flow with calf compression. No obvious superficial vein or calf vein  thrombosis. IMPRESSION: No evidence of right lower extremity DVT. Electronically Signed   By: Marybelle Killings M.D.   On: 10/10/2017 12:39    Procedures Procedures (including critical care time)  Medications Ordered in ED Medications  morphine 4 MG/ML injection 4 mg (4 mg Intravenous Given 10/10/17 1154)  iopamidol (ISOVUE-370) 76 % injection 100 mL (100 mLs Intravenous Contrast Given 10/10/17 1438)     Initial Impression / Assessment and Plan / ED Course  I have reviewed the triage vital signs and the nursing notes.  Pertinent labs & imaging results that were available during my care of the patient were reviewed by me and considered in my medical decision making (see chart for details).    Patient presents for evaluation of pain and swelling of the right lower extremity for the past 3 days.  She is afebrile, vital signs are stable.  She is nontoxic in appearance.  She is neurovascularly intact.  She is ambulate her without difficulty despite pain.  No known injury to the lower extremity but she did have a long car ride and her physical therapist sent her over here to rule out DVT.  She does also note some mild shortness of breath which does not sound pleuritic in nature but will obtain d-dimer to rule out PE.  No signs of secondary skin infection.  Lab work reviewed by me shows no leukocytosis, no anemia, no metabolic derangements.  D-dimer is elevated although this appears to be chronic.  DVT study negative for DVT or Baker's cyst.  Chest x-ray shows no acute cardiopulmonary abnormalities.  CTA shows no evidence of PE or acute abnormality in the chest.  EKG shows normal sinus rhythm, no significant changes from last tracing, no ischemic changes noted.  She denies chest pain I doubt ACS/MI.  On reevaluation the patient  is resting comfortably no apparent distress.  No concern for pneumonia, pleural effusion, pericarditis, myocarditis, PE, DVT, dissection.  No further emergent work-up required at this time.  She is stable for discharge home with follow-up with her orthopedist whom she is scheduled to see in 2 days.  Discussed strict ED return precautions. Pt verbalized understanding of and agreement with plan and is safe for discharge home at this time.  Final Clinical Impressions(s) / ED Diagnoses   Final diagnoses:  Pain and swelling of right lower extremity    ED Discharge Orders    None       Debroah Baller 10/10/17 1656    Mesner, Corene Cornea, MD 10/13/17 2338

## 2017-10-10 NOTE — ED Triage Notes (Signed)
Knee replacement on June 10

## 2017-10-10 NOTE — ED Notes (Signed)
Pt transported to xray and US

## 2017-10-10 NOTE — ED Notes (Signed)
Pt given diet ginger ale with EDP approval.
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
## 2017-10-12 DIAGNOSIS — Z96651 Presence of right artificial knee joint: Secondary | ICD-10-CM | POA: Diagnosis not present

## 2017-10-13 DIAGNOSIS — M25561 Pain in right knee: Secondary | ICD-10-CM | POA: Diagnosis not present

## 2017-10-13 DIAGNOSIS — M25461 Effusion, right knee: Secondary | ICD-10-CM | POA: Diagnosis not present

## 2017-10-13 DIAGNOSIS — M25562 Pain in left knee: Secondary | ICD-10-CM | POA: Diagnosis not present

## 2017-10-13 DIAGNOSIS — M25661 Stiffness of right knee, not elsewhere classified: Secondary | ICD-10-CM | POA: Diagnosis not present

## 2017-10-17 DIAGNOSIS — M25562 Pain in left knee: Secondary | ICD-10-CM | POA: Diagnosis not present

## 2017-10-17 DIAGNOSIS — M25661 Stiffness of right knee, not elsewhere classified: Secondary | ICD-10-CM | POA: Diagnosis not present

## 2017-10-17 DIAGNOSIS — M25461 Effusion, right knee: Secondary | ICD-10-CM | POA: Diagnosis not present

## 2017-10-17 DIAGNOSIS — M25561 Pain in right knee: Secondary | ICD-10-CM | POA: Diagnosis not present

## 2017-10-19 ENCOUNTER — Ambulatory Visit (INDEPENDENT_AMBULATORY_CARE_PROVIDER_SITE_OTHER): Payer: Medicare HMO | Admitting: Psychiatry

## 2017-10-19 ENCOUNTER — Encounter (HOSPITAL_COMMUNITY): Payer: Self-pay | Admitting: Psychiatry

## 2017-10-19 VITALS — BP 104/67 | HR 85 | Ht 66.0 in | Wt 202.0 lb

## 2017-10-19 DIAGNOSIS — Z79899 Other long term (current) drug therapy: Secondary | ICD-10-CM

## 2017-10-19 DIAGNOSIS — F4310 Post-traumatic stress disorder, unspecified: Secondary | ICD-10-CM | POA: Diagnosis not present

## 2017-10-19 DIAGNOSIS — M25461 Effusion, right knee: Secondary | ICD-10-CM | POA: Diagnosis not present

## 2017-10-19 DIAGNOSIS — F0630 Mood disorder due to known physiological condition, unspecified: Secondary | ICD-10-CM

## 2017-10-19 DIAGNOSIS — M25661 Stiffness of right knee, not elsewhere classified: Secondary | ICD-10-CM | POA: Diagnosis not present

## 2017-10-19 DIAGNOSIS — M25562 Pain in left knee: Secondary | ICD-10-CM | POA: Diagnosis not present

## 2017-10-19 DIAGNOSIS — M25561 Pain in right knee: Secondary | ICD-10-CM | POA: Diagnosis not present

## 2017-10-19 MED ORDER — CLONAZEPAM 0.5 MG PO TABS: 0.5000 mg | ORAL_TABLET | Freq: Three times a day (TID) | ORAL | 1 refills | Status: DC | PRN

## 2017-10-19 MED ORDER — ESCITALOPRAM OXALATE 10 MG PO TABS: 10.0000 mg | ORAL_TABLET | Freq: Every day | ORAL | 0 refills | Status: DC

## 2017-10-19 NOTE — Patient Instructions (Signed)
1. Continue Wellbutrin 100 mg twice a day 2. Continue Quetiapine 200 mg at night  3. Continue lexapro 10 mg daily 4. Hold Adderall  5. Continue clonazepam 0.5 mg three times a day for anxiety  6. Return to clinic in two months for 15 mins

## 2017-10-23 DIAGNOSIS — M5416 Radiculopathy, lumbar region: Secondary | ICD-10-CM | POA: Diagnosis not present

## 2017-10-23 DIAGNOSIS — Z6830 Body mass index (BMI) 30.0-30.9, adult: Secondary | ICD-10-CM | POA: Diagnosis not present

## 2017-10-23 DIAGNOSIS — M545 Low back pain: Secondary | ICD-10-CM | POA: Diagnosis not present

## 2017-10-26 ENCOUNTER — Other Ambulatory Visit (HOSPITAL_COMMUNITY): Payer: Self-pay | Admitting: Psychiatry

## 2017-10-26 ENCOUNTER — Telehealth (HOSPITAL_COMMUNITY): Payer: Self-pay | Admitting: *Deleted

## 2017-10-26 MED ORDER — METHYLPHENIDATE HCL 5 MG PO TABS: 5.0000 mg | ORAL_TABLET | Freq: Two times a day (BID) | ORAL | 0 refills | Status: DC

## 2017-10-26 NOTE — Telephone Encounter (Signed)
Dr Modesta Messing Patient called & stated that the Adderall is helping her & request to back on Ritalin. Patient asked if you could call her @ [phone]

## 2017-10-26 NOTE — Telephone Encounter (Signed)
Spoke with patient & she stated that she felt like the Ritalin wasn't no longer working but thought was due   to begin on it for so long. And said that now it has been a while since begin off of Ritalin that she would like to   try it again

## 2017-10-26 NOTE — Telephone Encounter (Signed)
I believe she wanted to discontinue Ritalin due to worsening in smoking. Please discuss this with the patient.

## 2017-10-26 NOTE — Telephone Encounter (Signed)
Ordered Ritalin 5 mg twice a day.

## 2017-10-30 DIAGNOSIS — Z471 Aftercare following joint replacement surgery: Secondary | ICD-10-CM | POA: Diagnosis not present

## 2017-11-02 DIAGNOSIS — M47816 Spondylosis without myelopathy or radiculopathy, lumbar region: Secondary | ICD-10-CM | POA: Diagnosis not present

## 2017-11-07 ENCOUNTER — Telehealth (HOSPITAL_COMMUNITY): Payer: Self-pay | Admitting: *Deleted

## 2017-11-07 NOTE — Telephone Encounter (Signed)
Advise her to stay on current dose until the next appointment or come back sooner for evaluation.

## 2017-11-07 NOTE — Telephone Encounter (Signed)
Dr Modesta Messing Patient called stating that 5 mg Ritalin 2 x/day isn't working well. Asked if could increase to 10 mg 2 x/day?

## 2017-11-07 NOTE — Telephone Encounter (Signed)
Spoke with patient & per provider advise :  her to stay on current dose until the next appointment or come back sooner for evaluation.             Patient making appointment to come in sooner for evualation

## 2017-11-09 ENCOUNTER — Ambulatory Visit (INDEPENDENT_AMBULATORY_CARE_PROVIDER_SITE_OTHER): Payer: Medicare HMO | Admitting: Psychiatry

## 2017-11-09 ENCOUNTER — Encounter (HOSPITAL_COMMUNITY): Payer: Self-pay | Admitting: Psychiatry

## 2017-11-09 VITALS — BP 115/76 | HR 88 | Ht 66.0 in | Wt 195.0 lb

## 2017-11-09 DIAGNOSIS — F0630 Mood disorder due to known physiological condition, unspecified: Secondary | ICD-10-CM

## 2017-11-09 DIAGNOSIS — F4310 Post-traumatic stress disorder, unspecified: Secondary | ICD-10-CM | POA: Diagnosis not present

## 2017-11-09 MED ORDER — METHYLPHENIDATE HCL 10 MG PO TABS: 10.0000 mg | ORAL_TABLET | Freq: Two times a day (BID) | ORAL | 0 refills | Status: DC

## 2017-11-09 NOTE — Patient Instructions (Addendum)
1. Continue Wellbutrin 100 mg twice a day 2. Continue Quetiapine 200 mg at night 3. Continue Lexapro 10 mg daily 4. Increase ritalin 10 mg twice a day  5. Continue clonazepam 0.5 mg three times a day for anxiety  6. Return to clinic in October
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
## 2017-11-10 DIAGNOSIS — M25661 Stiffness of right knee, not elsewhere classified: Secondary | ICD-10-CM | POA: Diagnosis not present

## 2017-11-10 DIAGNOSIS — M25562 Pain in left knee: Secondary | ICD-10-CM | POA: Diagnosis not present

## 2017-11-10 DIAGNOSIS — M25561 Pain in right knee: Secondary | ICD-10-CM | POA: Diagnosis not present

## 2017-11-10 DIAGNOSIS — M25461 Effusion, right knee: Secondary | ICD-10-CM | POA: Diagnosis not present

## 2017-11-14 DIAGNOSIS — N951 Menopausal and female climacteric states: Secondary | ICD-10-CM | POA: Diagnosis not present

## 2017-11-14 DIAGNOSIS — R635 Abnormal weight gain: Secondary | ICD-10-CM | POA: Diagnosis not present

## 2017-11-15 DIAGNOSIS — R232 Flushing: Secondary | ICD-10-CM | POA: Diagnosis not present

## 2017-11-15 DIAGNOSIS — F329 Major depressive disorder, single episode, unspecified: Secondary | ICD-10-CM | POA: Diagnosis not present

## 2017-11-15 DIAGNOSIS — R4586 Emotional lability: Secondary | ICD-10-CM | POA: Diagnosis not present

## 2017-11-15 DIAGNOSIS — E133312 Other specified diabetes mellitus with moderate nonproliferative diabetic retinopathy with macular edema, left eye: Secondary | ICD-10-CM | POA: Diagnosis not present

## 2017-11-15 DIAGNOSIS — R6882 Decreased libido: Secondary | ICD-10-CM | POA: Diagnosis not present

## 2017-11-15 DIAGNOSIS — M2550 Pain in unspecified joint: Secondary | ICD-10-CM | POA: Diagnosis not present

## 2017-11-15 DIAGNOSIS — Z72820 Sleep deprivation: Secondary | ICD-10-CM | POA: Diagnosis not present

## 2017-11-15 DIAGNOSIS — R5383 Other fatigue: Secondary | ICD-10-CM | POA: Diagnosis not present

## 2017-11-15 DIAGNOSIS — N898 Other specified noninflammatory disorders of vagina: Secondary | ICD-10-CM | POA: Diagnosis not present

## 2017-11-16 DIAGNOSIS — M47816 Spondylosis without myelopathy or radiculopathy, lumbar region: Secondary | ICD-10-CM | POA: Diagnosis not present

## 2017-11-27 ENCOUNTER — Ambulatory Visit (INDEPENDENT_AMBULATORY_CARE_PROVIDER_SITE_OTHER): Payer: Medicare HMO | Admitting: Licensed Clinical Social Worker

## 2017-11-27 DIAGNOSIS — F4310 Post-traumatic stress disorder, unspecified: Secondary | ICD-10-CM

## 2017-11-28 ENCOUNTER — Encounter (HOSPITAL_COMMUNITY): Payer: Self-pay | Admitting: Licensed Clinical Social Worker
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
## 2017-12-06 DIAGNOSIS — M5416 Radiculopathy, lumbar region: Secondary | ICD-10-CM | POA: Diagnosis not present

## 2017-12-06 DIAGNOSIS — M545 Low back pain: Secondary | ICD-10-CM | POA: Diagnosis not present

## 2017-12-06 DIAGNOSIS — M47816 Spondylosis without myelopathy or radiculopathy, lumbar region: Secondary | ICD-10-CM | POA: Diagnosis not present

## 2017-12-06 DIAGNOSIS — Z6829 Body mass index (BMI) 29.0-29.9, adult: Secondary | ICD-10-CM | POA: Diagnosis not present

## 2017-12-11 ENCOUNTER — Telehealth (HOSPITAL_COMMUNITY): Payer: Self-pay | Admitting: *Deleted

## 2017-12-11 ENCOUNTER — Other Ambulatory Visit (HOSPITAL_COMMUNITY): Payer: Self-pay | Admitting: Psychiatry

## 2017-12-11 MED ORDER — METHYLPHENIDATE HCL 10 MG PO TABS: 10.0000 mg | ORAL_TABLET | Freq: Two times a day (BID) | ORAL | 0 refills | Status: DC

## 2017-12-11 NOTE — Telephone Encounter (Signed)
ordered

## 2017-12-11 NOTE — Telephone Encounter (Signed)
Dr Modesta Messing  Rx called requesting refill for patient methylphenidate (RITALIN) 10 MG tablet

## 2017-12-12 DIAGNOSIS — E1140 Type 2 diabetes mellitus with diabetic neuropathy, unspecified: Secondary | ICD-10-CM | POA: Diagnosis not present

## 2017-12-12 DIAGNOSIS — Z79899 Other long term (current) drug therapy: Secondary | ICD-10-CM | POA: Diagnosis not present

## 2017-12-12 DIAGNOSIS — E039 Hypothyroidism, unspecified: Secondary | ICD-10-CM | POA: Diagnosis not present

## 2017-12-12 DIAGNOSIS — M1990 Unspecified osteoarthritis, unspecified site: Secondary | ICD-10-CM | POA: Diagnosis not present

## 2017-12-13 DIAGNOSIS — Z6830 Body mass index (BMI) 30.0-30.9, adult: Secondary | ICD-10-CM | POA: Diagnosis not present

## 2017-12-13 DIAGNOSIS — E133312 Other specified diabetes mellitus with moderate nonproliferative diabetic retinopathy with macular edema, left eye: Secondary | ICD-10-CM | POA: Diagnosis not present

## 2017-12-13 DIAGNOSIS — Z713 Dietary counseling and surveillance: Secondary | ICD-10-CM | POA: Diagnosis not present
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
## 2017-12-18 ENCOUNTER — Other Ambulatory Visit (HOSPITAL_COMMUNITY): Payer: Self-pay | Admitting: Internal Medicine

## 2017-12-18 DIAGNOSIS — E1140 Type 2 diabetes mellitus with diabetic neuropathy, unspecified: Secondary | ICD-10-CM | POA: Diagnosis not present

## 2017-12-18 DIAGNOSIS — Z1231 Encounter for screening mammogram for malignant neoplasm of breast: Secondary | ICD-10-CM

## 2017-12-18 DIAGNOSIS — Z0001 Encounter for general adult medical examination with abnormal findings: Secondary | ICD-10-CM | POA: Diagnosis not present

## 2017-12-18 DIAGNOSIS — Z6831 Body mass index (BMI) 31.0-31.9, adult: Secondary | ICD-10-CM | POA: Diagnosis not present

## 2017-12-18 DIAGNOSIS — E039 Hypothyroidism, unspecified: Secondary | ICD-10-CM | POA: Diagnosis not present

## 2017-12-18 DIAGNOSIS — Z23 Encounter for immunization: Secondary | ICD-10-CM | POA: Diagnosis not present

## 2017-12-18 DIAGNOSIS — I471 Supraventricular tachycardia: Secondary | ICD-10-CM | POA: Diagnosis not present

## 2017-12-18 DIAGNOSIS — E785 Hyperlipidemia, unspecified: Secondary | ICD-10-CM | POA: Diagnosis not present

## 2017-12-18 DIAGNOSIS — F319 Bipolar disorder, unspecified: Secondary | ICD-10-CM | POA: Diagnosis not present

## 2017-12-19 ENCOUNTER — Ambulatory Visit (HOSPITAL_COMMUNITY): Payer: Self-pay | Admitting: Psychiatry

## 2017-12-25 ENCOUNTER — Encounter: Payer: Self-pay | Admitting: Gastroenterology
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
## 2017-12-27 ENCOUNTER — Other Ambulatory Visit (HOSPITAL_COMMUNITY): Payer: Self-pay | Admitting: Psychiatry

## 2017-12-27 ENCOUNTER — Telehealth (HOSPITAL_COMMUNITY): Payer: Self-pay | Admitting: *Deleted

## 2017-12-27 MED ORDER — BUPROPION HCL ER (SR) 100 MG PO TB12: 100.0000 mg | ORAL_TABLET | Freq: Every day | ORAL | 0 refills | Status: DC

## 2017-12-27 MED ORDER — CLONAZEPAM 0.5 MG PO TABS: 0.5000 mg | ORAL_TABLET | Freq: Three times a day (TID) | ORAL | 0 refills | Status: DC | PRN

## 2017-12-27 NOTE — Telephone Encounter (Signed)
ordered

## 2017-12-27 NOTE — Telephone Encounter (Signed)
Dr Modesta Messing Requesting refills on clonazePAM (KLONOPIN) 0.5 MG tablet & buPROPion (WELLBUTRIN SR) 100 MG 12 hr tablet Next Visit is 01/03/18

## 2018-01-01 DIAGNOSIS — Z6831 Body mass index (BMI) 31.0-31.9, adult: Secondary | ICD-10-CM | POA: Diagnosis not present

## 2018-01-01 DIAGNOSIS — Z713 Dietary counseling and surveillance: Secondary | ICD-10-CM | POA: Diagnosis not present

## 2018-01-01 DIAGNOSIS — M2550 Pain in unspecified joint: Secondary | ICD-10-CM | POA: Diagnosis not present

## 2018-01-01 DIAGNOSIS — E133312 Other specified diabetes mellitus with moderate nonproliferative diabetic retinopathy with macular edema, left eye: Secondary | ICD-10-CM | POA: Diagnosis not present

## 2018-01-03 ENCOUNTER — Ambulatory Visit (HOSPITAL_COMMUNITY)
Admission: RE | Admit: 2018-01-03 | Discharge: 2018-01-03 | Disposition: A | Discharge status: Home / Self Care | Payer: Medicare HMO | Source: Ambulatory Visit | Attending: Internal Medicine | Admitting: Internal Medicine

## 2018-01-03 ENCOUNTER — Encounter (HOSPITAL_COMMUNITY): Payer: Self-pay | Admitting: Psychiatry

## 2018-01-03 ENCOUNTER — Ambulatory Visit (INDEPENDENT_AMBULATORY_CARE_PROVIDER_SITE_OTHER): Payer: Medicare HMO | Admitting: Psychiatry

## 2018-01-03 VITALS — BP 106/69 | HR 83 | Ht 66.0 in | Wt 194.0 lb

## 2018-01-03 DIAGNOSIS — F0630 Mood disorder due to known physiological condition, unspecified: Secondary | ICD-10-CM

## 2018-01-03 DIAGNOSIS — F4310 Post-traumatic stress disorder, unspecified: Secondary | ICD-10-CM | POA: Diagnosis not present

## 2018-01-03 DIAGNOSIS — Z1231 Encounter for screening mammogram for malignant neoplasm of breast: Secondary | ICD-10-CM | POA: Insufficient documentation

## 2018-01-03 MED ORDER — CLONAZEPAM 0.5 MG PO TABS: 0.5000 mg | ORAL_TABLET | Freq: Three times a day (TID) | ORAL | 2 refills | Status: DC | PRN

## 2018-01-03 MED ORDER — ESCITALOPRAM OXALATE 10 MG PO TABS
10.0000 mg | ORAL_TABLET | Freq: Every day | ORAL | 0 refills | Status: DC
Start: 2018-01-03 — End: 2018-04-04

## 2018-01-03 MED ORDER — METHYLPHENIDATE HCL 10 MG PO TABS: 10.0000 mg | ORAL_TABLET | Freq: Two times a day (BID) | ORAL | 0 refills | Status: DC

## 2018-01-03 MED ORDER — QUETIAPINE FUMARATE 200 MG PO TABS: 200.0000 mg | ORAL_TABLET | Freq: Every day | ORAL | 0 refills | Status: DC

## 2018-01-03 MED ORDER — BUPROPION HCL ER (SR) 100 MG PO TB12: 100.0000 mg | ORAL_TABLET | Freq: Every day | ORAL | 0 refills | Status: DC

## 2018-01-03 NOTE — Patient Instructions (Signed)
1. Continue Wellbutrin 100 mg twice a day 2. Continue Quetiapine 200 mg at night 3.Continue Lexapro10 mg daily 4.Continue ritalin 10 mg twice a day 5. Continue clonazepam 0.5 mg three times a day for anxiety  6.Return to clinic inthree months for 15 mins

## 2018-01-04 ENCOUNTER — Encounter: Payer: Self-pay | Admitting: Podiatry

## 2018-01-04 ENCOUNTER — Ambulatory Visit (INDEPENDENT_AMBULATORY_CARE_PROVIDER_SITE_OTHER): Payer: Medicare HMO | Admitting: Podiatry

## 2018-01-04 VITALS — BP 97/67 | HR 78

## 2018-01-04 DIAGNOSIS — M216X9 Other acquired deformities of unspecified foot: Secondary | ICD-10-CM

## 2018-01-04 DIAGNOSIS — E1140 Type 2 diabetes mellitus with diabetic neuropathy, unspecified: Secondary | ICD-10-CM

## 2018-01-04 DIAGNOSIS — L84 Corns and callosities: Secondary | ICD-10-CM

## 2018-01-04 DIAGNOSIS — E1149 Type 2 diabetes mellitus with other diabetic neurological complication: Secondary | ICD-10-CM | POA: Diagnosis not present

## 2018-01-04 NOTE — Progress Notes (Signed)
Subjective:   Patient ID: [name], female   DOB: 57 y.o.   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   Review of Systems  All other systems reviewed and are negative.       Objective:  Physical Exam  Constitutional: She appears well-developed and well-nourished.  Cardiovascular: Intact distal pulses.  Pulmonary/Chest: Effort normal.  Musculoskeletal: Normal range of motion.  Neurological: She is alert.  Skin: Skin is warm.  Nursing note and vitals reviewed.   Vascular status found to be intact but neurologically diminishment sharp dull vibratory is noted.  Patient was found to have a significant keratotic lesion left hallux medial side and severe lesion sub-fifth metatarsal left that is painful when palpated.  Patient does have good digital perfusion well oriented and does have mild swelling of her feet     Assessment:  At risk diabetic with significant lesion left hallux left fifth metatarsal pre-ulcerated in its nature     Plan:  H&P all conditions reviewed and diabetic education concerning feet were rendered to patient.  Sterile sharp debridement was accomplished today with no iatrogenic bleeding and discussed long-term diabetic shoes to reduce pressure and prevent long-term ulceration associated with her condition patient will get approval for this and will reappoint for diabetic shoe casting

## 2018-01-08 DIAGNOSIS — Z6830 Body mass index (BMI) 30.0-30.9, adult: Secondary | ICD-10-CM | POA: Diagnosis not present

## 2018-01-08 DIAGNOSIS — Z713 Dietary counseling and surveillance: Secondary | ICD-10-CM | POA: Diagnosis not present

## 2018-01-08 DIAGNOSIS — E133312 Other specified diabetes mellitus with moderate nonproliferative diabetic retinopathy with macular edema, left eye: Secondary | ICD-10-CM | POA: Diagnosis not present

## 2018-01-09 DIAGNOSIS — M47816 Spondylosis without myelopathy or radiculopathy, lumbar region: Secondary | ICD-10-CM | POA: Diagnosis not present

## 2018-01-16 DIAGNOSIS — M47816 Spondylosis without myelopathy or radiculopathy, lumbar region: Secondary | ICD-10-CM | POA: Diagnosis not present

## 2018-01-23 ENCOUNTER — Ambulatory Visit (INDEPENDENT_AMBULATORY_CARE_PROVIDER_SITE_OTHER): Payer: Medicare HMO | Admitting: Licensed Clinical Social Worker

## 2018-01-23 DIAGNOSIS — F4310 Post-traumatic stress disorder, unspecified: Secondary | ICD-10-CM | POA: Diagnosis not present

## 2018-01-24 ENCOUNTER — Encounter (HOSPITAL_COMMUNITY): Payer: Self-pay | Admitting: Licensed Clinical Social Worker

## 2018-01-24 NOTE — Progress Notes (Signed)
   THERAPIST PROGRESS NOTE  Session Time: 11:00 am-11:40 am  Participation Level: Active  Behavioral Response: CasualAlertDepressed  Type of Therapy: Individual Therapy  Treatment Goals addressed: Coping  Interventions: CBT and Solution Focused  Summary: [name] is a 57 y.o. female who presents with oriented x5 (person, place, situation, time and object), casually dressed, appropriately groomed, average height, overweight and cooperative to address mood. Patient has a history of mental health treatment including outpatient therapy and medication management. Patient has a history of medical treatment including diabetes, back pain and chronic pain. Patient denies suicidal and homicidal ideations. Patient denies psychosis including auditory and visual hallucinations. Patient denies substance abuse. Patient is at low risk for lethality at this time.   Physically: Patient has aches and pains.  Spiritually/values: No issues identified.  Relationships: Patient noted that her boyfriend is very supportive of her right now.  Emotionally/Mentally/Behavior: Patient has been experiencing stress. She has reminders of her son who passed away during this time of year including Thanksgiving, his birthday, when he passed, and Christmas. She has been struggling this year. She is giving herself a hard time because she thought that she was past this. After discussion, patient admitted that she is really struggling with her step son and son getting out of prison. They both have a history of addiction and they claim they are doing well. Patient has been told that by both of them I the past. She wants to support them but doesn't want to get burned. After discussion, patient understood that she needs to be supportive but also have a boundary where she will not go beyond related to supporting them and getting drug into their addiction.   Patient engaged in session. She responded well to interventions. Patient  continues to meet criteria for PTSD. Patient will continue in outpatient therapy due to being the least restrictive service to meet her needs. Patient made minimal progress on her goals.   Suicidal/Homicidal: Negativewithout intent/plan  Therapist Response: Therapist reviewed patient's recent thoughts and behaviors. Therapist utilized CBT to address mood. Therapist processed patient's feelings to identify triggers for mood. Therapist discussed patient's feelings of stress related to her son who passed away and her son and step son who have a history of addiction who are getting out of prison.   Plan: Return again in 4 weeks.  Diagnosis: Axis I: Post Traumatic Stress Disorder    Axis II: No diagnosis    Glori Bickers, LCSW 01/24/2018

## 2018-02-05 ENCOUNTER — Ambulatory Visit: Payer: Medicare HMO | Admitting: Orthotics

## 2018-02-08 DIAGNOSIS — Z6829 Body mass index (BMI) 29.0-29.9, adult: Secondary | ICD-10-CM | POA: Diagnosis not present

## 2018-02-08 DIAGNOSIS — M545 Low back pain: Secondary | ICD-10-CM | POA: Diagnosis not present

## 2018-02-12 NOTE — Progress Notes (Deleted)
Cardiology Office Note    Date:  02/12/2018   ID:  Beza, Steppe 06/22/1960, MRN [id number]  PCP:  Asencion Noble, MD  Cardiologist: No primary care provider on file.    No chief complaint on file.   History of Present Illness:    [name] is a 57 y.o. female with past medical history of HTN, HLD, Type 2 DM, GERD, atypical chest pain, fibromyalgia, and tobacco use who presents to the office today for annual follow-up.  She was last examined by Jory Sims, DNP in 01/2017 and denied any recent chest pain or dyspnea on exertion at that time. She was interested in quitting smoking and had previously been intolerant to Chantix in the past, therefore it was recommended she try Nicotine gum and also talk with Psychiatry about possibly adjusting her Wellbutrin dose.     Past Medical History:  Diagnosis Date  . Anxiety   . Bipolar disorder ([place])   . Complication of anesthesia   . Degenerative disc disease, lumbar   . Depression   . Diabetes mellitus   . Elevated cholesterol   . Fibromyalgia   . GERD (gastroesophageal reflux disease)   . Migraine   . Osteoarthritis   . PONV (postoperative nausea and vomiting)   . PTSD (post-traumatic stress disorder)    after death of son (accidental overdose)  . Thyroid disease     Past Surgical History:  Procedure Laterality Date  . CARPAL TUNNEL RELEASE  bilateral  . CHOLECYSTECTOMY    . COLONOSCOPY WITH PROPOFOL N/A 05/01/2012   JHE:RDEYC COLON polyps/Mild diverticulosis was noted in the sigmoid colon/Moderate sized internal hemorrhoids. path with tubular adenoma  . gallbladder    . KNEE ARTHROPLASTY Right 08/28/2017   Procedure: COMPUTER ASSISTED TOTAL KNEE ARTHROPLASTY;  Surgeon: Dereck Leep, MD;  Location: [facility] ORS;  Service: Orthopedics;  Laterality: Right;  . KNEE ARTHROSCOPY     bilateral  . KNEE ARTHROSCOPY WITH MEDIAL MENISECTOMY Right 10/29/2015   Procedure: ARTHROSCOPY RIGHT KNEE  WITH PARTIAL MEDIAL  MENISECTOMY;  Surgeon: Carole Civil, MD;  Location: [facility];  Service: Orthopedics;  Laterality: Right;  . POLYPECTOMY N/A 05/01/2012   Procedure: POLYPECTOMY;  Surgeon: Danie Binder, MD;  Location: [facility];  Service: Endoscopy;  Laterality: N/A;  . SHOULDER SURGERY  left  . VAGINAL HYSTERECTOMY      Current Medications: Outpatient Medications Prior to Visit  Medication Sig Dispense Refill  . aspirin EC 81 MG tablet Take by mouth.    Marland Kitchen buPROPion (WELLBUTRIN SR) 100 MG 12 hr tablet Take 1 tablet (100 mg total) by mouth daily. 90 tablet 0  . canagliflozin (INVOKANA) 300 MG TABS tablet Take 300 mg by mouth daily. 30 tablet   . clonazePAM (KLONOPIN) 0.5 MG tablet Take 1 tablet (0.5 mg total) by mouth 3 (three) times daily as needed for anxiety. 90 tablet 2  . Dulaglutide (TRULICITY) 1.5 XK/4.8JE SOPN Inject 0.5 mLs into the skin every Saturday.     . escitalopram (LEXAPRO) 10 MG tablet Take 1 tablet (10 mg total) by mouth daily. 90 tablet 0  . gabapentin (NEURONTIN) 800 MG tablet Take 1,600 mg by mouth 2 (two) times daily.     Marland Kitchen glipiZIDE (GLUCOTROL XL) 5 MG 24 hr tablet Take 5 mg by mouth daily with breakfast.    . ibuprofen (IBU) 800 MG tablet Take by mouth.    . levothyroxine (SYNTHROID, LEVOTHROID) 150 MCG tablet Take 150 mcg by mouth daily  before breakfast.    . Menthol, Topical Analgesic, (BIOFREEZE EX) Apply 1 application topically 3 (three) times daily as needed (for pain).    . metFORMIN (GLUCOPHAGE-XR) 500 MG 24 hr tablet Take 1,000 mg by mouth 2 (two) times daily.    . methocarbamol (ROBAXIN) 500 MG tablet Take 1 tablet (500 mg total) by mouth 4 (four) times daily. 90 tablet 5  . Methylnaltrexone Bromide (RELISTOR) 150 MG TABS Take 150 mg by mouth daily.    . methylphenidate (RITALIN) 10 MG tablet Take 1 tablet (10 mg total) by mouth 2 (two) times daily with breakfast and lunch. 60 tablet 0  . methylphenidate (RITALIN) 10 MG tablet Take 1 tablet (10 mg total) by mouth 2 (two)  times daily. 60 tablet 0  . [START ON 03/11/2018] methylphenidate (RITALIN) 10 MG tablet Take 1 tablet (10 mg total) by mouth 2 (two) times daily. 60 tablet 0  . Oxycodone HCl 10 MG TABS Take 10 mg by mouth 3 (three) times daily as needed (for pain).     . pantoprazole (PROTONIX) 40 MG tablet Take 40 mg by mouth daily before breakfast.    . pioglitazone (ACTOS) 15 MG tablet Take 15 mg by mouth daily.    . QUEtiapine (SEROQUEL) 200 MG tablet Take 1 tablet (200 mg total) by mouth at bedtime. 90 tablet 0  . simvastatin (ZOCOR) 40 MG tablet Take 1 tablet (40 mg total) by mouth daily. (Patient taking differently: Take 40 mg by mouth at bedtime. ) 30 tablet   . SUMAtriptan (IMITREX) 100 MG tablet One tablet po prn migraine.  May repeat in 2 hrs if needed.  Do not exceed 200 mg per 24 hrs. 5 tablet 0  . traMADol (ULTRAM) 50 MG tablet Take 1-2 tablets (50-100 mg total) by mouth every 4 (four) hours as needed for moderate pain. 60 tablet 0   No facility-administered medications prior to visit.      Allergies:   Patient has no known allergies.   Social History   Socioeconomic History  . Marital status: Single    Spouse name: Not on file  . Number of children: Not on file  . Years of education: Not on file  . Highest education level: Not on file  Occupational History  . Occupation: unemployed    Fish farm manager: UNEMPLOYED  Social Needs  . Financial resource strain: Not on file  . Food insecurity:    Worry: Not on file    Inability: Not on file  . Transportation needs:    Medical: Not on file    Non-medical: Not on file  Tobacco Use  . Smoking status: Current Every Day Smoker    Packs/day: 0.50    Years: 13.00    Pack years: 6.50    Types: Cigarettes  . Smokeless tobacco: Never Used  Substance and Sexual Activity  . Alcohol use: No  . Drug use: No  . Sexual activity: Never    Birth control/protection: Surgical  Lifestyle  . Physical activity:    Days per week: Not on file    Minutes  per session: Not on file  . Stress: Not on file  Relationships  . Social connections:    Talks on phone: Not on file    Gets together: Not on file    Attends religious service: Not on file    Active member of club or organization: Not on file    Attends meetings of clubs or organizations: Not on file  Relationship status: Not on file  Other Topics Concern  . Not on file  Social History Narrative  . Not on file     Family History:  The patient's ***family history includes Alcohol abuse in her brother; Anxiety disorder in her mother; Arthritis in her unknown relative; Cancer in her unknown relative; Cancer - Lung in her father; Depression in her mother and sister; Diabetes in her unknown relative; Drug abuse in her other, son, and son; Heart disease in her maternal grandfather and unknown relative; Heart failure in her mother and sister; Lung disease in her father and unknown relative; Stroke in her mother and paternal grandmother.   Review of Systems:   Please see the history of present illness.     General:  No chills, fever, night sweats or weight changes.  Cardiovascular:  No chest pain, dyspnea on exertion, edema, orthopnea, palpitations, paroxysmal nocturnal dyspnea. Dermatological: No rash, lesions/masses Respiratory: No cough, dyspnea Urologic: No hematuria, dysuria Abdominal:   No nausea, vomiting, diarrhea, bright red blood per rectum, melena, or hematemesis Neurologic:  No visual changes, wkns, changes in mental status. All other systems reviewed and are otherwise negative except as noted above.   Physical Exam:    VS:  There were no vitals taken for this visit.   General: Well developed, well nourished,female appearing in no acute distress. Head: Normocephalic, atraumatic, sclera non-icteric, no xanthomas, nares are without discharge.  Neck: No carotid bruits. JVD not elevated.  Lungs: Respirations regular and unlabored, without wheezes or rales.  Heart: ***Regular  rate and rhythm. No S3 or S4.  No murmur, no rubs, or gallops appreciated. Abdomen: Soft, non-tender, non-distended with normoactive bowel sounds. No hepatomegaly. No rebound/guarding. No obvious abdominal masses. Msk:  Strength and tone appear normal for age. No joint deformities or effusions. Extremities: No clubbing or cyanosis. No edema.  Distal pedal pulses are 2+ bilaterally. Neuro: Alert and oriented X 3. Moves all extremities spontaneously. No focal deficits noted. Psych:  Responds to questions appropriately with a normal affect. Skin: No rashes or lesions noted  Wt Readings from Last 3 Encounters:  10/10/17 197 lb (89.4 kg)  08/16/17 201 lb (91.2 kg)  02/13/17 206 lb (93.4 kg)        Studies/Labs Reviewed:   EKG:  EKG is*** ordered today.  The ekg ordered today demonstrates ***  Recent Labs: 08/16/2017: ALT 53 10/10/2017: BUN 17; Creatinine, Ser 0.77; Hemoglobin 14.7; Platelets 283; Potassium 4.2; Sodium 139   Lipid Panel No results found for: CHOL, TRIG, HDL, CHOLHDL, VLDL, LDLCALC, LDLDIRECT  Additional studies/ records that were reviewed today include:   NST: 05/2016  No diagnostic ST segment changes to indicate ischemia.  Small, mild intensity, fixed apical anteroseptal defect that is most consistent with breast attenuation.  This is a low risk study.  Nuclear stress EF: 81%  Echocardiogram: 05/2016 Study Conclusions  - Left ventricle: The cavity size was normal. Wall thickness was   increased in a pattern of mild LVH. Systolic function was normal.   The estimated ejection fraction was in the range of 60% to 65%.   Wall motion was normal; there were no regional wall motion   abnormalities. Doppler parameters are consistent with abnormal   left ventricular relaxation (grade 1 diastolic dysfunction). - Aortic valve: Mildly calcified annulus. Trileaflet. - Mitral valve: Calcified annulus. There was trivial regurgitation. - Right atrium: Central venous  pressure (est): 3 mm Hg. - Atrial septum: No defect or patent foramen ovale was identified. -  Tricuspid valve: There was trivial regurgitation. - Pulmonary arteries: Systolic pressure could not be accurately   estimated. - Pericardium, extracardiac: A prominent pericardial fat pad was   present.  Impressions:  - Mild LVH with LVEF 60-65% and grade 1 diastolic dysfunction.   Mildly calcified mitral and aortic annulus. Trivial mitral   regurgitation. Trivial tricuspid regurgitation. Prominent   pericardial fat pad.   Assessment:    No diagnosis found.   Plan:   In order of problems listed above:  1. ***    Medication Adjustments/Labs and Tests Ordered: Current medicines are reviewed at length with the patient today.  Concerns regarding medicines are outlined above.  Medication changes, Labs and Tests ordered today are listed in the Patient Instructions below. There are no Patient Instructions on file for this visit.   Signed, Erma Heritage, PA-C  02/12/2018 5:13 PM    [place]. [address] [place], [place] [address] Phone: [phone]

## 2018-02-13 ENCOUNTER — Ambulatory Visit: Payer: Self-pay | Admitting: Student

## 2018-02-17 IMAGING — CT CT ANGIO CHEST
2 of 6 series · 19 of 46 positions shown · IV contrast (Isovue)
Comparison: None.

CLINICAL DATA: Shortness of breath 2 weeks.  Chest pressure.

EXAM:
CT ANGIOGRAPHY CHEST WITH CONTRAST
TECHNIQUE: Multidetector CT imaging of the chest was performed using the
standard protocol during bolus administration of intravenous
contrast. Multiplanar CT image reconstructions and MIPs were
obtained to evaluate the vascular anatomy.
CONTRAST:  100 mL Isovue 370

[Series 5: thins · axial · 0.84mm/px · z∈[-273,+64]mm · 16 of 371 slices shown]
[im 17/371  lung]
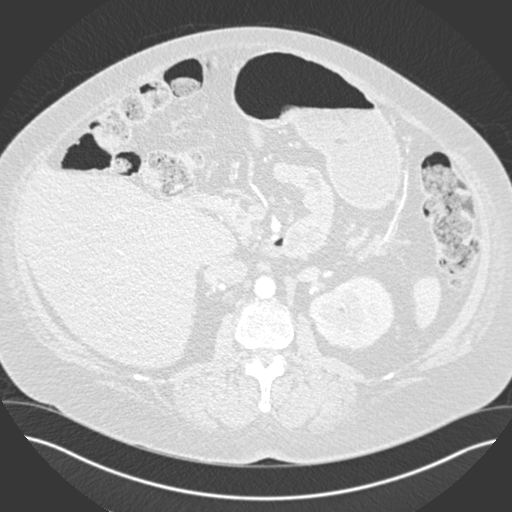
[im 49/371  soft-tissue]
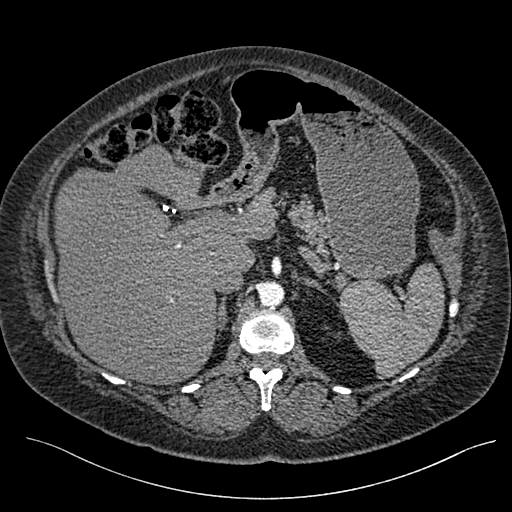
[im 65/371  lung]
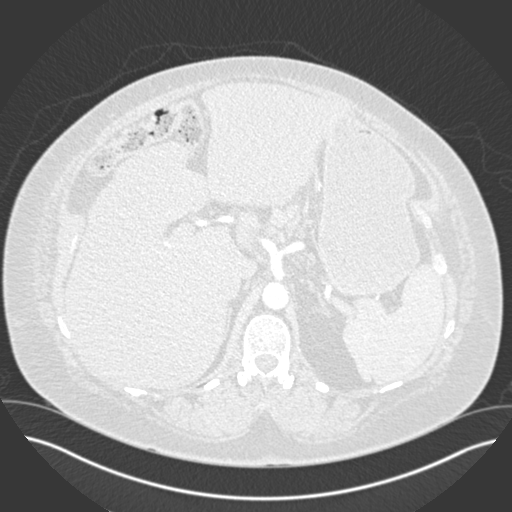
[im 81/371  soft-tissue]
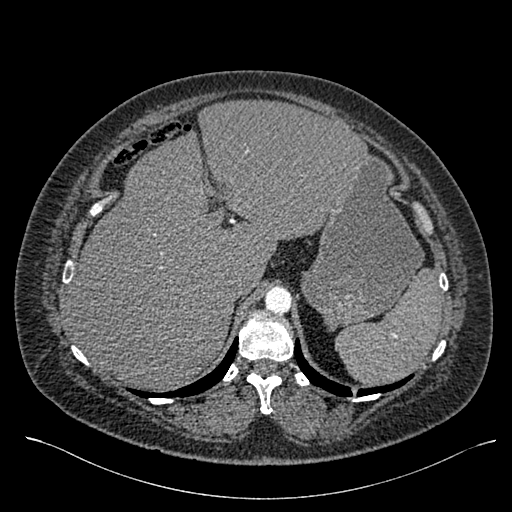
[im 113/371  lung]
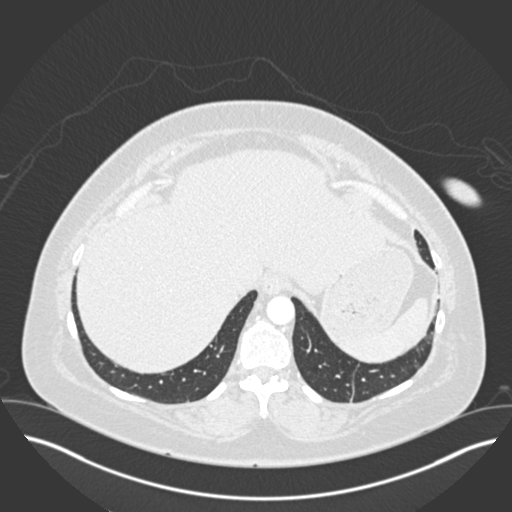
[im 129/371  soft-tissue]
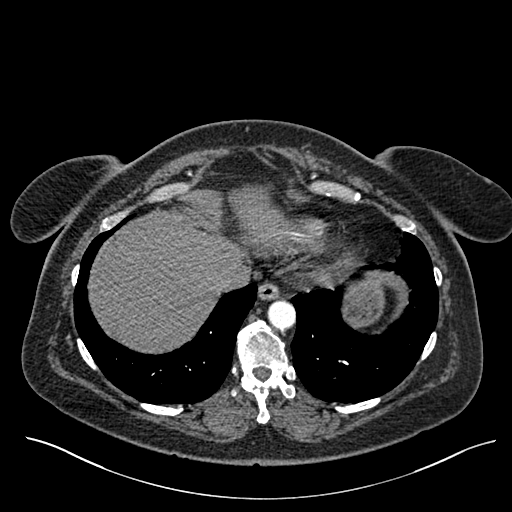
[im 145/371  lung]
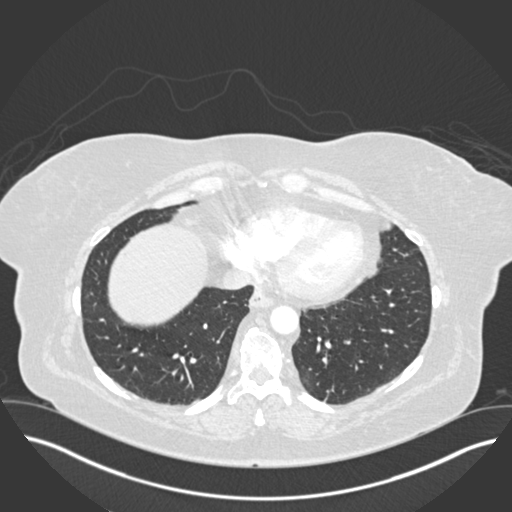
[im 177/371  soft-tissue]
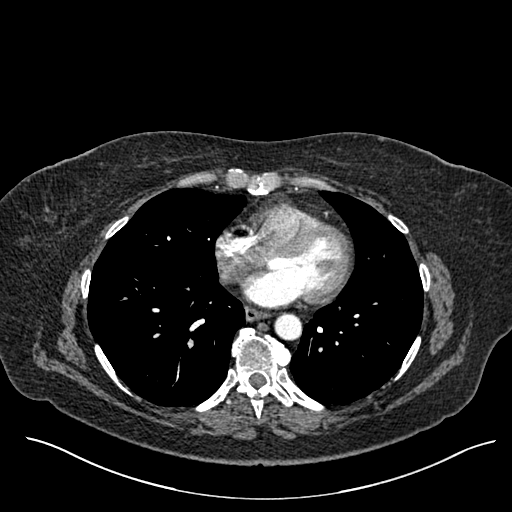
[im 194/371  lung]
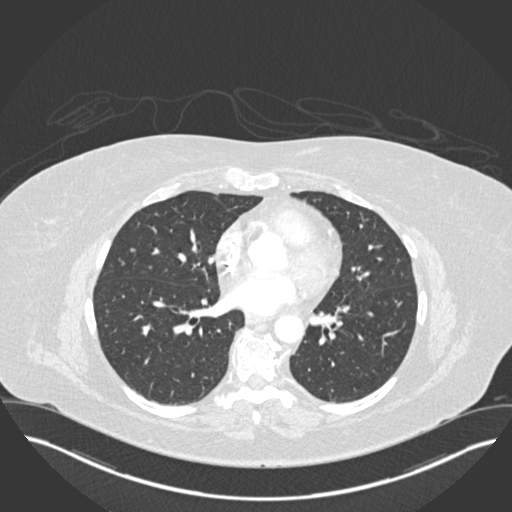
[im 226/371  soft-tissue]
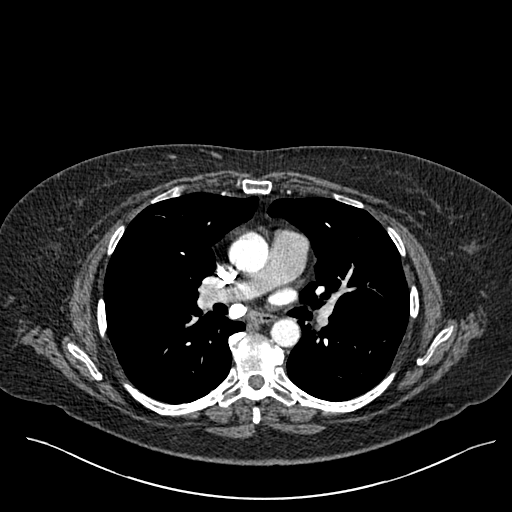
[im 242/371  lung]
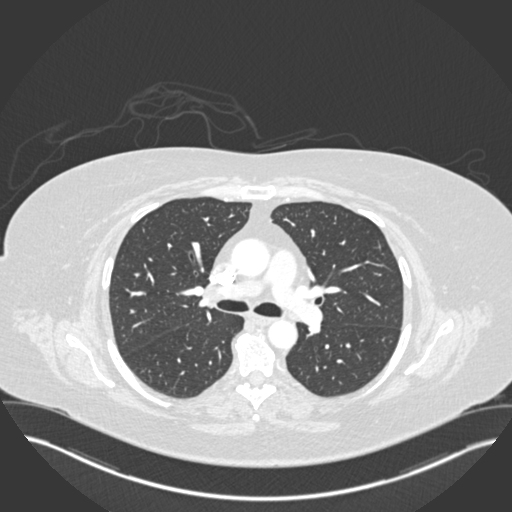
[im 258/371  soft-tissue]
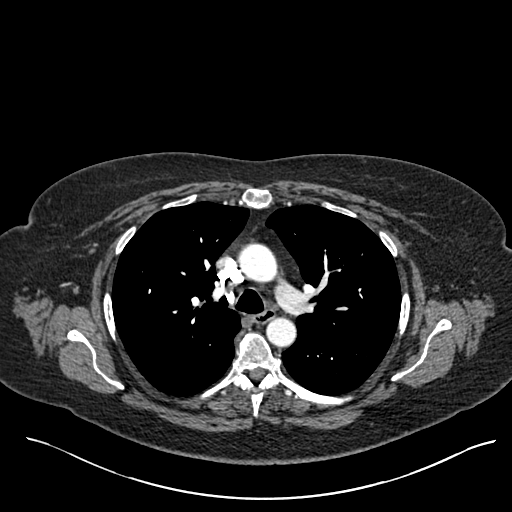
[im 290/371  lung]
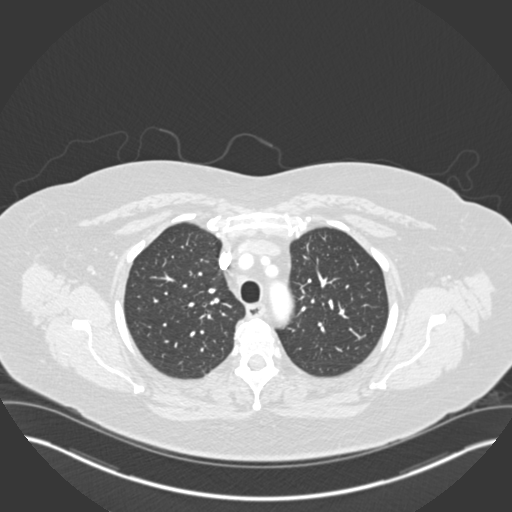
[im 306/371  soft-tissue]
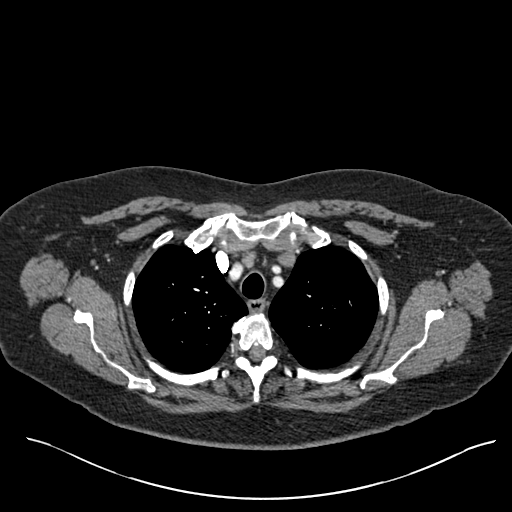
[im 322/371  lung]
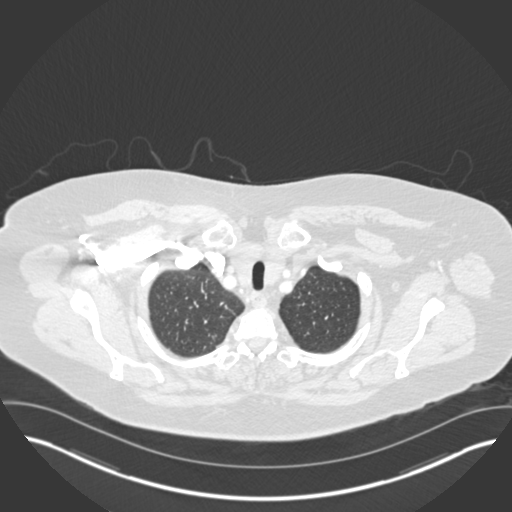
[im 354/371  soft-tissue]
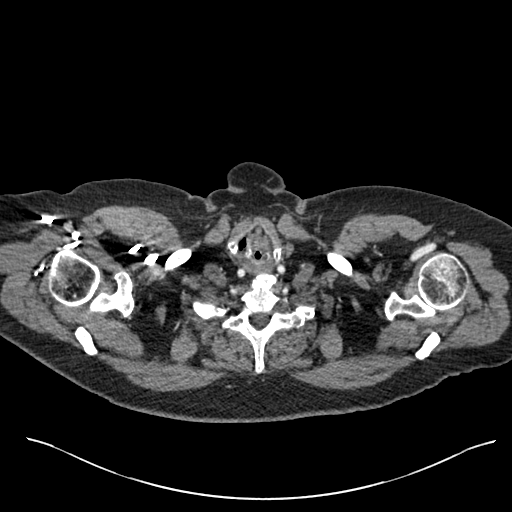

[Series 7: coronal mpr · coronal · 0.79mm/px · 3 of 169 slices shown]
[im 43/169  soft-tissue]
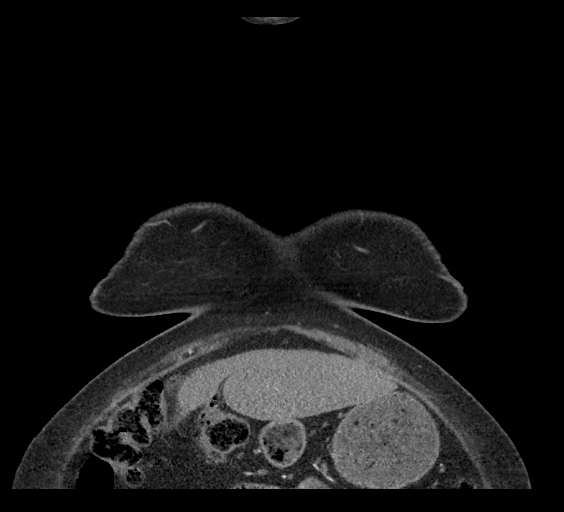
[im 85/169  soft-tissue]
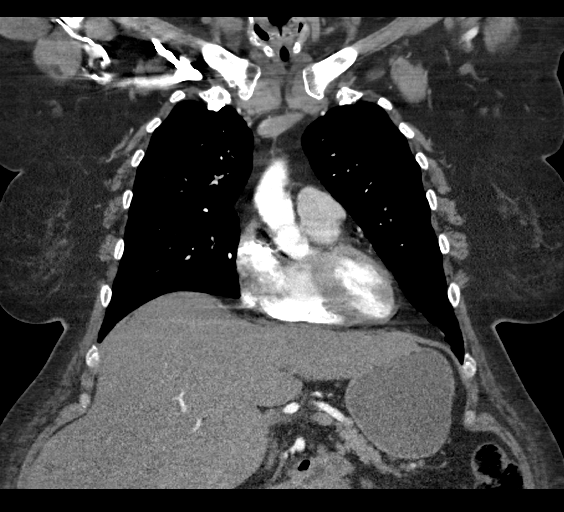
[im 127/169  soft-tissue]
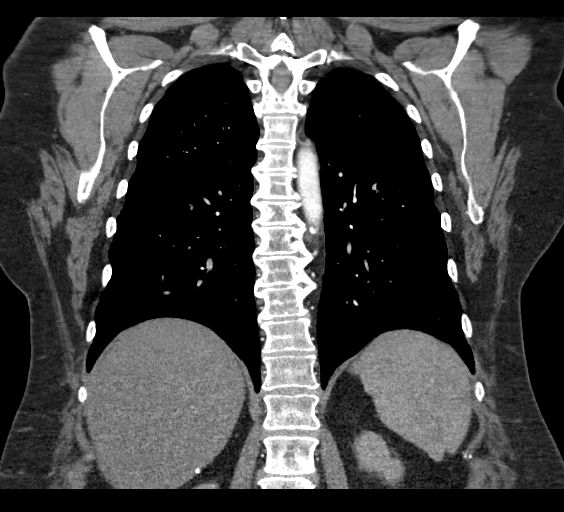

[19 of 46 positions shown; findings below may reference images not displayed]

FINDINGS: Cardiovascular: Suboptimal opacification of the pulmonary arteries
to the segmental level limiting evaluation for pulmonary embolus. No
large central pulmonary embolus. Normal heart size. No pericardial
effusion. Normal caliber thoracic aorta without dissection.

Mediastinum/Nodes: No enlarged mediastinal, hilar, or axillary lymph
nodes. Thyroid gland, trachea, and esophagus demonstrate no
significant findings.

Lungs/Pleura: Small linear area of airspace disease peripherally in
the right lower lobe likely reflecting mild scarring. No focal
consolidation. No pleural effusion or pneumothorax.

Upper Abdomen: No acute abnormality.

Musculoskeletal: No acute osseous abnormality. No lytic or sclerotic
osseous lesion.

Review of the MIP images confirms the above findings.
IMPRESSION: 1. Suboptimal opacification of the pulmonary arteries to the
segmental level limiting evaluation for pulmonary embolus. No large
central pulmonary embolus.
2. No thoracic aortic aneurysm or dissection.

## 2018-02-26 IMAGING — DX DG KNEE COMPLETE 4+V*R*
4 series · 4 of 4 positions shown · non-contrast
Comparison: MRI 10/14/2015

CLINICAL DATA: Knee pain.  Fall yesterday.

EXAM:
RIGHT KNEE - COMPLETE 4+ VIEW

[knee ap]
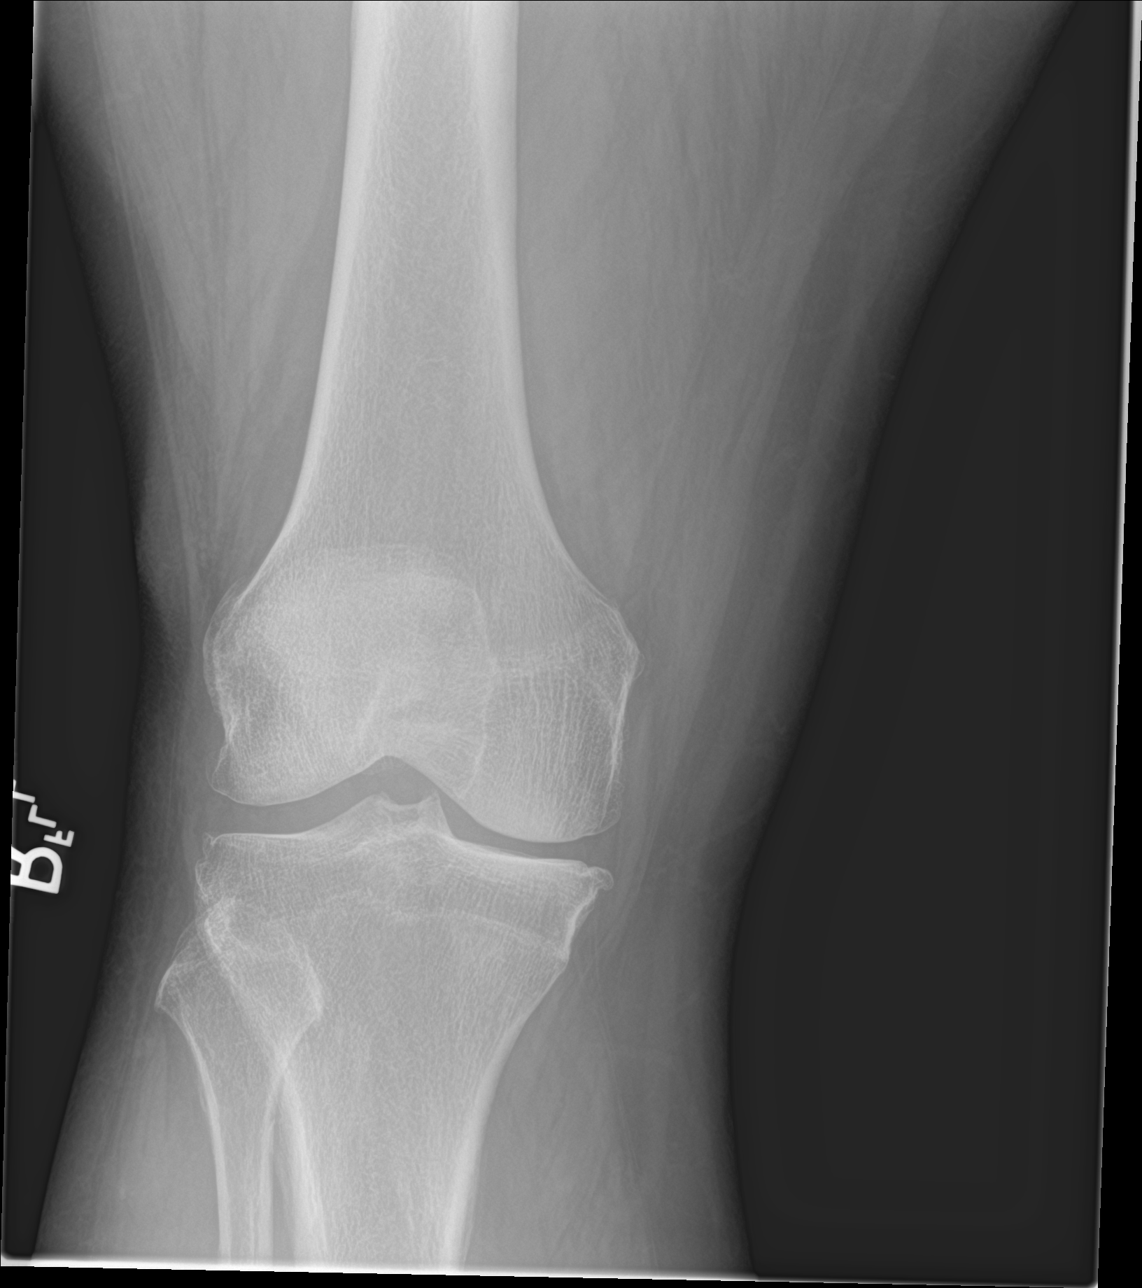

[tunnel]
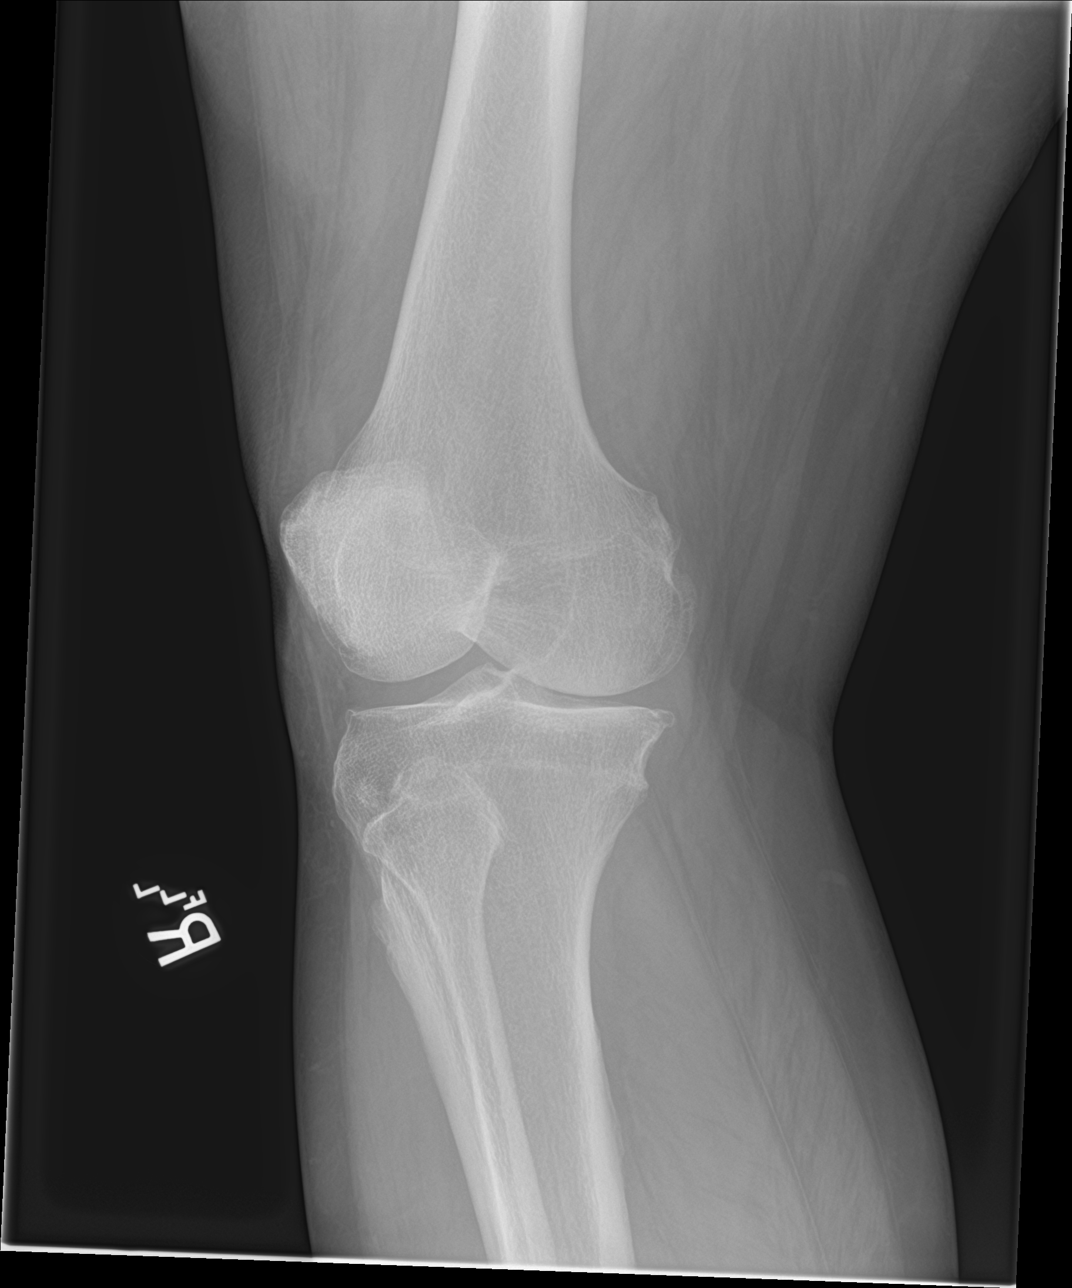

[knee lat]
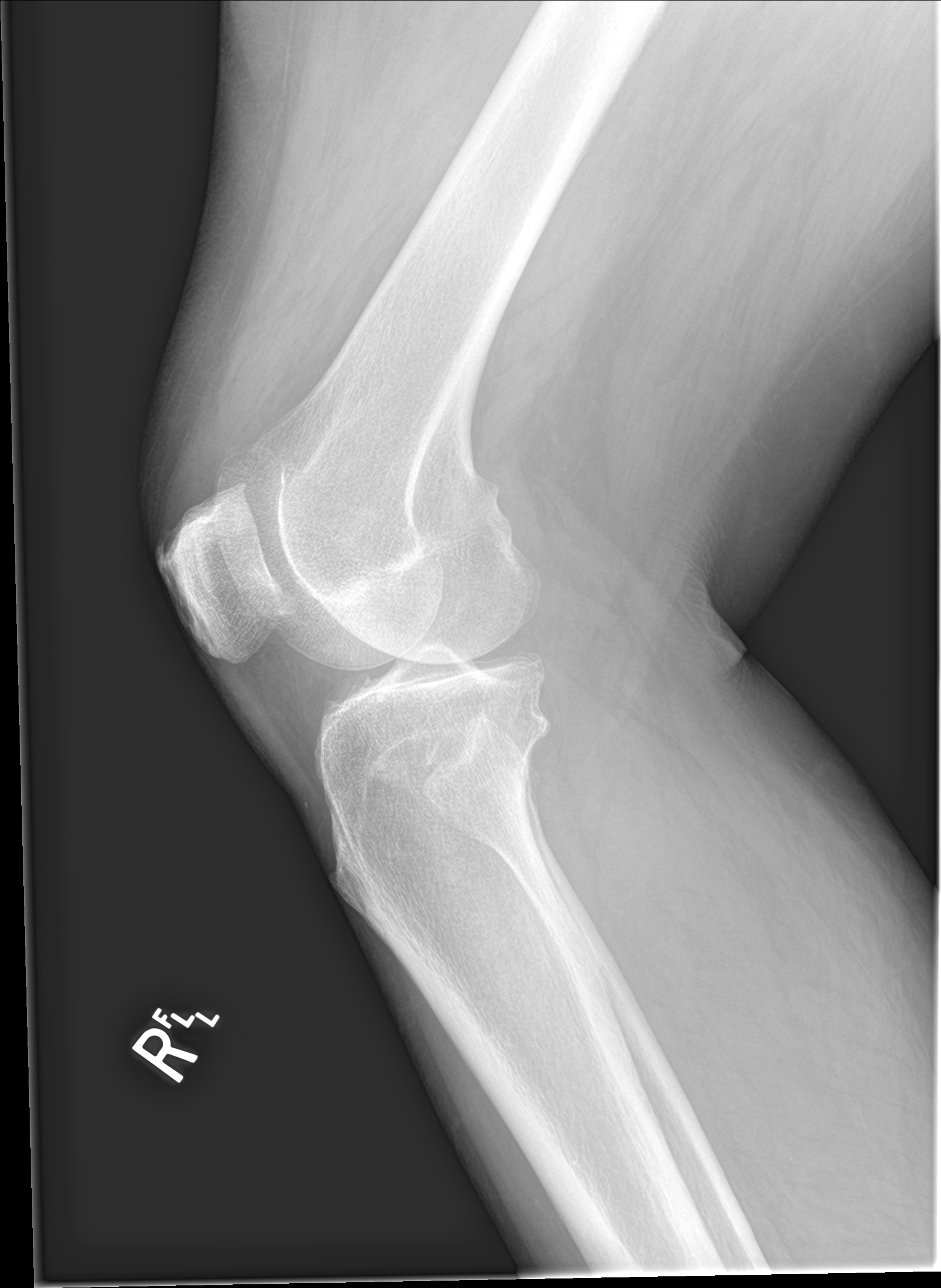

[knee sunrise]
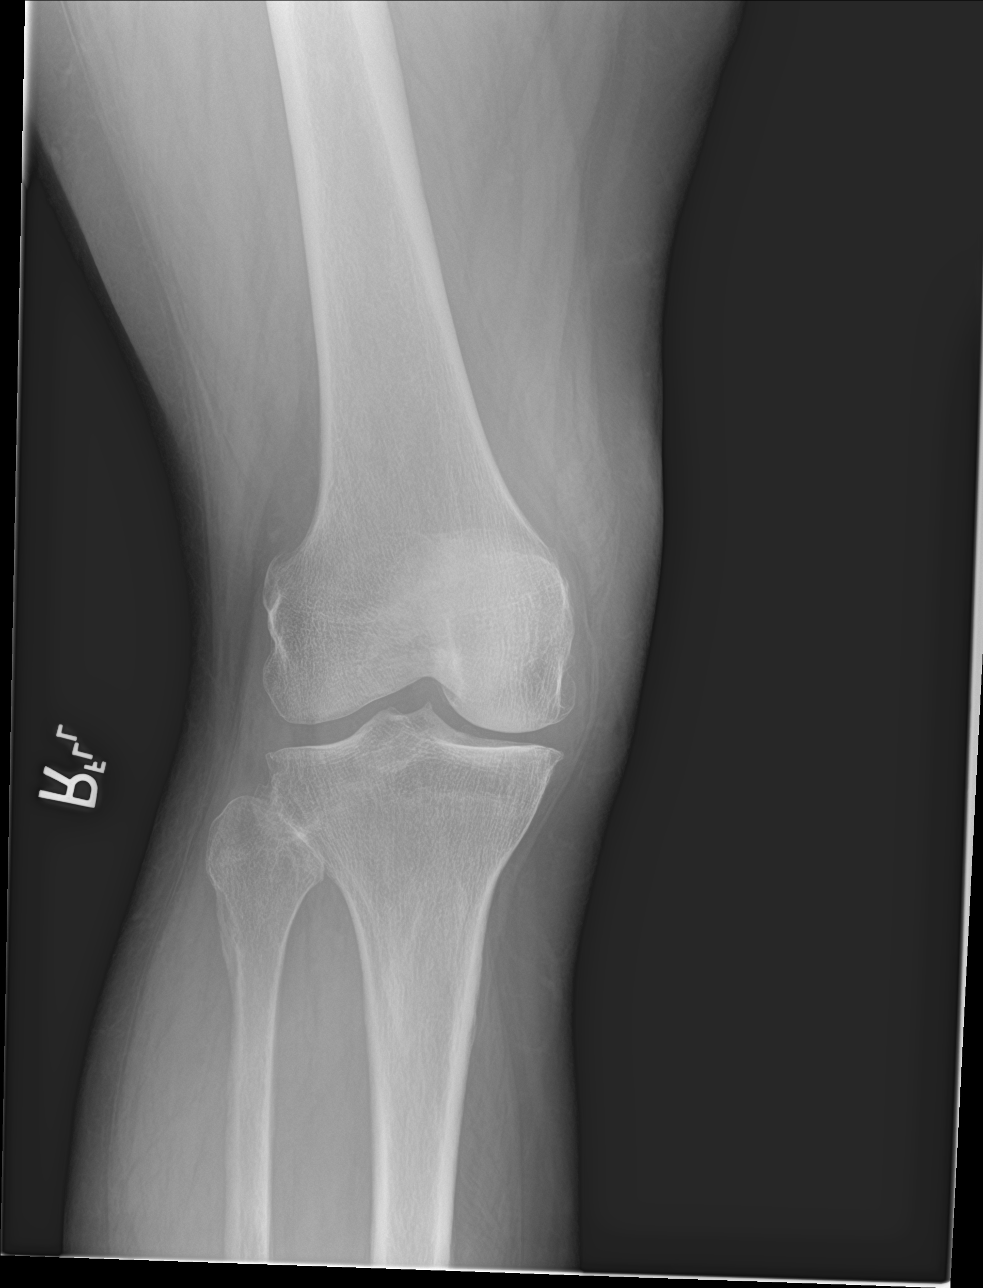

[4 of 4 positions shown; findings below may reference images not displayed]

FINDINGS: Mild degenerative changes with joint space narrowing and spurring,
most pronounced in the medial compartment. No acute bony
abnormality. Specifically, no fracture, subluxation, or dislocation.
Soft tissues are intact. No joint effusion.
IMPRESSION: Mild degenerative changes.  No acute bony abnormality.

## 2018-03-06 ENCOUNTER — Encounter (HOSPITAL_COMMUNITY): Payer: Self-pay | Admitting: Licensed Clinical Social Worker

## 2018-03-06 ENCOUNTER — Ambulatory Visit (INDEPENDENT_AMBULATORY_CARE_PROVIDER_SITE_OTHER): Payer: Medicare HMO | Admitting: Licensed Clinical Social Worker

## 2018-03-06 DIAGNOSIS — F4310 Post-traumatic stress disorder, unspecified: Secondary | ICD-10-CM

## 2018-03-06 DIAGNOSIS — F0630 Mood disorder due to known physiological condition, unspecified: Secondary | ICD-10-CM

## 2018-03-06 DIAGNOSIS — E1140 Type 2 diabetes mellitus with diabetic neuropathy, unspecified: Secondary | ICD-10-CM | POA: Diagnosis not present

## 2018-03-06 NOTE — Progress Notes (Signed)
   THERAPIST PROGRESS NOTE  Session Time: 11:00 am-11:50 am  Participation Level: Active  Behavioral Response: CasualAlertDepressed  Type of Therapy: Individual Therapy  Treatment Goals addressed: Coping  Interventions: CBT and Solution Focused  Summary: [name] is a 57 y.o. female who presents with oriented x5 (person, place, situation, time and object), casually dressed, appropriately groomed, average height, overweight and cooperative to address mood. Patient has a history of mental health treatment including outpatient therapy and medication management. Patient has a history of medical treatment including diabetes, back pain and chronic pain. Patient denies suicidal and homicidal ideations. Patient denies psychosis including auditory and visual hallucinations. Patient denies substance abuse. Patient is at low risk for lethality at this time.   Physically: No issues identified.  Spiritually/values: No issues identified.  Relationships: Patient has been grieving her son's passing. She felt bad because she was grieving her son more than her parents.   Emotionally/Mentally/Behavior: Patient has been grieving her son. She had a difficult day on his birthday but allowed herself to cry then she felt much better. She is preparing for his death date tomorrow. Patient is going to allow herself to cry if needs be. Patient also noted that she has not been doing things like sewing, painting, etc. Patient feels like she has been avoiding doing these things because she used to do them in the past and feels like they may bring up sad feelings. After discussion, patient understood that she needs to schedule time to do things that she used to enjoy and start with a small amount of time such as half an hour. This will reduce excuses and allow her to get back into doing things that used to bring her joy.   Patient engaged in session. She responded well to interventions. Patient continues to meet  criteria for PTSD. Patient will continue in outpatient therapy due to being the least restrictive service to meet her needs. Patient made minimal progress on her goals.   Suicidal/Homicidal: Negativewithout intent/plan  Therapist Response: Therapist reviewed patient's recent thoughts and behaviors. Therapist utilized CBT to address mood. Therapist processed patient's feelings to identify triggers for mood. Therapist discussed patient's grief and getting back into doing things/hobbies she used to enjoy.   Plan: Return again in 4 weeks.  Diagnosis: Axis I: Post Traumatic Stress Disorder    Axis II: No diagnosis    Glori Bickers, LCSW 03/06/2018

## 2018-03-07 DIAGNOSIS — Z6830 Body mass index (BMI) 30.0-30.9, adult: Secondary | ICD-10-CM | POA: Diagnosis not present

## 2018-03-07 DIAGNOSIS — M545 Low back pain: Secondary | ICD-10-CM | POA: Diagnosis not present

## 2018-03-12 ENCOUNTER — Ambulatory Visit: Payer: Medicare HMO | Admitting: Orthotics

## 2018-03-12 DIAGNOSIS — G4733 Obstructive sleep apnea (adult) (pediatric): Secondary | ICD-10-CM | POA: Diagnosis not present

## 2018-03-12 DIAGNOSIS — M48061 Spinal stenosis, lumbar region without neurogenic claudication: Secondary | ICD-10-CM | POA: Diagnosis not present

## 2018-03-12 DIAGNOSIS — E1140 Type 2 diabetes mellitus with diabetic neuropathy, unspecified: Secondary | ICD-10-CM | POA: Diagnosis not present

## 2018-03-12 DIAGNOSIS — M216X9 Other acquired deformities of unspecified foot: Secondary | ICD-10-CM

## 2018-03-12 DIAGNOSIS — E1149 Type 2 diabetes mellitus with other diabetic neurological complication: Principal | ICD-10-CM

## 2018-03-12 NOTE — Progress Notes (Signed)

## 2018-03-23 ENCOUNTER — Ambulatory Visit: Payer: Medicare HMO | Admitting: Podiatry

## 2018-03-29 ENCOUNTER — Ambulatory Visit (HOSPITAL_COMMUNITY): Payer: Medicare HMO | Admitting: Licensed Clinical Social Worker

## 2018-03-30 ENCOUNTER — Ambulatory Visit: Payer: Medicare HMO | Admitting: Podiatry

## 2018-04-04 ENCOUNTER — Encounter: Payer: Self-pay | Admitting: Gastroenterology

## 2018-04-04 ENCOUNTER — Ambulatory Visit (INDEPENDENT_AMBULATORY_CARE_PROVIDER_SITE_OTHER): Payer: Medicare HMO | Admitting: Gastroenterology

## 2018-04-04 VITALS — BP 96/66 | HR 93 | Temp 98.6°F | Ht 66.0 in | Wt 200.2 lb

## 2018-04-04 DIAGNOSIS — R197 Diarrhea, unspecified: Secondary | ICD-10-CM | POA: Diagnosis not present

## 2018-04-04 DIAGNOSIS — K760 Fatty (change of) liver, not elsewhere classified: Secondary | ICD-10-CM

## 2018-04-04 NOTE — Progress Notes (Addendum)
REVIEWED-NO ADDITIONAL RECOMMENDATIONS. Primary Care Physician:  Asencion Noble, MD Primary Gastroenterologist:  Dr. Oneida Alar   Chief Complaint  Patient presents with  . Colonoscopy    last tcs 2014  . Diarrhea    since Christmas day  . Abdominal Cramping    HPI:   [name] is a 58 y.o. female presenting today at the request of Dr. Willey Blade for surveillance colonoscopy.   History of fatty liver. Last colonoscopy in 2014 with tubular adenomas, surveillance was slated for 10 years. However, she now notes her father had colon cancer in his late 49s.   Has had diarrhea since Christmas night. Lomotil prescribed per Dr. Willey Blade. Today has already had 3 loose stools. When first started, was very loose and watery. Stomach is cramping all the time. Afraid to keep taking the Lomotil. Yesterday was fine but woke up this morning with diarrhea again. With or without food will have diarrhea. No nausea or vomiting. Had chills maybe one time. Possibly scant blood yesterday. Previously with OIC and had taken Relistor and then Symproic, both of which are expensive. Baseline constipation.   Exposure to antibiotics late September/early November due to history of knee replacement and needed dental cleaning. Sister with history of Cdiff. For the past 2-3 months, sister has had diarrhea but not diagnosed with Cdiff.   Now believes her father had colon cancer in his late 79s.   Past Medical History:  Diagnosis Date  . Anxiety   . Bipolar disorder ([place])   . Complication of anesthesia   . Degenerative disc disease, lumbar   . Depression   . Diabetes mellitus   . Elevated cholesterol   . Fibromyalgia   . GERD (gastroesophageal reflux disease)   . Migraine   . Osteoarthritis   . PONV (postoperative nausea and vomiting)   . PTSD (post-traumatic stress disorder)    after death of son (accidental overdose)  . Thyroid disease     Past Surgical History:  Procedure Laterality Date  . CARPAL TUNNEL  RELEASE  bilateral  . CHOLECYSTECTOMY    . COLONOSCOPY WITH PROPOFOL N/A 05/01/2012   IWO:EHOZY COLON polyps/Mild diverticulosis was noted in the sigmoid colon/Moderate sized internal hemorrhoids. path with tubular adenoma  . gallbladder    . KNEE ARTHROPLASTY Right 08/28/2017   Procedure: COMPUTER ASSISTED TOTAL KNEE ARTHROPLASTY;  Surgeon: Dereck Leep, MD;  Location: [facility];  Service: Orthopedics;  Laterality: Right;  . KNEE ARTHROSCOPY     bilateral  . KNEE ARTHROSCOPY WITH MEDIAL MENISECTOMY Right 10/29/2015   Procedure: ARTHROSCOPY RIGHT KNEE  WITH PARTIAL MEDIAL MENISECTOMY;  Surgeon: Carole Civil, MD;  Location: [facility];  Service: Orthopedics;  Laterality: Right;  . POLYPECTOMY N/A 05/01/2012   Procedure: POLYPECTOMY;  Surgeon: Danie Binder, MD;  Location: [facility];  Service: Endoscopy;  Laterality: N/A;  . SHOULDER SURGERY  left  . VAGINAL HYSTERECTOMY      Current Outpatient Medications  Medication Sig Dispense Refill  . aspirin EC 81 MG tablet Take 81 mg by mouth daily.     Marland Kitchen buPROPion (WELLBUTRIN SR) 100 MG 12 hr tablet Take 1 tablet (100 mg total) by mouth daily. 90 tablet 0  . canagliflozin (INVOKANA) 300 MG TABS tablet Take 300 mg by mouth daily. 30 tablet   . clonazePAM (KLONOPIN) 0.5 MG tablet Take 1 tablet (0.5 mg total) by mouth 3 (three) times daily as needed for anxiety. 90 tablet 2  . Dulaglutide (TRULICITY) 1.5 YQ/8.2NO SOPN Inject 0.5  mLs into the skin every Saturday.     . gabapentin (NEURONTIN) 800 MG tablet Take 1,600 mg by mouth 2 (two) times daily.     Marland Kitchen glipiZIDE (GLUCOTROL XL) 5 MG 24 hr tablet Take 5 mg by mouth daily with breakfast.    . ibuprofen (IBU) 800 MG tablet Take by mouth as needed.     Marland Kitchen levothyroxine (SYNTHROID, LEVOTHROID) 150 MCG tablet Take 150 mcg by mouth daily before breakfast.    . Menthol, Topical Analgesic, (BIOFREEZE EX) Apply 1 application topically 3 (three) times daily as needed (for pain).    . metFORMIN (GLUCOPHAGE-XR) 500  MG 24 hr tablet Take 1,000 mg by mouth 2 (two) times daily.    . methocarbamol (ROBAXIN) 500 MG tablet Take 1 tablet (500 mg total) by mouth 4 (four) times daily. (Patient taking differently: Take 500 mg by mouth 2 (two) times daily. ) 90 tablet 5  . methylphenidate (RITALIN) 10 MG tablet Take 1 tablet (10 mg total) by mouth 2 (two) times daily with breakfast and lunch. 60 tablet 0  . methylphenidate (RITALIN) 10 MG tablet Take 1 tablet (10 mg total) by mouth 2 (two) times daily. 60 tablet 0  . Oxycodone HCl 10 MG TABS Take 10 mg by mouth 3 (three) times daily as needed (for pain).     . pantoprazole (PROTONIX) 40 MG tablet Take 40 mg by mouth daily before breakfast.    . pioglitazone (ACTOS) 15 MG tablet Take 15 mg by mouth daily.    . QUEtiapine (SEROQUEL) 200 MG tablet Take 1 tablet (200 mg total) by mouth at bedtime. 90 tablet 0  . simvastatin (ZOCOR) 40 MG tablet Take 1 tablet (40 mg total) by mouth daily. (Patient taking differently: Take 40 mg by mouth at bedtime. ) 30 tablet   . SUMAtriptan (IMITREX) 100 MG tablet One tablet po prn migraine.  May repeat in 2 hrs if needed.  Do not exceed 200 mg per 24 hrs. 5 tablet 0  . methylphenidate (RITALIN) 10 MG tablet Take 1 tablet (10 mg total) by mouth 2 (two) times daily. 60 tablet 0   No current facility-administered medications for this visit.     Allergies as of 04/04/2018  . (No Known Allergies)    Family History  Problem Relation Age of Onset  . Cancer Other   . Diabetes Other   . Heart disease Other   . Arthritis Other   . Stroke Mother   . Anxiety disorder Mother   . Heart failure Mother   . Depression Mother   . Cancer - Lung Father   . Lung disease Father   . Colon cancer Father        father, diagnosed in his late 59s   . Heart failure Sister   . Depression Sister   . Alcohol abuse Brother   . Lung disease Other   . Heart disease Maternal Grandfather   . Stroke Paternal Grandmother   . Drug abuse Other   . Drug  abuse Son   . Drug abuse Son     Social History   Socioeconomic History  . Marital status: Single    Spouse name: Not on file  . Number of children: Not on file  . Years of education: Not on file  . Highest education level: Not on file  Occupational History  . Occupation: unemployed    Fish farm manager: UNEMPLOYED  Social Needs  . Financial resource strain: Not on file  . Food  insecurity:    Worry: Not on file    Inability: Not on file  . Transportation needs:    Medical: Not on file    Non-medical: Not on file  Tobacco Use  . Smoking status: Current Every Day Smoker    Packs/day: 0.50    Years: 13.00    Pack years: 6.50    Types: Cigarettes  . Smokeless tobacco: Never Used  Substance and Sexual Activity  . Alcohol use: No  . Drug use: No  . Sexual activity: Never    Birth control/protection: Surgical  Lifestyle  . Physical activity:    Days per week: Not on file    Minutes per session: Not on file  . Stress: Not on file  Relationships  . Social connections:    Talks on phone: Not on file    Gets together: Not on file    Attends religious service: Not on file    Active member of club or organization: Not on file    Attends meetings of clubs or organizations: Not on file    Relationship status: Not on file  . Intimate partner violence:    Fear of current or ex partner: Not on file    Emotionally abused: Not on file    Physically abused: Not on file    Forced sexual activity: Not on file  Other Topics Concern  . Not on file  Social History Narrative  . Not on file    Review of Systems: Gen: Denies any fever, chills, fatigue, weight loss, lack of appetite.  CV: Denies chest pain, heart palpitations, peripheral edema, syncope.  Resp: Denies shortness of breath at rest or with exertion. Denies wheezing or cough.  GI: see HPI  GU : Denies urinary burning, urinary frequency, urinary hesitancy MS: Denies joint pain, muscle weakness, cramps, or limitation of movement.    Derm: Denies rash, itching, dry skin Psych: Denies depression, anxiety, memory loss, and confusion Heme: Denies bruising, bleeding, and enlarged lymph nodes.  Physical Exam: BP 96/66   Pulse 93   Temp 98.6 F (37 C) (Oral)   Ht '5\' 6"'  (1.676 m)   Wt 200 lb 3.2 oz (90.8 kg)   BMI 32.31 kg/m  General:   Alert and oriented. Pleasant and cooperative. Well-nourished and well-developed.  Head:  Normocephalic and atraumatic. Eyes:  Without icterus, sclera clear and conjunctiva pink.  Ears:  Normal auditory acuity. Nose:  No deformity, discharge,  or lesions. Mouth:  No deformity or lesions, oral mucosa pink.  Lungs:  Clear to auscultation bilaterally. No wheezes, rales, or rhonchi. No distress.  Heart:  S1, S2 present without murmurs appreciated.  Abdomen:  +BS, soft, mild TTP LLQ and non-distended. No HSM noted. No guarding or rebound. No masses appreciated.  Rectal:  Deferred  Msk:  Symmetrical without gross deformities. Normal posture. Extremities:  Without clubbing or edema. Neurologic:  Alert and  oriented x4;  grossly normal neurologically. Psych:  Alert and cooperative. Normal mood and affect.  Outside labs Sept 2019: TSH 1.600, A1c 7.3, Hgb 15.2, Platelets 301, Tbili 0.3, Alk Phos 86, AST 27, ALT 34

## 2018-04-04 NOTE — Assessment & Plan Note (Signed)
Recent LFTs normal. Excellent weight loss that has been purposeful through dietary and behavior changes. Continue efforts.

## 2018-04-04 NOTE — Assessment & Plan Note (Signed)
58 year old pleasant female with new onset diarrhea starting Christmas, significantly changed from baseline of constipation. Concern for Cdiff due to history of exposure to antibiotics several months ago and sister with history of Cdiff. She also tells me that her father had colon cancer in his late 40s. Originally, she was slated for colonoscopy in 2024, but we will likely need to pursue now once infectious process ruled out due to her family history and personal history of polyps.  Cdiff, culture, giardia today Discussed risks and benefits of colonoscopy in future if negative stool studies. Would need Propofol due to polypharmacy Will send in Bentyl if negative stool studies Avoid Lomotil for now

## 2018-04-04 NOTE — Patient Instructions (Signed)
Please complete the stool samples. We will call with results as soon as they return.  We will arrange a colonoscopy if stool studies are negative. If positive, we will treat appropriately and then arrange a colonoscopy after symptoms have improved.  Further recommendations to follow!  It was a pleasure to see you today. I strive to create trusting relationships with patients to provide genuine, compassionate, and quality care. I value your feedback. If you receive a survey regarding your visit,  I greatly appreciate you taking time to fill this out.   Annitta Needs, PhD, ANP-BC [facility]

## 2018-04-04 NOTE — Progress Notes (Deleted)
Cardiology Office Note    Date:  04/04/2018   ID:  Hibo, Blasdell [birth year], MRN [id number]  PCP:  Asencion Noble, MD  Cardiologist: No primary care provider on file.    No chief complaint on file.   History of Present Illness:    [name] is a 57 y.o. female  with past medical history of HTN, HLD, Type 2 DM, GERD, atypical chest pain, fibromyalgia, and tobacco use who presents to the office today for annual follow-up.   She was last examined by Jory Sims, DNP in 01/2017 and denied any recent chest pain or dyspnea on exertion at that time. She was interested in quitting smoking and had previously been intolerant to Chantix in the past, therefore it was recommended she try Nicotine gum and also talk with Psychiatry about possibly adjusting her Wellbutrin dose.    Past Medical History:  Diagnosis Date  . Anxiety   . Bipolar disorder ([place])   . Complication of anesthesia   . Degenerative disc disease, lumbar   . Depression   . Diabetes mellitus   . Elevated cholesterol   . Fibromyalgia   . GERD (gastroesophageal reflux disease)   . Migraine   . Osteoarthritis   . PONV (postoperative nausea and vomiting)   . PTSD (post-traumatic stress disorder)    after death of son (accidental overdose)  . Thyroid disease     Past Surgical History:  Procedure Laterality Date  . CARPAL TUNNEL RELEASE  bilateral  . CHOLECYSTECTOMY    . COLONOSCOPY WITH PROPOFOL N/A 05/01/2012   TGG:YIRSW COLON polyps/Mild diverticulosis was noted in the sigmoid colon/Moderate sized internal hemorrhoids. path with tubular adenoma  . gallbladder    . KNEE ARTHROPLASTY Right 08/28/2017   Procedure: COMPUTER ASSISTED TOTAL KNEE ARTHROPLASTY;  Surgeon: Dereck Leep, MD;  Location: [facility];  Service: Orthopedics;  Laterality: Right;  . KNEE ARTHROSCOPY     bilateral  . KNEE ARTHROSCOPY WITH MEDIAL MENISECTOMY Right 10/29/2015   Procedure: ARTHROSCOPY RIGHT KNEE  WITH PARTIAL MEDIAL  MENISECTOMY;  Surgeon: Carole Civil, MD;  Location: [facility];  Service: Orthopedics;  Laterality: Right;  . POLYPECTOMY N/A 05/01/2012   Procedure: POLYPECTOMY;  Surgeon: Danie Binder, MD;  Location: [facility];  Service: Endoscopy;  Laterality: N/A;  . SHOULDER SURGERY  left  . VAGINAL HYSTERECTOMY      Current Medications: Outpatient Medications Prior to Visit  Medication Sig Dispense Refill  . aspirin EC 81 MG tablet Take 81 mg by mouth daily.     Marland Kitchen buPROPion (WELLBUTRIN SR) 100 MG 12 hr tablet Take 1 tablet (100 mg total) by mouth daily. 90 tablet 0  . canagliflozin (INVOKANA) 300 MG TABS tablet Take 300 mg by mouth daily. 30 tablet   . clonazePAM (KLONOPIN) 0.5 MG tablet Take 1 tablet (0.5 mg total) by mouth 3 (three) times daily as needed for anxiety. 90 tablet 2  . Dulaglutide (TRULICITY) 1.5 NI/6.2VO SOPN Inject 0.5 mLs into the skin every Saturday.     . gabapentin (NEURONTIN) 800 MG tablet Take 1,600 mg by mouth 2 (two) times daily.     Marland Kitchen glipiZIDE (GLUCOTROL XL) 5 MG 24 hr tablet Take 5 mg by mouth daily with breakfast.    . ibuprofen (IBU) 800 MG tablet Take by mouth as needed.     Marland Kitchen levothyroxine (SYNTHROID, LEVOTHROID) 150 MCG tablet Take 150 mcg by mouth daily before breakfast.    . Menthol, Topical Analgesic, (BIOFREEZE EX)  Apply 1 application topically 3 (three) times daily as needed (for pain).    . metFORMIN (GLUCOPHAGE-XR) 500 MG 24 hr tablet Take 1,000 mg by mouth 2 (two) times daily.    . methocarbamol (ROBAXIN) 500 MG tablet Take 1 tablet (500 mg total) by mouth 4 (four) times daily. (Patient taking differently: Take 500 mg by mouth 2 (two) times daily. ) 90 tablet 5  . methylphenidate (RITALIN) 10 MG tablet Take 1 tablet (10 mg total) by mouth 2 (two) times daily with breakfast and lunch. 60 tablet 0  . methylphenidate (RITALIN) 10 MG tablet Take 1 tablet (10 mg total) by mouth 2 (two) times daily. 60 tablet 0  . methylphenidate (RITALIN) 10 MG tablet Take 1 tablet  (10 mg total) by mouth 2 (two) times daily. 60 tablet 0  . Oxycodone HCl 10 MG TABS Take 10 mg by mouth 3 (three) times daily as needed (for pain).     . pantoprazole (PROTONIX) 40 MG tablet Take 40 mg by mouth daily before breakfast.    . pioglitazone (ACTOS) 15 MG tablet Take 15 mg by mouth daily.    . QUEtiapine (SEROQUEL) 200 MG tablet Take 1 tablet (200 mg total) by mouth at bedtime. 90 tablet 0  . simvastatin (ZOCOR) 40 MG tablet Take 1 tablet (40 mg total) by mouth daily. (Patient taking differently: Take 40 mg by mouth at bedtime. ) 30 tablet   . SUMAtriptan (IMITREX) 100 MG tablet One tablet po prn migraine.  May repeat in 2 hrs if needed.  Do not exceed 200 mg per 24 hrs. 5 tablet 0   No facility-administered medications prior to visit.      Allergies:   Patient has no known allergies.   Social History   Socioeconomic History  . Marital status: Single    Spouse name: Not on file  . Number of children: Not on file  . Years of education: Not on file  . Highest education level: Not on file  Occupational History  . Occupation: unemployed    Fish farm manager: UNEMPLOYED  Social Needs  . Financial resource strain: Not on file  . Food insecurity:    Worry: Not on file    Inability: Not on file  . Transportation needs:    Medical: Not on file    Non-medical: Not on file  Tobacco Use  . Smoking status: Current Every Day Smoker    Packs/day: 0.50    Years: 13.00    Pack years: 6.50    Types: Cigarettes  . Smokeless tobacco: Never Used  Substance and Sexual Activity  . Alcohol use: No  . Drug use: No  . Sexual activity: Never    Birth control/protection: Surgical  Lifestyle  . Physical activity:    Days per week: Not on file    Minutes per session: Not on file  . Stress: Not on file  Relationships  . Social connections:    Talks on phone: Not on file    Gets together: Not on file    Attends religious service: Not on file    Active member of club or organization: Not on  file    Attends meetings of clubs or organizations: Not on file    Relationship status: Not on file  Other Topics Concern  . Not on file  Social History Narrative  . Not on file     Family History:  The patient's ***family history includes Alcohol abuse in her brother; Anxiety disorder in her  mother; Arthritis in an other family member; Cancer in an other family member; Cancer - Lung in her father; Colon cancer in her father; Depression in her mother and sister; Diabetes in an other family member; Drug abuse in her son, son and another family member; Heart disease in her maternal grandfather and another family member; Heart failure in her mother and sister; Lung disease in her father and another family member; Stroke in her mother and paternal grandmother.   Review of Systems:   Please see the history of present illness.     General:  No chills, fever, night sweats or weight changes.  Cardiovascular:  No chest pain, dyspnea on exertion, edema, orthopnea, palpitations, paroxysmal nocturnal dyspnea. Dermatological: No rash, lesions/masses Respiratory: No cough, dyspnea Urologic: No hematuria, dysuria Abdominal:   No nausea, vomiting, diarrhea, bright red blood per rectum, melena, or hematemesis Neurologic:  No visual changes, wkns, changes in mental status. All other systems reviewed and are otherwise negative except as noted above.   Physical Exam:    VS:  There were no vitals taken for this visit.   General: Well developed, well nourished,female appearing in no acute distress. Head: Normocephalic, atraumatic, sclera non-icteric, no xanthomas, nares are without discharge.  Neck: No carotid bruits. JVD not elevated.  Lungs: Respirations regular and unlabored, without wheezes or rales.  Heart: ***Regular rate and rhythm. No S3 or S4.  No murmur, no rubs, or gallops appreciated. Abdomen: Soft, non-tender, non-distended with normoactive bowel sounds. No hepatomegaly. No rebound/guarding.  No obvious abdominal masses. Msk:  Strength and tone appear normal for age. No joint deformities or effusions. Extremities: No clubbing or cyanosis. No edema.  Distal pedal pulses are 2+ bilaterally. Neuro: Alert and oriented X 3. Moves all extremities spontaneously. No focal deficits noted. Psych:  Responds to questions appropriately with a normal affect. Skin: No rashes or lesions noted  Wt Readings from Last 3 Encounters:  04/04/18 200 lb 3.2 oz (90.8 kg)  10/10/17 197 lb (89.4 kg)  08/16/17 201 lb (91.2 kg)        Studies/Labs Reviewed:   EKG:  EKG is*** ordered today.  The ekg ordered today demonstrates ***  Recent Labs: 08/16/2017: ALT 53 10/10/2017: BUN 17; Creatinine, Ser 0.77; Hemoglobin 14.7; Platelets 283; Potassium 4.2; Sodium 139   Lipid Panel No results found for: CHOL, TRIG, HDL, CHOLHDL, VLDL, LDLCALC, LDLDIRECT  Additional studies/ records that were reviewed today include:   Echocardiogram: 05/2016 Study Conclusions  - Left ventricle: The cavity size was normal. Wall thickness was   increased in a pattern of mild LVH. Systolic function was normal.   The estimated ejection fraction was in the range of 60% to 65%.   Wall motion was normal; there were no regional wall motion   abnormalities. Doppler parameters are consistent with abnormal   left ventricular relaxation (grade 1 diastolic dysfunction). - Aortic valve: Mildly calcified annulus. Trileaflet. - Mitral valve: Calcified annulus. There was trivial regurgitation. - Right atrium: Central venous pressure (est): 3 mm Hg. - Atrial septum: No defect or patent foramen ovale was identified. - Tricuspid valve: There was trivial regurgitation. - Pulmonary arteries: Systolic pressure could not be accurately   estimated. - Pericardium, extracardiac: A prominent pericardial fat pad was   present.  Impressions:  - Mild LVH with LVEF 60-65% and grade 1 diastolic dysfunction.   Mildly calcified mitral and  aortic annulus. Trivial mitral   regurgitation. Trivial tricuspid regurgitation. Prominent   pericardial fat pad.  NST: 05/2016  No diagnostic ST segment changes to indicate ischemia.  Small, mild intensity, fixed apical anteroseptal defect that is most consistent with breast attenuation.  This is a low risk study.  Nuclear stress EF: 81%.  Assessment:    No diagnosis found.   Plan:   In order of problems listed above:  1. ***    Medication Adjustments/Labs and Tests Ordered: Current medicines are reviewed at length with the patient today.  Concerns regarding medicines are outlined above.  Medication changes, Labs and Tests ordered today are listed in the Patient Instructions below. There are no Patient Instructions on file for this visit.   Signed, Erma Heritage, PA-C  04/04/2018 5:16 PM    [facility] [address] [place], [place] [address] Phone: [phone] Fax: [phone]

## 2018-04-05 ENCOUNTER — Ambulatory Visit (HOSPITAL_COMMUNITY): Payer: Medicare HMO | Admitting: Psychiatry

## 2018-04-05 ENCOUNTER — Ambulatory Visit: Payer: Self-pay | Admitting: Student

## 2018-04-05 NOTE — Progress Notes (Signed)
CC'D TO PCP °

## 2018-04-09 ENCOUNTER — Other Ambulatory Visit (HOSPITAL_COMMUNITY): Payer: Self-pay | Admitting: Psychiatry

## 2018-04-09 ENCOUNTER — Telehealth (HOSPITAL_COMMUNITY): Payer: Self-pay | Admitting: *Deleted

## 2018-04-09 DIAGNOSIS — R197 Diarrhea, unspecified: Secondary | ICD-10-CM | POA: Diagnosis not present

## 2018-04-09 MED ORDER — BUPROPION HCL ER (SR) 100 MG PO TB12: 100.0000 mg | ORAL_TABLET | Freq: Every day | ORAL | 0 refills | Status: DC

## 2018-04-09 MED ORDER — METHYLPHENIDATE HCL 10 MG PO TABS: 10.0000 mg | ORAL_TABLET | Freq: Two times a day (BID) | ORAL | 0 refills | Status: DC

## 2018-04-09 MED ORDER — QUETIAPINE FUMARATE 200 MG PO TABS
200.0000 mg | ORAL_TABLET | Freq: Every day | ORAL | 0 refills | Status: DC
Start: 2018-04-09 — End: 2018-04-18

## 2018-04-09 MED ORDER — ESCITALOPRAM OXALATE 10 MG PO TABS: 10.0000 mg | ORAL_TABLET | Freq: Every day | ORAL | 0 refills | Status: DC

## 2018-04-09 NOTE — Telephone Encounter (Signed)
ordered

## 2018-04-09 NOTE — Telephone Encounter (Signed)
Dr Modesta Messing Patient called requesting refills on med's. Next appointment 04/18/2018

## 2018-04-12 ENCOUNTER — Other Ambulatory Visit: Payer: Self-pay | Admitting: Gastroenterology

## 2018-04-12 ENCOUNTER — Other Ambulatory Visit: Payer: Self-pay

## 2018-04-12 DIAGNOSIS — R197 Diarrhea, unspecified: Secondary | ICD-10-CM

## 2018-04-12 MED ORDER — DICYCLOMINE HCL 10 MG PO CAPS: 10.0000 mg | ORAL_CAPSULE | Freq: Three times a day (TID) | ORAL | 1 refills | Status: DC

## 2018-04-12 MED ORDER — PEG 3350-KCL-NA BICARB-NACL 420 G PO SOLR: 4000.0000 mL | ORAL | 0 refills | Status: DC

## 2018-04-12 NOTE — Progress Notes (Signed)
Cdiff negative, negative stool culture and Giarda. We need to arrange a colonoscopy with random colonic biopsies by Dr. Oneida Alar. Needs Propofol. I have sent in Bentyl to take before meals and at bedtime as needed. Monitor for dry mouth, constipation, drowsiness, dizziness. We have already discussed at time of appointment that a colonoscopy would be the next step if negative stool studies.

## 2018-04-12 NOTE — Progress Notes (Signed)
Pt is aware and Ok to schedule colonoscopy.

## 2018-04-13 LAB — STOOL CULTURE
MICRO NUMBER:: 79937
MICRO NUMBER:: 79938
MICRO NUMBER:: 79939
SHIGA RESULT:: NOT DETECTED
SPECIMEN QUALITY:: ADEQUATE
SPECIMEN QUALITY:: ADEQUATE
SPECIMEN QUALITY:: ADEQUATE

## 2018-04-13 LAB — C. DIFFICILE GDH AND TOXIN A/B
GDH ANTIGEN: NOT DETECTED
MICRO NUMBER:: 79801
SPECIMEN QUALITY:: ADEQUATE
TOXIN A AND B: NOT DETECTED

## 2018-04-13 LAB — GIARDIA ANTIGEN
MICRO NUMBER:: 78949
RESULT:: NOT DETECTED
SPECIMEN QUALITY:: ADEQUATE
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
## 2018-04-18 ENCOUNTER — Ambulatory Visit (INDEPENDENT_AMBULATORY_CARE_PROVIDER_SITE_OTHER): Payer: Medicare HMO | Admitting: Psychiatry

## 2018-04-18 VITALS — BP 108/68 | HR 91 | Ht 66.0 in | Wt 195.0 lb

## 2018-04-18 DIAGNOSIS — F0630 Mood disorder due to known physiological condition, unspecified: Secondary | ICD-10-CM | POA: Diagnosis not present

## 2018-04-18 DIAGNOSIS — F331 Major depressive disorder, recurrent, moderate: Secondary | ICD-10-CM | POA: Diagnosis not present

## 2018-04-18 DIAGNOSIS — F4310 Post-traumatic stress disorder, unspecified: Secondary | ICD-10-CM | POA: Diagnosis not present

## 2018-04-18 DIAGNOSIS — F909 Attention-deficit hyperactivity disorder, unspecified type: Secondary | ICD-10-CM

## 2018-04-18 MED ORDER — BUPROPION HCL ER (XL) 150 MG PO TB24: 150.0000 mg | ORAL_TABLET | Freq: Every day | ORAL | 0 refills | Status: DC

## 2018-04-18 MED ORDER — METHYLPHENIDATE HCL 10 MG PO TABS: 10.0000 mg | ORAL_TABLET | Freq: Two times a day (BID) | ORAL | 0 refills | Status: DC

## 2018-04-18 MED ORDER — CLONAZEPAM 0.5 MG PO TABS: 0.5000 mg | ORAL_TABLET | Freq: Three times a day (TID) | ORAL | 2 refills | Status: DC | PRN

## 2018-04-18 NOTE — Patient Instructions (Addendum)
1. Increase Wellbutrin 150 mg daily  2..Continue Quetiapine 200 mg at night 3.Continue Lexapro10 mg daily  4.Continue ritalin 10 mg twice a day 5. Continue clonazepam 0.5 mg three times a day for anxiety  6.Return to clinic inthree months for 15 mins

## 2018-04-18 NOTE — Progress Notes (Signed)
[name]: no oral diabetes medications the day of the procedure.

## 2018-04-19 DIAGNOSIS — M25552 Pain in left hip: Secondary | ICD-10-CM | POA: Diagnosis not present

## 2018-04-19 DIAGNOSIS — M545 Low back pain: Secondary | ICD-10-CM | POA: Diagnosis not present

## 2018-04-19 DIAGNOSIS — M6281 Muscle weakness (generalized): Secondary | ICD-10-CM | POA: Diagnosis not present

## 2018-04-19 DIAGNOSIS — M25551 Pain in right hip: Secondary | ICD-10-CM | POA: Diagnosis not present

## 2018-04-24 DIAGNOSIS — M545 Low back pain: Secondary | ICD-10-CM | POA: Diagnosis not present

## 2018-04-24 DIAGNOSIS — M25551 Pain in right hip: Secondary | ICD-10-CM | POA: Diagnosis not present

## 2018-04-24 DIAGNOSIS — M6281 Muscle weakness (generalized): Secondary | ICD-10-CM | POA: Diagnosis not present

## 2018-04-24 DIAGNOSIS — M25552 Pain in left hip: Secondary | ICD-10-CM | POA: Diagnosis not present

## 2018-04-25 ENCOUNTER — Encounter (HOSPITAL_BASED_OUTPATIENT_CLINIC_OR_DEPARTMENT_OTHER): Payer: Self-pay

## 2018-04-25 DIAGNOSIS — G4710 Hypersomnia, unspecified: Secondary | ICD-10-CM

## 2018-04-25 DIAGNOSIS — G4719 Other hypersomnia: Secondary | ICD-10-CM

## 2018-04-25 DIAGNOSIS — R0683 Snoring: Secondary | ICD-10-CM

## 2018-04-25 DIAGNOSIS — G4733 Obstructive sleep apnea (adult) (pediatric): Secondary | ICD-10-CM

## 2018-04-26 DIAGNOSIS — M25552 Pain in left hip: Secondary | ICD-10-CM | POA: Diagnosis not present

## 2018-04-26 DIAGNOSIS — M545 Low back pain: Secondary | ICD-10-CM | POA: Diagnosis not present

## 2018-04-26 DIAGNOSIS — M25551 Pain in right hip: Secondary | ICD-10-CM | POA: Diagnosis not present

## 2018-04-26 DIAGNOSIS — M6281 Muscle weakness (generalized): Secondary | ICD-10-CM | POA: Diagnosis not present

## 2018-04-27 ENCOUNTER — Ambulatory Visit: Payer: Medicare HMO

## 2018-04-27 DIAGNOSIS — M25551 Pain in right hip: Secondary | ICD-10-CM | POA: Diagnosis not present

## 2018-04-27 DIAGNOSIS — M25552 Pain in left hip: Secondary | ICD-10-CM | POA: Diagnosis not present

## 2018-04-27 DIAGNOSIS — M6281 Muscle weakness (generalized): Secondary | ICD-10-CM | POA: Diagnosis not present

## 2018-04-27 DIAGNOSIS — M545 Low back pain: Secondary | ICD-10-CM | POA: Diagnosis not present

## 2018-04-30 DIAGNOSIS — M545 Low back pain: Secondary | ICD-10-CM | POA: Diagnosis not present

## 2018-04-30 DIAGNOSIS — M25551 Pain in right hip: Secondary | ICD-10-CM | POA: Diagnosis not present

## 2018-04-30 DIAGNOSIS — M6281 Muscle weakness (generalized): Secondary | ICD-10-CM | POA: Diagnosis not present

## 2018-04-30 DIAGNOSIS — M25552 Pain in left hip: Secondary | ICD-10-CM | POA: Diagnosis not present

## 2018-05-01 ENCOUNTER — Encounter (HOSPITAL_COMMUNITY): Payer: Self-pay | Admitting: Licensed Clinical Social Worker

## 2018-05-01 ENCOUNTER — Ambulatory Visit (INDEPENDENT_AMBULATORY_CARE_PROVIDER_SITE_OTHER): Payer: Medicare HMO | Admitting: Licensed Clinical Social Worker

## 2018-05-01 DIAGNOSIS — F4310 Post-traumatic stress disorder, unspecified: Secondary | ICD-10-CM | POA: Diagnosis not present

## 2018-05-01 DIAGNOSIS — F0630 Mood disorder due to known physiological condition, unspecified: Secondary | ICD-10-CM

## 2018-05-01 NOTE — Progress Notes (Signed)
   THERAPIST PROGRESS NOTE  Session Time: 1:00 pm-1:50 pm  Participation Level: Active  Behavioral Response: CasualAlertDepressed  Type of Therapy: Individual Therapy  Treatment Goals addressed: Coping  Interventions: CBT and Solution Focused  Summary: [name] is a 58 y.o. female who presents with oriented x5 (person, place, situation, time and object), casually dressed, appropriately groomed, average height, overweight and cooperative to address mood. Patient has a history of mental health treatment including outpatient therapy and medication management. Patient has a history of medical treatment including diabetes, back pain and chronic pain. Patient denies suicidal and homicidal ideations. Patient denies psychosis including auditory and visual hallucinations. Patient denies substance abuse. Patient is at low risk for lethality at this time.   Physically: No issues identified.  Spiritually/values: No issues identified.  Relationships: Patient is having difficulty with her sister. She recognizes that her sister can be toxic. Patient enables her sisters needy behaviors such as when her sister calls her to come to the store because her sister doesn't fill like it. It is a half hour drive and her sister has several adults in the home that can drive but refuse to help. After discussion, patient understood that her sister's problems are not patient's emergency. She is not going to answer every call and is going to try to not give into her sister. Patient is going to write out "Don't make your problem, my emergency" so she can see it when she talks to her sister.  Emotionally/Mentally/Behavior: Patient is frustrated with her sister. She is drained by interacting with her. She has to interact with her at times to make decisions to help their 42 year old step father who recently had a stroke but patient wants to keep it at that. She is going to set boundaries with her sister so that she doesn't  have to cut off all communication with her sister.   Patient engaged in session. She responded well to interventions. Patient continues to meet criteria for PTSD. Patient will continue in outpatient therapy due to being the least restrictive service to meet her needs. Patient made minimal progress on her goals.   Suicidal/Homicidal: Negativewithout intent/plan  Therapist Response: Therapist reviewed patient's recent thoughts and behaviors. Therapist utilized CBT to address mood. Therapist processed patient's feelings to identify triggers for mood. Therapist discussed patient's relationship with her sister.   Plan: Return again in 4 weeks.  Diagnosis: Axis I: Post Traumatic Stress Disorder    Axis II: No diagnosis    Glori Bickers, LCSW 05/01/2018

## 2018-05-02 DIAGNOSIS — M25552 Pain in left hip: Secondary | ICD-10-CM | POA: Diagnosis not present

## 2018-05-02 DIAGNOSIS — M6281 Muscle weakness (generalized): Secondary | ICD-10-CM | POA: Diagnosis not present

## 2018-05-02 DIAGNOSIS — M25551 Pain in right hip: Secondary | ICD-10-CM | POA: Diagnosis not present

## 2018-05-02 DIAGNOSIS — M545 Low back pain: Secondary | ICD-10-CM | POA: Diagnosis not present

## 2018-05-03 ENCOUNTER — Ambulatory Visit (INDEPENDENT_AMBULATORY_CARE_PROVIDER_SITE_OTHER): Payer: Medicare HMO | Admitting: Orthotics

## 2018-05-03 DIAGNOSIS — M545 Low back pain: Secondary | ICD-10-CM | POA: Diagnosis not present

## 2018-05-03 DIAGNOSIS — M216X9 Other acquired deformities of unspecified foot: Secondary | ICD-10-CM

## 2018-05-03 DIAGNOSIS — M216X1 Other acquired deformities of right foot: Secondary | ICD-10-CM

## 2018-05-03 DIAGNOSIS — L84 Corns and callosities: Secondary | ICD-10-CM

## 2018-05-03 DIAGNOSIS — E1140 Type 2 diabetes mellitus with diabetic neuropathy, unspecified: Secondary | ICD-10-CM | POA: Diagnosis not present

## 2018-05-03 DIAGNOSIS — M47816 Spondylosis without myelopathy or radiculopathy, lumbar region: Secondary | ICD-10-CM | POA: Diagnosis not present

## 2018-05-03 DIAGNOSIS — E1149 Type 2 diabetes mellitus with other diabetic neurological complication: Secondary | ICD-10-CM | POA: Diagnosis not present

## 2018-05-03 DIAGNOSIS — Z6831 Body mass index (BMI) 31.0-31.9, adult: Secondary | ICD-10-CM | POA: Diagnosis not present

## 2018-05-03 DIAGNOSIS — M216X2 Other acquired deformities of left foot: Secondary | ICD-10-CM | POA: Diagnosis not present

## 2018-05-03 NOTE — Progress Notes (Signed)

## 2018-05-04 DIAGNOSIS — M6281 Muscle weakness (generalized): Secondary | ICD-10-CM | POA: Diagnosis not present

## 2018-05-04 DIAGNOSIS — M25551 Pain in right hip: Secondary | ICD-10-CM | POA: Diagnosis not present

## 2018-05-04 DIAGNOSIS — M545 Low back pain: Secondary | ICD-10-CM | POA: Diagnosis not present

## 2018-05-04 DIAGNOSIS — M25552 Pain in left hip: Secondary | ICD-10-CM | POA: Diagnosis not present

## 2018-05-15 DIAGNOSIS — M6281 Muscle weakness (generalized): Secondary | ICD-10-CM | POA: Diagnosis not present

## 2018-05-15 DIAGNOSIS — M25552 Pain in left hip: Secondary | ICD-10-CM | POA: Diagnosis not present

## 2018-05-15 DIAGNOSIS — M25551 Pain in right hip: Secondary | ICD-10-CM | POA: Diagnosis not present

## 2018-05-15 DIAGNOSIS — M545 Low back pain: Secondary | ICD-10-CM | POA: Diagnosis not present

## 2018-05-16 ENCOUNTER — Encounter (HOSPITAL_COMMUNITY): Payer: Self-pay | Admitting: Licensed Clinical Social Worker

## 2018-05-16 ENCOUNTER — Ambulatory Visit (INDEPENDENT_AMBULATORY_CARE_PROVIDER_SITE_OTHER): Payer: Medicare HMO | Admitting: Licensed Clinical Social Worker

## 2018-05-16 DIAGNOSIS — F0630 Mood disorder due to known physiological condition, unspecified: Secondary | ICD-10-CM | POA: Diagnosis not present

## 2018-05-16 DIAGNOSIS — F4310 Post-traumatic stress disorder, unspecified: Secondary | ICD-10-CM | POA: Diagnosis not present

## 2018-05-16 NOTE — Progress Notes (Signed)
   THERAPIST PROGRESS NOTE  Session Time: 9:00 am-9:50 am  Participation Level: Active  Behavioral Response: CasualAlertDepressed  Type of Therapy: Individual Therapy  Treatment Goals addressed: Coping  Interventions: CBT and Solution Focused  Summary: [name] is a 58 y.o. female who presents with oriented x5 (person, place, situation, time and object), casually dressed, appropriately groomed, average height, overweight and cooperative to address mood. Patient has a history of mental health treatment including outpatient therapy and medication management. Patient has a history of medical treatment including diabetes, back pain and chronic pain. Patient denies suicidal and homicidal ideations. Patient denies psychosis including auditory and visual hallucinations. Patient denies substance abuse. Patient is at low risk for lethality at this time.   Physically: No issues identified.  Spiritually/values: No issues identified.  Relationships: Patient continues to have difficulty with her sister. She has explained to her sister that she is done "trying to live your life for you" and is trying to limit how often they speak on the phone. Patient said that her sister calls and asks her the same thing about how to deal with a family member with addiction but her sister never takes the advice. Patient's son is getting out of jail in March. He is going to stay with her temporarily. She is hesitant about having him there because he has a history of addiction. Patient is going to hide her pain medication while he is staying there. She said that he understands he is only staying there temporarily and he has plans to move in with his girlfriend, and has a job lined up. Patient understood that she needs to protect herself and her son by hiding her medication.  She is not sure if her son or the addict in him will be showing up once he is released from jail.  Emotionally/Mentally/Behavior: Patient is  managing but feels overwhelmed by everything that is occurring.   Patient engaged in session. She responded well to interventions. Patient continues to meet criteria for PTSD. Patient will continue in outpatient therapy due to being the least restrictive service to meet her needs. Patient made minimal progress on her goals.   Suicidal/Homicidal: Negativewithout intent/plan  Therapist Response: Therapist reviewed patient's recent thoughts and behaviors. Therapist utilized CBT to address mood. Therapist processed patient's feelings to identify triggers for mood. Therapist discussed patient's relationship with her sister and setting boundaries with her son.  Plan: Return again in 4 weeks.  Diagnosis: Axis I: Post Traumatic Stress Disorder and Mood disorder in conditions classified elsewhere    Axis II: No diagnosis    Glori Bickers, LCSW 05/16/2018

## 2018-05-17 ENCOUNTER — Encounter

## 2018-05-17 ENCOUNTER — Ambulatory Visit (HOSPITAL_BASED_OUTPATIENT_CLINIC_OR_DEPARTMENT_OTHER): Payer: Medicare HMO | Attending: Internal Medicine | Admitting: Internal Medicine

## 2018-05-17 VITALS — Ht 66.0 in | Wt 195.0 lb

## 2018-05-17 DIAGNOSIS — G4719 Other hypersomnia: Secondary | ICD-10-CM

## 2018-05-17 DIAGNOSIS — G4710 Hypersomnia, unspecified: Secondary | ICD-10-CM

## 2018-05-17 DIAGNOSIS — G4733 Obstructive sleep apnea (adult) (pediatric): Secondary | ICD-10-CM | POA: Diagnosis not present

## 2018-05-17 DIAGNOSIS — R0683 Snoring: Secondary | ICD-10-CM

## 2018-05-19 DIAGNOSIS — R0683 Snoring: Secondary | ICD-10-CM | POA: Diagnosis not present

## 2018-05-19 NOTE — Procedures (Signed)
Patient Name: [name], [name] Date: 05/17/2018 Gender: Female D.O.B: 02-May-1960 Age (years): 57 Referring Provider: Asencion Noble Height (inches): 60 Interpreting Physician: Baird Lyons MD, ABSM Weight (lbs): 195 RPSGT: Baxter Flattery BMI: 31 MRN: [id number] Neck Size: 17.00  CLINICAL INFORMATION Sleep Study Type: NPSG Indication for sleep study: Diabetes, Fatigue, Snoring, Witnesses Apnea / Gasping During Sleep Epworth Sleepiness Score: 17  SLEEP STUDY TECHNIQUE As per the AASM Manual for the Scoring of Sleep and Associated Events v2.3 (April 2016) with a hypopnea requiring 4% desaturations.  The channels recorded and monitored were frontal, central and occipital EEG, electrooculogram (EOG), submentalis EMG (chin), nasal and oral airflow, thoracic and abdominal wall motion, anterior tibialis EMG, snore microphone, electrocardiogram, and pulse oximetry.  MEDICATIONS Medications self-administered by patient taken the night of the study : METFORMIN, ROBAXIN, SIMVASTATIN, GABAPENTIN, [place] The study was initiated at 10:18:11 PM and ended at 5:10:58 AM.  Sleep onset time was 21.4 minutes and the sleep efficiency was 85.5%%. The total sleep time was 352.9 minutes.  Stage REM latency was 226.0 minutes.  The patient spent 4.5%% of the night in stage N1 sleep, 84.0%% in stage N2 sleep, 0.0%% in stage N3 and 11.5% in REM.  Alpha intrusion was absent.  Supine sleep was 48.73%.  RESPIRATORY PARAMETERS The overall apnea/hypopnea index (AHI) was 6.6 per hour. There were 10 total apneas, including 10 obstructive, 0 central and 0 mixed apneas. There were 29 hypopneas and 4 RERAs.  The AHI during Stage REM sleep was 4.4 per hour.  AHI while supine was 12.2 per hour.  The mean oxygen saturation was 92.5%. The minimum SpO2 during sleep was 87.0%.  loud snoring was noted during this study.  CARDIAC DATA The 2 lead EKG demonstrated sinus rhythm. The mean  heart rate was 47.0 beats per minute. Other EKG findings include: None.  LEG MOVEMENT DATA The total PLMS were 0 with a resulting PLMS index of 0.0. Associated arousal with leg movement index was 0.0 .  IMPRESSIONS - Mild obstructive sleep apnea occurred during this study (AHI = 6.6/h). - Insufficient early events to meet protocol reqirements for split CPAP titration. - No significant central sleep apnea occurred during this study (CAI = 0.0/h). - Mild oxygen desaturation was noted during this study (Min O2 = 87.0%). Mean sat 92.5%. - The patient snored with loud snoring volume. - No cardiac abnormalities were noted during this study. - Clinically significant periodic limb movements did not occur during sleep. No significant associated arousals.  DIAGNOSIS - Obstructive Sleep Apnea (327.23 [G47.33 ICD-10])  RECOMMENDATIONS - Treatment for mild OSA is directed at symptoms, with consideration of co-morbidities. Conservative measures may include observation, weight loss and sleep position off back. - Other options, including consultation, CPAP, fitted oral appliance or ENT evaluation, would be based on clinical judgment. - Be careful with alcohol, sedatives and other CNS depressants that may worsen sleep apnea and disrupt normal sleep architecture. - Sleep hygiene should be reviewed to assess factors that may improve sleep quality. - Weight management and regular exercise should be initiated or continued if appropriate.  [Electronically signed] 05/19/2018 12:48 PM  Baird Lyons MD, [place], American Board of Sleep Medicine   NPI: [id number]                          [place]  ELECTRONICALLY SIGNED ON:  05/19/2018, 12:42 PM [place]  PH: [phone]   FX: [phone] [place]

## 2018-05-23 ENCOUNTER — Telehealth (HOSPITAL_COMMUNITY): Payer: Self-pay

## 2018-05-23 ENCOUNTER — Other Ambulatory Visit (HOSPITAL_COMMUNITY): Payer: Self-pay | Admitting: Psychiatry

## 2018-05-23 ENCOUNTER — Ambulatory Visit (HOSPITAL_COMMUNITY): Payer: Medicare HMO | Admitting: Licensed Clinical Social Worker

## 2018-05-23 MED ORDER — BUPROPION HCL ER (SR) 100 MG PO TB12: 100.0000 mg | ORAL_TABLET | Freq: Two times a day (BID) | ORAL | 0 refills | Status: DC

## 2018-05-23 NOTE — Telephone Encounter (Signed)
Called patient informed her of the message. She understood

## 2018-05-23 NOTE — Telephone Encounter (Signed)
Ordered bupropion 100 mg twice a day. Please advise the patient to discontinue bupropion XR (NOT to take both extended release and immediate release.)

## 2018-05-23 NOTE — Telephone Encounter (Signed)
This patient called and said that since her medication Bupropion was changed to Bupropion XL 150 it's not working for her she said she is more depressed.  Patient said that the expended release doesn't work very well for her, it never has. She would like to be put back on Bupropion 100mg  not the extended release.

## 2018-05-28 DIAGNOSIS — M542 Cervicalgia: Secondary | ICD-10-CM | POA: Diagnosis not present

## 2018-06-05 DIAGNOSIS — E1140 Type 2 diabetes mellitus with diabetic neuropathy, unspecified: Secondary | ICD-10-CM | POA: Diagnosis not present

## 2018-06-07 ENCOUNTER — Telehealth: Payer: Self-pay | Admitting: Gastroenterology

## 2018-06-07 NOTE — Telephone Encounter (Signed)
Called patient and advised her for right now she is still on for procedure. She voiced understanding

## 2018-06-07 NOTE — Telephone Encounter (Signed)
[place], SHE WANTS TO KNOW IF SHE SHOULD RESCHEDULE HER PROCEDURE

## 2018-06-11 DIAGNOSIS — M1732 Unilateral post-traumatic osteoarthritis, left knee: Secondary | ICD-10-CM | POA: Diagnosis not present

## 2018-06-11 DIAGNOSIS — M25562 Pain in left knee: Secondary | ICD-10-CM | POA: Diagnosis not present

## 2018-06-12 DIAGNOSIS — E1140 Type 2 diabetes mellitus with diabetic neuropathy, unspecified: Secondary | ICD-10-CM | POA: Diagnosis not present

## 2018-06-12 DIAGNOSIS — Z720 Tobacco use: Secondary | ICD-10-CM | POA: Diagnosis not present

## 2018-06-19 ENCOUNTER — Encounter (HOSPITAL_COMMUNITY): Payer: Self-pay

## 2018-06-19 NOTE — Patient Instructions (Addendum)
[name]  06/19/2018     @PREFPERIOPPHARMACY @   Your procedure is scheduled on 06/26/2018.  Report to [facility] at 6:45 A.M.  Call this number if you have problems the morning of surgery:  [phone]   Remember: Follow the instructions given to you by Dr. Oneida Alar office  Do not eat or drink after midnight.    Take these medicines the morning of surgery with A SIP OF WATER  Wellbutrin, Klonopin,Neurontin, Synthroid and Protonix    Do not wear jewelry, make-up or nail polish.  Do not wear lotions, powders, or perfumes, or deodorant.  Do not shave 48 hours prior to surgery.  Men may shave face and neck.  Do not bring valuables to the hospital.  [facility] is not responsible for any belongings or valuables.  Contacts, dentures or bridgework may not be worn into surgery.  Leave your suitcase in the car.  After surgery it may be brought to your room.  For patients admitted to the hospital, discharge time will be determined by your treatment team.   Colonoscopy, Adult A colonoscopy is an exam to look at the entire large intestine. During the exam, a lubricated, flexible tube that has a camera on the end of it is inserted into the anus and then passed into the rectum, colon, and other parts of the large intestine. You may have a colonoscopy as a part of normal colorectal screening or if you have certain symptoms, such as:  Lack of red blood cells (anemia).  Diarrhea that does not go away.  Abdominal pain.  Blood in your stool (feces). A colonoscopy can help screen for and diagnose medical problems, including:  Tumors.  Polyps.  Inflammation.  Areas of bleeding. Tell a health care provider about:  Any allergies you have.  All medicines you are taking, including vitamins, herbs, eye drops, creams, and over-the-counter medicines.  Any problems you or family members have had with anesthetic medicines.  Any blood disorders you have.  Any surgeries you have had.   Any medical conditions you have.  Any problems you have had passing stool. What are the risks? Generally, this is a safe procedure. However, problems may occur, including:  Bleeding.  A tear in the intestine.  A reaction to medicines given during the exam.  Infection (rare). What happens before the procedure? Eating and drinking restrictions Follow instructions from your health care provider about eating and drinking, which may include:  A few days before the procedure - follow a low-fiber diet. Avoid nuts, seeds, dried fruit, raw fruits, and vegetables.  1-3 days before the procedure - follow a clear liquid diet. Drink only clear liquids, such as clear broth or bouillon, black coffee or tea, clear juice, clear soft drinks or sports drinks, gelatin dessert, and popsicles. Avoid any liquids that contain red or purple dye.  On the day of the procedure - do not eat or drink anything starting 2 hours before the procedure, or within the time period that your health care provider recommends. Up to 2 hours before the procedure, you may continue to drink clear liquids, such as water or clear fruit juice. Bowel prep If you were prescribed an oral bowel prep to clean out your colon:  Take it as told by your health care provider. Starting the day before your procedure, you will need to drink a large amount of medicated liquid. The liquid will cause you to have multiple loose stools until your stool is almost clear  or light green.  If your skin or anus gets irritated from diarrhea, you may use these to relieve the irritation: ? Medicated wipes, such as adult wet wipes with aloe and vitamin E. ? A skin-soothing product like petroleum jelly.  If you vomit while drinking the bowel prep, take a break for up to 60 minutes and then begin the bowel prep again. If vomiting continues and you cannot take the bowel prep without vomiting, call your health care provider.  To clean out your colon, you may  also be given: ? Laxative medicines. ? Instructions about how to use an enema. General instructions  Ask your health care provider about: ? Changing or stopping your regular medicines or supplements. This is especially important if you are taking iron supplements, diabetes medicines, or blood thinners. ? Taking medicines such as aspirin and ibuprofen. These medicines can thin your blood. Do not take these medicines before the procedure if your health care provider tells you not to.  Plan to have someone take you home from the hospital or clinic. What happens during the procedure?   An IV may be inserted into one of your veins.  You will be given medicine to help you relax (sedative).  To reduce your risk of infection: ? Your health care team will wash or sanitize their hands. ? Your anal area will be washed with soap.  You will be asked to lie on your side with your knees bent.  Your health care provider will lubricate a long, thin, flexible tube. The tube will have a camera and a light on the end.  The tube will be inserted into your anus.  The tube will be gently eased through your rectum and colon.  Air will be delivered into your colon to keep it open. You may feel some pressure or cramping.  The camera will be used to take images during the procedure.  A small tissue sample may be removed to be examined under a microscope (biopsy).  If small polyps are found, your health care provider may remove them and have them checked for cancer cells.  When the exam is done, the tube will be removed. The procedure may vary among health care providers and hospitals. What happens after the procedure?  Your blood pressure, heart rate, breathing rate, and blood oxygen level will be monitored until the medicines you were given have worn off.  Do not drive for 24 hours after the exam.  You may have a small amount of blood in your stool.  You may pass gas and have mild abdominal  cramping or bloating due to the air that was used to inflate your colon during the exam.  It is up to you to get the results of your procedure. Ask your health care provider, or the department performing the procedure, when your results will be ready. Summary  A colonoscopy is an exam to look at the entire large intestine.  During a colonoscopy, a lubricated, flexible tube with a camera on the end of it is inserted into the anus and then passed into the colon and other parts of the large intestine.  Follow instructions from your health care provider about eating and drinking before the procedure.  If you were prescribed an oral bowel prep to clean out your colon, take it as told by your health care provider.  After your procedure, your blood pressure, heart rate, breathing rate, and blood oxygen level will be monitored until the medicines you were  given have worn off. This information is not intended to replace advice given to you by your health care provider. Make sure you discuss any questions you have with your health care provider. Document Released: 03/04/2000 Document Revised: 12/28/2016 Document Reviewed: 05/19/2015 Elsevier Interactive Patient Education  2019 [employer].   Patients discharged the day of surgery will not be allowed to drive home.   Name and phone number of your driver:   family Special instructions: Care and Recovery After Surgery

## 2018-06-20 ENCOUNTER — Encounter (HOSPITAL_COMMUNITY)
Admission: RE | Admit: 2018-06-20 | Discharge: 2018-06-20 | Disposition: A | Discharge status: Home / Self Care | Payer: Medicare HMO | Source: Ambulatory Visit | Attending: Gastroenterology | Admitting: Gastroenterology

## 2018-06-20 ENCOUNTER — Encounter (HOSPITAL_COMMUNITY): Payer: Self-pay | Admitting: Anesthesiology

## 2018-06-20 ENCOUNTER — Other Ambulatory Visit: Payer: Self-pay

## 2018-06-20 ENCOUNTER — Encounter (HOSPITAL_COMMUNITY): Payer: Self-pay

## 2018-06-20 DIAGNOSIS — Z01812 Encounter for preprocedural laboratory examination: Secondary | ICD-10-CM | POA: Insufficient documentation

## 2018-06-20 LAB — BASIC METABOLIC PANEL
Anion gap: 9 (ref 5–15)
BUN: 19 mg/dL (ref 6–20)
CO2: 25 mmol/L (ref 22–32)
Calcium: 9.1 mg/dL (ref 8.9–10.3)
Chloride: 101 mmol/L (ref 98–111)
Creatinine, Ser: 0.86 mg/dL (ref 0.44–1.00)
GFR calc Af Amer: >60 mL/min (ref >60)
GFR calc non Af Amer: >60 mL/min (ref >60)
Glucose, Bld: 159 mg/dL — ABNORMAL HIGH (ref 70–99)
Potassium: 4.2 mmol/L (ref 3.5–5.1)
Sodium: 135 mmol/L (ref 135–145)

## 2018-06-22 ENCOUNTER — Encounter (HOSPITAL_COMMUNITY): Payer: Self-pay | Admitting: Gastroenterology

## 2018-06-22 ENCOUNTER — Telehealth: Payer: Self-pay | Admitting: Gastroenterology

## 2018-06-22 NOTE — Telephone Encounter (Signed)
Called patient TO DISCUSS SYMPTOMS. MAY HAVE LOOSE AND WATERY & STOOLS. ALSO HAS PROBLEMS WITH CONSTIPATION. SYMPTOMS FOR ONE YEAR. MORE TROUBLE WITH CONSTIPATION: BMs 1-5 BUT MAY BE NO BM FOR 3 DAYS.  TAKES OXY 3/DAY ON SYMPROIC.  RECOMMENDATIONS: 1. MINIMIZE DAIRY AND FAT. 2. USE MOM IF NO BM FOR 24 HRS. 3, BENTYL PRN.  PT IS OK TO WAITING FOR 4-6 WEEKS DUE TO COVID 19 OUTBREAK.  [place] TCS IN 4-6 WEEKS.

## 2018-06-22 NOTE — Telephone Encounter (Signed)
Routing to clinical pool to reschedule

## 2018-06-25 NOTE — Telephone Encounter (Signed)
Spoke with patient and she states will call me back to r/s as she is driving

## 2018-06-25 NOTE — Telephone Encounter (Signed)
Called pt and she is r/s'd to 5/26 at 8:45am. Patient aware new instructions/pre-op will be mailed.

## 2018-06-27 DIAGNOSIS — M47816 Spondylosis without myelopathy or radiculopathy, lumbar region: Secondary | ICD-10-CM | POA: Diagnosis not present

## 2018-06-27 DIAGNOSIS — M48061 Spinal stenosis, lumbar region without neurogenic claudication: Secondary | ICD-10-CM | POA: Diagnosis not present

## 2018-06-27 DIAGNOSIS — M545 Low back pain: Secondary | ICD-10-CM | POA: Diagnosis not present

## 2018-07-03 DIAGNOSIS — J309 Allergic rhinitis, unspecified: Secondary | ICD-10-CM | POA: Diagnosis not present

## 2018-07-10 ENCOUNTER — Other Ambulatory Visit (HOSPITAL_COMMUNITY): Payer: Self-pay | Admitting: Psychiatry

## 2018-07-10 ENCOUNTER — Telehealth (HOSPITAL_COMMUNITY): Payer: Self-pay | Admitting: *Deleted

## 2018-07-10 MED ORDER — CLONAZEPAM 0.5 MG PO TABS: 0.5000 mg | ORAL_TABLET | Freq: Three times a day (TID) | ORAL | 0 refills | Status: DC | PRN

## 2018-07-10 MED ORDER — METHYLPHENIDATE HCL 10 MG PO TABS: 10.0000 mg | ORAL_TABLET | Freq: Two times a day (BID) | ORAL | 0 refills | Status: DC

## 2018-07-10 NOTE — Telephone Encounter (Signed)
ordered

## 2018-07-10 NOTE — Telephone Encounter (Signed)
Per Rx patient has 0 refills on Klonopin & Ritalin

## 2018-07-10 NOTE — Telephone Encounter (Signed)
Dr Modesta Messing Patient called requesting refills Next visit 07-18-2018
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
## 2018-07-18 ENCOUNTER — Other Ambulatory Visit: Payer: Self-pay

## 2018-07-18 ENCOUNTER — Ambulatory Visit (INDEPENDENT_AMBULATORY_CARE_PROVIDER_SITE_OTHER): Payer: Medicare HMO | Admitting: Psychiatry

## 2018-07-18 ENCOUNTER — Encounter (HOSPITAL_COMMUNITY): Payer: Self-pay | Admitting: Psychiatry

## 2018-07-18 DIAGNOSIS — F330 Major depressive disorder, recurrent, mild: Secondary | ICD-10-CM | POA: Diagnosis not present

## 2018-07-18 DIAGNOSIS — F4310 Post-traumatic stress disorder, unspecified: Secondary | ICD-10-CM | POA: Diagnosis not present

## 2018-07-18 MED ORDER — METHYLPHENIDATE HCL 10 MG PO TABS: 10.0000 mg | ORAL_TABLET | Freq: Two times a day (BID) | ORAL | 0 refills | Status: DC

## 2018-07-18 MED ORDER — CLONAZEPAM 0.5 MG PO TABS: 0.5000 mg | ORAL_TABLET | Freq: Three times a day (TID) | ORAL | 1 refills | Status: DC | PRN

## 2018-07-18 MED ORDER — ESCITALOPRAM OXALATE 10 MG PO TABS: 10.0000 mg | ORAL_TABLET | Freq: Every day | ORAL | 0 refills | Status: DC

## 2018-07-18 MED ORDER — BUPROPION HCL ER (SR) 100 MG PO TB12: 100.0000 mg | ORAL_TABLET | Freq: Two times a day (BID) | ORAL | 0 refills | Status: DC

## 2018-07-18 MED ORDER — QUETIAPINE FUMARATE 200 MG PO TABS: 200.0000 mg | ORAL_TABLET | Freq: Every day | ORAL | 0 refills | Status: DC

## 2018-07-18 NOTE — Patient Instructions (Signed)
1. Continue bupropion 100 mg BID (she prefers SR) 2..ContinueQuetiapine200mg  at night 3.Continue Lexapro10 mg daily  4.Continueritalin 10 mg twice a day 5. Continue clonazepam 0.5 mg three times a day for anxiety- she is advised to try taking less if able 6.Next appointment 7/21 at 9 AM for 20 mins 7. Front desk to contact for therapy follow up

## 2018-07-30 DIAGNOSIS — M47816 Spondylosis without myelopathy or radiculopathy, lumbar region: Secondary | ICD-10-CM | POA: Diagnosis not present

## 2018-08-07 ENCOUNTER — Telehealth: Payer: Self-pay | Admitting: *Deleted

## 2018-08-07 NOTE — Telephone Encounter (Signed)
Called pt, informed her we would contact her to reschedule TCS w/Propofol w/[facility] when August schedule is available. Pt agreeable. LMOVM for endo scheduler.  FYI to AB.

## 2018-08-07 NOTE — Telephone Encounter (Signed)
Pt called and would like to re-schedule her TCS w/ Propofol on 08/14/2018 with Dr. Oneida Alar further out.  Pt would like you to call her back at [phone] when you get a chance.

## 2018-08-08 ENCOUNTER — Encounter (HOSPITAL_COMMUNITY)
Admission: RE | Admit: 2018-08-08 | Discharge: 2018-08-08 | Disposition: A | Discharge status: Home / Self Care | Payer: Medicare HMO | Source: Ambulatory Visit | Attending: Gastroenterology | Admitting: Gastroenterology

## 2018-08-08 ENCOUNTER — Other Ambulatory Visit: Payer: Self-pay

## 2018-08-14 ENCOUNTER — Ambulatory Visit (HOSPITAL_COMMUNITY): Admission: RE | Admit: 2018-08-14 | Payer: Medicare HMO | Source: Home / Self Care | Admitting: Gastroenterology

## 2018-08-14 ENCOUNTER — Encounter (HOSPITAL_COMMUNITY): Admission: RE | Payer: Self-pay | Source: Home / Self Care

## 2018-08-14 SURGERY — COLONOSCOPY WITH PROPOFOL
Anesthesia: Monitor Anesthesia Care

## 2018-08-16 ENCOUNTER — Telehealth: Payer: Self-pay | Admitting: *Deleted

## 2018-08-16 NOTE — Telephone Encounter (Signed)
LMOVM for pt to call back to schedule TCS with propofol with [facility]

## 2018-08-20 NOTE — Telephone Encounter (Signed)
Letter mailed to call back to schedule

## 2018-08-22 ENCOUNTER — Telehealth: Payer: Self-pay | Admitting: Gastroenterology

## 2018-08-22 ENCOUNTER — Other Ambulatory Visit: Payer: Self-pay

## 2018-08-22 DIAGNOSIS — R197 Diarrhea, unspecified: Secondary | ICD-10-CM

## 2018-08-22 NOTE — Telephone Encounter (Signed)
LMOVM

## 2018-08-22 NOTE — Telephone Encounter (Signed)
Duplicate message. 

## 2018-08-22 NOTE — Telephone Encounter (Signed)
Pre-op appt 11/23/18 at 12:45pm. Letter mailed with procedure instructions.

## 2018-08-22 NOTE — Telephone Encounter (Signed)
[phone] patient wants to schedule her procedure

## 2018-08-22 NOTE — Telephone Encounter (Signed)
Pt called office to reschedule procedure. Called pt back, TCS rescheduled to 11/29/18 at 8:30am. She already has prep. Orders entered.

## 2018-08-22 NOTE — Telephone Encounter (Signed)
Pt was returning call about rescheduling procedure. [phone]

## 2018-08-27 ENCOUNTER — Other Ambulatory Visit: Payer: Self-pay

## 2018-08-27 ENCOUNTER — Telehealth: Payer: Self-pay | Admitting: *Deleted

## 2018-08-27 DIAGNOSIS — H1131 Conjunctival hemorrhage, right eye: Secondary | ICD-10-CM | POA: Diagnosis not present

## 2018-08-27 DIAGNOSIS — Z20822 Contact with and (suspected) exposure to covid-19: Secondary | ICD-10-CM

## 2018-08-27 NOTE — Telephone Encounter (Signed)
Dr Willey Blade is requesting testing:  Patient is Symptomatic: coughing, feverish for 1 week

## 2018-08-30 LAB — NOVEL CORONAVIRUS, NAA: SARS-CoV-2, NAA: NOT DETECTED
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
## 2018-09-12 ENCOUNTER — Ambulatory Visit (INDEPENDENT_AMBULATORY_CARE_PROVIDER_SITE_OTHER): Payer: Medicare HMO | Admitting: Psychiatry

## 2018-09-12 ENCOUNTER — Other Ambulatory Visit: Payer: Self-pay

## 2018-09-12 ENCOUNTER — Encounter (HOSPITAL_COMMUNITY): Payer: Self-pay | Admitting: Psychiatry

## 2018-09-12 DIAGNOSIS — F330 Major depressive disorder, recurrent, mild: Secondary | ICD-10-CM

## 2018-09-12 DIAGNOSIS — F319 Bipolar disorder, unspecified: Secondary | ICD-10-CM | POA: Diagnosis not present

## 2018-09-12 DIAGNOSIS — F4310 Post-traumatic stress disorder, unspecified: Secondary | ICD-10-CM | POA: Diagnosis not present

## 2018-09-12 DIAGNOSIS — E1140 Type 2 diabetes mellitus with diabetic neuropathy, unspecified: Secondary | ICD-10-CM | POA: Diagnosis not present

## 2018-09-12 MED ORDER — BUPROPION HCL ER (SR) 100 MG PO TB12: 100.0000 mg | ORAL_TABLET | Freq: Two times a day (BID) | ORAL | 0 refills | Status: DC

## 2018-09-12 MED ORDER — CLONAZEPAM 0.5 MG PO TABS: 0.5000 mg | ORAL_TABLET | Freq: Three times a day (TID) | ORAL | 1 refills | Status: DC | PRN

## 2018-09-12 MED ORDER — ESCITALOPRAM OXALATE 20 MG PO TABS: 20.0000 mg | ORAL_TABLET | Freq: Every day | ORAL | 0 refills | Status: DC

## 2018-09-12 MED ORDER — QUETIAPINE FUMARATE 200 MG PO TABS: 200.0000 mg | ORAL_TABLET | Freq: Every day | ORAL | 0 refills | Status: DC

## 2018-09-12 MED ORDER — METHYLPHENIDATE HCL 10 MG PO TABS: 10.0000 mg | ORAL_TABLET | Freq: Two times a day (BID) | ORAL | 0 refills | Status: DC

## 2018-09-12 NOTE — Patient Instructions (Signed)
1. Increase lexapro 20 mg daily 2. Continue bupropion 100 mg BID  3.ContinueQuetiapine200mg  at night 4. Continueritalin 10 mg twice a day 5. Continue clonazepam 0.5 mg three times a day for anxiety 6.Next appointment : 8/18 at 8:40

## 2018-09-18 ENCOUNTER — Telehealth: Payer: Self-pay | Admitting: Neurology

## 2018-09-18 ENCOUNTER — Encounter: Payer: Self-pay | Admitting: *Deleted

## 2018-09-18 ENCOUNTER — Ambulatory Visit (INDEPENDENT_AMBULATORY_CARE_PROVIDER_SITE_OTHER): Payer: Medicare HMO | Admitting: Neurology

## 2018-09-18 ENCOUNTER — Other Ambulatory Visit: Payer: Self-pay

## 2018-09-18 ENCOUNTER — Encounter: Payer: Self-pay | Admitting: Neurology

## 2018-09-18 DIAGNOSIS — G6289 Other specified polyneuropathies: Secondary | ICD-10-CM

## 2018-09-18 DIAGNOSIS — G629 Polyneuropathy, unspecified: Secondary | ICD-10-CM | POA: Insufficient documentation

## 2018-09-18 DIAGNOSIS — E1140 Type 2 diabetes mellitus with diabetic neuropathy, unspecified: Secondary | ICD-10-CM | POA: Diagnosis not present

## 2018-09-18 MED ORDER — PREGABALIN 100 MG PO CAPS: 100.0000 mg | ORAL_CAPSULE | Freq: Three times a day (TID) | ORAL | 5 refills | Status: DC

## 2018-09-18 NOTE — Telephone Encounter (Signed)
Pt gave consent for VV on the phone/ Pt understands that although there may be some limitations with this type of visit, we will take all precautions to reduce any security or privacy concerns.  Pt understands that this will be treated like an in office visit and we will file with pt's insurance, and there may be a patient responsible charge related to this service. Sent email with link to   [email or web address]

## 2018-09-18 NOTE — Progress Notes (Addendum)
PATIENT: [name] DOB: [birth year]  Virtual Visit via video  I connected with [place] on 09/18/18 at  by video and verified that I am speaking with the correct person using two identifiers.   I discussed the limitations, risks, security and privacy concerns of performing an evaluation and management service by video and the availability of in person appointments. I also discussed with the patient that there may be a patient responsible charge related to this service. The patient expressed understanding and agreed to proceed.  HISTORICAL  [name] is a 58 year old female, seen in request by her primary care physician Dr. Asencion Hawkins, for evaluation of peripheral neuropathy, initial evaluation was through virtual visit on September 18, 2018.  I have reviewed and summarized the referring note from the referring physician.  She had past medical history of hypothyroidism, diabetes since 2016  She presented with bilateral feet paresthesia since 2019, initially involving her toes, and top of her feet, is getting worse with ambulating, now his work is way up to ankle levels now, also began to have fingertips paresthesia,  She had a history of knee replacement, denies significant gait abnormality, no bowel and bladder incontinence,  Previously she was treated with gabapentin, which is no longer helpful, recently switched to Lyrica 75 mg twice a day, which was helpful, she denies significant side effect  She also complains of chronic neck, low back pain, radiating pain to left buttock, left lower extremity, radiating pain to bilateral shoulder and upper extremity.   Observations/Objective: I have reviewed problem lists, medications, allergies.   Awake, alert, oriented to history taking, and casual conversation  Assessment and Plan: Bilateral feet paresthesia  Most consistent with diabetic peripheral neuropathy  EMG nerve conduction study  Increase Lyrica to 100mg  3 times a  day  Follow Up Instructions:     I discussed the assessment and treatment plan with the patient. The patient was provided an opportunity to ask questions and all were answered. The patient agreed with the plan and demonstrated an understanding of the instructions.   The patient was advised to call back or seek an in-person evaluation if the symptoms worsen or if the condition fails to improve as anticipated.  I provided 30 minutes of non-face-to-face time during this encounter.  REVIEW OF SYSTEMS: Full 14 system review of systems performed and notable only for as above All other review of systems were negative.  ALLERGIES: No Known Allergies  HOME MEDICATIONS: Current Outpatient Medications  Medication Sig Dispense Refill  . amoxicillin (AMOXIL) 500 MG capsule Take 2,000 mg by mouth See admin instructions. Take 2000 mg by mouth 1 hour prior to dental appointment    . aspirin EC 81 MG tablet Take 81 mg by mouth daily.     Derrill Memo ON 10/16/2018] buPROPion (WELLBUTRIN SR) 100 MG 12 hr tablet Take 1 tablet (100 mg total) by mouth 2 (two) times daily. 180 tablet 0  . canagliflozin (INVOKANA) 300 MG TABS tablet Take 300 mg by mouth daily. 30 tablet   . clonazePAM (KLONOPIN) 0.5 MG tablet Take 1 tablet (0.5 mg total) by mouth 3 (three) times daily as needed for anxiety. 90 tablet 1  . diclofenac sodium (VOLTAREN) 1 % GEL Apply 2 g topically 4 (four) times daily as needed (joint pain).    . Dulaglutide (TRULICITY) 1.5 VX/7.9TJ SOPN Inject 0.5 mLs into the skin every Saturday.     . escitalopram (LEXAPRO) 20 MG tablet Take 1 tablet (20  mg total) by mouth daily. 90 tablet 0  . fluticasone (FLONASE) 50 MCG/ACT nasal spray Place 1 spray into both nostrils as needed.    Marland Kitchen glipiZIDE (GLUCOTROL XL) 5 MG 24 hr tablet Take 5 mg by mouth daily with breakfast.    . ibuprofen (IBU) 800 MG tablet Take 800 mg by mouth every 8 (eight) hours as needed for moderate pain.     Marland Kitchen levocetirizine (XYZAL) 5 MG  tablet Take 5 mg by mouth daily.    Marland Kitchen levothyroxine (SYNTHROID, LEVOTHROID) 150 MCG tablet Take 150 mcg by mouth daily before breakfast.    . LINZESS 145 MCG CAPS capsule Take 1 capsule by mouth daily.    . metFORMIN (GLUCOPHAGE-XR) 500 MG 24 hr tablet Take 500 mg by mouth 4 (four) times daily.     . methocarbamol (ROBAXIN) 500 MG tablet Take 1 tablet (500 mg total) by mouth 4 (four) times daily. (Patient taking differently: Take 500 mg by mouth 2 (two) times daily. ) 90 tablet 5  . [START ON 10/12/2018] methylphenidate (RITALIN) 10 MG tablet Take 1 tablet (10 mg total) by mouth 2 (two) times daily with breakfast and lunch for 30 days. 60 tablet 0  . Oxycodone HCl 10 MG TABS Take 10 mg by mouth 3 (three) times daily as needed (for pain).     . pantoprazole (PROTONIX) 40 MG tablet Take 40 mg by mouth daily before breakfast.    . pioglitazone (ACTOS) 15 MG tablet Take 15 mg by mouth daily.    . polyethylene glycol-electrolytes (TRILYTE) 420 g solution Take 4,000 mLs by mouth as directed. 4000 mL 0  . pregabalin (LYRICA) 75 MG capsule Take 1 capsule by mouth 2 (two) times a day.    Derrill Memo ON 10/17/2018] QUEtiapine (SEROQUEL) 200 MG tablet Take 1 tablet (200 mg total) by mouth at bedtime. 90 tablet 0  . simvastatin (ZOCOR) 40 MG tablet Take 1 tablet (40 mg total) by mouth daily. (Patient taking differently: Take 40 mg by mouth at bedtime. ) 30 tablet   . SUMAtriptan (IMITREX) 100 MG tablet One tablet po prn migraine.  May repeat in 2 hrs if needed.  Do not exceed 200 mg per 24 hrs. (Patient taking differently: Take 100 mg by mouth every 2 (two) hours as needed for migraine. One tablet po prn migraine.  May repeat in 2 hrs if needed.  Do not exceed 200 mg per 24 hrs.) 5 tablet 0   No current facility-administered medications for this visit.     PAST MEDICAL HISTORY: Past Medical History:  Diagnosis Date  . Anxiety   . Bipolar disorder ([place])   . Complication of anesthesia   . Degenerative disc  disease, lumbar   . Depression   . Diabetes mellitus   . Elevated cholesterol   . Fibromyalgia   . GERD (gastroesophageal reflux disease)   . Migraine   . Neuropathy   . Osteoarthritis   . PONV (postoperative nausea and vomiting)   . PTSD (post-traumatic stress disorder)    after death of son (accidental overdose)  . Thyroid disease     PAST SURGICAL HISTORY: Past Surgical History:  Procedure Laterality Date  . CARPAL TUNNEL RELEASE  bilateral  . CHOLECYSTECTOMY    . COLONOSCOPY WITH PROPOFOL N/A 05/01/2012   [id number]:[id number] COLON polyps/Mild diverticulosis was noted in the sigmoid colon/Moderate sized internal hemorrhoids. path with tubular adenoma  . gallbladder    . KNEE ARTHROPLASTY Right 08/28/2017   Procedure: COMPUTER ASSISTED  TOTAL KNEE ARTHROPLASTY;  Surgeon: Dereck Leep, MD;  Location: [facility] ORS;  Service: Orthopedics;  Laterality: Right;  . KNEE ARTHROSCOPY     bilateral  . KNEE ARTHROSCOPY WITH MEDIAL MENISECTOMY Right 10/29/2015   Procedure: ARTHROSCOPY RIGHT KNEE  WITH PARTIAL MEDIAL MENISECTOMY;  Surgeon: Carole Civil, MD;  Location: [facility];  Service: Orthopedics;  Laterality: Right;  . POLYPECTOMY N/A 05/01/2012   Procedure: POLYPECTOMY;  Surgeon: Danie Binder, MD;  Location: [facility];  Service: Endoscopy;  Laterality: N/A;  . SHOULDER SURGERY  left  . VAGINAL HYSTERECTOMY      FAMILY HISTORY: Family History  Problem Relation Age of Onset  . Cancer Other   . Diabetes Other   . Heart disease Other   . Arthritis Other   . Stroke Mother   . Anxiety disorder Mother   . Heart failure Mother   . Depression Mother   . Cancer - Lung Father   . Lung disease Father   . Colon cancer Father        father, diagnosed in his late 49s   . Heart failure Sister   . Depression Sister   . Alcohol abuse Brother   . Lung disease Other   . Heart disease Maternal Grandfather   . Stroke Paternal Grandmother   . Drug abuse Other   . Drug abuse Son   . Drug abuse Son      SOCIAL HISTORY:   Social History   Socioeconomic History  . Marital status: Single    Spouse name: Not on file  . Number of children: 2  . Years of education: 34  . Highest education level: High school graduate  Occupational History  . Occupation: unemployed    Fish farm manager: UNEMPLOYED  Social Needs  . Financial resource strain: Not on file  . Food insecurity    Worry: Not on file    Inability: Not on file  . Transportation needs    Medical: Not on file    Non-medical: Not on file  Tobacco Use  . Smoking status: Current Every Day Smoker    Packs/day: 0.50    Years: 13.00    Pack years: 6.50    Types: Cigarettes  . Smokeless tobacco: Never Used  Substance and Sexual Activity  . Alcohol use: No  . Drug use: No  . Sexual activity: Not Currently    Birth control/protection: Surgical  Lifestyle  . Physical activity    Days per week: Not on file    Minutes per session: Not on file  . Stress: Not on file  Relationships  . Social Herbalist on phone: Not on file    Gets together: Not on file    Attends religious service: Not on file    Active member of club or organization: Not on file    Attends meetings of clubs or organizations: Not on file    Relationship status: Not on file  . Intimate partner violence    Fear of current or ex partner: Not on file    Emotionally abused: Not on file    Physically abused: Not on file    Forced sexual activity: Not on file  Other Topics Concern  . Not on file  Social History Narrative   Lives at home with her boyfriend and her son.   Right-handed.   No caffeine use.   Two children - one living, one deceased.    Marcial Pacas, M.D. Ph.D.  [facility] [address] [place], [place] [address] Ph: [phone] Fax: [phone]  CC: Tonya Noble, MD

## 2018-10-09 ENCOUNTER — Ambulatory Visit (HOSPITAL_COMMUNITY): Payer: Medicare HMO | Admitting: Psychiatry

## 2018-10-12 DIAGNOSIS — Z0100 Encounter for examination of eyes and vision without abnormal findings: Secondary | ICD-10-CM | POA: Diagnosis not present

## 2018-10-12 DIAGNOSIS — H5200 Hypermetropia, unspecified eye: Secondary | ICD-10-CM | POA: Diagnosis not present

## 2018-10-16 ENCOUNTER — Ambulatory Visit (HOSPITAL_COMMUNITY): Payer: Medicare HMO | Admitting: Licensed Clinical Social Worker

## 2018-10-16 ENCOUNTER — Other Ambulatory Visit: Payer: Self-pay
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
## 2018-11-05 DIAGNOSIS — M7052 Other bursitis of knee, left knee: Secondary | ICD-10-CM | POA: Diagnosis not present

## 2018-11-05 DIAGNOSIS — M25562 Pain in left knee: Secondary | ICD-10-CM | POA: Diagnosis not present

## 2018-11-06 ENCOUNTER — Encounter (HOSPITAL_COMMUNITY): Payer: Self-pay | Admitting: Psychiatry

## 2018-11-06 ENCOUNTER — Ambulatory Visit (INDEPENDENT_AMBULATORY_CARE_PROVIDER_SITE_OTHER): Payer: Medicare HMO | Admitting: Psychiatry

## 2018-11-06 ENCOUNTER — Other Ambulatory Visit: Payer: Self-pay

## 2018-11-06 DIAGNOSIS — F4310 Post-traumatic stress disorder, unspecified: Secondary | ICD-10-CM | POA: Diagnosis not present

## 2018-11-06 DIAGNOSIS — F330 Major depressive disorder, recurrent, mild: Secondary | ICD-10-CM | POA: Diagnosis not present

## 2018-11-06 MED ORDER — METHYLPHENIDATE HCL 10 MG PO TABS: 10.0000 mg | ORAL_TABLET | Freq: Two times a day (BID) | ORAL | 0 refills | Status: DC

## 2018-11-06 MED ORDER — ESCITALOPRAM OXALATE 10 MG PO TABS: 10.0000 mg | ORAL_TABLET | Freq: Every day | ORAL | 0 refills | Status: DC

## 2018-11-06 MED ORDER — CLONAZEPAM 0.5 MG PO TABS: 0.5000 mg | ORAL_TABLET | Freq: Three times a day (TID) | ORAL | 2 refills | Status: DC | PRN

## 2018-11-06 NOTE — Patient Instructions (Signed)
1. Decrease lexapro 10 mg daily 2. Continue bupropion 100 mg BID (she prefers SR) 3. ContinueQuetiapine200mg  at night 4. Continueritalin 10 mg twice a day  5. Continue clonazepam 0.5 mg three times a day for anxiety 6.Next appointment : 10/20 at 8:40

## 2018-11-07 DIAGNOSIS — M47816 Spondylosis without myelopathy or radiculopathy, lumbar region: Secondary | ICD-10-CM | POA: Diagnosis not present

## 2018-11-07 DIAGNOSIS — M48061 Spinal stenosis, lumbar region without neurogenic claudication: Secondary | ICD-10-CM | POA: Diagnosis not present

## 2018-11-12 ENCOUNTER — Telehealth: Payer: Self-pay | Admitting: Neurology

## 2018-11-12 MED ORDER — PREGABALIN 150 MG PO CAPS: 150.0000 mg | ORAL_CAPSULE | Freq: Three times a day (TID) | ORAL | 5 refills | Status: DC

## 2018-11-12 NOTE — Telephone Encounter (Signed)
Spoke to the patient.  She is agreeable to the Lyrica increase.

## 2018-11-12 NOTE — Telephone Encounter (Signed)
Pt is asking if the strength of her pregabalin (LYRICA) 100 MG capsule can be increased because it no longer helps.  Pt states she feels an electrical shock thru her arms and legs at night. The pain is worsening.  Please call

## 2018-11-12 NOTE — Telephone Encounter (Signed)
I have increased her lyrica to 150mg  tid, New Rx were sent in

## 2018-11-12 NOTE — Addendum Note (Signed)
Addended by: Marcial Pacas on: 11/12/2018 04:15 PM   Modules accepted: Orders

## 2018-11-12 NOTE — Telephone Encounter (Signed)
She is taking Lyrica 100mg  TID.  She has a pending NCV/EMG on 01/16/2019.

## 2018-11-20 ENCOUNTER — Other Ambulatory Visit: Payer: Self-pay

## 2018-11-20 ENCOUNTER — Ambulatory Visit (INDEPENDENT_AMBULATORY_CARE_PROVIDER_SITE_OTHER): Payer: Medicare HMO | Admitting: Licensed Clinical Social Worker

## 2018-11-20 ENCOUNTER — Encounter (HOSPITAL_COMMUNITY): Payer: Self-pay | Admitting: Licensed Clinical Social Worker

## 2018-11-20 DIAGNOSIS — F0630 Mood disorder due to known physiological condition, unspecified: Secondary | ICD-10-CM

## 2018-11-20 NOTE — Progress Notes (Signed)
Virtual Visit via Telephone Note  I connected with [place] on 11/20/18 at  9:00 AM EDT by telephone and verified that I am speaking with the correct person using two identifiers.  Location: Patient: Home Provider: Office   I discussed the limitations, risks, security and privacy concerns of performing an evaluation and management service by telephone and the availability of in person appointments. I also discussed with the patient that there may be a patient responsible charge related to this service. The patient expressed understanding and agreed to proceed.   THERAPIST PROGRESS NOTE  Session Time: 9:00 am-9:40 am  Participation Level: Active  Behavioral Response: CasualAlertDepressed  Type of Therapy: Individual Therapy  Treatment Goals addressed: Coping  Interventions: CBT and Solution Focused  Summary: [name] is a 58 y.o. female who presents with oriented x5 (person, place, situation, time and object), casually dressed, appropriately groomed, average height, overweight and cooperative to address mood. Patient has a history of mental health treatment including outpatient therapy and medication management. Patient has a history of medical treatment including diabetes, back pain and chronic pain. Patient denies suicidal and homicidal ideations. Patient denies psychosis including auditory and visual hallucinations. Patient denies substance abuse. Patient is at low risk for lethality at this time.   Physically: Patient is experiencing internal shaking. She has limited appetite. Patient's sleep is good. Patient experiences daily aches and pains.   Spiritually/values: No issues identified.  Relationships: Patient gets triggered by her sister. She feels like her sister has lots of expectations for her that she is unable to meet such as coming to her home on a daily basis. Patient's youngest son is doing well. He is going to get connected with a good job soon.   Emotionally/Mentally/Behavior: Patient feels anxious. She has been thinking about her son who has passed away. Patient understood that she needs to become aware of the thoughts that she is having. Patient understood that thoughts, views, and beliefs lead to feelings and behaviors. Patient agreed to pay attention to her thoughts.   Patient engaged in session. She responded well to interventions. Patient continues to meet criteria for PTSD. Patient will continue in outpatient therapy due to being the least restrictive service to meet her needs. Patient made minimal progress on her goals.   Suicidal/Homicidal: Negativewithout intent/plan  Therapist Response: Therapist reviewed patient's recent thoughts and behaviors. Therapist utilized CBT to address mood. Therapist processed patient's feelings to identify triggers for mood. Therapist discussed patient's thoughts that lead to anxiety.   Plan: Return again in 4 weeks.  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 The patient was provided an opportunity to ask questions and all were answered. The patient agreed with the plan and demonstrated an understanding of the instructions.   The patient was advised to call back or seek an in-person evaluation if the symptoms worsen or if the condition fails to improve as anticipated.  I provided 40 minutes of non-face-to-face time during this encounter.   Glori Bickers, LCSW 11/20/2018

## 2018-11-20 NOTE — Patient Instructions (Signed)
[place]  11/20/2018     @PREFPERIOPPHARMACY @   Your procedure is scheduled on  11/29/2018.  Report to [facility] at  0700  A.M.  Call this number if you have problems the morning of surgery:  [phone]   Remember:  Follow the diet and prep instructions given to you by Dr Nona Dell office.                      Take these medicines the morning of surgery with A SIP OF WATER  Weelbutrin, clonazepam, lexapro, xyzal, levothyroxine, robaxin(if needed), ritalin, oxycodone(if needed), protonix, lyrica, imitrex (if needed).    Do not wear jewelry, make-up or nail polish.  Do not wear lotions, powders, or perfumes. Please brush your teeth and wear deodorant.  Do not shave 48 hours prior to surgery.  Men may shave face and neck.  Do not bring valuables to the hospital.  [facility] is not responsible for any belongings or valuables.  Contacts, dentures or bridgework may not be worn into surgery.  Leave your suitcase in the car.  After surgery it may be brought to your room.  For patients admitted to the hospital, discharge time will be determined by your treatment team.  Patients discharged the day of surgery will not be allowed to drive home.   Name and phone number of your driver:   family Special instructions:  DO NOT take any medications for diabetes the morning of your procedure.  Please read over the following fact sheets that you were given. Anesthesia Post-op Instructions and Care and Recovery After Surgery       Colonoscopy, Adult, Care After This sheet gives you information about how to care for yourself after your procedure. Your health care provider may also give you more specific instructions. If you have problems or questions, contact your health care provider. What can I expect after the procedure? After the procedure, it is common to have:  A small amount of blood in your stool for 24 hours after the procedure.  Some gas.  Mild abdominal cramping  or bloating. Follow these instructions at home: General instructions  For the first 24 hours after the procedure: ? Do not drive or use machinery. ? Do not sign important documents. ? Do not drink alcohol. ? Do your regular daily activities at a slower pace than normal. ? Eat soft, easy-to-digest foods.  Take over-the-counter or prescription medicines only as told by your health care provider. Relieving cramping and bloating   Try walking around when you have cramps or feel bloated.  Apply heat to your abdomen as told by your health care provider. Use a heat source that your health care provider recommends, such as a moist heat pack or a heating pad. ? Place a towel between your skin and the heat source. ? Leave the heat on for 20-30 minutes. ? Remove the heat if your skin turns bright red. This is especially important if you are unable to feel pain, heat, or cold. You may have a greater risk of getting burned. Eating and drinking   Drink enough fluid to keep your urine pale yellow.  Resume your normal diet as instructed by your health care provider. Avoid heavy or fried foods that are hard to digest.  Avoid drinking alcohol for as long as instructed by your health care provider. Contact a health care provider if:  You have blood in your stool 2-3 days after  the procedure. Get help right away if:  You have more than a small spotting of blood in your stool.  You pass large blood clots in your stool.  Your abdomen is swollen.  You have nausea or vomiting.  You have a fever.  You have increasing abdominal pain that is not relieved with medicine. Summary  After the procedure, it is common to have a small amount of blood in your stool. You may also have mild abdominal cramping and bloating.  For the first 24 hours after the procedure, do not drive or use machinery, sign important documents, or drink alcohol.  Contact your health care provider if you have a lot of blood  in your stool, nausea or vomiting, a fever, or increased abdominal pain. This information is not intended to replace advice given to you by your health care provider. Make sure you discuss any questions you have with your health care provider. Document Released: 10/20/2003 Document Revised: 12/28/2016 Document Reviewed: 05/19/2015 Elsevier Patient Education  2020 [place] After These instructions provide you with information about caring for yourself after your procedure. Your health care provider may also give you more specific instructions. Your treatment has been planned according to current medical practices, but problems sometimes occur. Call your health care provider if you have any problems or questions after your procedure. What can I expect after the procedure? After your procedure, you may:  Feel sleepy for several hours.  Feel clumsy and have poor balance for several hours.  Feel forgetful about what happened after the procedure.  Have poor judgment for several hours.  Feel nauseous or vomit.  Have a sore throat if you had a breathing tube during the procedure. Follow these instructions at home: For at least 24 hours after the procedure:      Have a responsible adult stay with you. It is important to have someone help care for you until you are awake and alert.  Rest as needed.  Do not: ? Participate in activities in which you could fall or become injured. ? Drive. ? Use heavy machinery. ? Drink alcohol. ? Take sleeping pills or medicines that cause drowsiness. ? Make important decisions or sign legal documents. ? Take care of children on your own. Eating and drinking  Follow the diet that is recommended by your health care provider.  If you vomit, drink water, juice, or soup when you can drink without vomiting.  Make sure you have little or no nausea before eating solid foods. General instructions  Take over-the-counter  and prescription medicines only as told by your health care provider.  If you have sleep apnea, surgery and certain medicines can increase your risk for breathing problems. Follow instructions from your health care provider about wearing your sleep device: ? Anytime you are sleeping, including during daytime naps. ? While taking prescription pain medicines, sleeping medicines, or medicines that make you drowsy.  If you smoke, do not smoke without supervision.  Keep all follow-up visits as told by your health care provider. This is important. Contact a health care provider if:  You keep feeling nauseous or you keep vomiting.  You feel light-headed.  You develop a rash.  You have a fever. Get help right away if:  You have trouble breathing. Summary  For several hours after your procedure, you may feel sleepy and have poor judgment.  Have a responsible adult stay with you for at least 24 hours or until you are awake  and alert. This information is not intended to replace advice given to you by your health care provider. Make sure you discuss any questions you have with your health care provider. Document Released: 06/28/2015 Document Revised: 06/05/2017 Document Reviewed: 06/28/2015 Elsevier Patient Education  2020 [employer].

## 2018-11-23 ENCOUNTER — Encounter (HOSPITAL_COMMUNITY): Payer: Self-pay

## 2018-11-23 ENCOUNTER — Encounter (HOSPITAL_COMMUNITY)
Admission: RE | Admit: 2018-11-23 | Discharge: 2018-11-23 | Disposition: A | Discharge status: Home / Self Care | Payer: Medicare HMO | Source: Ambulatory Visit | Attending: Gastroenterology | Admitting: Gastroenterology

## 2018-11-27 ENCOUNTER — Other Ambulatory Visit: Payer: Self-pay

## 2018-11-27 ENCOUNTER — Other Ambulatory Visit (HOSPITAL_COMMUNITY)
Admission: RE | Admit: 2018-11-27 | Discharge: 2018-11-27 | Disposition: A | Discharge status: Home / Self Care | Payer: Medicare HMO | Source: Ambulatory Visit | Attending: Gastroenterology | Admitting: Gastroenterology

## 2018-11-27 DIAGNOSIS — Z20828 Contact with and (suspected) exposure to other viral communicable diseases: Secondary | ICD-10-CM | POA: Diagnosis not present

## 2018-11-27 DIAGNOSIS — Z01812 Encounter for preprocedural laboratory examination: Secondary | ICD-10-CM | POA: Insufficient documentation

## 2018-11-27 NOTE — Patient Instructions (Signed)
[place]  11/27/2018     @PREFPERIOPPHARMACY @   Your procedure is scheduled on 11/29/2018.  Report to [facility] at 7:00 A.M.  Call this number if you have problems the morning of surgery:  [phone]   Remember:    Do not eat or drink after midnight.   Please follow instructions from Dr. Oneida Alar office as far as prep is concerned.     Take these medicines the morning of surgery with A SIP OF WATER Wellbutrin, Klonopin, Lexapro, Synthroid,Protonix, Lyrica and Flonase    Do not wear jewelry, make-up or nail polish.  Do not wear lotions, powders, or perfumes, or deodorant.  Do not shave 48 hours prior to surgery.  Men may shave face and neck.  Do not bring valuables to the hospital.  [facility] is not responsible for any belongings or valuables.  Contacts, dentures or bridgework may not be worn into surgery.  Leave your suitcase in the car.  After surgery it may be brought to your room.  For patients admitted to the hospital, discharge time will be determined by your treatment team.  Patients discharged the day of surgery will not be allowed to drive home.   Name and phone number of your driver:   family Special instructions:  n/a  Please read over the following fact sheets that you were given. Care and Recovery After Surgery   Colonoscopy, Adult A colonoscopy is an exam to look at the entire large intestine. During the exam, a lubricated, flexible tube that has a camera on the end of it is inserted into the anus and then passed into the rectum, colon, and other parts of the large intestine. You may have a colonoscopy as a part of normal colorectal screening or if you have certain symptoms, such as:  Lack of red blood cells (anemia).  Diarrhea that does not go away.  Abdominal pain.  Blood in your stool (feces). A colonoscopy can help screen for and diagnose medical problems, including:  Tumors.  Polyps.  Inflammation.  Areas of bleeding. Tell a  health care provider about:  Any allergies you have.  All medicines you are taking, including vitamins, herbs, eye drops, creams, and over-the-counter medicines.  Any problems you or family members have had with anesthetic medicines.  Any blood disorders you have.  Any surgeries you have had.  Any medical conditions you have.  Any problems you have had passing stool. What are the risks? Generally, this is a safe procedure. However, problems may occur, including:  Bleeding.  A tear in the intestine.  A reaction to medicines given during the exam.  Infection (rare). What happens before the procedure? Eating and drinking restrictions Follow instructions from your health care provider about eating and drinking, which may include:  A few days before the procedure - follow a low-fiber diet. Avoid nuts, seeds, dried fruit, raw fruits, and vegetables.  1-3 days before the procedure - follow a clear liquid diet. Drink only clear liquids, such as clear broth or bouillon, black coffee or tea, clear juice, clear soft drinks or sports drinks, gelatin dessert, and popsicles. Avoid any liquids that contain red or purple dye.  On the day of the procedure - do not eat or drink anything starting 2 hours before the procedure, or within the time period that your health care provider recommends. Up to 2 hours before the procedure, you may continue to drink clear liquids, such as water or clear fruit juice. Bowel prep  If you were prescribed an oral bowel prep to clean out your colon:  Take it as told by your health care provider. Starting the day before your procedure, you will need to drink a large amount of medicated liquid. The liquid will cause you to have multiple loose stools until your stool is almost clear or light green.  If your skin or anus gets irritated from diarrhea, you may use these to relieve the irritation: ? Medicated wipes, such as adult wet wipes with aloe and vitamin E. ? A  skin-soothing product like petroleum jelly.  If you vomit while drinking the bowel prep, take a break for up to 60 minutes and then begin the bowel prep again. If vomiting continues and you cannot take the bowel prep without vomiting, call your health care provider.  To clean out your colon, you may also be given: ? Laxative medicines. ? Instructions about how to use an enema. General instructions  Ask your health care provider about: ? Changing or stopping your regular medicines or supplements. This is especially important if you are taking iron supplements, diabetes medicines, or blood thinners. ? Taking medicines such as aspirin and ibuprofen. These medicines can thin your blood. Do not take these medicines before the procedure if your health care provider tells you not to.  Plan to have someone take you home from the hospital or clinic. What happens during the procedure?   An IV may be inserted into one of your veins.  You will be given medicine to help you relax (sedative).  To reduce your risk of infection: ? Your health care team will wash or sanitize their hands. ? Your anal area will be washed with soap.  You will be asked to lie on your side with your knees bent.  Your health care provider will lubricate a long, thin, flexible tube. The tube will have a camera and a light on the end.  The tube will be inserted into your anus.  The tube will be gently eased through your rectum and colon.  Air will be delivered into your colon to keep it open. You may feel some pressure or cramping.  The camera will be used to take images during the procedure.  A small tissue sample may be removed to be examined under a microscope (biopsy).  If small polyps are found, your health care provider may remove them and have them checked for cancer cells.  When the exam is done, the tube will be removed. The procedure may vary among health care providers and hospitals. What happens after  the procedure?  Your blood pressure, heart rate, breathing rate, and blood oxygen level will be monitored until the medicines you were given have worn off.  Do not drive for 24 hours after the exam.  You may have a small amount of blood in your stool.  You may pass gas and have mild abdominal cramping or bloating due to the air that was used to inflate your colon during the exam.  It is up to you to get the results of your procedure. Ask your health care provider, or the department performing the procedure, when your results will be ready. Summary  A colonoscopy is an exam to look at the entire large intestine.  During a colonoscopy, a lubricated, flexible tube with a camera on the end of it is inserted into the anus and then passed into the colon and other parts of the large intestine.  Follow instructions from your  health care provider about eating and drinking before the procedure.  If you were prescribed an oral bowel prep to clean out your colon, take it as told by your health care provider.  After your procedure, your blood pressure, heart rate, breathing rate, and blood oxygen level will be monitored until the medicines you were given have worn off. This information is not intended to replace advice given to you by your health care provider. Make sure you discuss any questions you have with your health care provider. Document Released: 03/04/2000 Document Revised: 12/28/2016 Document Reviewed: 05/19/2015 Elsevier Patient Education  2020 [employer].

## 2018-11-28 ENCOUNTER — Encounter (HOSPITAL_COMMUNITY)
Admission: RE | Admit: 2018-11-28 | Discharge: 2018-11-28 | Disposition: A | Discharge status: Home / Self Care | Payer: Medicare HMO | Source: Ambulatory Visit | Attending: Gastroenterology | Admitting: Gastroenterology

## 2018-11-28 ENCOUNTER — Encounter (HOSPITAL_COMMUNITY): Payer: Self-pay | Admitting: Anesthesiology

## 2018-11-28 ENCOUNTER — Other Ambulatory Visit: Payer: Self-pay

## 2018-11-28 ENCOUNTER — Encounter (HOSPITAL_COMMUNITY): Payer: Self-pay

## 2018-11-28 DIAGNOSIS — Z01812 Encounter for preprocedural laboratory examination: Secondary | ICD-10-CM | POA: Insufficient documentation

## 2018-11-28 HISTORY — DX: Hypothyroidism, unspecified: E03.9

## 2018-11-28 LAB — BASIC METABOLIC PANEL
Anion gap: 8 (ref 5–15)
BUN: 30 mg/dL — ABNORMAL HIGH (ref 6–20)
CO2: 26 mmol/L (ref 22–32)
Calcium: 8.8 mg/dL — ABNORMAL LOW (ref 8.9–10.3)
Chloride: 102 mmol/L (ref 98–111)
Creatinine, Ser: 0.73 mg/dL (ref 0.44–1.00)
GFR calc Af Amer: >60 mL/min (ref >60)
GFR calc non Af Amer: >60 mL/min (ref >60)
Glucose, Bld: 161 mg/dL — ABNORMAL HIGH (ref 70–99)
Potassium: 4.2 mmol/L (ref 3.5–5.1)
Sodium: 136 mmol/L (ref 135–145)

## 2018-11-28 LAB — SARS CORONAVIRUS 2 (TAT 6-24 HRS): SARS Coronavirus 2: NEGATIVE

## 2018-11-29 ENCOUNTER — Telehealth: Payer: Self-pay | Admitting: *Deleted

## 2018-11-29 ENCOUNTER — Encounter (HOSPITAL_COMMUNITY): Payer: Self-pay | Admitting: Gastroenterology

## 2018-11-29 ENCOUNTER — Encounter (HOSPITAL_COMMUNITY): Admission: RE | Payer: Self-pay | Source: Home / Self Care

## 2018-11-29 ENCOUNTER — Ambulatory Visit (HOSPITAL_COMMUNITY): Admission: RE | Admit: 2018-11-29 | Payer: Medicare HMO | Source: Home / Self Care | Admitting: Gastroenterology

## 2018-11-29 SURGERY — COLONOSCOPY WITH PROPOFOL
Anesthesia: Monitor Anesthesia Care

## 2018-11-29 NOTE — Progress Notes (Signed)
CC'D TO PCP °

## 2018-11-29 NOTE — Telephone Encounter (Signed)
Patient called. She was on for procedure today with Dr. Oneida Alar. She was given trilyte prep. She has only have 3 BM's and they were hard. She has not had any soft, runny stools. Patient called endo and cancelled. Please advise Dr. Oneida Alar regarding prep for rescheduling thanks

## 2018-12-04 DIAGNOSIS — M1732 Unilateral post-traumatic osteoarthritis, left knee: Secondary | ICD-10-CM | POA: Diagnosis not present

## 2018-12-05 ENCOUNTER — Other Ambulatory Visit (HOSPITAL_COMMUNITY): Payer: Self-pay | Admitting: Physician Assistant

## 2018-12-05 ENCOUNTER — Other Ambulatory Visit: Payer: Self-pay | Admitting: Physician Assistant

## 2018-12-05 ENCOUNTER — Other Ambulatory Visit (HOSPITAL_COMMUNITY): Payer: Self-pay | Admitting: Internal Medicine

## 2018-12-05 DIAGNOSIS — M1732 Unilateral post-traumatic osteoarthritis, left knee: Secondary | ICD-10-CM

## 2018-12-05 NOTE — Telephone Encounter (Signed)
LMOVM for pt. Advised will call to r/s once we received [facility] December schedule

## 2018-12-05 NOTE — Telephone Encounter (Signed)
PLEASE CALL PT. HER BOWELS ARE DIFFICULT TO PREP DUE TO HER MULTIPLE MEDICATIONS. SHE WILL NEED A THREE DAY BOWEL PREP:    1. 3 DAYS PRIOR SHE WILL NEED TO FOLLOW A FULL LIQUID DIET AND TAKE MAG CITRATE 1.5 BTLS. SHE SHOULD DRINK TWO-THREE 8 OZ GLASSES OF WATER AFTER THE DOSE OF MAG CITRATE.   2. 2 DAYS BEFORE SHE WILL NEED TO FOLLOW A FULL LIQUID DIET AND TAKE MAG CITRATE 1.5 BTLS.HE SHOULD DRINK TWO-THREE 8 OZ GLASSES OF WATER AFTER THE DOSE OF MAG CITRATE.    3. ONE DAY BEFORE SHE WILL NEED A CLEAR LIQUID DIET, AND START A TRILYTE SPLIT PREP.

## 2018-12-05 NOTE — Telephone Encounter (Signed)
Patient called back and is aware.

## 2018-12-06 ENCOUNTER — Other Ambulatory Visit: Payer: Self-pay | Admitting: *Deleted

## 2018-12-06 ENCOUNTER — Encounter: Payer: Self-pay | Admitting: *Deleted

## 2018-12-06 DIAGNOSIS — R197 Diarrhea, unspecified: Secondary | ICD-10-CM

## 2018-12-06 MED ORDER — PEG 3350-KCL-NA BICARB-NACL 420 G PO SOLR: 4000.0000 mL | Freq: Once | ORAL | 0 refills | Status: AC

## 2018-12-06 NOTE — Telephone Encounter (Signed)
Called patient. She is scheduled for 12/1 at 7:30am. Patient aware of new prep instructions and I will send this to her. Also aware Rx for trilyte sent to pharmacy. Orders entered

## 2018-12-10 ENCOUNTER — Telehealth (HOSPITAL_COMMUNITY): Payer: Self-pay | Admitting: *Deleted

## 2018-12-10 ENCOUNTER — Other Ambulatory Visit (HOSPITAL_COMMUNITY): Payer: Self-pay | Admitting: Psychiatry

## 2018-12-10 MED ORDER — QUETIAPINE FUMARATE 200 MG PO TABS: 200.0000 mg | ORAL_TABLET | Freq: Every day | ORAL | 0 refills | Status: DC

## 2018-12-10 NOTE — Telephone Encounter (Signed)
ADDENDUM TO PREVIOUS NOTE: SEROQUEL SHE NOTICED IS 30 TABLETS SHORT PER Rx THEY SCRIPT WAS FILLED ON 10/04/2018

## 2018-12-10 NOTE — Telephone Encounter (Signed)
They should have dispensed for 90 days per order- however, I reordered it again.

## 2018-12-10 NOTE — Telephone Encounter (Signed)
PATIENT CALLED UPSET THAT HER SEROQUEL SHE NOTICED IS 30 TABLETS SHORT & WHEN SHE CALLED THE Rx THEY TOLD HER THEIR MED'S ARE DOUBLE CHECKED BY THE PILL COUNTER. AND THAT SHE WOULD NEED TO F/U WITH HER DOCTOR.   I CALLED THE RX THIS SCRIPT WAS FILLED ON  7/16 & THAT THE SHORTAGE WAS NOTICED BY PATIENT ON YESTERDAY 9/20 & SHE CAME INTO THE Rx.  THE Rx STATES SHE HAS 0 REFILLS LEFT & THAT A NEW SCRIPT WOULD BE NEED IF  PROVIDER WOULD LIKE.

## 2018-12-12 ENCOUNTER — Other Ambulatory Visit: Payer: Self-pay

## 2018-12-12 ENCOUNTER — Ambulatory Visit (INDEPENDENT_AMBULATORY_CARE_PROVIDER_SITE_OTHER): Payer: Medicare HMO | Admitting: Licensed Clinical Social Worker

## 2018-12-12 ENCOUNTER — Encounter (HOSPITAL_COMMUNITY): Payer: Self-pay | Admitting: Licensed Clinical Social Worker

## 2018-12-12 DIAGNOSIS — F0630 Mood disorder due to known physiological condition, unspecified: Secondary | ICD-10-CM | POA: Diagnosis not present

## 2018-12-12 NOTE — Progress Notes (Signed)
Virtual Visit via Telephone Note  I connected with [name] on 12/12/18 at 10:00 AM EDT by telephone and verified that I am speaking with the correct person using two identifiers.  Location: Patient: Home Provider: Office   I discussed the limitations, risks, security and privacy concerns of performing an evaluation and management service by telephone and the availability of in person appointments. I also discussed with the patient that there may be a patient responsible charge related to this service. The patient expressed understanding and agreed to proceed.   THERAPIST PROGRESS NOTE  Session Time: 10:00 am-10:45 am  Participation Level: Active  Behavioral Response: CasualAlertDepressed  Type of Therapy: Individual Therapy  Treatment Goals addressed: Coping  Interventions: CBT and Solution Focused  Summary: [name] is a 58 y.o. female who presents with oriented x5 (person, place, situation, time and object), casually dressed, appropriately groomed, average height, overweight and cooperative to address mood. Patient has a history of mental health treatment including outpatient therapy and medication management. Patient has a history of medical treatment including diabetes, back pain and chronic pain. Patient denies suicidal and homicidal ideations. Patient denies psychosis including auditory and visual hallucinations. Patient denies substance abuse. Patient is at low risk for lethality at this time.   Physically: Patient continues to experience internal shaking. Patient noted that her appetite improved. She also noted that she is waking up less. Patient also noted no matter how much sleep she gets, she feels exhausted. Patient goes to bed and wakes up at the same time. She sleeps with a fan on for "white noise" and to stay cool. Patient avoids any intense shows prior to bed. Patient wakes up during the night to use the bathroom which disrupts her sleep and could be  impacting her ability to get restorative sleep. Patient has been experiencing this tired feeling for the past month.   Spiritually/values: No issues identified.  Relationships: Patient is getting along with others but has difficulty navigating her relationship with her sister at times. Patient wants to spend time with her but also doesn't want to deal with the "crazy."  Emotionally/Mentally/Behavior: Patient's mood has been stable. She denies anxiety and depression. Patient noted reduced irritability. She was able to focus on "catching" herself getting angry which helped her reduce her irritability. After discussion, patient understood that she needs to pay attention to her thoughts to identify what is driving the feeling of irritability or anxiety or depression. Patient agreed to complete a thought log to work on "catching" her thoughts.   Patient engaged in session. She responded well to interventions. Patient continues to meet criteria for PTSD. Patient will continue in outpatient therapy due to being the least restrictive service to meet her needs. Patient made moderate progress on her goals.   Suicidal/Homicidal: Negativewithout intent/plan  Therapist Response: Therapist reviewed patient's recent thoughts and behaviors. Therapist utilized CBT to address mood. Therapist processed patient's feelings to identify triggers for mood. Therapist provided psychoeducation on sleep. Therapist discussed CBT and how thoughts impact feeling. Therapist praised patient for "catching" herself getting irritable which reduced her irritability.   Plan: Return again in 4 weeks.  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 The patient was provided an opportunity to ask questions and all were answered. The patient agreed with the plan and demonstrated an understanding of  the instructions.  The patient was advised to call back or seek an in-person evaluation if the symptoms worsen or if the condition fails to improve as anticipated.  I provided 45 minutes of non-face-to-face time during this encounter.   Glori Bickers, LCSW 12/12/2018

## 2018-12-18 ENCOUNTER — Other Ambulatory Visit: Payer: Self-pay

## 2018-12-18 ENCOUNTER — Ambulatory Visit
Admission: RE | Admit: 2018-12-18 | Discharge: 2018-12-18 | Disposition: A | Discharge status: Home / Self Care | Payer: Medicare HMO | Source: Ambulatory Visit | Attending: Physician Assistant | Admitting: Physician Assistant

## 2018-12-18 DIAGNOSIS — M25562 Pain in left knee: Secondary | ICD-10-CM | POA: Diagnosis not present

## 2018-12-18 DIAGNOSIS — M1732 Unilateral post-traumatic osteoarthritis, left knee: Secondary | ICD-10-CM | POA: Insufficient documentation

## 2018-12-24 DIAGNOSIS — N390 Urinary tract infection, site not specified: Secondary | ICD-10-CM | POA: Diagnosis not present

## 2019-01-02 ENCOUNTER — Encounter (HOSPITAL_COMMUNITY): Payer: Self-pay | Admitting: Licensed Clinical Social Worker

## 2019-01-02 ENCOUNTER — Other Ambulatory Visit: Payer: Self-pay

## 2019-01-02 ENCOUNTER — Telehealth: Payer: Self-pay | Admitting: Neurology

## 2019-01-02 ENCOUNTER — Ambulatory Visit (INDEPENDENT_AMBULATORY_CARE_PROVIDER_SITE_OTHER): Payer: Medicare HMO | Admitting: Licensed Clinical Social Worker

## 2019-01-02 DIAGNOSIS — F0630 Mood disorder due to known physiological condition, unspecified: Secondary | ICD-10-CM

## 2019-01-02 DIAGNOSIS — F319 Bipolar disorder, unspecified: Secondary | ICD-10-CM | POA: Diagnosis not present

## 2019-01-02 DIAGNOSIS — Z79899 Other long term (current) drug therapy: Secondary | ICD-10-CM | POA: Diagnosis not present

## 2019-01-02 DIAGNOSIS — E1140 Type 2 diabetes mellitus with diabetic neuropathy, unspecified: Secondary | ICD-10-CM | POA: Diagnosis not present

## 2019-01-02 DIAGNOSIS — G43909 Migraine, unspecified, not intractable, without status migrainosus: Secondary | ICD-10-CM | POA: Diagnosis not present

## 2019-01-02 DIAGNOSIS — E538 Deficiency of other specified B group vitamins: Secondary | ICD-10-CM | POA: Diagnosis not present

## 2019-01-02 DIAGNOSIS — M797 Fibromyalgia: Secondary | ICD-10-CM | POA: Diagnosis not present

## 2019-01-02 DIAGNOSIS — E785 Hyperlipidemia, unspecified: Secondary | ICD-10-CM | POA: Diagnosis not present

## 2019-01-02 MED ORDER — PREGABALIN 200 MG PO CAPS: 200.0000 mg | ORAL_CAPSULE | Freq: Three times a day (TID) | ORAL | 5 refills | Status: DC

## 2019-01-02 NOTE — Telephone Encounter (Signed)
I spoke to the patient and she is agreeable to the increased dose of Lyrica.  She will keep her pending NCV/EMG appt on 01/16/2019.

## 2019-01-02 NOTE — Progress Notes (Signed)
Virtual Visit via Telephone Note  I connected with [name] on 01/02/19 at 11:00 AM EDT by telephone and verified that I am speaking with the correct person using two identifiers.  Location: Patient: Home Provider: Office   I discussed the limitations, risks, security and privacy concerns of performing an evaluation and management service by telephone and the availability of in person appointments. I also discussed with the patient that there may be a patient responsible charge related to this service. The patient expressed understanding and agreed to proceed.   THERAPIST PROGRESS NOTE  Session Time: 11:00 am-11:45 am  Participation Level: Active  Behavioral Response: CasualAlertDepressed  Type of Therapy: Individual Therapy  Treatment Goals addressed: Coping  Interventions: CBT and Solution Focused  Summary: [name] is a 58 y.o. female who presents with oriented x5 (person, place, situation, time and object), casually dressed, appropriately groomed, average height, overweight and cooperative to address mood. Patient has a history of mental health treatment including outpatient therapy and medication management. Patient has a history of medical treatment including diabetes, back pain and chronic pain. Patient denies suicidal and homicidal ideations. Patient denies psychosis including auditory and visual hallucinations. Patient denies substance abuse. Patient is at low risk for lethality at this time.   Physically: Patient is doing better physically. Patient doesn't have the "shaky" feeling and sleeping better.    Spiritually/values: No issues identified.  Relationships: Patient has changed how she is dealing with her sister. She is interacting with her but trying to take the higher road. This has changed the "dance" that they have been involved in for years. Patient is getting along with her other family member.   Emotionally/Mentally/Behavior: Patient's mood has been  stable but also improved. Patient recognized that her sister was a major trigger for her and she is no longer "shaking" because she has changed how she interacts with her sister. Patient is trying to view things in a more hopeful way. She is focusing on the positive memories of her mother and her oldest son who are both deceased. This is a shift in thinking for her. She had focused on wanting them to be there with her instead of focusing on the positive memories. Patient acknowledged that her thoughts and views change how she views things and she is choosing to view things in a more positive light.   Patient engaged in session. She responded well to interventions. Patient continues to meet criteria for PTSD. Patient will continue in outpatient therapy due to being the least restrictive service to meet her needs. Patient made moderate progress on her goals.   Suicidal/Homicidal: Negativewithout intent/plan  Therapist Response: Therapist reviewed patient's recent thoughts and behaviors. Therapist utilized CBT to address mood. Therapist processed patient's feelings to identify triggers for mood. Therapist had patient identify what has improved for her. Therapist assisted patient to identify her resources and strengths she already has to solve her problems.    Plan: Return again in 4 weeks.  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 The patient was provided an opportunity to ask questions and all were answered. The patient agreed with the plan and demonstrated an understanding of the instructions.   The patient was advised to call back or seek an in-person evaluation if the symptoms worsen or if the condition fails to improve as anticipated.  I provided  45 minutes of non-face-to-face time during this encounter.   Glori Bickers, LCSW 01/02/2019

## 2019-01-02 NOTE — Telephone Encounter (Signed)
Please let patient know that I have called in Lyrica 200mg  tid for her

## 2019-01-02 NOTE — Telephone Encounter (Signed)
The patient had a virtual visit on 09/18/2018 and was started on Lyrica 100mg  TID.  She called our office, with continued symptoms on 11/12/2018, and her Lyrica was increased to 150mg  TID.  She is still reporting discomfort and has taken extra doses of Lyrica.  She is now going to be short on her medication by one week prior to her next refill.  She is asking for another adjustment to her dosage.  She has a pending NCV/EMG on 01/16/2019.

## 2019-01-02 NOTE — Telephone Encounter (Signed)
Pt called stating that she is needing an increase on the dosage on her pregabalin (LYRICA) 150 MG capsule because she is woken up in the middle of the night with shock sensations and is needing to take more of the mediatation and is now a week short. Please advise.

## 2019-01-02 NOTE — Telephone Encounter (Signed)
Left message requesting a return call.

## 2019-01-02 NOTE — Addendum Note (Signed)
Addended by: Marcial Pacas on: 01/02/2019 03:34 PM   Modules accepted: Orders
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
## 2019-01-07 DIAGNOSIS — E785 Hyperlipidemia, unspecified: Secondary | ICD-10-CM | POA: Diagnosis not present

## 2019-01-07 DIAGNOSIS — E039 Hypothyroidism, unspecified: Secondary | ICD-10-CM | POA: Diagnosis not present

## 2019-01-07 DIAGNOSIS — K760 Fatty (change of) liver, not elsewhere classified: Secondary | ICD-10-CM | POA: Diagnosis not present

## 2019-01-07 DIAGNOSIS — E1140 Type 2 diabetes mellitus with diabetic neuropathy, unspecified: Secondary | ICD-10-CM | POA: Diagnosis not present

## 2019-01-07 DIAGNOSIS — F319 Bipolar disorder, unspecified: Secondary | ICD-10-CM | POA: Diagnosis not present

## 2019-01-08 ENCOUNTER — Ambulatory Visit (HOSPITAL_COMMUNITY): Payer: Medicare HMO | Admitting: Psychiatry

## 2019-01-08 DIAGNOSIS — M2392 Unspecified internal derangement of left knee: Secondary | ICD-10-CM | POA: Diagnosis not present

## 2019-01-10 ENCOUNTER — Encounter (HOSPITAL_COMMUNITY): Payer: Medicare HMO | Admitting: Psychiatry

## 2019-01-10 ENCOUNTER — Other Ambulatory Visit: Payer: Self-pay

## 2019-01-16 ENCOUNTER — Ambulatory Visit (INDEPENDENT_AMBULATORY_CARE_PROVIDER_SITE_OTHER): Payer: Medicare HMO | Admitting: Neurology

## 2019-01-16 ENCOUNTER — Encounter (INDEPENDENT_AMBULATORY_CARE_PROVIDER_SITE_OTHER): Payer: Medicare HMO | Admitting: Neurology

## 2019-01-16 ENCOUNTER — Other Ambulatory Visit: Payer: Self-pay

## 2019-01-16 DIAGNOSIS — G6289 Other specified polyneuropathies: Secondary | ICD-10-CM

## 2019-01-16 DIAGNOSIS — R202 Paresthesia of skin: Secondary | ICD-10-CM

## 2019-01-16 DIAGNOSIS — Z0289 Encounter for other administrative examinations: Secondary | ICD-10-CM

## 2019-01-16 NOTE — Procedures (Signed)
Full Name: [name] Gender: Female MRN #: [id number] Date of Birth: 04-04-1960    Visit Date: 01/16/2019 09:09 Age: 58 Years 66 Months Old Examining Physician: Marcial Pacas, MD  Referring Physician: Marcial Pacas, MD History: 58 year old female, complains of bilateral lower extremity paresthesia.  Summary of the tests:  Nerve conduction study: Bilateral sural, left superficial peroneal sensory responses showed a decreased snap amplitude.  Right superficial peroneal sensory response was normal.  Bilateral tibial peroneal to EDB motor responses were normal.    Electromyography: Selected needle examinations of bilateral lower extremity muscles and lumbosacral paraspinal muscles were normal.  Conclusion: This is a mild abnormal study.  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  There is no evidence of bilateral lumbosacral radiculopathy.    ------------------------------- Marcial Pacas, M.D. PhD  [facility] [place], [place] [address] Tel: [phone] Fax: [phone]        [facility]    Nerve / Sites Muscle Latency Ref. Amplitude Ref. Rel Amp Segments Distance Velocity Ref. Area    ms ms mV mV %  cm m/s m/s mVms  L Peroneal - EDB     Ankle EDB 4.8 ?6.5 3.5 ?2.0 100 Ankle - EDB 9   10.7     Fib head EDB 10.9  3.3  94.2 Fib head - Ankle 29 48 ?44 10.4     Pop fossa EDB 13.0  3.1  96.3 Pop fossa - Fib head 10 48 ?44 10.2         Pop fossa - Ankle      R Peroneal - EDB     Ankle EDB 4.6 ?6.5 4.7 ?2.0 100 Ankle - EDB 9   14.4     Fib head EDB 10.7  4.1  88.5 Fib head - Ankle 29 48 ?44 13.3     Pop fossa EDB 12.9  3.7  90.6 Pop fossa - Fib head 10 46 ?44 12.6         Pop fossa - Ankle      L Tibial - AH     Ankle AH 3.5 ?5.8 7.1 ?4.0 100 Ankle - AH 9   15.6     Pop fossa AH 11.9  4.9  69.2 Pop fossa - Ankle 36 43 ?41 13.7  R Tibial - AH     Ankle AH 3.5 ?5.8 6.7 ?4.0 100 Ankle - AH  9   12.9     Pop fossa AH 12.2  4.7  71 Pop fossa - Ankle 36 42 ?41 11.1             SNC    Nerve / Sites Rec. Site Peak Lat Ref.  Amp Ref. Segments Distance    ms ms V V  cm  L Sural - Ankle (Calf)     Calf Ankle 3.7 ?4.4 5 ?6 Calf - Ankle 14  R Sural - Ankle (Calf)     Calf Ankle 3.2 ?4.4 3 ?6 Calf - Ankle 14  L Superficial peroneal - Ankle     Lat leg Ankle 4.0 ?4.4 3 ?6 Lat leg - Ankle 14  R Superficial peroneal - Ankle     Lat leg Ankle 3.7 ?4.4 6 ?6 Lat leg - Ankle 14              F  Wave    Nerve F Lat Ref.   ms ms  L Tibial -  AH 55.4 ?56.0  R Tibial - AH 53.8 ?56.0         EMG       EMG Summary Table    Spontaneous MUAP Recruitment  Muscle IA Fib PSW Fasc Other Amp Dur. Poly Pattern  L. Tibialis anterior Normal None None None _______ Normal Normal Normal Normal  L. Tibialis posterior Normal None None None _______ Normal Normal Normal Normal  L. Peroneus longus Normal None None None _______ Normal Normal Normal Normal  L. Vastus lateralis Normal None None None _______ Normal Normal Normal Normal  L. Abductor hallucis Normal None None None _______ Normal Normal Normal Normal  R. Tibialis anterior Normal None None None _______ Normal Normal Normal Normal  R. Tibialis posterior Normal None None None _______ Normal Normal Normal Normal  R. Peroneus longus Normal None None None _______ Normal Normal Normal Normal  R. Gastrocnemius (Medial head) Normal None None None _______ Normal Normal Normal Normal  R. Vastus lateralis Normal None None None _______ Normal Normal Normal Normal  R. Lumbar paraspinals (mid) Normal None None None _______ Normal Normal Normal Normal  R. Lumbar paraspinals (low) Normal None None None _______ Normal Normal Normal Normal  L. Lumbar paraspinals (mid) Normal None None None _______ Normal Normal Normal Normal  L. Lumbar paraspinals (low) Normal None None None _______ Normal Normal Normal Normal

## 2019-01-21 NOTE — Telephone Encounter (Signed)
Pt called in and stated she still feel as if she is being shocked and wants to know why she is feeling this , she states she is taking her Lyrica as prescribed. Shes been staying with her sister for 2 nights due to loosing power.She has been sleeping in a hospital bed at her sisters and she states the pain only occurs at night and her arms and legs feel weak in there mornings when she wakes up.

## 2019-01-21 NOTE — Telephone Encounter (Signed)
I spoke to the patient.  She is currently taking Lyrica 200mg  TID.  The last few days, she has noticed an increase in shocking pain in her arms/legs.  The symptoms are worse during the night and wake her up from sleep.  She never has symptoms during the day.

## 2019-01-23 ENCOUNTER — Telehealth: Payer: Self-pay | Admitting: Gastroenterology

## 2019-01-23 NOTE — Telephone Encounter (Signed)
Pt said she was having knee surgery and would need to cancel her procedure with SF on 12/1. She said that she would call back in January 2021 to reschedule.

## 2019-01-23 NOTE — Telephone Encounter (Signed)
Called endo and cancelled. FYI to AB

## 2019-01-26 NOTE — H&P (Signed)
ORTHOPAEDIC HISTORY & PHYSICAL  Progress Notes by Lamar Benes., MD at 01/08/2019 8:45 AM  Chief Complaint:     Chief Complaint  Patient presents with  . Knee Pain    Left knee meniscus tear, MRI 12/18/18    Reason for Visit: The patient is a 58 y.o. female who presents today for reevaluation of her left knee. She reports a 1 year(s) history of intermittent left knee pain.  She localizes most of the pain along the medial aspect of the knee. She reports some swelling, no locking, and some giving way of the knee. The pain is aggravated by lateral movements, pivoting and squatting. The patient has not appreciated any significant improvement despite activity modification, intraarticular corticosteroid injections, NSAIDs, and topical NSAIDs.   Medications: Current Medications        Current Outpatient Medications  Medication Sig Dispense Refill  . ACCU-CHEK AVIVA PLUS TEST STRP test strip     . ACCU-CHEK SOFTCLIX LANCETS lancets     . amoxicillin (AMOXIL) 500 MG capsule Take by mouth    . aspirin 81 MG EC tablet Take 81 mg by mouth once daily    . buPROPion (WELLBUTRIN SR) 100 MG SR tablet Take by mouth Take 1 tablet (100 mg total) by mouth daily.    . canagliflozin (INVOKANA) 300 mg Tab Take 300 mg by mouth once daily    . clonazePAM (KLONOPIN) 0.5 MG tablet Take 0.5 mg by mouth 2 (two) times daily    . diclofenac (VOLTAREN) 1 % topical gel     . dulaglutide (TRULICITY) 1.5 99991111 mL subcutaneous injection Inject 1.5 mg subcutaneously every 7 (seven) days    . escitalopram oxalate (LEXAPRO) 10 MG tablet     . fluticasone propionate (FLONASE) 50 mcg/actuation nasal spray     . glipiZIDE (GLUCOTROL) 5 MG tablet Take 5 mg by mouth every morning before breakfast      . ibuprofen (IBU) 800 MG tablet Take 1 tablet by mouth as needed    . levocetirizine (XYZAL) 5 MG tablet     . levothyroxine (SYNTHROID, LEVOTHROID) 150 MCG tablet Take 150 mcg  by mouth once daily Take on an empty stomach with a glass of water at least 30-60 minutes before breakfast.    . LINZESS 145 mcg capsule     . metFORMIN (GLUCOPHAGE-XR) 500 MG XR tablet Take 4 tablets by mouth once daily    . methocarbamol (ROBAXIN) 500 MG tablet Take 500 mg by mouth 4 (four) times daily    . methylphenidate HCl (RITALIN) 10 MG tablet Take by mouth    . multivitamin tablet Take 1 tablet by mouth once daily.    . naloxegol (MOVANTIK) tablet Take by mouth Take 25 mg by mouth daily.    Marland Kitchen olopatadine (PATADAY) 0.2 % ophthalmic solution     . oxyCODONE (DAZIDOX) 10 mg immediate release tablet Take 10 mg by mouth 3 (three) times daily       . pantoprazole (PROTONIX) 40 MG DR tablet Take by mouth Take 40 mg by mouth daily before breakfast.    . pioglitazone (ACTOS) 15 MG tablet Take by mouth Take 15 mg by mouth daily.    . pregabalin (LYRICA) 200 MG capsule Take 200 mg by mouth 3 (three) times daily    . propylene glycol (SYSTANE COMPLETE OPHTH) Apply 1 drop to eye as needed    . QUEtiapine (SEROQUEL) 200 MG tablet Take 200 mg by mouth nightly    .  RESTASIS 0.05 % ophthalmic emulsion     . simvastatin (ZOCOR) 40 MG tablet Take 40 mg by mouth nightly.    . SUMAtriptan (IMITREX) 100 MG tablet Take 100 mg by mouth once as needed for Migraine. May take a second dose after 2 hours if needed.    Marland Kitchen SYMPROIC 0.2 mg Tab      No current facility-administered medications for this visit.       Allergies: No Known Allergies  Past Medical History:     Past Medical History:  Diagnosis Date  . ADHD   . Arthritis   . Bipolar disorder, unspecified (CMS-HCC)   . Depression   . Diabetes mellitus type 2, uncomplicated (CMS-HCC)   . Dry eye syndrome of unspecified lacrimal gland   . Ectopic pregnancy 1982  . Hyperlipemia   . Insomnia   . Migraines   . Miscarriage 1987  . Neuromuscular disorder (CMS-HCC)   . Obesity, morbid, BMI  40.0-49.9 (CMS-HCC)   . PTSD (post-traumatic stress disorder)   . Thyroid disease     Past Surgical History:      Past Surgical History:  Procedure Laterality Date  . CHOLECYSTECTOMY    . ENDOSCOPIC CARPAL TUNNEL RELEASE Bilateral   . HYSTERECTOMY    . knee surgery    . Right total knee arthroplasty using computer-assisted navigation  08/28/2017   Dr Marry Guan    Social History: Social History  Social History        Socioeconomic History  . Marital status: Divorced    Spouse name: Not on file  . Number of children: 1  . Years of education: 50  . Highest education level: High school graduate  Occupational History  . Occupation: Disabled  Social Needs  . Financial resource strain: Not on file  . Food insecurity    Worry: Not on file    Inability: Not on file  . Transportation needs    Medical: Not on file    Non-medical: Not on file  Tobacco Use  . Smoking status: Current Some Day Smoker    Packs/day: 1.00    Years: 8.00    Pack years: 8.00    Types: Cigarettes  . Smokeless tobacco: Never Used  Substance and Sexual Activity  . Alcohol use: No  . Drug use: No  . Sexual activity: Defer    Partners: Male  Lifestyle  . Physical activity    Days per week: Not on file    Minutes per session: Not on file  . Stress: Not on file  Relationships  . Social Herbalist on phone: Not on file    Gets together: Not on file    Attends religious service: Not on file    Active member of club or organization: Not on file    Attends meetings of clubs or organizations: Not on file    Relationship status: Not on file  Other Topics Concern  . Not on file  Social History Narrative  . Not on file      Family History:      Family History  Problem Relation Age of Onset  . Stroke Mother   . High blood pressure (Hypertension) Mother   . Diabetes Mother   . Arthritis Mother   . Cancer Father   . Diabetes  Sister   . Hypercoagulable state (Tendency to form frequent blood clots) Sister   . Stroke Sister   . Diabetes Brother   . Cancer Maternal Grandmother   .  Heart disease Paternal Grandmother   . Heart disease Paternal Grandfather     Review of Systems: A comprehensive 14 point ROS was performed, reviewed, and the pertinent orthopaedic findings are documented in the HPI.  Exam BP 112/60   Temp 36.1 C (97 F) (Skin)   Ht 167.6 cm (5\' 6" )   Wt 95.1 kg (209 lb 9.6 oz)   BMI 33.83 kg/m   General:  Well-developed, well-nourished female seen in no acute distress.  Antalgic gait.  No varus or valgus thrust to the left knee.  HEENT:  Atraumatic, normocephalic.  Pupils are equal and reactive to light.  Extraocular motion is intact. Sclera are clear.  Oropharynx is clear with moist mucosa.  Lungs:  Clear to auscultation bilaterally.  Cardiovascular: Regular rate and rhythm.  Normal S1, S2.  No murmur .  No appreciable gallops or rubs. Peripheral pulses are palpable.  No lower extremity edema.  Homan`s test is negative.   Extremities: Good strength, stability, and range of motion of the upper extremities. Good range of motion of the hips and ankles.  Left Knee:          Soft tissue swelling: minimal    Effusion:                   minimal    Erythema:                 none    Crepitance:               minimal    Tenderness:             medial    Alignment:                normal    Mediolateral laxity:   stable    Anterior drawer test:negative    Lachman`s test:       negative    McMurray`s test:      positive    Atrophy:                    No significant atrophy.                                       Quadriceps tone was fair to good.    Range of Motion:     Greater than 110 degrees  Neurologic:  Awake, alert, and oriented.  Sensory function is intact to pinprick and light touch.   Motor strength is judged to be 5/5.   Motor coordination is within normal  limits.   No apparent clonus. No tremor.    MRI: I reviewed the left knee MRI from [facility] dated 12/18/2018.  I concur with the radiologist's interpretation as below:  MRI OF THE LEFT KNEE WITHOUT CONTRAST  TECHNIQUE: Multiplanar, multisequence MR imaging of the knee was performed. No intravenous contrast was administered.  COMPARISON: Radiographs 02/19/2015. Left knee MRI 01/27/2009.  FINDINGS: MENISCI  Medial meniscus: There is mildly progressive peripheral extrusion of the meniscus from the joint related to the previously demonstrated radial tear of the meniscal root. No centrally displaced meniscal fragment.  Lateral meniscus: Intact with normal morphology.  LIGAMENTS  Cruciates: Intact.  Collaterals: Intact.  CARTILAGE  Patellofemoral: Mildly progressive degenerative changes with chondral thinning, surface irregularity and osteophyte formation.  Lateral: Preserved.  MISCELLANEOUS  Joint: No significant joint effusion.  Popliteal Fossa: Tiny Baker's cyst. Otherwise unremarkable.  Extensor Mechanism: Intact.  Bones: No acute or significant extra-articular osseous findings.  Other: No other significant periarticular soft tissue findings.  IMPRESSION: 1. 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 2. Associated progressive medial compartment osteoarthritis. Mild patellofemoral degenerative changes have also progressed. 3. Intact lateral meniscus, cruciate and collateral ligaments.  Electronically Signed  By: Richardean Sale M.D.  On: 12/18/2018 12:46   Impression: Internal derangement of the left knee  Plan:   The findings were discussed in detail with the patient. The patient was given informational material on knee arthroscopy. Conservative treatment options were reviewed with the patient.  We  discussed the risks and benefits of surgical intervention. Arthroscopy is an appropriate treatment for the meniscal pathology, but would have limited or no effect on degenerative changes of the articular cartilage. The usual perioperative course was also discussed in detail.  The patient expressed understanding of the risks and benefits of surgical intervention and would like to proceed with plans for left knee arthroscopy.  MEDICAL CLEARANCE: Per anesthesiology. ACTIVITIES:  Avoid pivoting, squatting, or twisting. WORK STATUS: Not applicable. THERAPY: Quadriceps strengthening exercises. MEDICATIONS: Requested Prescriptions    No prescriptions requested or ordered in this encounter   FOLLOW-UP: Return for preop History & Physical pending surgery date.     James P. Holley Bouche., M.D.  This note was generated in part with voice recognition software and I apologize for any typographical errors that were not detected and corrected.

## 2019-01-27 NOTE — Progress Notes (Signed)
PATIENT: [name] DOB: [birth year]  REASON FOR VISIT: follow up HISTORY FROM: patient  HISTORY OF PRESENT ILLNESS: Today 01/28/19  HISTORY [name] is a 58 year old female, seen in request by her primary care physician Dr. Asencion Noble, for evaluation of peripheral neuropathy, initial evaluation was through virtual visit on September 18, 2018.  I have reviewed and summarized the referring note from the referring physician.  She had past medical history of hypothyroidism, diabetes since 2016  She presented with bilateral feet paresthesia since 2019, initially involving her toes, and top of her feet, is getting worse with ambulating, now his work is way up to ankle levels now, also began to have fingertips paresthesia,  She had a history of knee replacement, denies significant gait abnormality, no bowel and bladder incontinence,  Previously she was treated with gabapentin, which is no longer helpful, recently switched to Lyrica 75 mg twice a day, which was helpful, she denies significant side effect  She also complains of chronic neck, low back pain, radiating pain to left buttock, left lower extremity, radiating pain to bilateral shoulder and upper extremity.  Update January 28, 2019 SS: After her initial visit, she was started on Lyrica 100 mg 3 times a day, was subsequently increased after multiple telephone calls, to 200 mg 3 times a day.  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  No evidence of bilateral lumbosacral radiculopathy.  She reports that the paresthesias have improved with Lyrica.  She is waking up once per week in the middle of the night with shocklike pains to both forearms, lateral thighs from her hips to her knees.  This sensation is very localized.  She will have to rub the area, the next day it is sore to touch.  The paresthesia is not as frequent with the addition of  Lyrica, when it occurs she has to take an additional Lyrica.  Recent A1c was 5.9, 1 month ago.  A few months ago, she developed neck pain, is seen by Dr. Maryjean Ka, received an injection in her neck, no longer has neck pain.  The neck pain did not coincide with the sensation of paresthesia.  She has not had any falls, she denies any changes to her bowels or bladder.  She has chronic back pain and sciatica, also fibromyalgia.  During the night, when the sensation occurs, the areas are very sensitive to touch.  She takes Robaxin before bed.  She has a torn left knee meniscus, scheduled for surgery in a few weeks.  REVIEW OF SYSTEMS: Out of a complete 14 system review of symptoms, the patient complains only of the following symptoms, and all other reviewed systems are negative.  Paresthesia  ALLERGIES: No Known Allergies  HOME MEDICATIONS: Outpatient Medications Prior to Visit  Medication Sig Dispense Refill  . amoxicillin (AMOXIL) 500 MG capsule Take 2,000 mg by mouth See admin instructions. Take 2000 mg by mouth 1 hour prior to dental appointment    . aspirin EC 81 MG tablet Take 81 mg by mouth daily.     Marland Kitchen buPROPion (WELLBUTRIN SR) 100 MG 12 hr tablet Take 1 tablet (100 mg total) by mouth 2 (two) times daily. 180 tablet 0  . canagliflozin (INVOKANA) 300 MG TABS tablet Take 300 mg by mouth daily. 30 tablet   . clonazePAM (KLONOPIN) 0.5 MG tablet Take 1 tablet (0.5 mg total) by mouth 3 (three) times daily as needed for anxiety. (Patient  taking differently: Take 0.5 mg by mouth 2 (two) times daily. ) 90 tablet 2  . diclofenac sodium (VOLTAREN) 1 % GEL Apply 2 g topically 4 (four) times daily as needed (joint pain).    . Dulaglutide (TRULICITY) 1.5 0000000 SOPN Inject 1.5 mg into the skin every Saturday.     . fluticasone (FLONASE) 50 MCG/ACT nasal spray Place 1 spray into both nostrils daily as needed for allergies.     Marland Kitchen glipiZIDE (GLUCOTROL XL) 5 MG 24 hr tablet Take 5 mg by mouth daily with  breakfast.    . ibuprofen (IBU) 800 MG tablet Take 800 mg by mouth every 8 (eight) hours as needed for moderate pain.     Marland Kitchen levocetirizine (XYZAL) 5 MG tablet Take 5 mg by mouth daily.    Marland Kitchen levothyroxine (SYNTHROID, LEVOTHROID) 150 MCG tablet Take 150 mcg by mouth daily before breakfast.    . LINZESS 145 MCG CAPS capsule Take 145 mcg by mouth daily before breakfast.     . metFORMIN (GLUCOPHAGE-XR) 500 MG 24 hr tablet Take 1,000 mg by mouth 2 (two) times daily with a meal.     . methocarbamol (ROBAXIN) 500 MG tablet Take 1 tablet (500 mg total) by mouth 4 (four) times daily. (Patient taking differently: Take 500 mg by mouth 2 (two) times daily. ) 90 tablet 5  . methylphenidate (RITALIN) 10 MG tablet Take 1 tablet (10 mg total) by mouth 2 (two) times daily. 60 tablet 0  . nitrofurantoin, macrocrystal-monohydrate, (MACROBID) 100 MG capsule Take 100 mg by mouth 2 (two) times daily.    . Olopatadine HCl 0.2 % SOLN SMARTSIG:1 Drop(s) In Eye(s) Twice Daily PRN    . Oxycodone HCl 10 MG TABS Take 10 mg by mouth 3 (three) times daily.     . pantoprazole (PROTONIX) 40 MG tablet Take 40 mg by mouth daily before breakfast.    . pioglitazone (ACTOS) 15 MG tablet Take 15 mg by mouth daily.    . pregabalin (LYRICA) 200 MG capsule Take 1 capsule (200 mg total) by mouth 3 (three) times daily. 90 capsule 5  . QUEtiapine (SEROQUEL) 200 MG tablet Take 1 tablet (200 mg total) by mouth at bedtime. 90 tablet 0  . RESTASIS 0.05 % ophthalmic emulsion Place 1 drop into both eyes every morning.    . simvastatin (ZOCOR) 40 MG tablet Take 1 tablet (40 mg total) by mouth daily. (Patient taking differently: Take 40 mg by mouth at bedtime. ) 30 tablet   . SUMAtriptan (IMITREX) 100 MG tablet One tablet po prn migraine.  May repeat in 2 hrs if needed.  Do not exceed 200 mg per 24 hrs. (Patient taking differently: Take 100 mg by mouth every 2 (two) hours as needed for migraine. One tablet po prn migraine.  May repeat in 2 hrs if  needed.  Do not exceed 200 mg per 24 hrs.) 5 tablet 0  . escitalopram (LEXAPRO) 10 MG tablet Take 1 tablet (10 mg total) by mouth daily. 90 tablet 0  . escitalopram (LEXAPRO) 10 MG tablet     . methylphenidate (RITALIN) 10 MG tablet Take 1 tablet (10 mg total) by mouth 2 (two) times daily with breakfast and lunch. (Patient not taking: Reported on 01/28/2019) 60 tablet 0  . Oxycodone HCl 10 MG TABS Take by mouth.    . polyethylene glycol-electrolytes (TRILYTE) 420 g solution Take 4,000 mLs by mouth as directed. (Patient not taking: Reported on 01/28/2019) 4000 mL 0  . pregabalin (  LYRICA) 200 MG capsule Take by mouth.     No facility-administered medications prior to visit.     PAST MEDICAL HISTORY: Past Medical History:  Diagnosis Date  . Anxiety   . Bipolar disorder ([place])   . Complication of anesthesia   . Degenerative disc disease, lumbar   . Depression   . Diabetes mellitus   . Elevated cholesterol   . Fibromyalgia   . GERD (gastroesophageal reflux disease)   . Hypothyroidism   . Migraine   . Neuropathy   . Osteoarthritis   . PONV (postoperative nausea and vomiting)   . PTSD (post-traumatic stress disorder)    after death of son (accidental overdose)  . Thyroid disease     PAST SURGICAL HISTORY: Past Surgical History:  Procedure Laterality Date  . BILATERAL OOPHORECTOMY    . CARPAL TUNNEL RELEASE  bilateral  . CHOLECYSTECTOMY    . COLONOSCOPY WITH PROPOFOL N/A 05/01/2012   [id number] COLON polyps/Mild diverticulosis was noted in the sigmoid colon/Moderate sized internal hemorrhoids. path with tubular adenoma  . gallbladder    . KNEE ARTHROPLASTY Right 08/28/2017   Procedure: COMPUTER ASSISTED TOTAL KNEE ARTHROPLASTY;  Surgeon: Dereck Leep, MD;  Location: [facility] ORS;  Service: Orthopedics;  Laterality: Right;  . KNEE ARTHROSCOPY     bilateral  . KNEE ARTHROSCOPY WITH MEDIAL MENISECTOMY Right 10/29/2015   Procedure: ARTHROSCOPY RIGHT KNEE  WITH PARTIAL MEDIAL MENISECTOMY;   Surgeon: Carole Civil, MD;  Location: [facility];  Service: Orthopedics;  Laterality: Right;  . POLYPECTOMY N/A 05/01/2012   Procedure: POLYPECTOMY;  Surgeon: Danie Binder, MD;  Location: [facility];  Service: Endoscopy;  Laterality: N/A;  . SHOULDER SURGERY  left  . VAGINAL HYSTERECTOMY      FAMILY HISTORY: Family History  Problem Relation Age of Onset  . Cancer Other   . Diabetes Other   . Heart disease Other   . Arthritis Other   . Stroke Mother   . Anxiety disorder Mother   . Heart failure Mother   . Depression Mother   . Cancer - Lung Father   . Lung disease Father   . Colon cancer Father        father, diagnosed in his late 19s   . Heart failure Sister   . Depression Sister   . Alcohol abuse Brother   . Lung disease Other   . Heart disease Maternal Grandfather   . Stroke Paternal Grandmother   . Drug abuse Other   . Drug abuse Son   . Drug abuse Son     SOCIAL HISTORY: Social History   Socioeconomic History  . Marital status: Single    Spouse name: Not on file  . Number of children: 2  . Years of education: 38  . Highest education level: High school graduate  Occupational History  . Occupation: unemployed    Fish farm manager: UNEMPLOYED  Social Needs  . Financial resource strain: Not on file  . Food insecurity    Worry: Not on file    Inability: Not on file  . Transportation needs    Medical: Not on file    Non-medical: Not on file  Tobacco Use  . Smoking status: Current Every Day Smoker    Packs/day: 1.00    Years: 13.00    Pack years: 13.00    Types: Cigarettes  . Smokeless tobacco: Never Used  Substance and Sexual Activity  . Alcohol use: No  . Drug use: No  . Sexual activity: Not  Currently    Birth control/protection: Surgical  Lifestyle  . Physical activity    Days per week: Not on file    Minutes per session: Not on file  . Stress: Not on file  Relationships  . Social Herbalist on phone: Not on file    Gets together: Not on file     Attends religious service: Not on file    Active member of club or organization: Not on file    Attends meetings of clubs or organizations: Not on file    Relationship status: Not on file  . Intimate partner violence    Fear of current or ex partner: Not on file    Emotionally abused: Not on file    Physically abused: Not on file    Forced sexual activity: Not on file  Other Topics Concern  . Not on file  Social History Narrative   Lives at home with her boyfriend and her son.   Right-handed.   No caffeine use.   Two children - one living, one deceased.    PHYSICAL EXAM  Vitals:   01/28/19 1109  BP: 112/61  Pulse: 68  Temp: (!) 97.3 F (36.3 C)  Weight: 215 lb (97.5 kg)   Body mass index is 34.7 kg/m.  Generalized: Well developed, in no acute distress   Neurological examination  Mentation: Alert oriented to time, place, history taking. Follows all commands speech and language fluent Cranial nerve II-XII: Pupils were equal round reactive to light. Extraocular movements were full, visual field were full on confrontational test. Facial sensation and strength were normal.  Head turning and shoulder shrug  were normal and symmetric. Motor: The motor testing reveals 5 over 5 strength of all 4 extremities. Good symmetric motor tone is noted throughout.  Sensory: Sensory testing is intact to soft touch on all 4 extremities. No evidence of extinction is noted.  Coordination: Cerebellar testing reveals good finger-nose-finger and heel-to-shin bilaterally.  Gait and station: Gait is normal. Tandem gait is normal, using a cane due to knee injury left Reflexes: Deep tendon reflexes are symmetric  DIAGNOSTIC DATA (LABS, IMAGING, TESTING) - I reviewed patient records, labs, notes, testing and imaging myself where available.  Lab Results  Component Value Date   WBC 8.0 10/10/2017   HGB 14.7 10/10/2017   HCT 43.8 10/10/2017   MCV 94.8 10/10/2017   PLT 283 10/10/2017       Component Value Date/Time   NA 136 11/28/2018 0905   K 4.2 11/28/2018 0905   CL 102 11/28/2018 0905   CO2 26 11/28/2018 0905   GLUCOSE 161 (H) 11/28/2018 0905   BUN 30 (H) 11/28/2018 0905   CREATININE 0.73 11/28/2018 0905   CALCIUM 8.8 (L) 11/28/2018 0905   CALCIUM 9.9 04/19/2012 1459   PROT 7.8 08/16/2017 0927   PROT 7.5 04/19/2012 1459   ALBUMIN 4.3 08/16/2017 0927   ALBUMIN 4.8 04/19/2012 1459   AST 47 (H) 08/16/2017 0927   AST 38 04/19/2012 1459   ALT 53 08/16/2017 0927   ALKPHOS 90 08/16/2017 0927   ALKPHOS 64 04/19/2012 1459   BILITOT 0.5 08/16/2017 0927   BILITOT 0.5 04/19/2012 1459   GFRNONAA >60 11/28/2018 0905   GFRAA >60 11/28/2018 0905   No results found for: CHOL, HDL, LDLCALC, LDLDIRECT, TRIG, CHOLHDL Lab Results  Component Value Date   HGBA1C 7.6 (H) 08/16/2017   No results found for: VITAMINB12 No results found for: TSH  ASSESSMENT AND PLAN 57  y.o. year old female  has a past medical history of Anxiety, Bipolar disorder ([place]), Complication of anesthesia, Degenerative disc disease, lumbar, Depression, Diabetes mellitus, Elevated cholesterol, Fibromyalgia, GERD (gastroesophageal reflux disease), Hypothyroidism, Migraine, Neuropathy, Osteoarthritis, PONV (postoperative nausea and vomiting), PTSD (post-traumatic stress disorder), and Thyroid disease. here with:  1.  Paresthesia, shocklike pain to both forearms, lateral thighs only during the night -EMG was mildly abnormal showed evidence of mild length dependent axonal sensorimotor polyneuropathy, no evidence of bilateral lumbosacral radiculopathy -Recent A1c revealed excellent control, 5.9 -Continue Lyrica 200 mg 3 times a day, at max dose  -Start Cymbalta 30 mg at bedtime, is no longer taking Lexapro -Will get most recent lab work from Dr. Willey Blade, will check CRP, folate, IFE and PE serum, RPR, sed rate, B12 -Once labs result, will determine if any further imaging needs to be conducted as the sensation is very  localized -Follow-up in 4 months or sooner if needed  I spent 25 minutes with the patient. 50% of this time was spent discussing her plan of care.  Butler Denmark, AGNP-C, DNP 01/28/2019, 11:53 AM [facility] [address] [place], [place] [address] [phone]

## 2019-01-28 ENCOUNTER — Ambulatory Visit (INDEPENDENT_AMBULATORY_CARE_PROVIDER_SITE_OTHER): Payer: Medicare HMO | Admitting: Neurology

## 2019-01-28 ENCOUNTER — Other Ambulatory Visit: Payer: Self-pay

## 2019-01-28 ENCOUNTER — Encounter: Payer: Self-pay | Admitting: Neurology

## 2019-01-28 VITALS — BP 112/61 | HR 68 | Temp 97.3°F | Wt 215.0 lb

## 2019-01-28 DIAGNOSIS — G6289 Other specified polyneuropathies: Secondary | ICD-10-CM

## 2019-01-28 DIAGNOSIS — R202 Paresthesia of skin: Secondary | ICD-10-CM

## 2019-01-28 MED ORDER — DULOXETINE HCL 30 MG PO CPEP: 30.0000 mg | ORAL_CAPSULE | Freq: Every day | ORAL | 3 refills | Status: DC

## 2019-01-28 NOTE — Patient Instructions (Addendum)
I will check lab work today. You may continue Lyrica. I will get recent lab work from Dr. Willey Blade. Since you are not taking Lexapro, start Cymbalta 30 mg at bedtime to see if it helps with your symptoms.

## 2019-01-30 ENCOUNTER — Telehealth (HOSPITAL_COMMUNITY): Payer: Self-pay | Admitting: *Deleted

## 2019-01-30 ENCOUNTER — Other Ambulatory Visit: Payer: Self-pay

## 2019-01-30 ENCOUNTER — Encounter (HOSPITAL_COMMUNITY): Payer: Self-pay

## 2019-01-30 ENCOUNTER — Other Ambulatory Visit (HOSPITAL_COMMUNITY): Payer: Self-pay | Admitting: Psychiatry

## 2019-01-30 ENCOUNTER — Ambulatory Visit (HOSPITAL_COMMUNITY)
Admission: EM | Admit: 2019-01-30 | Discharge: 2019-01-30 | Disposition: A | Discharge status: Home / Self Care | Payer: Medicare HMO | Attending: Emergency Medicine | Admitting: Emergency Medicine

## 2019-01-30 DIAGNOSIS — G8929 Other chronic pain: Secondary | ICD-10-CM

## 2019-01-30 DIAGNOSIS — M5441 Lumbago with sciatica, right side: Secondary | ICD-10-CM

## 2019-01-30 LAB — MULTIPLE MYELOMA PANEL, SERUM
Albumin SerPl Elph-Mcnc: 3.7 g/dL (ref 2.9–4.4)
Albumin/Glob SerPl: 1.4 (ref 0.7–1.7)
Alpha 1: 0.2 g/dL (ref 0.0–0.4)
Alpha2 Glob SerPl Elph-Mcnc: 0.7 g/dL (ref 0.4–1.0)
B-Globulin SerPl Elph-Mcnc: 1 g/dL (ref 0.7–1.3)
Gamma Glob SerPl Elph-Mcnc: 0.8 g/dL (ref 0.4–1.8)
Globulin, Total: 2.8 g/dL (ref 2.2–3.9)
IgA/Immunoglobulin A, Serum: 183 mg/dL (ref 87–352)
IgG (Immunoglobin G), Serum: 914 mg/dL (ref 586–1602)
IgM (Immunoglobulin M), Srm: 86 mg/dL (ref 26–217)
Total Protein: 6.5 g/dL (ref 6.0–8.5)

## 2019-01-30 LAB — VITAMIN B12: Vitamin B-12: 611 pg/mL (ref 232–1245)

## 2019-01-30 LAB — C-REACTIVE PROTEIN: CRP: 1 mg/L (ref 0–10)

## 2019-01-30 LAB — FOLATE: Folate: 3.8 ng/mL (ref >3.0)

## 2019-01-30 LAB — SEDIMENTATION RATE: Sed Rate: 10 mm/hr (ref 0–40)

## 2019-01-30 LAB — RPR: RPR Ser Ql: NONREACTIVE

## 2019-01-30 MED ORDER — CYCLOBENZAPRINE HCL 5 MG PO TABS: 5.0000 mg | ORAL_TABLET | Freq: Three times a day (TID) | ORAL | 0 refills | Status: DC | PRN

## 2019-01-30 MED ORDER — PREDNISONE 10 MG (21) PO TBPK: ORAL_TABLET | Freq: Every day | ORAL | 0 refills | Status: DC

## 2019-01-30 MED ORDER — METHYLPREDNISOLONE ACETATE 40 MG/ML IJ SUSP
80.0000 mg | Freq: Once | INTRAMUSCULAR | Status: AC
  Administered 2019-01-30: 80 mg via INTRAMUSCULAR

## 2019-01-30 MED ORDER — HYDROCODONE-ACETAMINOPHEN 5-325 MG PO TABS
1.0000 | ORAL_TABLET | Freq: Once | ORAL | Status: AC
  Administered 2019-01-30: 1 via ORAL

## 2019-01-30 MED ORDER — CLONAZEPAM 0.5 MG PO TABS: 0.5000 mg | ORAL_TABLET | Freq: Three times a day (TID) | ORAL | 0 refills | Status: DC | PRN

## 2019-01-30 MED ORDER — METHYLPREDNISOLONE ACETATE 80 MG/ML IJ SUSP
INTRAMUSCULAR | Status: AC
  Filled 2019-01-30: qty 1

## 2019-01-30 MED ORDER — HYDROCODONE-ACETAMINOPHEN 5-325 MG PO TABS
ORAL_TABLET | ORAL | Status: AC
  Filled 2019-01-30: qty 1

## 2019-01-30 NOTE — Discharge Instructions (Addendum)
We will try switching your muscle relaxer. Don't take your Methocarbamol and Flexeril. Flexeril as a muscle relaxer up to three times a day as needed, may cause drowsiness.  Also have provided a steroid pack, please monitor your blood sugars as this can elevate your blood sugar.  Light and regular activity as tolerated.  See exercises provided.  Heat application while active can help with muscle spasms.  Sleep with pillow under your knees.   Please follow up with your neurologist for recheck.  Any worsening of symptoms- increased pain, weakness, numbness tingling or change in sensation, loss of bladder or bowel control, please go to the ER.

## 2019-01-30 NOTE — ED Triage Notes (Signed)
Pt states she has lower back pain that is radiating down her right leg this pain started last night around 7 pm.  Pt states she has taken muscle relaxes and aleve.pt states the meds did not work.

## 2019-01-30 NOTE — Telephone Encounter (Signed)
ordered

## 2019-01-30 NOTE — Telephone Encounter (Signed)
PATIENT CALLED TO SCHEDULE APPT 02/01/2019 & REFILL REQUEST CLONAZEPAM

## 2019-01-30 NOTE — ED Provider Notes (Signed)
[place]    CSN: [id number] Arrival date & time: 01/30/19  0802      History   Chief Complaint Chief Complaint  Patient presents with  . Back Pain    HPI [name] is a 58 y.o. female.   [name] presents with complaints of low back pain which is radiating to right hip and thigh. Started last night around 7p. She was unable to sleep, laying flat worsens the pain, she had to sleep in her recliner. Pain with raising of the right leg. No injury. No increased activity or known trigger. History of chronic back pain and sciatica, typically her pain is to left low back, however. Last MRI of lumbar spine was 05/2017 with degenerative spondylolysis visualized. Follows with neurology for lower extremity parasthesias, was seen there on 11/9, started on cymbalta. She takes lyrica at baseline. Also uses robaxin chronically for her back. History  Of fibromyalgia as well. Has had nerve conduction studies completed with some mild abnormalities. Diabetic, most recent a1c of 5.9. she is also scheduled to have meniscus surgery to left knee next month. No new bladder or bowel incontinence, states she was unable to make to restroom in time once as pain limited her ability to get up and ambulate quickly enough, but denies  Any loss of control. She took aleve last at 0400 this am which didn't seem to help. Hasn't taken any robaxin yet today.    ROS per HPI, negative if not otherwise mentioned.      Past Medical History:  Diagnosis Date  . Anxiety   . Bipolar disorder ([place])   . Complication of anesthesia   . Degenerative disc disease, lumbar   . Depression   . Diabetes mellitus   . Elevated cholesterol   . Fibromyalgia   . GERD (gastroesophageal reflux disease)   . Hypothyroidism   . Migraine   . Neuropathy   . Osteoarthritis   . PONV (postoperative nausea and vomiting)   . PTSD (post-traumatic stress disorder)    after death of son (accidental overdose)  . Thyroid  disease     Patient Active Problem List   Diagnosis Date Noted  . Paresthesia 01/28/2019  . Peripheral neuropathy 09/18/2018  . MDD (major depressive disorder), recurrent episode, mild ([place]) 07/18/2018  . Diarrhea 04/04/2018  . S/P total knee arthroplasty 08/28/2017  . 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 Palpitations 06/01/2016  . Hyperlipidemia 06/01/2016  . Tobacco abuse 06/01/2016  . Dyspnea on exertion 05/16/2016  . Left leg swelling 05/16/2016  . Diabetes mellitus ([place]) 05/16/2016  . Chest pain   . Swelling   . Acute medial meniscus tear of left knee   . Post traumatic stress disorder (PTSD) 11/08/2014  . MDD (major depressive disorder), recurrent severe, without psychosis ([place]) 11/08/2014  . 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 Panic disorder 11/08/2014  . Chronic pain syndrome 11/21/2013  . Primary osteoarthritis of both knees 11/21/2013  . Spinal stenosis of lumbar region 11/21/2013  . Difficulty in walking(719.7) 10/14/2013  . Sciatica associated with disorder of lumbar spine 09/17/2013  . Lower back pain 08/08/2013  . Lateral meniscus, posterior horn derangement 06/13/2013  . Arthritis of right knee 06/13/2013  . S/P medial meniscectomy of right knee 06/13/2013  . Left shoulder pain 08/28/2012  . Rotator cuff syndrome of left shoulder 08/28/2012  . Rotator cuff tear 08/28/2012  . Pes anserinus bursitis 05/16/2012  .  Encounter for screening colonoscopy 04/16/2012  . Fatty liver 04/16/2012  . Back pain 07/26/2011  . Knee bursitis, left 07/26/2011  . OA (osteoarthritis) of knee 07/26/2011  . Spinal stenosis 10/28/2010  . Medial meniscus, posterior horn derangement 08/12/2010  . Knee pain 08/04/2010  . Sciatica 08/04/2010  . H N P-LUMBAR 01/14/2010  . 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 LUMBOSACRAL STRAIN 05/12/2009  . DERANGEMENT OF POSTERIOR HORN  OF MEDIAL MENISCUS 01/19/2009  . BACK PAIN 01/19/2009  . LOWER LEG, ARTHRITIS, DEGEN./OSTEO 12/02/2008  . JOINT EFFUSION, KNEE 11/10/2008  . ANSERINE BURSITIS, RIGHT 11/10/2008  . DERANGEMENT MENISCUS 10/15/2008  . KNEE PAIN 10/15/2008    Past Surgical History:  Procedure Laterality Date  . BILATERAL OOPHORECTOMY    . CARPAL TUNNEL RELEASE  bilateral  . CHOLECYSTECTOMY    . COLONOSCOPY WITH PROPOFOL N/A 05/01/2012   [id number] COLON polyps/Mild diverticulosis was noted in the sigmoid colon/Moderate sized internal hemorrhoids. path with tubular adenoma  . gallbladder    . KNEE ARTHROPLASTY Right 08/28/2017   Procedure: COMPUTER ASSISTED TOTAL KNEE ARTHROPLASTY;  Surgeon: Dereck Leep, MD;  Location: [facility] ORS;  Service: Orthopedics;  Laterality: Right;  . KNEE ARTHROSCOPY     bilateral  . KNEE ARTHROSCOPY WITH MEDIAL MENISECTOMY Right 10/29/2015   Procedure: ARTHROSCOPY RIGHT KNEE  WITH PARTIAL MEDIAL MENISECTOMY;  Surgeon: Carole Civil, MD;  Location: [facility];  Service: Orthopedics;  Laterality: Right;  . POLYPECTOMY N/A 05/01/2012   Procedure: POLYPECTOMY;  Surgeon: Danie Binder, MD;  Location: [facility];  Service: Endoscopy;  Laterality: N/A;  . SHOULDER SURGERY  left  . VAGINAL HYSTERECTOMY      OB History   No obstetric history on file.      Home Medications    Prior to Admission medications   Medication Sig Start Date End Date Taking? Authorizing Provider  amoxicillin (AMOXIL) 500 MG capsule Take 2,000 mg by mouth See admin instructions. Take 2000 mg by mouth 1 hour prior to dental appointment    [provider]  aspirin EC 81 MG tablet Take 81 mg by mouth daily.     [provider]  buPROPion (WELLBUTRIN SR) 100 MG 12 hr tablet Take 1 tablet (100 mg total) by mouth 2 (two) times daily. 10/16/18   Norman Clay, MD  canagliflozin (INVOKANA) 300 MG TABS tablet Take 300 mg by mouth daily. 11/10/14   Niel Hummer, NP  clonazePAM (KLONOPIN) 0.5 MG tablet  Take 1 tablet (0.5 mg total) by mouth 3 (three) times daily as needed for anxiety. 11/09/18   Norman Clay, MD  cyclobenzaprine (FLEXERIL) 5 MG tablet Take 1-2 tablets (5-10 mg total) by mouth 3 (three) times daily as needed for muscle spasms. 01/30/19   Augusto Gamble B, NP  diclofenac sodium (VOLTAREN) 1 % GEL Apply 2 g topically 4 (four) times daily as needed (joint pain).    [provider]  Dulaglutide (TRULICITY) 1.5 0000000 SOPN Inject 1.5 mg into the skin every Saturday.     [provider]  DULoxetine (CYMBALTA) 30 MG capsule Take 1 capsule (30 mg total) by mouth at bedtime. 01/28/19   Suzzanne Cloud, NP  glipiZIDE (GLUCOTROL XL) 5 MG 24 hr tablet Take 5 mg by mouth daily with breakfast.    [provider]  ibuprofen (IBU) 800 MG tablet Take 800 mg by mouth every 8 (eight) hours as needed for moderate pain.  07/06/17   [provider]  levocetirizine (XYZAL) 5 MG  tablet Take 5 mg by mouth daily as needed for allergies.  09/11/18   [provider]  levothyroxine (SYNTHROID, LEVOTHROID) 150 MCG tablet Take 150 mcg by mouth daily before breakfast.    [provider]  LINZESS 145 MCG CAPS capsule Take 145 mcg by mouth daily before breakfast.  09/11/18   [provider]  metFORMIN (GLUCOPHAGE-XR) 500 MG 24 hr tablet Take 1,000 mg by mouth 2 (two) times daily with a meal.  05/23/17   [provider]  methocarbamol (ROBAXIN) 500 MG tablet Take 1 tablet (500 mg total) by mouth 4 (four) times daily. Patient taking differently: Take 500 mg by mouth 2 (two) times daily.  10/07/15   Carole Civil, MD  methylphenidate (RITALIN) 10 MG tablet Take 1 tablet (10 mg total) by mouth 2 (two) times daily. 12/15/18   Norman Clay, MD  Oxycodone HCl 10 MG TABS Take 10 mg by mouth 3 (three) times daily.     [provider]  pantoprazole (PROTONIX) 40 MG tablet Take 40 mg by mouth daily as needed (acid reflux).  06/29/17   [provider]  pioglitazone (ACTOS) 15 MG tablet Take 15 mg by mouth daily. 07/20/17   [provider]  predniSONE (STERAPRED UNI-PAK 21 TAB) 10 MG (21) TBPK tablet Take by mouth daily. Per box instruction 01/30/19   Augusto Gamble B, NP  pregabalin (LYRICA) 200 MG capsule Take 1 capsule (200 mg total) by mouth 3 (three) times daily. 01/02/19   Marcial Pacas, MD  QUEtiapine (SEROQUEL) 200 MG tablet Take 1 tablet (200 mg total) by mouth at bedtime. 12/10/18   Norman Clay, MD  simvastatin (ZOCOR) 40 MG tablet Take 1 tablet (40 mg total) by mouth daily. Patient taking differently: Take 40 mg by mouth at bedtime.  11/10/14   Niel Hummer, NP  SUMAtriptan (IMITREX) 100 MG tablet One tablet po prn migraine.  May repeat in 2 hrs if needed.  Do not exceed 200 mg per 24 hrs. Patient taking differently: Take 100 mg by mouth every 2 (two) hours as needed for migraine. One tablet po prn migraine.  May repeat in 2 hrs if needed.  Do not exceed 200 mg per 24 hrs. 06/26/12   Kem Parkinson, PA-C    Family History Family History  Problem Relation Age of Onset  . Cancer Other   . Diabetes Other   . Heart disease Other   . Arthritis Other   . Stroke Mother   . Anxiety disorder Mother   . Heart failure Mother   . Depression Mother   . Cancer - Lung Father   . Lung disease Father   . Colon cancer Father        father, diagnosed in his late 54s   . Heart failure Sister   . Depression Sister   . Alcohol abuse Brother   . Lung disease Other   . Heart disease Maternal Grandfather   . Stroke Paternal Grandmother   . Drug abuse Other   . Drug abuse Son   . Drug abuse Son     Social History Social History   Tobacco Use  . Smoking status: Current Every Day Smoker    Packs/day: 1.00    Years: 13.00    Pack years: 13.00    Types: Cigarettes  . Smokeless tobacco: Never Used  Substance Use Topics  . Alcohol use: No  . Drug use: No     Allergies   Patient has  no known allergies.    Review of Systems Review of Systems   Physical Exam Triage Vital Signs ED Triage Vitals  Enc Vitals Group     BP 01/30/19 0818 112/75     Pulse Rate 01/30/19 0818 70     Resp 01/30/19 0818 18     Temp 01/30/19 0818 98.2 F (36.8 C)     Temp Source 01/30/19 0818 Oral     SpO2 01/30/19 0818 97 %     Weight 01/30/19 0817 215 lb (97.5 kg)     Height --      Head Circumference --      Peak Flow --      Pain Score 01/30/19 0817 10     Pain Loc --      Pain Edu? --      Excl. in [place]? --    No data found.  Updated Vital Signs BP 112/75 (BP Location: Right Arm)   Pulse 70   Temp 98.2 F (36.8 C) (Oral)   Resp 18   Wt 215 lb (97.5 kg)   SpO2 97%   BMI 34.70 kg/m    Physical Exam Constitutional:      General: She is not in acute distress.    Appearance: She is well-developed.  Cardiovascular:     Rate and Rhythm: Normal rate.  Pulmonary:     Effort: Pulmonary effort is normal.  Musculoskeletal:     Lumbar back: She exhibits tenderness, bony tenderness and pain. She exhibits no swelling, no edema, no deformity, no laceration and normal pulse.       Back:     Comments: Right low back and mild midline pain on palpation; pain with right hip flexion as well as right straight leg raise; pain with transition from sit to lay and lay to sit; strength equal bilaterally; gross sensation intact to lower extremities; ambulatory with cane with some altered gait noted due to pain with raising right leg   Skin:    General: Skin is warm and dry.  Neurological:     Mental Status: She is alert and oriented to person, place, and time.      UC Treatments / Results  Labs (all labs ordered are listed, but only abnormal results are displayed) Labs Reviewed - No data to display  EKG   Radiology No results found.  Procedures Procedures (including critical care time)  Medications Ordered in UC Medications  HYDROcodone-acetaminophen (NORCO/VICODIN) 5-325 MG per tablet 1 tablet  (has no administration in time range)  methylPREDNISolone acetate (DEPO-MEDROL) injection 80 mg (has no administration in time range)    Initial Impression / Assessment and Plan / UC Course  I have reviewed the triage vital signs and the nursing notes.  Pertinent labs & imaging results that were available during my care of the patient were reviewed by me and considered in my medical decision making (see chart for details).     Acute on chronic low back pain and sciatica. No new injury or trauma, imaging deferred at this time. Will try steroids at this time to try to help with pain flare, appears that diabetes is quite well controlled and patient is agreeable to monitoring blood sugar. Will see if switching to flexeril provides any new relief of pain. No red flag findings. These were discussed with patient with return precautions. Encouraged continued follow up with PCP and/or neurology for management. Patient verbalized understanding and agreeable to plan.  Hydrocodone given before departure, patient's family member  drove her here. Ambulatory out of clinic without difficulty.    Final Clinical Impressions(s) / UC Diagnoses   Final diagnoses:  Right-sided low back pain with right-sided sciatica, unspecified chronicity     Discharge Instructions     We will try switching your muscle relaxer. Don't take your Methocarbamol and Flexeril. Flexeril as a muscle relaxer up to three times a day as needed, may cause drowsiness.  Also have provided a steroid pack, please monitor your blood sugars as this can elevate your blood sugar.  Light and regular activity as tolerated.  See exercises provided.  Heat application while active can help with muscle spasms.  Sleep with pillow under your knees.   Please follow up with your neurologist for recheck.  Any worsening of symptoms- increased pain, weakness, numbness tingling or change in sensation, loss of bladder or bowel control, please go to the ER.     ED Prescriptions    Medication Sig Dispense Auth. Provider   cyclobenzaprine (FLEXERIL) 5 MG tablet Take 1-2 tablets (5-10 mg total) by mouth 3 (three) times daily as needed for muscle spasms. 15 tablet Melodee Lupe, Lanelle Bal B, NP   predniSONE (STERAPRED UNI-PAK 21 TAB) 10 MG (21) TBPK tablet Take by mouth daily. Per box instruction 21 tablet Zigmund Gottron, NP     PDMP not reviewed this encounter.   Zigmund Gottron, NP 01/30/19 [phone]

## 2019-01-30 NOTE — Telephone Encounter (Signed)
APPT & REFILL REQUEST
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
## 2019-01-31 ENCOUNTER — Other Ambulatory Visit: Payer: Medicare HMO

## 2019-01-31 DIAGNOSIS — M545 Low back pain: Secondary | ICD-10-CM | POA: Diagnosis not present

## 2019-01-31 DIAGNOSIS — Z6833 Body mass index (BMI) 33.0-33.9, adult: Secondary | ICD-10-CM | POA: Diagnosis not present

## 2019-02-01 ENCOUNTER — Other Ambulatory Visit: Payer: Self-pay

## 2019-02-01 ENCOUNTER — Other Ambulatory Visit: Payer: Medicare HMO

## 2019-02-01 ENCOUNTER — Encounter (HOSPITAL_COMMUNITY): Payer: Self-pay | Admitting: Psychiatry

## 2019-02-01 ENCOUNTER — Encounter
Admission: RE | Admit: 2019-02-01 | Discharge: 2019-02-01 | Disposition: A | Discharge status: Home / Self Care | Payer: Medicare HMO | Source: Ambulatory Visit | Attending: Orthopedic Surgery | Admitting: Orthopedic Surgery

## 2019-02-01 ENCOUNTER — Ambulatory Visit (INDEPENDENT_AMBULATORY_CARE_PROVIDER_SITE_OTHER): Payer: Medicare HMO | Admitting: Psychiatry

## 2019-02-01 DIAGNOSIS — E119 Type 2 diabetes mellitus without complications: Secondary | ICD-10-CM | POA: Diagnosis not present

## 2019-02-01 DIAGNOSIS — Z20828 Contact with and (suspected) exposure to other viral communicable diseases: Secondary | ICD-10-CM | POA: Insufficient documentation

## 2019-02-01 DIAGNOSIS — F3341 Major depressive disorder, recurrent, in partial remission: Secondary | ICD-10-CM

## 2019-02-01 DIAGNOSIS — Z01818 Encounter for other preprocedural examination: Secondary | ICD-10-CM | POA: Diagnosis not present

## 2019-02-01 DIAGNOSIS — R9431 Abnormal electrocardiogram [ECG] [EKG]: Secondary | ICD-10-CM | POA: Insufficient documentation

## 2019-02-01 DIAGNOSIS — F4310 Post-traumatic stress disorder, unspecified: Secondary | ICD-10-CM | POA: Diagnosis not present

## 2019-02-01 LAB — SARS CORONAVIRUS 2 (TAT 6-24 HRS): SARS Coronavirus 2: NEGATIVE

## 2019-02-01 MED ORDER — METHYLPHENIDATE HCL 10 MG PO TABS: 10.0000 mg | ORAL_TABLET | Freq: Two times a day (BID) | ORAL | 0 refills | Status: DC

## 2019-02-01 MED ORDER — CLONAZEPAM 0.5 MG PO TABS: 0.5000 mg | ORAL_TABLET | Freq: Three times a day (TID) | ORAL | 3 refills | Status: DC | PRN

## 2019-02-01 MED ORDER — BUPROPION HCL ER (SR) 100 MG PO TB12: 100.0000 mg | ORAL_TABLET | Freq: Two times a day (BID) | ORAL | 1 refills | Status: DC

## 2019-02-01 MED ORDER — QUETIAPINE FUMARATE 200 MG PO TABS: 200.0000 mg | ORAL_TABLET | Freq: Every day | ORAL | 1 refills | Status: DC

## 2019-02-01 NOTE — Patient Instructions (Signed)
  Your procedure is scheduled on: Wednesday February 06, 2019 Report to Same Day Surgery 2nd floor [facility] Entrance-take elevator on left to 2nd floor.  Check in with surgery information desk.) To find out your arrival time, call [phone] 1:00-3:00 PM on Tuesday February 05, 2019   Remember: Instructions that are not followed completely may result in serious medical risk, up to and including death, or upon the discretion of your surgeon and anesthesiologist your surgery may need to be rescheduled.    __x__ 1. Do not eat food (including mints, candies, chewing gum) after midnight the night before your procedure. You may drink clear water up to 2 hours before you are scheduled to arrive at the hospital for your procedure.  Do not drink anything within 2 hours of your scheduled arrival to the hospital.  [facility] your Gatorade drink 2 hours before your scheduled arrival time.   __x__ 2. No Alcohol for 24 hours before or after surgery.   __x__ 3. No Smoking or e-cigarettes for 24 hours before surgery.  Do not use any chewable tobacco products for at least 6 hours before surgery.   __x__ 4. Notify your doctor if there is any change in your medical condition (cold, fever, infections).   __x__ 5. On the morning of surgery brush your teeth with toothpaste and water.  You may rinse your mouth with mouthwash if you wish.  Do not swallow any toothpaste or mouthwash.  Please read over the following fact sheets that you were given:   [facility] Preparing for Surgery and/or MRSA Information    __x__ Use CHG Soap as directed on instruction sheet.   Do not wear jewelry, make-up, hairpins, clips or nail polish on the day of surgery.  Do not wear lotions, powders, deodorant, or perfumes.   Do not shave below the face/neck 48 hours prior to surgery.   Do not bring valuables to the hospital.    [facility] is not responsible for any belongings or valuables.               Contacts,  dentures or bridgework may not be worn into surgery.  For patients discharged on the day of surgery, you will NOT be permitted to drive yourself home.  You must have a responsible adult with you for 24 hours after surgery.  __x__ Take these medicines on the morning of surgery with a SMALL SIP OF WATER:  1. Bupropion/ Wellbutrin  2. Levothyroxine/ Synthroid  3. Pantoprazole/ Protonix (Take an extra dose the evening before surgery)  4. Pregabalin/ Lyrica  5. Oxycodone if needed  Skip your Glipizide and Actos only on the morning of surgery.  __x__ Stop Metformin and Janumet 2 days before surgery (Last dose Sunday February 03, 2019).    __x__ Follow recommendations from Cardiologist, Pulmonologist or PCP regarding stopping Aspirin, Coumadin, Plavix, Eliquis, Effient, Pradaxa, and Pletal.  __x__ STARTING TODAY: Stop Anti-inflammatories such as Advil, Ibuprofen, Motrin, Aleve, aspirin, Naproxen, Naprosyn, BC/Goodies powders or aspirin products. You may continue to take Tylenol and Celebrex.   __x__ STARTING TODAY: Do not take any over-the-counter supplements until after surgery.

## 2019-02-04 ENCOUNTER — Telehealth: Payer: Self-pay | Admitting: *Deleted

## 2019-02-04 NOTE — Progress Notes (Signed)
I have reviewed and agreed above plan. 

## 2019-02-04 NOTE — Telephone Encounter (Signed)
Spoke with patient and informed her that her extensive lab evaluation does not show abnormality. She should continue Cymbalta and keep her next follow-up appointment with Dr. Krista Blue in March. She stated the Cymbalta has made a big difference. Patient verbalized understanding, appreciation.

## 2019-02-06 ENCOUNTER — Encounter: Payer: Self-pay | Admitting: Orthopedic Surgery

## 2019-02-06 ENCOUNTER — Other Ambulatory Visit: Payer: Self-pay

## 2019-02-06 ENCOUNTER — Ambulatory Visit: Payer: Medicare HMO | Admitting: Registered Nurse

## 2019-02-06 ENCOUNTER — Encounter
Admission: RE | Disposition: A | Discharge status: Home / Self Care | Payer: Self-pay | Source: Home / Self Care | Attending: Orthopedic Surgery

## 2019-02-06 ENCOUNTER — Ambulatory Visit
Admission: RE | Admit: 2019-02-06 | Discharge: 2019-02-06 | Disposition: A | Discharge status: Home / Self Care | Payer: Medicare HMO | Attending: Orthopedic Surgery | Admitting: Orthopedic Surgery

## 2019-02-06 DIAGNOSIS — K219 Gastro-esophageal reflux disease without esophagitis: Secondary | ICD-10-CM | POA: Insufficient documentation

## 2019-02-06 DIAGNOSIS — F209 Schizophrenia, unspecified: Secondary | ICD-10-CM | POA: Insufficient documentation

## 2019-02-06 DIAGNOSIS — M23222 Derangement of posterior horn of medial meniscus due to old tear or injury, left knee: Secondary | ICD-10-CM | POA: Diagnosis not present

## 2019-02-06 DIAGNOSIS — Z7984 Long term (current) use of oral hypoglycemic drugs: Secondary | ICD-10-CM | POA: Insufficient documentation

## 2019-02-06 DIAGNOSIS — Z79899 Other long term (current) drug therapy: Secondary | ICD-10-CM | POA: Insufficient documentation

## 2019-02-06 DIAGNOSIS — E119 Type 2 diabetes mellitus without complications: Secondary | ICD-10-CM | POA: Insufficient documentation

## 2019-02-06 DIAGNOSIS — F909 Attention-deficit hyperactivity disorder, unspecified type: Secondary | ICD-10-CM | POA: Insufficient documentation

## 2019-02-06 DIAGNOSIS — E7800 Pure hypercholesterolemia, unspecified: Secondary | ICD-10-CM | POA: Diagnosis not present

## 2019-02-06 DIAGNOSIS — Z6834 Body mass index (BMI) 34.0-34.9, adult: Secondary | ICD-10-CM | POA: Insufficient documentation

## 2019-02-06 DIAGNOSIS — M94262 Chondromalacia, left knee: Secondary | ICD-10-CM | POA: Insufficient documentation

## 2019-02-06 DIAGNOSIS — E039 Hypothyroidism, unspecified: Secondary | ICD-10-CM | POA: Diagnosis not present

## 2019-02-06 DIAGNOSIS — G43909 Migraine, unspecified, not intractable, without status migrainosus: Secondary | ICD-10-CM | POA: Diagnosis not present

## 2019-02-06 DIAGNOSIS — M797 Fibromyalgia: Secondary | ICD-10-CM | POA: Insufficient documentation

## 2019-02-06 DIAGNOSIS — Z79890 Hormone replacement therapy (postmenopausal): Secondary | ICD-10-CM | POA: Insufficient documentation

## 2019-02-06 DIAGNOSIS — M23322 Other meniscus derangements, posterior horn of medial meniscus, left knee: Secondary | ICD-10-CM | POA: Insufficient documentation

## 2019-02-06 DIAGNOSIS — E785 Hyperlipidemia, unspecified: Secondary | ICD-10-CM | POA: Insufficient documentation

## 2019-02-06 DIAGNOSIS — M2392 Unspecified internal derangement of left knee: Secondary | ICD-10-CM | POA: Diagnosis not present

## 2019-02-06 DIAGNOSIS — Z9889 Other specified postprocedural states: Secondary | ICD-10-CM

## 2019-02-06 DIAGNOSIS — F418 Other specified anxiety disorders: Secondary | ICD-10-CM | POA: Diagnosis not present

## 2019-02-06 DIAGNOSIS — F319 Bipolar disorder, unspecified: Secondary | ICD-10-CM | POA: Diagnosis not present

## 2019-02-06 DIAGNOSIS — Z7982 Long term (current) use of aspirin: Secondary | ICD-10-CM | POA: Insufficient documentation

## 2019-02-06 DIAGNOSIS — E1140 Type 2 diabetes mellitus with diabetic neuropathy, unspecified: Secondary | ICD-10-CM | POA: Diagnosis not present

## 2019-02-06 HISTORY — PX: KNEE ARTHROSCOPY: SHX127

## 2019-02-06 LAB — GLUCOSE, CAPILLARY
Glucose-Capillary: 91 mg/dL (ref 70–99)
Glucose-Capillary: 117 mg/dL — ABNORMAL HIGH (ref 70–99)

## 2019-02-06 SURGERY — ARTHROSCOPY, KNEE
Anesthesia: General | Site: Knee | Laterality: Left

## 2019-02-06 MED ORDER — METOCLOPRAMIDE HCL 5 MG/ML IJ SOLN: 5.0000 mg | Freq: Three times a day (TID) | INTRAMUSCULAR | Status: DC | PRN

## 2019-02-06 MED ORDER — PHENYLEPHRINE HCL (PRESSORS) 10 MG/ML IV SOLN
INTRAVENOUS | Status: DC | PRN
  Administered 2019-02-06: 50 ug via INTRAVENOUS
  Administered 2019-02-06: 100 ug via INTRAVENOUS
  Administered 2019-02-06: 50 ug via INTRAVENOUS

## 2019-02-06 MED ORDER — PROPOFOL 500 MG/50ML IV EMUL
INTRAVENOUS | Status: AC
  Filled 2019-02-06: qty 50

## 2019-02-06 MED ORDER — ACETAMINOPHEN 10 MG/ML IV SOLN
INTRAVENOUS | Status: DC | PRN
  Administered 2019-02-06: 1000 mg via INTRAVENOUS

## 2019-02-06 MED ORDER — FENTANYL CITRATE (PF) 100 MCG/2ML IJ SOLN
25.0000 ug | INTRAMUSCULAR | Status: DC | PRN
  Administered 2019-02-06 (×4): 25 ug via INTRAVENOUS

## 2019-02-06 MED ORDER — LIDOCAINE HCL (PF) 2 % IJ SOLN
INTRAMUSCULAR | Status: AC
  Filled 2019-02-06: qty 10

## 2019-02-06 MED ORDER — CELECOXIB 200 MG PO CAPS
ORAL_CAPSULE | ORAL | Status: AC
  Administered 2019-02-06: 400 mg via ORAL
  Filled 2019-02-06: qty 2

## 2019-02-06 MED ORDER — CELECOXIB 200 MG PO CAPS
400.0000 mg | ORAL_CAPSULE | Freq: Once | ORAL | Status: AC
  Administered 2019-02-06: 16:00:00 400 mg via ORAL

## 2019-02-06 MED ORDER — MIDAZOLAM HCL 2 MG/2ML IJ SOLN
INTRAMUSCULAR | Status: DC | PRN
  Administered 2019-02-06: 2 mg via INTRAVENOUS

## 2019-02-06 MED ORDER — MIDAZOLAM HCL 2 MG/2ML IJ SOLN
INTRAMUSCULAR | Status: AC
  Filled 2019-02-06: qty 2

## 2019-02-06 MED ORDER — PROPOFOL 10 MG/ML IV BOLUS
INTRAVENOUS | Status: DC | PRN
  Administered 2019-02-06: 160 mg via INTRAVENOUS

## 2019-02-06 MED ORDER — FENTANYL CITRATE (PF) 100 MCG/2ML IJ SOLN
INTRAMUSCULAR | Status: AC
  Filled 2019-02-06: qty 2

## 2019-02-06 MED ORDER — PROPOFOL 500 MG/50ML IV EMUL
INTRAVENOUS | Status: DC | PRN
  Administered 2019-02-06: 75 ug/kg/min via INTRAVENOUS

## 2019-02-06 MED ORDER — GLYCOPYRROLATE 0.2 MG/ML IJ SOLN
INTRAMUSCULAR | Status: AC
  Filled 2019-02-06: qty 1

## 2019-02-06 MED ORDER — ONDANSETRON HCL 4 MG PO TABS: 4.0000 mg | ORAL_TABLET | Freq: Four times a day (QID) | ORAL | Status: DC | PRN

## 2019-02-06 MED ORDER — ACETAMINOPHEN 10 MG/ML IV SOLN
INTRAVENOUS | Status: AC
  Filled 2019-02-06: qty 100

## 2019-02-06 MED ORDER — FENTANYL CITRATE (PF) 100 MCG/2ML IJ SOLN
INTRAMUSCULAR | Status: AC
  Administered 2019-02-06: 25 ug via INTRAVENOUS
  Filled 2019-02-06: qty 2

## 2019-02-06 MED ORDER — ONDANSETRON HCL 4 MG/2ML IJ SOLN: 4.0000 mg | Freq: Four times a day (QID) | INTRAMUSCULAR | Status: DC | PRN

## 2019-02-06 MED ORDER — MORPHINE SULFATE 4 MG/ML IJ SOLN
INTRAMUSCULAR | Status: DC | PRN
  Administered 2019-02-06: 4 mg via INTRAVENOUS

## 2019-02-06 MED ORDER — LIDOCAINE HCL (CARDIAC) PF 100 MG/5ML IV SOSY
PREFILLED_SYRINGE | INTRAVENOUS | Status: DC | PRN
  Administered 2019-02-06: 60 mg via INTRAVENOUS

## 2019-02-06 MED ORDER — DEXAMETHASONE SODIUM PHOSPHATE 10 MG/ML IJ SOLN
INTRAMUSCULAR | Status: DC | PRN
  Administered 2019-02-06: 10 mg via INTRAVENOUS

## 2019-02-06 MED ORDER — ONDANSETRON HCL 4 MG/2ML IJ SOLN
INTRAMUSCULAR | Status: DC | PRN
  Administered 2019-02-06: 4 mg via INTRAVENOUS

## 2019-02-06 MED ORDER — EPHEDRINE SULFATE 50 MG/ML IJ SOLN
INTRAMUSCULAR | Status: DC | PRN
  Administered 2019-02-06: 10 mg via INTRAVENOUS
  Administered 2019-02-06: 5 mg via INTRAVENOUS
  Administered 2019-02-06 (×2): 10 mg via INTRAVENOUS

## 2019-02-06 MED ORDER — SUCCINYLCHOLINE CHLORIDE 20 MG/ML IJ SOLN
INTRAMUSCULAR | Status: AC
  Filled 2019-02-06: qty 1

## 2019-02-06 MED ORDER — SODIUM CHLORIDE 0.9 % IV SOLN
INTRAVENOUS | Status: DC
  Administered 2019-02-06: 75 mL/h via INTRAVENOUS

## 2019-02-06 MED ORDER — CHLORHEXIDINE GLUCONATE 4 % EX LIQD
60.0000 mL | Freq: Once | CUTANEOUS | Status: AC
  Administered 2019-02-06: 4 via TOPICAL

## 2019-02-06 MED ORDER — GLYCOPYRROLATE 0.2 MG/ML IJ SOLN
INTRAMUSCULAR | Status: DC | PRN
  Administered 2019-02-06: 0.2 mg via INTRAVENOUS

## 2019-02-06 MED ORDER — FENTANYL CITRATE (PF) 100 MCG/2ML IJ SOLN
INTRAMUSCULAR | Status: DC | PRN
  Administered 2019-02-06: 25 ug via INTRAVENOUS
  Administered 2019-02-06: 50 ug via INTRAVENOUS

## 2019-02-06 MED ORDER — BUPIVACAINE-EPINEPHRINE 0.25% -1:200000 IJ SOLN
INTRAMUSCULAR | Status: DC | PRN
  Administered 2019-02-06: 25 mL
  Administered 2019-02-06: 5 mL

## 2019-02-06 MED ORDER — ONDANSETRON HCL 4 MG/2ML IJ SOLN
INTRAMUSCULAR | Status: AC
  Filled 2019-02-06: qty 2

## 2019-02-06 MED ORDER — ONDANSETRON HCL 4 MG/2ML IJ SOLN: 4.0000 mg | Freq: Once | INTRAMUSCULAR | Status: DC | PRN

## 2019-02-06 MED ORDER — SODIUM CHLORIDE 0.9 % IV SOLN: INTRAVENOUS | Status: DC

## 2019-02-06 MED ORDER — METOCLOPRAMIDE HCL 10 MG PO TABS: 5.0000 mg | ORAL_TABLET | Freq: Three times a day (TID) | ORAL | Status: DC | PRN

## 2019-02-06 SURGICAL SUPPLY — 34 items
ADAPTER IRRIG TUBE 2 SPIKE SOL (ADAPTER) ×4 IMPLANT
ADPR TBG 2 SPK PMP STRL ASCP (ADAPTER) ×2
BLADE SHAVER 4.5 DBL SERAT CV (CUTTER) IMPLANT
COVER WAND RF STERILE (DRAPES) ×3 IMPLANT
CUFF TOURN SGL QUICK 24 (TOURNIQUET CUFF)
CUFF TOURN SGL QUICK 30 (TOURNIQUET CUFF)
CUFF TRNQT CYL 24X4X16.5-23 (TOURNIQUET CUFF) IMPLANT
CUFF TRNQT CYL 30X4X21-28X (TOURNIQUET CUFF) IMPLANT
DRSG DERMACEA 8X12 NADH (GAUZE/BANDAGES/DRESSINGS) ×3 IMPLANT
DURAPREP 26ML APPLICATOR (WOUND CARE) ×6 IMPLANT
GAUZE SPONGE 4X4 12PLY STRL (GAUZE/BANDAGES/DRESSINGS) ×3 IMPLANT
GLOVE BIOGEL M STRL SZ7.5 (GLOVE) ×3 IMPLANT
GLOVE INDICATOR 8.0 STRL GRN (GLOVE) ×3 IMPLANT
GOWN STRL REUS W/ TWL LRG LVL3 (GOWN DISPOSABLE) ×2 IMPLANT
GOWN STRL REUS W/TWL LRG LVL3 (GOWN DISPOSABLE) ×6
IV LACTATED RINGER IRRG 3000ML (IV SOLUTION) ×18
IV LR IRRIG 3000ML ARTHROMATIC (IV SOLUTION) ×6 IMPLANT
KIT TURNOVER KIT A (KITS) ×3 IMPLANT
MANIFOLD NEPTUNE II (INSTRUMENTS) ×3 IMPLANT
PACK ARTHROSCOPY KNEE (MISCELLANEOUS) ×3 IMPLANT
PAD CAST CTTN 4X4 STRL (SOFTGOODS) IMPLANT
PADDING CAST COTTON 4X4 STRL (SOFTGOODS) ×3
SET TUBE SUCT SHAVER OUTFL 24K (TUBING) ×3 IMPLANT
SET TUBE TIP INTRA-ARTICULAR (MISCELLANEOUS) ×3 IMPLANT
SOL PREP PVP 2OZ (MISCELLANEOUS) ×3
SOLUTION PREP PVP 2OZ (MISCELLANEOUS) ×1 IMPLANT
STOCKINETTE BIAS CUT 6 980064 (GAUZE/BANDAGES/DRESSINGS) ×2 IMPLANT
SUT ETHILON 3-0 FS-10 30 BLK (SUTURE) ×3
SUTURE EHLN 3-0 FS-10 30 BLK (SUTURE) ×1 IMPLANT
TUBING ARTHRO INFLOW-ONLY STRL (TUBING) ×3 IMPLANT
TUBING CONNECTING 10 (TUBING) ×1 IMPLANT
TUBING CONNECTING 10' (TUBING) ×1
WAND HAND CNTRL MULTIVAC 50 (MISCELLANEOUS) ×3 IMPLANT
WRAP KNEE W/COLD PACKS 25.5X14 (SOFTGOODS) ×3 IMPLANT

## 2019-02-06 NOTE — H&P (Signed)
The patient has been re-examined, and the chart reviewed, and there have been no interval changes to the documented history and physical.    The risks, benefits, and alternatives have been discussed at length. The patient expressed understanding of the risks benefits and agreed with plans for surgical intervention.  Cyd Hostler P. Natahsa Marian, Jr. M.D.    

## 2019-02-06 NOTE — Discharge Instructions (Signed)
AMBULATORY SURGERY  DISCHARGE INSTRUCTIONS   1) The drugs that you were given will stay in your system until tomorrow so for the next 24 hours you should not:  A) Drive an automobile B) Make any legal decisions C) Drink any alcoholic beverage   2) You may resume regular meals tomorrow.  Today it is better to start with liquids and gradually work up to solid foods.  You may eat anything you prefer, but it is better to start with liquids, then soup and crackers, and gradually work up to solid foods.   3) Please notify your doctor immediately if you have any unusual bleeding, trouble breathing, redness and pain at the surgery site, drainage, fever, or pain not relieved by medication. 4)   5) Your post-operative visit with Dr.                                     is: Date:                        Time:    Please call to schedule your post-operative visit.  6) Additional Instructions:       Instructions after Knee Arthroscopy    James P. Hooten, Jr., M.D.     Dept. of Orthopaedics & Sports Medicine  [facility]  [address]  [place], [place]  [address]   Phone: [phone]   Fax: [phone]   DIET: . Drink plenty of non-alcoholic fluids & begin a light diet. . Resume your normal diet the day after surgery.  ACTIVITY:  . You may use crutches or a walker with weight-bearing as tolerated, unless instructed otherwise. . You may wean yourself off of the walker or crutches as tolerated.  . Begin doing gentle exercises. Exercising will reduce the pain and swelling, increase motion, and prevent muscle weakness.   . Avoid strenuous activities or athletics for a minimum of 4-6 weeks after arthroscopic surgery. . Do not drive or operate any equipment until instructed.  WOUND CARE:  . Place one to two pillows under the knee the first day or two when sitting or lying.  . Continue to use the ice packs periodically to reduce pain and swelling. . The small incisions in  your knee are closed with nylon stitches. The stitches will be removed in the office. . The bulky dressing may be removed on the second day after surgery. DO NOT TOUCH THE STITCHES. Put a Band-Aid over each stitch. Do NOT use any ointments or creams on the incisions.  . You may bathe or shower after the stitches are removed at the first office visit following surgery.  MEDICATIONS: . You may resume your regular medications. . Please take the pain medication as prescribed. . Do not take pain medication on an empty stomach. . Do not drive or drink alcoholic beverages when taking pain medications.  CALL THE OFFICE FOR: . Temperature above 101 degrees . Excessive bleeding or drainage on the dressing. . Excessive swelling, coldness, or paleness of the toes. . Persistent nausea and vomiting.  FOLLOW-UP:  . You should have an appointment to return to the office in 7-10 days after surgery.      [facility] Department Directory         [email or web address]       [email or web address]          Cardiology  Appointments: [place] -   [phone] [place] - [phone]  Endocrinology  Appointments: [place] - [phone] [place] - [phone]  Gastroenterology  Appointments: [place] - [phone] [place] - [phone]        General Surgery   Appointments: [place] - [phone]  Internal Medicine/Family Medicine  Appointments: [place] - [phone] [place] - [phone] [place] - [phone]  Metabolic and Weigh Loss Surgery  Appointments: [place] - [phone]        Neurology  Appointments: [place] - [phone] [place] - [phone]  Neurosurgery  Appointments: [place] - [phone]  Obstetrics & Gynecology  Appointments: [place] - [phone] [place] - [phone]        Pediatrics  Appointments: [place] - [phone] [place] - [phone]  Physiatry  Appointments: [place]  [phone]  Physical Therapy  Appointments: [place] - [phone] [place] - [phone]        Podiatry  Appointments: [place] - [phone] [place] - [phone]  Pulmonology  Appointments: [place] - [phone]  Rheumatology  Appointments: [place] - [phone]        [place] Location: [facility]  [address] [place], [place]  [address]  [place] Location: [facility] [address] [place], [place]  [address]  [place] Location: [facility] [address] [place], [place]  [address]    

## 2019-02-06 NOTE — Anesthesia Preprocedure Evaluation (Addendum)
Anesthesia Evaluation  Patient identified by MRN, date of birth, ID band Patient awake    Reviewed: Allergy & Precautions, H&P , NPO status , Patient's Chart, lab work & pertinent test results, reviewed documented beta blocker date and time   History of Anesthesia Complications (+) PONV and history of anesthetic complications  Airway Mallampati: III   Neck ROM: full    Dental  (+) Teeth Intact   Pulmonary Current Smoker, former smoker,           Cardiovascular Exercise Tolerance: Good negative cardio ROS Normal cardiovascular exam     Neuro/Psych  Headaches, PSYCHIATRIC DISORDERS Anxiety Depression Bipolar Disorder Schizophrenia  Neuromuscular disease negative psych ROS   GI/Hepatic negative GI ROS, Neg liver ROS, GERD  Medicated,  Endo/Other  diabetes, Poorly Controlled, Type 2, Oral Hypoglycemic AgentsHypothyroidism   Renal/GU negative Renal ROS  negative genitourinary   Musculoskeletal  (+) Arthritis , Fibromyalgia -  Abdominal   Peds  Hematology negative hematology ROS (+)   Anesthesia Other Findings Past Medical History: No date: Anxiety No date: Bipolar disorder ([place]) No date: Complication of anesthesia No date: Degenerative disc disease, lumbar No date: Depression No date: Diabetes mellitus No date: Elevated cholesterol No date: Fibromyalgia No date: GERD (gastroesophageal reflux disease) No date: Migraine No date: Osteoarthritis No date: PONV (postoperative nausea and vomiting) No date: PTSD (post-traumatic stress disorder)     Comment:  after death of son (accidental overdose) No date: Thyroid disease Past Surgical History: bilateral: CARPAL TUNNEL RELEASE No date: CHOLECYSTECTOMY 05/01/2012: COLONOSCOPY WITH PROPOFOL; N/A     Comment:  [id number] COLON polyps/Mild diverticulosis was noted in               the sigmoid colon/Moderate sized internal hemorrhoids.               path with tubular  adenoma No date: gallbladder No date: KNEE ARTHROSCOPY     Comment:  bilateral 10/29/2015: KNEE ARTHROSCOPY WITH MEDIAL MENISECTOMY; Right     Comment:  Procedure: ARTHROSCOPY RIGHT KNEE  WITH PARTIAL MEDIAL               MENISECTOMY;  Surgeon: Carole Civil, MD;  Location:              [facility];  Service: Orthopedics;  Laterality: Right; 05/01/2012: POLYPECTOMY; N/A     Comment:  Procedure: POLYPECTOMY;  Surgeon: Danie Binder, MD;                Location: [facility];  Service: Endoscopy;  Laterality: N/A; left: SHOULDER SURGERY No date: VAGINAL HYSTERECTOMY   Reproductive/Obstetrics negative OB ROS                            Anesthesia Physical  Anesthesia Plan  ASA: III  Anesthesia Plan: General   Post-op Pain Management:    Induction:   PONV Risk Score and Plan: 4 or greater and Dexamethasone, Ondansetron, Treatment may vary due to age or medical condition and Midazolam  Airway Management Planned: LMA  Additional Equipment:   Intra-op Plan:   Post-operative Plan: Extubation in OR  Informed Consent: I have reviewed the patients History and Physical, chart, labs and discussed the procedure including the risks, benefits and alternatives for the proposed anesthesia with the patient or authorized representative who has indicated his/her understanding and acceptance.       Plan Discussed with: CRNA  Anesthesia Plan Comments: (Pt with intermittent  history of low back pain.  Risks and benefits of SAB discussed with her and she realizes the potential for worsening low back pain especially in the immediate peroperative period.  She does not want general anesthesia if this can be avoided with an ETT.  JA)       Anesthesia Quick Evaluation

## 2019-02-06 NOTE — Op Note (Signed)
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   ASSISTANT: none  ANESTHESIA: general  ESTIMATED BLOOD LOSS: Minimal  FLUIDS REPLACED: 800 mL of crystalloid  TOURNIQUET TIME: Not used  INDICATIONS FOR SURGERY: [name] is a 58 y.o. year old female who has been seen for complaints of left knee pain. MRI demonstrated findings consistent with meniscal pathology. After discussion of the risks and benefits of surgical intervention, the patient expressed understanding of the risks benefits and agree with plans for left knee arthroscopy.   PROCEDURE IN DETAIL: The patient was brought into the operating room and, after adequate general anesthesia was achieved, a tourniquet was applied to the left thigh and the leg was placed in the leg holder. All bony prominences were well padded. The patient's left knee was cleaned and prepped with alcohol and Duraprep and draped in the usual sterile fashion. A "timeout" was performed as per usual protocol. The anticipated portal sites were injected with 0.25% Marcaine with epinephrine. An anterolateral incision was made and a cannula was inserted. A moderate effusion was evacuated and the knee was distended with fluid using the pump. The scope was advanced down the medial gutter into the medial compartment. Under visualization with the scope, an anteromedial portal was created and a hooked probe was inserted. The medial meniscus was visualized and probed.  There was a tear of the posterior horn of the medial meniscus.  The tear was debrided using  meniscal punches and a 4.5 mm incisor shaver.  Final contouring was performed using the 50 degree ArthroCare wand.  The remaining rim of meniscus was visualized and probed felt be stable.  The articular cartilage was visualized.  There was an area of grade IV chondromalacia along the posterior medial aspect of the tibial plateau.  This area was debrided and contoured using the ArthroCare wand.  Grade 3 changes of chondromalacia were also appreciated to areas of the medial femoral condyle.  These areas were also debrided using the ArthroCare wand.  The scope was then advanced into the intercondylar notch. The anterior cruciate ligament was visualized and probed and felt to be intact. The scope was removed from the lateral portal and reinserted via the anteromedial portal to better visualize the lateral compartment. The lateral meniscus was visualized and probed.  The lateral meniscus was intact without evidence of tear or instability.  The articular cartilage of the lateral compartment was visualized and noted to be in good condition.  Finally, the scope was advanced so as to visualize the patellofemoral articulation. Good patellar tracking was appreciated.  The articular surface was in good condition.  The knee was irrigated with copius amounts of fluid and suctioned dry. The anterolateral portal was re-approximated with #3-0 nylon. A combination of 0.25% Marcaine with epinephrine and 4 mg of Morphine were injected via the scope. The scope was removed and the anteromedial portal was re-approximated with #3-0 nylon. A sterile dressing was applied followed by application of an ice wrap.  The patient tolerated the procedure well and was transported to the PACU in stable condition.   P.  [name]., M.D.

## 2019-02-06 NOTE — Anesthesia Post-op Follow-up Note (Signed)
Anesthesia QCDR form completed.        

## 2019-02-06 NOTE — Anesthesia Procedure Notes (Addendum)
Procedure Name: LMA Insertion Date/Time: 02/06/2019 5:07 PM Performed by: Leeroy Cha, CRNA Pre-anesthesia Checklist: Patient identified, Emergency Drugs available, Suction available and Patient being monitored Patient Re-evaluated:Patient Re-evaluated prior to induction Oxygen Delivery Method: Circle system utilized Preoxygenation: Pre-oxygenation with 100% oxygen Induction Type: IV induction Ventilation: Mask ventilation without difficulty LMA: LMA inserted LMA Size: 4.0 Number of attempts: 1 Airway Equipment and Method: Oral airway Placement Confirmation: breath sounds checked- equal and bilateral and positive ETCO2 Tube secured with: Tape Dental Injury: Teeth and Oropharynx as per pre-operative assessment

## 2019-02-06 NOTE — Transfer of Care (Signed)
Immediate Anesthesia Transfer of Care Note  Patient: [name]  Procedure(s) Performed: LEFT KNEE ARTHROSCOPY WITH  PARTIAL MEDIAL MENISECTOMY WITH MEDIAL CHONDRALPLASTY (Left Knee)  Patient Location: PACU  Anesthesia Type:General  Level of Consciousness: awake, alert  and patient cooperative  Airway & Oxygen Therapy: Patient Spontanous Breathing and Patient connected to face mask oxygen  Post-op Assessment: Report given to RN and Post -op Vital signs reviewed and stable  Post vital signs: Reviewed and stable  Last Vitals:  Vitals Value Taken Time  BP 135/72 02/06/19 1838  Temp    Pulse 91 02/06/19 1841  Resp 9 02/06/19 1841  SpO2 100 % 02/06/19 1841  Vitals shown include unvalidated device data.  Last Pain:  Vitals:   02/06/19 1606  TempSrc: Tympanic  PainSc: 4          Complications: No apparent anesthesia complications

## 2019-02-07 ENCOUNTER — Encounter: Payer: Self-pay | Admitting: Orthopedic Surgery

## 2019-02-08 NOTE — Anesthesia Postprocedure Evaluation (Signed)
Anesthesia Post Note  Patient: [name]  Procedure(s) Performed: LEFT KNEE ARTHROSCOPY WITH  PARTIAL MEDIAL MENISECTOMY WITH MEDIAL CHONDRALPLASTY (Left Knee)  Patient location during evaluation: PACU Anesthesia Type: General Level of consciousness: awake and alert Pain management: pain level controlled Vital Signs Assessment: post-procedure vital signs reviewed and stable Respiratory status: spontaneous breathing and respiratory function stable Cardiovascular status: stable Anesthetic complications: no     Last Vitals:  Vitals:   02/06/19 1937 02/06/19 1938  BP: 113/71 113/71  Pulse:  82  Resp:    Temp:    SpO2: 97% 93%    Last Pain:  Vitals:   02/07/19 0804  TempSrc:   PainSc: 4                  [name]

## 2019-02-13 ENCOUNTER — Other Ambulatory Visit (HOSPITAL_COMMUNITY): Payer: Medicare HMO

## 2019-02-15 ENCOUNTER — Other Ambulatory Visit (HOSPITAL_COMMUNITY)
Admission: RE | Admit: 2019-02-15 | Discharge: 2019-02-15 | Disposition: A | Discharge status: Home / Self Care | Payer: Medicare HMO | Source: Ambulatory Visit | Attending: Gastroenterology | Admitting: Gastroenterology

## 2019-02-19 ENCOUNTER — Encounter (HOSPITAL_COMMUNITY): Payer: Self-pay

## 2019-02-19 ENCOUNTER — Ambulatory Visit (HOSPITAL_COMMUNITY): Admit: 2019-02-19 | Payer: Medicare HMO | Admitting: Gastroenterology

## 2019-02-19 SURGERY — COLONOSCOPY WITH PROPOFOL
Anesthesia: Monitor Anesthesia Care

## 2019-03-18 DIAGNOSIS — M47816 Spondylosis without myelopathy or radiculopathy, lumbar region: Secondary | ICD-10-CM | POA: Diagnosis not present

## 2019-04-19 DIAGNOSIS — E1140 Type 2 diabetes mellitus with diabetic neuropathy, unspecified: Secondary | ICD-10-CM | POA: Diagnosis not present

## 2019-04-25 ENCOUNTER — Encounter: Payer: Self-pay | Admitting: Psychiatry

## 2019-04-25 ENCOUNTER — Ambulatory Visit (INDEPENDENT_AMBULATORY_CARE_PROVIDER_SITE_OTHER): Payer: Medicare HMO | Admitting: Psychiatry

## 2019-04-25 ENCOUNTER — Other Ambulatory Visit: Payer: Self-pay

## 2019-04-25 DIAGNOSIS — F4310 Post-traumatic stress disorder, unspecified: Secondary | ICD-10-CM | POA: Diagnosis not present

## 2019-04-25 DIAGNOSIS — F3341 Major depressive disorder, recurrent, in partial remission: Secondary | ICD-10-CM

## 2019-04-25 DIAGNOSIS — F900 Attention-deficit hyperactivity disorder, predominantly inattentive type: Secondary | ICD-10-CM | POA: Insufficient documentation

## 2019-04-25 MED ORDER — CLONAZEPAM 0.5 MG PO TABS: 0.5000 mg | ORAL_TABLET | Freq: Three times a day (TID) | ORAL | 2 refills | Status: DC | PRN

## 2019-04-25 MED ORDER — BUPROPION HCL ER (SR) 100 MG PO TB12: 100.0000 mg | ORAL_TABLET | Freq: Two times a day (BID) | ORAL | 0 refills | Status: DC

## 2019-04-25 MED ORDER — METHYLPHENIDATE HCL 10 MG PO TABS: 10.0000 mg | ORAL_TABLET | Freq: Two times a day (BID) | ORAL | 0 refills | Status: DC

## 2019-04-25 MED ORDER — QUETIAPINE FUMARATE 200 MG PO TABS: 200.0000 mg | ORAL_TABLET | Freq: Every day | ORAL | 0 refills | Status: DC

## 2019-04-25 NOTE — Progress Notes (Addendum)
[facility] MD OP Progress Note  Virtual Visit via Telephone Note  I connected with [name] on 04/25/19 at  9:00 AM EST by telephone and verified that I am speaking with the correct person using two identifiers.   I discussed the limitations, risks, security and privacy concerns of performing an evaluation and management service by telephone and the availability of in person appointments. I also discussed with the patient that there may be a patient responsible charge related to this service. The patient expressed understanding and agreed to proceed.   04/25/2019 9:11 AM [name]  MRN:  [id number]  Chief Complaint: " I have to work hard to have a good day."  HPI: Patient stated that overall things are going well.  Medication combination is helpful.  She needs to take clonazepam round-the-clock regularly as without it she has uncontrolled anxiety.  Seroquel is helpful with her sleep although she does have some nights when she wakes up and has a hard time going back to sleep.  She informed that she has to work in order to have a good day and when asked to elaborate she explained that she tries to keep herself occupied and do things that give her pleasure.  She gave an example that she likes to play with her cats on the porch and that seems to make her feel better. She keeps herself busy with activities so that she does not feel down or sad. She stated that she would like to continue the same regimen for now.  She asked for an appointment with a therapist that she has not had any sessions recently.  She was seeing Mr. Clemmie Krill in the past.  Visit Diagnosis:    ICD-10-CM   1. MDD (major depressive disorder), recurrent, in partial remission ([place])  F33.41   2. Post traumatic stress disorder (PTSD)  F43.10   3. Attention deficit hyperactivity disorder (ADHD), predominantly inattentive type  F90.0     Past Psychiatric History: MDD, PTSD  Past Medical History:  Past Medical History:   Diagnosis Date  . Anxiety   . Bipolar disorder ([place])   . Complication of anesthesia   . Degenerative disc disease, lumbar   . Depression   . Diabetes mellitus   . Elevated cholesterol   . Fibromyalgia   . GERD (gastroesophageal reflux disease)   . Hypothyroidism   . Migraine   . Neuropathy   . Osteoarthritis   . PONV (postoperative nausea and vomiting)   . PTSD (post-traumatic stress disorder)    after death of son (accidental overdose)  . Thyroid disease     Past Surgical History:  Procedure Laterality Date  . BILATERAL OOPHORECTOMY    . CARPAL TUNNEL RELEASE  bilateral  . CHOLECYSTECTOMY    . COLONOSCOPY WITH PROPOFOL N/A 05/01/2012   [id number] COLON polyps/Mild diverticulosis was noted in the sigmoid colon/Moderate sized internal hemorrhoids. path with tubular adenoma  . gallbladder    . KNEE ARTHROPLASTY Right 08/28/2017   Procedure: COMPUTER ASSISTED TOTAL KNEE ARTHROPLASTY;  Surgeon: Dereck Leep, MD;  Location: [facility];  Service: Orthopedics;  Laterality: Right;  . KNEE ARTHROSCOPY     bilateral  . KNEE ARTHROSCOPY Left 02/06/2019   Procedure: LEFT KNEE ARTHROSCOPY WITH  PARTIAL MEDIAL MENISECTOMY WITH MEDIAL CHONDRALPLASTY;  Surgeon: Dereck Leep, MD;  Location: [facility];  Service: Orthopedics;  Laterality: Left;  . KNEE ARTHROSCOPY WITH MEDIAL MENISECTOMY Right 10/29/2015   Procedure: ARTHROSCOPY RIGHT KNEE  WITH PARTIAL MEDIAL  MENISECTOMY;  Surgeon: Carole Civil, MD;  Location: [facility];  Service: Orthopedics;  Laterality: Right;  . POLYPECTOMY N/A 05/01/2012   Procedure: POLYPECTOMY;  Surgeon: Danie Binder, MD;  Location: [facility];  Service: Endoscopy;  Laterality: N/A;  . SHOULDER SURGERY  left  . VAGINAL HYSTERECTOMY      Family Psychiatric History:   Family History:  Family History  Problem Relation Age of Onset  . Cancer Other   . Diabetes Other   . Heart disease Other   . Arthritis Other   . Stroke Mother   . Anxiety disorder Mother   .  Heart failure Mother   . Depression Mother   . Cancer - Lung Father   . Lung disease Father   . Colon cancer Father        father, diagnosed in his late 62s   . Heart failure Sister   . Depression Sister   . Alcohol abuse Brother   . Lung disease Other   . Heart disease Maternal Grandfather   . Stroke Paternal Grandmother   . Drug abuse Other   . Drug abuse Son   . Drug abuse Son     Social History:  Social History   Socioeconomic History  . Marital status: Single    Spouse name: Not on file  . Number of children: 2  . Years of education: 35  . Highest education level: High school graduate  Occupational History  . Occupation: unemployed    Fish farm manager: UNEMPLOYED  Tobacco Use  . Smoking status: Former Smoker    Packs/day: 1.00    Years: 13.00    Pack years: 13.00    Types: Cigarettes    Quit date: 02/06/2019    Years since quitting: 0.2  . Smokeless tobacco: Never Used  Substance and Sexual Activity  . Alcohol use: No  . Drug use: No  . Sexual activity: Not Currently    Birth control/protection: Surgical  Other Topics Concern  . Not on file  Social History Narrative   Lives at home with her boyfriend and her son.   Right-handed.   No caffeine use.   Two children - one living, one deceased.   Social Determinants of Health   Financial Resource Strain:   . Difficulty of Paying Living Expenses: Not on file  Food Insecurity:   . Worried About Charity fundraiser in the Last Year: Not on file  . Ran Out of Food in the Last Year: Not on file  Transportation Needs:   . Lack of Transportation (Medical): Not on file  . Lack of Transportation (Non-Medical): Not on file  Physical Activity:   . Days of Exercise per Week: Not on file  . Minutes of Exercise per Session: Not on file  Stress:   . Feeling of Stress : Not on file  Social Connections:   . Frequency of Communication with Friends and Family: Not on file  . Frequency of Social Gatherings with Friends and  Family: Not on file  . Attends Religious Services: Not on file  . Active Member of Clubs or Organizations: Not on file  . Attends Archivist Meetings: Not on file  . Marital Status: Not on file    Allergies: No Known Allergies  Metabolic Disorder Labs: Lab Results  Component Value Date   HGBA1C 7.6 (H) 08/16/2017   MPG 171.42 08/16/2017   No results found for: PROLACTIN No results found for: CHOL, TRIG, HDL, CHOLHDL, VLDL, LDLCALC  No results found for: TSH  Therapeutic Level Labs: No results found for: LITHIUM No results found for: VALPROATE No components found for:  CBMZ  Current Medications: Current Outpatient Medications  Medication Sig Dispense Refill  . amoxicillin (AMOXIL) 500 MG capsule Take 2,000 mg by mouth See admin instructions. Take 2000 mg by mouth 1 hour prior to dental appointment    . aspirin EC 81 MG tablet Take 81 mg by mouth daily.     Marland Kitchen buPROPion (WELLBUTRIN SR) 100 MG 12 hr tablet Take 1 tablet (100 mg total) by mouth 2 (two) times daily. 180 tablet 1  . canagliflozin (INVOKANA) 300 MG TABS tablet Take 300 mg by mouth daily. 30 tablet   . clonazePAM (KLONOPIN) 0.5 MG tablet Take 1 tablet (0.5 mg total) by mouth 3 (three) times daily as needed for anxiety. 90 tablet 3  . cyclobenzaprine (FLEXERIL) 5 MG tablet Take 1-2 tablets (5-10 mg total) by mouth 3 (three) times daily as needed for muscle spasms. (Patient not taking: Reported on 02/06/2019) 15 tablet 0  . diclofenac sodium (VOLTAREN) 1 % GEL Apply 2 g topically 4 (four) times daily as needed (joint pain).    . Dulaglutide (TRULICITY) 1.5 0000000 SOPN Inject 1.5 mg into the skin every Saturday.     . DULoxetine (CYMBALTA) 30 MG capsule Take 1 capsule (30 mg total) by mouth at bedtime. 30 capsule 3  . glipiZIDE (GLUCOTROL XL) 5 MG 24 hr tablet Take 5 mg by mouth daily with breakfast.    . ibuprofen (IBU) 800 MG tablet Take 800 mg by mouth every 8 (eight) hours as needed for moderate pain.     Marland Kitchen  levocetirizine (XYZAL) 5 MG tablet Take 5 mg by mouth daily as needed for allergies.     Marland Kitchen levothyroxine (SYNTHROID, LEVOTHROID) 150 MCG tablet Take 150 mcg by mouth daily before breakfast.    . LINZESS 145 MCG CAPS capsule Take 145 mcg by mouth daily before breakfast.     . metFORMIN (GLUCOPHAGE-XR) 500 MG 24 hr tablet Take 1,000 mg by mouth 2 (two) times daily with a meal.     . methocarbamol (ROBAXIN) 500 MG tablet Take 1 tablet (500 mg total) by mouth 4 (four) times daily. (Patient taking differently: Take 500 mg by mouth 2 (two) times daily. ) 90 tablet 5  . methylphenidate (RITALIN) 10 MG tablet Take 1 tablet (10 mg total) by mouth 2 (two) times daily. 60 tablet 0  . methylphenidate (RITALIN) 10 MG tablet Take 1 tablet (10 mg total) by mouth 2 (two) times daily. 60 tablet 0  . methylphenidate (RITALIN) 10 MG tablet Take 1 tablet (10 mg total) by mouth 2 (two) times daily. 60 tablet 0  . methylphenidate (RITALIN) 10 MG tablet Take 1 tablet (10 mg total) by mouth 2 (two) times daily. 60 tablet 0  . Oxycodone HCl 10 MG TABS Take 10 mg by mouth 3 (three) times daily.     . pantoprazole (PROTONIX) 40 MG tablet Take 40 mg by mouth daily as needed (acid reflux).     . pioglitazone (ACTOS) 15 MG tablet Take 15 mg by mouth daily.    . predniSONE (STERAPRED UNI-PAK 21 TAB) 10 MG (21) TBPK tablet Take by mouth daily. Per box instruction (Patient not taking: Reported on 02/06/2019) 21 tablet 0  . pregabalin (LYRICA) 200 MG capsule Take 1 capsule (200 mg total) by mouth 3 (three) times daily. 90 capsule 5  . QUEtiapine (SEROQUEL) 200 MG tablet  Take 1 tablet (200 mg total) by mouth at bedtime. 90 tablet 1  . simvastatin (ZOCOR) 40 MG tablet Take 1 tablet (40 mg total) by mouth daily. (Patient taking differently: Take 40 mg by mouth at bedtime. ) 30 tablet   . SUMAtriptan (IMITREX) 100 MG tablet One tablet po prn migraine.  May repeat in 2 hrs if needed.  Do not exceed 200 mg per 24 hrs. (Patient taking  differently: Take 100 mg by mouth every 2 (two) hours as needed for migraine. One tablet po prn migraine.  May repeat in 2 hrs if needed.  Do not exceed 200 mg per 24 hrs.) 5 tablet 0   No current facility-administered medications for this visit.      Psychiatric Specialty Exam: Review of Systems  There were no vitals taken for this visit.There is no height or weight on file to calculate BMI.  General Appearance: unable to assess due to phone visit  Eye Contact:  unable to assess due to phone visit  Speech:  Clear and Coherent and Normal Rate  Volume:  Normal  Mood:  Euthymic  Affect:  Congruent  Thought Process:  Goal Directed, Linear and Descriptions of Associations: Intact  Orientation:  Full (Time, Place, and Person)  Thought Content: Logical   Suicidal Thoughts:  No  Homicidal Thoughts:  No  Memory:  Recent;   Good Remote;   Good  Judgement:  Good  Insight:  Good  Psychomotor Activity:  Normal  Concentration:  Concentration: Good and Attention Span: Good  Recall:  Good  Fund of Knowledge: Good  Language: Good  Akathisia:  Negative  Handed:  Right  AIMS (if indicated): not done  Assets:  Communication Skills Desire for Improvement Financial Resources/Insurance Housing  ADL's:  Intact  Cognition: WNL  Sleep:  Fair    Screenings: AIMS     Admission (Discharged) from 11/07/2014 in [facility]  AIMS Total Score  0    AUDIT     Admission (Discharged) from 11/07/2014 in [facility]  Alcohol Use Disorder Identification Test Final Score (AUDIT)  0       Assessment and Plan: 59 y.o. year old female with a history of PTSD, depression,  ADHD, type II diabetes with neuropathy, hypercholesterolemia,GERD,hypothyroidism,osteoarthritis of hand,migraine,Fibromyalgia,  who was contacted via phone for follow-up.  Patient appears to be doing fairly well on her current medication regimen and will continue  same for now.  1. MDD (major depressive disorder), recurrent, in partial remission ([facility])  - buPROPion (WELLBUTRIN SR) 100 MG 12 hr tablet; Take 1 tablet (100 mg total) by mouth 2 (two) times daily.  Dispense: 180 tablet; Refill: 0 - clonazePAM (KLONOPIN) 0.5 MG tablet; Take 1 tablet (0.5 mg total) by mouth 3 (three) times daily as needed for anxiety.  Dispense: 90 tablet; Refill: 2 - QUEtiapine (SEROQUEL) 200 MG tablet; Take 1 tablet (200 mg total) by mouth at bedtime.  Dispense: 90 tablet; Refill: 0 - She also takes Cymbalta 30 mg daily prescribed by PCP.  2. Post traumatic stress disorder (PTSD)  - buPROPion (WELLBUTRIN SR) 100 MG 12 hr tablet; Take 1 tablet (100 mg total) by mouth 2 (two) times daily.  Dispense: 180 tablet; Refill: 0 - clonazePAM (KLONOPIN) 0.5 MG tablet; Take 1 tablet (0.5 mg total) by mouth 3 (three) times daily as needed for anxiety.  Dispense: 90 tablet; Refill: 2  3. Attention deficit hyperactivity disorder (ADHD), predominantly inattentive type - methylphenidate (  RITALIN) 10 MG tablet; Take 1 tablet (10 mg total) by mouth 2 (two) times daily.  Dispense: 60 tablet; Refill: 0 - methylphenidate (RITALIN) 10 MG tablet; Take 1 tablet (10 mg total) by mouth 2 (two) times daily.  Dispense: 60 tablet; Refill: 0 - methylphenidate (RITALIN) 10 MG tablet; Take 1 tablet (10 mg total) by mouth 2 (two) times daily.  Dispense: 60 tablet; Refill: 0  Continue same medication regimen. Follow up in 3 months. Pt wants to resume individual therapy.  Tonya Crane, MD 04/25/2019, 9:11 AM

## 2019-04-26 DIAGNOSIS — G9009 Other idiopathic peripheral autonomic neuropathy: Secondary | ICD-10-CM | POA: Diagnosis not present

## 2019-04-26 DIAGNOSIS — E1140 Type 2 diabetes mellitus with diabetic neuropathy, unspecified: Secondary | ICD-10-CM | POA: Diagnosis not present

## 2019-04-26 DIAGNOSIS — E785 Hyperlipidemia, unspecified: Secondary | ICD-10-CM | POA: Diagnosis not present

## 2019-04-30 ENCOUNTER — Telehealth: Payer: Self-pay

## 2019-04-30 NOTE — Telephone Encounter (Signed)
Virtual Visit Pre-Appointment Phone Call  "(Name), I am calling you today to discuss your upcoming appointment. We are currently trying to limit exposure to the virus that causes COVID-19 by seeing patients at home rather than in the office."  1. "What is the BEST phone number to call the day of the visit?" - include this in appointment notes  2. "Do you have or have access to (through a family member/friend) a smartphone with video capability that we can use for your visit?" a. If yes - list this number in appt notes as "cell" (if different from BEST phone #) and list the appointment type as a VIDEO visit in appointment notes b. If no - list the appointment type as a PHONE visit in appointment notes  Confirm consent - "In the setting of the current Covid19 crisis, you are scheduled for a (phone or video) visit with your provider on (date) at (time).  Just as we do with many in-office visits, in order for you to participate in this visit, we must obtain consent.  If you'd like, I can send this to your mychart (if signed up) or email for you to review.  Otherwise, I can obtain your verbal consent now.  All virtual visits are billed to your insurance company just like a normal visit would be.  By agreeing to a virtual visit, we'd like you to understand that the technology does not allow for your provider to perform an examination, and thus may limit your provider's ability to fully assess your condition. If your provider identifies any concerns that need to be evaluated in person, we will make arrangements to do so.  Finally, though the technology is pretty good, we cannot assure that it will always work on either your or our end, and in the setting of a video visit, we may have to convert it to a phone-only visit.  In either situation, we cannot ensure that we have a secure connection.  Are you willing to proceed?" STAFF: Did the patient verbally acknowledge consent to telehealth visit? Document  YES/NO here:  3. Advise patient to be prepared - "Two hours prior to your appointment, go ahead and check your blood pressure, pulse, oxygen saturation, and your weight (if you have the equipment to check those) and write them all down. When your visit starts, your provider will ask you for this information. If you have an Apple Watch or Kardia device, please plan to have heart rate information ready on the day of your appointment. Please have a pen and paper handy nearby the day of the visit as well."  4. Give patient instructions for MyChart download to smartphone OR Doximity/Doxy.me as below if video visit (depending on what platform provider is using)  5. Inform patient they will receive a phone call 15 minutes prior to their appointment time (may be from unknown caller ID) so they should be prepared to answer    [place] has been deemed a candidate for a follow-up tele-health visit to limit community exposure during the Covid-19 pandemic. I spoke with the patient via phone to ensure availability of phone/video source, confirm preferred email & phone number, and discuss instructions and expectations.  I reminded [name] to be prepared with any vital sign and/or heart rhythm information that could potentially be obtained via home monitoring, at the time of her visit. I reminded [name] to expect a phone call prior to her visit.  Dorothey Baseman 04/30/2019 1:55 PM   INSTRUCTIONS FOR DOWNLOADING THE MYCHART APP TO SMARTPHONE  - The patient must first make sure to have activated MyChart and know their login information - If Apple, go to [employer] and type in MyChart in the search bar and download the app. If Android, ask patient to go to [employer] and type in [place] in the search bar and download the app. The app is free but as with any other app downloads, their phone may require them to verify saved payment information or Apple/Android  password.  - The patient will need to then log into the app with their MyChart username and password, and select [facility] as their healthcare provider to link the account. When it is time for your visit, go to the MyChart app, find appointments, and click Begin Video Visit. Be sure to Select Allow for your device to access the Microphone and Camera for your visit. You will then be connected, and your provider will be with you shortly.  **If they have any issues connecting, or need assistance please contact MyChart service desk [phone])**  **If using a computer, in order to ensure the best quality for their visit they will need to use either of the following Internet Browsers: [employer], or Google Chrome**  IF USING DOXIMITY or DOXY.ME - The patient will receive a link just prior to their visit by text.     FULL LENGTH CONSENT FOR TELE-HEALTH VISIT   I hereby voluntarily request, consent and authorize [facility] and its employed or contracted physicians, physician assistants, nurse practitioners or other licensed health care professionals (the Practitioner), to provide me with telemedicine health care services (the "Services") as deemed necessary by the treating Practitioner. I acknowledge and consent to receive the Services by the Practitioner via telemedicine. I understand that the telemedicine visit will involve communicating with the Practitioner through live audiovisual communication technology and the disclosure of certain medical information by electronic transmission. I acknowledge that I have been given the opportunity to request an in-person assessment or other available alternative prior to the telemedicine visit and am voluntarily participating in the telemedicine visit.  I understand that I have the right to withhold or withdraw my consent to the use of telemedicine in the course of my care at any time, without affecting my right to future care or treatment,  and that the Practitioner or I may terminate the telemedicine visit at any time. I understand that I have the right to inspect all information obtained and/or recorded in the course of the telemedicine visit and may receive copies of available information for a reasonable fee.  I understand that some of the potential risks of receiving the Services via telemedicine include:  Marland Kitchen Delay or interruption in medical evaluation due to technological equipment failure or disruption; . Information transmitted may not be sufficient (e.g. poor resolution of images) to allow for appropriate medical decision making by the Practitioner; and/or  . In rare instances, security protocols could fail, causing a breach of personal health information.  Furthermore, I acknowledge that it is my responsibility to provide information about my medical history, conditions and care that is complete and accurate to the best of my ability. I acknowledge that Practitioner's advice, recommendations, and/or decision may be based on factors not within their control, such as incomplete or inaccurate data provided by me or distortions of diagnostic images or specimens that may result from electronic transmissions. I understand that the practice  of medicine is not an Chief Strategy Officer and that Practitioner makes no warranties or guarantees regarding treatment outcomes. I acknowledge that I will receive a copy of this consent concurrently upon execution via email to the email address I last provided but may also request a printed copy by calling the office of [facility].    I understand that my insurance will be billed for this visit.   I have read or had this consent read to me. . I understand the contents of this consent, which adequately explains the benefits and risks of the Services being provided via telemedicine.  . I have been provided ample opportunity to ask questions regarding this consent and the Services and have had my questions  answered to my satisfaction. . I give my informed consent for the services to be provided through the use of telemedicine in my medical care  By participating in this telemedicine visit I agree to the above.

## 2019-05-01 DIAGNOSIS — R69 Illness, unspecified: Secondary | ICD-10-CM | POA: Diagnosis not present

## 2019-05-06 NOTE — Progress Notes (Deleted)
{Choose 1 Note Type (Telehealth Visit or Telephone Visit):[phone]}   Date:  05/06/2019   ID:  Karyl, Paskins [birth year], MRN [id number]  {Patient Location:[phone]::"Home"} {Provider [phone]::"Home"}  PCP:  Asencion Noble, MD  Cardiologist:  No primary care provider on file. *** Electrophysiologist:  None   Evaluation Performed:  {Choose Visit Type:[id number]::"Follow-Up Visit"}  Chief Complaint:  ***  History of Present Illness:    [name] is a 59 y.o. female with past medical history of with past medical history of HTN, HLD, Type 2 DM, GERD, atypical chest pain, fibromyalgia, and tobacco use who presents for an overdue follow-up telehealth visit.   She was last examined by Jory Sims, DNP in 01/2017 and denied any recent chest pain or dyspnea on exertion at that time. She was interested in quitting smoking and had previously been intolerant to Chantix in the past, therefore it was recommended she try Nicotine gum and also talk with Psychiatry about possibly adjusting her Wellbutrin dose.   The patient {does/does not:200015} have symptoms concerning for COVID-19 infection (fever, chills, cough, or new shortness of breath).    Past Medical History:  Diagnosis Date  . Anxiety   . Bipolar disorder ([place])   . Complication of anesthesia   . Degenerative disc disease, lumbar   . Depression   . Diabetes mellitus   . Elevated cholesterol   . Fibromyalgia   . GERD (gastroesophageal reflux disease)   . Hypothyroidism   . Migraine   . Neuropathy   . Osteoarthritis   . PONV (postoperative nausea and vomiting)   . PTSD (post-traumatic stress disorder)    after death of son (accidental overdose)  . Thyroid disease    Past Surgical History:  Procedure Laterality Date  . BILATERAL OOPHORECTOMY    . CARPAL TUNNEL RELEASE  bilateral  . CHOLECYSTECTOMY    . COLONOSCOPY WITH PROPOFOL N/A 05/01/2012   [id number] COLON polyps/Mild diverticulosis was noted  in the sigmoid colon/Moderate sized internal hemorrhoids. path with tubular adenoma  . gallbladder    . KNEE ARTHROPLASTY Right 08/28/2017   Procedure: COMPUTER ASSISTED TOTAL KNEE ARTHROPLASTY;  Surgeon: Dereck Leep, MD;  Location: [facility];  Service: Orthopedics;  Laterality: Right;  . KNEE ARTHROSCOPY     bilateral  . KNEE ARTHROSCOPY Left 02/06/2019   Procedure: LEFT KNEE ARTHROSCOPY WITH  PARTIAL MEDIAL MENISECTOMY WITH MEDIAL CHONDRALPLASTY;  Surgeon: Dereck Leep, MD;  Location: [facility];  Service: Orthopedics;  Laterality: Left;  . KNEE ARTHROSCOPY WITH MEDIAL MENISECTOMY Right 10/29/2015   Procedure: ARTHROSCOPY RIGHT KNEE  WITH PARTIAL MEDIAL MENISECTOMY;  Surgeon: Carole Civil, MD;  Location: [facility];  Service: Orthopedics;  Laterality: Right;  . POLYPECTOMY N/A 05/01/2012   Procedure: POLYPECTOMY;  Surgeon: Danie Binder, MD;  Location: [facility];  Service: Endoscopy;  Laterality: N/A;  . SHOULDER SURGERY  left  . VAGINAL HYSTERECTOMY       No outpatient medications have been marked as taking for the 05/07/19 encounter (Appointment) with Erma Heritage, PA-C.     Allergies:   Patient has no known allergies.   Social History   Tobacco Use  . Smoking status: Former Smoker    Packs/day: 1.00    Years: 13.00    Pack years: 13.00    Types: Cigarettes    Quit date: 02/06/2019    Years since quitting: 0.2  . Smokeless tobacco: Never Used  Substance Use Topics  . Alcohol use: No  . Drug use: No  Family Hx: The patient's family history includes Alcohol abuse in her brother; Anxiety disorder in her mother; Arthritis in an other family member; Cancer in an other family member; Cancer - Lung in her father; Colon cancer in her father; Depression in her mother and sister; Diabetes in an other family member; Drug abuse in her son, son and another family member; Heart disease in her maternal grandfather and another family member; Heart failure in her mother and sister;  Lung disease in her father and another family member; Stroke in her mother and paternal grandmother.  ROS:   Please see the history of present illness.    *** All other systems reviewed and are negative.   Prior CV studies:   The following studies were reviewed today:  NST: 05/2016  No diagnostic ST segment changes to indicate ischemia.  Small, mild intensity, fixed apical anteroseptal defect that is most consistent with breast attenuation.  This is a low risk study.  Nuclear stress EF: 81%.   Echocardiogram: 05/2016 Study Conclusions   - Left ventricle: The cavity size was normal. Wall thickness was  increased in a pattern of mild LVH. Systolic function was normal.  The estimated ejection fraction was in the range of 60% to 65%.  Wall motion was normal; there were no regional wall motion  abnormalities. Doppler parameters are consistent with abnormal  left ventricular relaxation (grade 1 diastolic dysfunction).  - Aortic valve: Mildly calcified annulus. Trileaflet.  - Mitral valve: Calcified annulus. There was trivial regurgitation.  - Right atrium: Central venous pressure (est): 3 mm Hg.  - Atrial septum: No defect or patent foramen ovale was identified.  - Tricuspid valve: There was trivial regurgitation.  - Pulmonary arteries: Systolic pressure could not be accurately  estimated.  - Pericardium, extracardiac: A prominent pericardial fat pad was  present.   Impressions:   - Mild LVH with LVEF 60-65% and grade 1 diastolic dysfunction.  Mildly calcified mitral and aortic annulus. Trivial mitral  regurgitation. Trivial tricuspid regurgitation. Prominent  pericardial fat pad.   Labs/Other Tests and Data Reviewed:    EKG:  {EKG/Telemetry Strips Reviewed:[phone]}  Recent Labs: 11/28/2018: BUN 30; Creatinine, Ser 0.73; Potassium 4.2; Sodium 136   Recent Lipid Panel No results found for: CHOL, TRIG, HDL, CHOLHDL, LDLCALC, LDLDIRECT  Wt  Readings from Last 3 Encounters:  02/06/19 215 lb (97.5 kg)  02/01/19 210 lb 9.6 oz (95.5 kg)  01/30/19 215 lb (97.5 kg)     Objective:    Vital Signs:  There were no vitals taken for this visit.   {[facility] Virtual Exam (Optional):[phone]::"VITAL SIGNS:  reviewed"}  ASSESSMENT & PLAN:    1. ***  COVID-19 Education: The signs and symptoms of COVID-19 were discussed with the patient and how to seek care for testing (follow up with PCP or arrange E-visit).  ***The importance of social distancing was discussed today.  Time:   Today, I have spent *** minutes with the patient with telehealth technology discussing the above problems.     Medication Adjustments/Labs and Tests Ordered: Current medicines are reviewed at length with the patient today.  Concerns regarding medicines are outlined above.   Tests Ordered: No orders of the defined types were placed in this encounter.   Medication Changes: No orders of the defined types were placed in this encounter.   Follow Up:  {F/U Format:[phone]} {follow up:15908}  Signed, Erma Heritage, PA-C  05/06/2019 4:55 PM    [facility]

## 2019-05-07 ENCOUNTER — Telehealth: Payer: Medicare HMO | Admitting: Student

## 2019-05-07 DIAGNOSIS — M47816 Spondylosis without myelopathy or radiculopathy, lumbar region: Secondary | ICD-10-CM | POA: Diagnosis not present

## 2019-05-28 ENCOUNTER — Ambulatory Visit: Payer: Medicare HMO | Admitting: Neurology

## 2019-05-28 ENCOUNTER — Telehealth: Payer: Self-pay | Admitting: *Deleted

## 2019-05-28 ENCOUNTER — Other Ambulatory Visit: Payer: Self-pay | Admitting: Neurology

## 2019-05-28 NOTE — Telephone Encounter (Signed)
Pt cancelled follow up same day due to sickness. She woke up vomiting.

## 2019-05-30 ENCOUNTER — Other Ambulatory Visit: Payer: Self-pay | Admitting: Neurology

## 2019-06-05 DIAGNOSIS — M47816 Spondylosis without myelopathy or radiculopathy, lumbar region: Secondary | ICD-10-CM | POA: Diagnosis not present

## 2019-06-05 DIAGNOSIS — Z79899 Other long term (current) drug therapy: Secondary | ICD-10-CM | POA: Diagnosis not present

## 2019-06-05 DIAGNOSIS — Z79891 Long term (current) use of opiate analgesic: Secondary | ICD-10-CM | POA: Diagnosis not present

## 2019-06-05 DIAGNOSIS — Z5181 Encounter for therapeutic drug level monitoring: Secondary | ICD-10-CM | POA: Diagnosis not present

## 2019-06-05 DIAGNOSIS — M542 Cervicalgia: Secondary | ICD-10-CM | POA: Diagnosis not present

## 2019-06-18 ENCOUNTER — Encounter: Payer: Self-pay | Admitting: Family Medicine

## 2019-06-18 ENCOUNTER — Telehealth (INDEPENDENT_AMBULATORY_CARE_PROVIDER_SITE_OTHER): Payer: Medicare HMO | Admitting: Family Medicine

## 2019-06-18 DIAGNOSIS — G6289 Other specified polyneuropathies: Secondary | ICD-10-CM

## 2019-06-18 MED ORDER — DULOXETINE HCL 30 MG PO CPEP: 60.0000 mg | ORAL_CAPSULE | Freq: Every day | ORAL | 11 refills | Status: DC

## 2019-06-18 MED ORDER — PREGABALIN 200 MG PO CAPS: 200.0000 mg | ORAL_CAPSULE | Freq: Three times a day (TID) | ORAL | 5 refills | Status: DC

## 2019-06-18 NOTE — Progress Notes (Signed)
PATIENT: [name] DOB: [birth year]  REASON FOR VISIT: follow up HISTORY FROM: patient  Virtual Visit via Telephone Note  I connected with [name] on 06/18/19 at 10:30 AM EDT by telephone and verified that I am speaking with the correct person using two identifiers.   I discussed the limitations, risks, security and privacy concerns of performing an evaluation and management service by telephone and the availability of in person appointments. I also discussed with the patient that there may be a patient responsible charge related to this service. The patient expressed understanding and agreed to proceed.   History of Present Illness:  [place] a 59 year old female, seen in request byher primary care physician Dr.Fagan, Roy,for evaluation of peripheral neuropathy, initial evaluation was through virtual visit on September 18, 2018.  I have reviewed and summarized the referring note from the referring physician.She had past medical history of hypothyroidism, diabetes since 2016  She presented with bilateral feet paresthesia since 2019, initially involving her toes, and top of her feet, is getting worse with ambulating, now his work is way up to ankle levels now, also began to have fingertips paresthesia,  She had a history of knee replacement, denies significant gait abnormality, no bowel and bladder incontinence,  Previously she was treated with gabapentin, which is no longer helpful, recently switched to Lyrica 75 mg twice a day, which was helpful, she denies significant side effect  She also complains of chronic neck, low back pain, radiating pain to left buttock, left lower extremity, radiating pain to bilateral shoulder and upper extremity.  Update January 28, 2019 SS: After her initial visit, she was started on Lyrica 100 mg 3 times a day, was subsequently increased after multiple telephone calls, to 200 mg 3 times a day.  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  No evidence of bilateral lumbosacral radiculopathy.  She reports that the paresthesias have improved with Lyrica.  She is waking up once per week in the middle of the night with shocklike pains to both forearms, lateral thighs from her hips to her knees.  This sensation is very localized.  She will have to rub the area, the next day it is sore to touch.  The paresthesia is not as frequent with the addition of Lyrica, when it occurs she has to take an additional Lyrica.  Recent A1c was 5.9, 1 month ago.  A few months ago, she developed neck pain, is seen by Dr. Maryjean Ka, received an injection in her neck, no longer has neck pain.  The neck pain did not coincide with the sensation of paresthesia.  She has not had any falls, she denies any changes to her bowels or bladder.  She has chronic back pain and sciatica, also fibromyalgia.  During the night, when the sensation occurs, the areas are very sensitive to touch.  She takes Robaxin before bed.  She has a torn left knee meniscus, scheduled for surgery in a few weeks.   Update June 18, 2019 ALL:   [name] is a 59 y.o. female here today for follow up for polyneuropathy. She continues Lyrica 200mg  TID and duloxetine 30mg  daily. She reports significant benefit following addition of duloxetine. Unfortunately, over the past couple of weeks, the pain has worsened. She reports the same sharp, electrical sensation of lower extremities, worse at night. She has taken an extra dose of Lyrica twice this week. She is tolerating medications  well and without adverse effects.   She reports DM is stable. Last A1C 5.9. She is followed closely by PCP and pain management. She is taking oxycodone 10mg  1-3 times daily for chronic back pain. She is on Wellbutrin 100mg  BID, Seroquel 200mg  at bedtime and clonazepam 0.5mg  TID for anxiety/depression with panic attacks. She  takes Ritalin 10mg  BID for ADD. She is followed by psychiatry.    Observations/Objective:  Generalized: Well developed, in no acute distress  Mentation: Alert oriented to time, place, history taking. Follows all commands speech and language fluent   Assessment and Plan:  59 y.o. year old female  has a past medical history of Anxiety, Bipolar disorder ([place]), Complication of anesthesia, Degenerative disc disease, lumbar, Depression, Diabetes mellitus, Elevated cholesterol, Fibromyalgia, GERD (gastroesophageal reflux disease), Hypothyroidism, Migraine, Neuropathy, Osteoarthritis, PONV (postoperative nausea and vomiting), PTSD (post-traumatic stress disorder), and Thyroid disease. here with    ICD-10-CM   1. Other polyneuropathy  G62.89    [name] reports improved neuropathy pain after adding duloxetine 30mg  to treatment plan. Unfortunately, pain has returned. She is requesting to increase duloxetine dose. We have discussed concerns of polypharmacy. She is on multiple controlled substances and psychotropic medications. PMP aware reviewed for today's visit and reveals monthly refills on all controlled substances as prescribed. She is already taking maximum dose of Lyrica. We will continue 200mg  TID. She was advised to avoid taking extra doses of this medication. I will increase duloxetine to 60mg  at bedtime. She will monitor closely for any worsening depression or signs of serotonin syndrome as discussed. She will continue close follow up with PCP , pain management and psychiatry. Healthy lifestyle habits encouraged. She will follow up in 6 months, sooner if needed.   No orders of the defined types were placed in this encounter.   Meds ordered this encounter  Medications  . DULoxetine (CYMBALTA) 30 MG capsule    Sig: Take 2 capsules (60 mg total) by mouth at bedtime.    Dispense:  60 capsule    Refill:  11    Pt needs to call [phone] to schedule f/u for ongoing refills    Order Specific  Question:   Supervising Provider    Answer:   Melvenia Beam [id number]  . pregabalin (LYRICA) 200 MG capsule    Sig: Take 1 capsule (200 mg total) by mouth 3 (three) times daily.    Dispense:  90 capsule    Refill:  5    Order Specific Question:   Supervising Provider    Answer:   Melvenia Beam [id number]     Follow Up Instructions:  I discussed the assessment and treatment plan with the patient. The patient was provided an opportunity to ask questions and all were answered. The patient agreed with the plan and demonstrated an understanding of the instructions.   The patient was advised to call back or seek an in-person evaluation if the symptoms worsen or if the condition fails to improve as anticipated.  I provided 20 minutes of non-face-to-face time during this encounter.  Patient is located at her place of residence during my chart virtual visit provider is in the office.   Debbora Presto, NP

## 2019-06-18 NOTE — Progress Notes (Signed)
I have reviewed and agreed above plan. 

## 2019-06-26 ENCOUNTER — Other Ambulatory Visit (HOSPITAL_COMMUNITY): Payer: Self-pay | Admitting: Internal Medicine

## 2019-06-26 DIAGNOSIS — Z1231 Encounter for screening mammogram for malignant neoplasm of breast: Secondary | ICD-10-CM

## 2019-07-01 ENCOUNTER — Ambulatory Visit (HOSPITAL_COMMUNITY): Payer: Medicare HMO

## 2019-07-17 ENCOUNTER — Ambulatory Visit: Payer: Medicare HMO | Admitting: Gastroenterology
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
## 2019-07-23 ENCOUNTER — Encounter (HOSPITAL_COMMUNITY): Payer: Self-pay | Admitting: Psychiatry

## 2019-07-23 ENCOUNTER — Telehealth (INDEPENDENT_AMBULATORY_CARE_PROVIDER_SITE_OTHER): Payer: Medicare HMO | Admitting: Psychiatry

## 2019-07-23 ENCOUNTER — Other Ambulatory Visit: Payer: Self-pay

## 2019-07-23 DIAGNOSIS — F3342 Major depressive disorder, recurrent, in full remission: Secondary | ICD-10-CM

## 2019-07-23 DIAGNOSIS — F900 Attention-deficit hyperactivity disorder, predominantly inattentive type: Secondary | ICD-10-CM | POA: Diagnosis not present

## 2019-07-23 DIAGNOSIS — F4310 Post-traumatic stress disorder, unspecified: Secondary | ICD-10-CM

## 2019-07-23 MED ORDER — QUETIAPINE FUMARATE 200 MG PO TABS: 200.0000 mg | ORAL_TABLET | Freq: Every day | ORAL | 0 refills | Status: DC

## 2019-07-23 MED ORDER — METHYLPHENIDATE HCL 10 MG PO TABS: 10.0000 mg | ORAL_TABLET | Freq: Two times a day (BID) | ORAL | 0 refills | Status: DC

## 2019-07-23 MED ORDER — BUPROPION HCL ER (SR) 100 MG PO TB12: 100.0000 mg | ORAL_TABLET | Freq: Two times a day (BID) | ORAL | 0 refills | Status: DC

## 2019-07-23 MED ORDER — CLONAZEPAM 0.5 MG PO TABS: 0.5000 mg | ORAL_TABLET | Freq: Three times a day (TID) | ORAL | 2 refills | Status: DC | PRN

## 2019-07-23 MED ORDER — METHYLPHENIDATE HCL 10 MG PO TABS
10.0000 mg | ORAL_TABLET | Freq: Two times a day (BID) | ORAL | 0 refills | Status: DC
Start: 2019-10-04 — End: 2019-11-18

## 2019-08-02 ENCOUNTER — Ambulatory Visit: Payer: Medicare HMO | Admitting: Gastroenterology

## 2019-08-13 ENCOUNTER — Telehealth: Payer: Self-pay | Admitting: Family Medicine

## 2019-08-13 MED ORDER — PREGABALIN 200 MG PO CAPS: 200.0000 mg | ORAL_CAPSULE | Freq: Three times a day (TID) | ORAL | 0 refills | Status: DC

## 2019-08-13 MED ORDER — DULOXETINE HCL 30 MG PO CPEP: 60.0000 mg | ORAL_CAPSULE | Freq: Every day | ORAL | 0 refills | Status: DC

## 2019-08-13 NOTE — Telephone Encounter (Signed)
I called pt to discuss. No answer, left a message asking her to call us back.  Portia, please try to call pt again tomorrow. See Amy, NP's note.

## 2019-08-13 NOTE — Addendum Note (Signed)
Addended by: Debbora Presto L on: 08/13/2019 04:41 PM   Modules accepted: Orders

## 2019-08-13 NOTE — Telephone Encounter (Signed)
Pt has called to report that she believes she left her pregabalin (LYRICA) 200 MG capsule & DULoxetine (CYMBALTA) 30 MG capsule in [place].  Pt states she is not due a refill until 06-02.  Pt is asking if she can get a refill on the 2 medications until 06-02.  Pt states due to her Fibrro Myalgia she can not be without the medications.  Please call

## 2019-08-14 NOTE — Telephone Encounter (Signed)
Attempted to call the patient w/o success. Lm on the VM for the patient to call back

## 2019-08-20 NOTE — Telephone Encounter (Signed)
Attempted to contact the patient re: message below. Pt was not available. LM on the VM for the patient to call back.

## 2019-09-06 DIAGNOSIS — M1712 Unilateral primary osteoarthritis, left knee: Secondary | ICD-10-CM | POA: Diagnosis not present

## 2019-09-06 DIAGNOSIS — Z9889 Other specified postprocedural states: Secondary | ICD-10-CM | POA: Diagnosis not present

## 2019-09-06 DIAGNOSIS — M7052 Other bursitis of knee, left knee: Secondary | ICD-10-CM | POA: Diagnosis not present
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
## 2019-10-21 ENCOUNTER — Other Ambulatory Visit: Payer: Self-pay

## 2019-10-21 ENCOUNTER — Encounter (HOSPITAL_COMMUNITY): Payer: Self-pay | Admitting: Psychiatry

## 2019-10-21 ENCOUNTER — Telehealth (INDEPENDENT_AMBULATORY_CARE_PROVIDER_SITE_OTHER): Payer: Medicare HMO | Admitting: Psychiatry

## 2019-10-21 DIAGNOSIS — F900 Attention-deficit hyperactivity disorder, predominantly inattentive type: Secondary | ICD-10-CM | POA: Diagnosis not present

## 2019-10-21 DIAGNOSIS — F3342 Major depressive disorder, recurrent, in full remission: Secondary | ICD-10-CM

## 2019-10-21 DIAGNOSIS — F4310 Post-traumatic stress disorder, unspecified: Secondary | ICD-10-CM | POA: Diagnosis not present

## 2019-10-21 MED ORDER — CLONAZEPAM 0.5 MG PO TABS: 0.5000 mg | ORAL_TABLET | Freq: Three times a day (TID) | ORAL | 2 refills | Status: DC | PRN

## 2019-10-21 MED ORDER — BUPROPION HCL ER (SR) 100 MG PO TB12: 100.0000 mg | ORAL_TABLET | Freq: Two times a day (BID) | ORAL | 0 refills | Status: DC

## 2019-10-21 MED ORDER — QUETIAPINE FUMARATE 200 MG PO TABS: 200.0000 mg | ORAL_TABLET | Freq: Every day | ORAL | 0 refills | Status: DC

## 2019-10-21 MED ORDER — METHYLPHENIDATE HCL 10 MG PO TABS
10.0000 mg | ORAL_TABLET | Freq: Two times a day (BID) | ORAL | 0 refills | Status: DC
Start: 2019-10-21 — End: 2019-11-18

## 2019-10-21 MED ORDER — METHYLPHENIDATE HCL 10 MG PO TABS
10.0000 mg | ORAL_TABLET | Freq: Two times a day (BID) | ORAL | 0 refills | Status: DC
Start: 2019-12-20 — End: 2020-01-13

## 2019-10-21 MED ORDER — METHYLPHENIDATE HCL 10 MG PO TABS
10.0000 mg | ORAL_TABLET | Freq: Every day | ORAL | 0 refills | Status: DC
Start: 2019-11-20 — End: 2019-11-27

## 2019-10-21 NOTE — Patient Instructions (Signed)
1. Continue bupropion 100 mg BID (she prefers SR) 2. ContinueQuetiapine200mg  at night 3.ContinueRitalin 10 mg twice a day   4. Continue clonazepam 0.5 mg three times a day for anxiety 5.Next appointment:10/25 at 9:10

## 2019-11-18 ENCOUNTER — Encounter: Payer: Self-pay | Admitting: Neurology

## 2019-11-18 ENCOUNTER — Ambulatory Visit (INDEPENDENT_AMBULATORY_CARE_PROVIDER_SITE_OTHER): Payer: Medicare HMO | Admitting: Neurology

## 2019-11-18 ENCOUNTER — Other Ambulatory Visit: Payer: Self-pay

## 2019-11-18 ENCOUNTER — Ambulatory Visit: Payer: Medicare HMO | Admitting: Neurology

## 2019-11-18 VITALS — BP 123/69 | HR 75 | Ht 66.0 in | Wt 213.5 lb

## 2019-11-18 DIAGNOSIS — Z79899 Other long term (current) drug therapy: Secondary | ICD-10-CM

## 2019-11-18 DIAGNOSIS — E1151 Type 2 diabetes mellitus with diabetic peripheral angiopathy without gangrene: Secondary | ICD-10-CM | POA: Diagnosis not present

## 2019-11-18 DIAGNOSIS — R269 Unspecified abnormalities of gait and mobility: Secondary | ICD-10-CM

## 2019-11-18 DIAGNOSIS — M5442 Lumbago with sciatica, left side: Secondary | ICD-10-CM | POA: Diagnosis not present

## 2019-11-18 DIAGNOSIS — G8929 Other chronic pain: Secondary | ICD-10-CM | POA: Diagnosis not present

## 2019-11-18 MED ORDER — DULOXETINE HCL 60 MG PO CPEP: 60.0000 mg | ORAL_CAPSULE | Freq: Every day | ORAL | 4 refills | Status: AC

## 2019-11-18 MED ORDER — PREGABALIN 200 MG PO CAPS
200.0000 mg | ORAL_CAPSULE | Freq: Three times a day (TID) | ORAL | 4 refills | Status: DC
Start: 2019-11-18 — End: 2020-05-21

## 2019-11-18 NOTE — Progress Notes (Signed)
[name] a 59 year old female, seen in request byher primary care physician Dr.Fagan, Roy,for evaluation of peripheral neuropathy, initial evaluation was through virtual visit on September 18, 2018.  I have reviewed and summarized the referring note from the referring physician.She had past medical history of hypothyroidism, diabetes since 2016  She presented with bilateral feet paresthesia since 2019, initially involving her toes, and top of her feet, is getting worse with ambulating, now his work is way up to ankle levels now, also began to have fingertips paresthesia,  She had a history of knee replacement, denies significant gait abnormality, no bowel and bladder incontinence,  Previously she was treated with gabapentin, which is no longer helpful, recently switched to Lyrica 75 mg twice a day, which was helpful, she denies significant side effect  She also complains of chronic neck, low back pain, radiating pain to left buttock, left lower extremity, radiating pain to bilateral shoulder and upper extremity.  Update January 28, 2019 SS: After her initial visit, she was started on Lyrica 100 mg 3 times a day, was subsequently increased after multiple telephone calls, to 200 mg 3 times a day.  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  No evidence of bilateral lumbosacral radiculopathy.  She reports that the paresthesias have improved with Lyrica.  She is waking up once per week in the middle of the night with shocklike pains to both forearms, lateral thighs from her hips to her knees.  This sensation is very localized.  She will have to rub the area, the next day it is sore to touch.  The paresthesia is not as frequent with the addition of Lyrica, when it occurs she has to take an additional Lyrica.  Recent A1c was 5.9, 1 month ago.  A few months ago, she developed neck pain, is seen  by Dr. Maryjean Ka, received an injection in her neck, no longer has neck pain.  The neck pain did not coincide with the sensation of paresthesia.  She has not had any falls, she denies any changes to her bowels or bladder.  She has chronic back pain and sciatica, also fibromyalgia.  During the night, when the sensation occurs, the areas are very sensitive to touch.  She takes Robaxin before bed.  She has a torn left knee meniscus, scheduled for surgery in a few weeks.  UPDATE November 18 2019: The diagnosis of peripheral neuropathy was confirmed by EMG nerve conduction study in October 2020, mild length dependent axonal sensorimotor polyneuropathy  Most consistent with her history of diabetes, extensive laboratory evaluation in 2020 showed normal or negative K24, RPR, folic acid, protein electrophoresis, C-reactive protein, ESR,  She was also under pain management for low back pain, was on oxycodone, no longer taking it,  She is seeing psychiatrist for depression, anxiety, attention deficit disorder, on polypharmacy treatment, this includes Wellbutrin 100 mg twice a day, Seroquel 200 mg at bedtime, clonazepam 0.5 mg 3 times daily, Reglan 10 mg twice a day  For her neuropathy, higher dose of Lyrica up to 300 mg 3 times daily plus Cymbalta 30 mg 2 tablets a day has provided excellent help  We personally reviewed MRI of lumbar spine in March 2019, minimum progressive degenerative spondylosis, most noticeable at L3-4, mild left recess stenosis, with no significant canal or foraminal stenosis  REVIEW OF SYSTEMS: Full 14 system review of systems performed and notable only for as above All other review of  systems were negative.  ALLERGIES: No Known Allergies  HOME MEDICATIONS: Current Outpatient Medications  Medication Sig Dispense Refill  . aspirin EC 81 MG tablet Take 81 mg by mouth daily.     Marland Kitchen buPROPion (WELLBUTRIN SR) 100 MG 12 hr tablet Take 1 tablet (100 mg total) by mouth 2 (two) times daily.  180 tablet 0  . canagliflozin (INVOKANA) 300 MG TABS tablet Take 300 mg by mouth daily. 30 tablet   . clonazePAM (KLONOPIN) 0.5 MG tablet Take 1 tablet (0.5 mg total) by mouth 3 (three) times daily as needed for anxiety. 90 tablet 2  . diclofenac sodium (VOLTAREN) 1 % GEL Apply 2 g topically 4 (four) times daily as needed (joint pain).    . Dulaglutide (TRULICITY) 1.5 IE/3.3IR SOPN Inject 1.5 mg into the skin every Saturday.     . DULoxetine (CYMBALTA) 60 MG capsule Take 1 capsule (60 mg total) by mouth at bedtime. 90 capsule 4  . glipiZIDE (GLUCOTROL XL) 5 MG 24 hr tablet Take 5 mg by mouth daily with breakfast.    . ibuprofen (IBU) 800 MG tablet Take 800 mg by mouth every 8 (eight) hours as needed for moderate pain.     Marland Kitchen levocetirizine (XYZAL) 5 MG tablet Take 5 mg by mouth daily as needed for allergies.     Marland Kitchen levothyroxine (SYNTHROID, LEVOTHROID) 150 MCG tablet Take 150 mcg by mouth daily before breakfast.    . metFORMIN (GLUCOPHAGE-XR) 500 MG 24 hr tablet Take 1,000 mg by mouth in the morning and at bedtime.    Derrill Memo ON 11/20/2019] methylphenidate (RITALIN) 10 MG tablet Take 1 tablet (10 mg total) by mouth daily before breakfast. 30 tablet 0  . [START ON 12/20/2019] methylphenidate (RITALIN) 10 MG tablet Take 1 tablet (10 mg total) by mouth in the morning and at bedtime. 60 tablet 0  . pantoprazole (PROTONIX) 40 MG tablet Take 40 mg by mouth daily as needed (acid reflux).     . pioglitazone (ACTOS) 15 MG tablet Take 15 mg by mouth daily.    . pregabalin (LYRICA) 200 MG capsule Take 1 capsule (200 mg total) by mouth 3 (three) times daily. 270 capsule 4  . QUEtiapine (SEROQUEL) 200 MG tablet Take 1 tablet (200 mg total) by mouth at bedtime. 90 tablet 0  . simvastatin (ZOCOR) 40 MG tablet Take 1 tablet (40 mg total) by mouth daily. (Patient taking differently: Take 40 mg by mouth at bedtime. ) 30 tablet   . SUMAtriptan (IMITREX) 100 MG tablet One tablet po prn migraine.  May repeat in 2 hrs if  needed.  Do not exceed 200 mg per 24 hrs. (Patient taking differently: Take 100 mg by mouth every 2 (two) hours as needed for migraine. One tablet po prn migraine.  May repeat in 2 hrs if needed.  Do not exceed 200 mg per 24 hrs.) 5 tablet 0   No current facility-administered medications for this visit.    PAST MEDICAL HISTORY: Past Medical History:  Diagnosis Date  . Anxiety   . Bipolar disorder ([place])   . Complication of anesthesia   . Degenerative disc disease, lumbar   . Depression   . Diabetes mellitus   . Elevated cholesterol   . Fibromyalgia   . GERD (gastroesophageal reflux disease)   . Hypothyroidism   . Migraine   . Neuropathy   . Osteoarthritis   . PONV (postoperative nausea and vomiting)   . PTSD (post-traumatic stress disorder)    after  death of son (accidental overdose)  . Thyroid disease     PAST SURGICAL HISTORY: Past Surgical History:  Procedure Laterality Date  . BILATERAL OOPHORECTOMY    . CARPAL TUNNEL RELEASE  bilateral  . CHOLECYSTECTOMY    . COLONOSCOPY WITH PROPOFOL N/A 05/01/2012   AJO:INOMV COLON polyps/Mild diverticulosis was noted in the sigmoid colon/Moderate sized internal hemorrhoids. path with tubular adenoma  . gallbladder    . KNEE ARTHROPLASTY Right 08/28/2017   Procedure: COMPUTER ASSISTED TOTAL KNEE ARTHROPLASTY;  Surgeon: Dereck Leep, MD;  Location: [facility];  Service: Orthopedics;  Laterality: Right;  . KNEE ARTHROSCOPY     bilateral  . KNEE ARTHROSCOPY Left 02/06/2019   Procedure: LEFT KNEE ARTHROSCOPY WITH  PARTIAL MEDIAL MENISECTOMY WITH MEDIAL CHONDRALPLASTY;  Surgeon: Dereck Leep, MD;  Location: [facility];  Service: Orthopedics;  Laterality: Left;  . KNEE ARTHROSCOPY WITH MEDIAL MENISECTOMY Right 10/29/2015   Procedure: ARTHROSCOPY RIGHT KNEE  WITH PARTIAL MEDIAL MENISECTOMY;  Surgeon: Carole Civil, MD;  Location: [facility];  Service: Orthopedics;  Laterality: Right;  . POLYPECTOMY N/A 05/01/2012   Procedure: POLYPECTOMY;   Surgeon: Danie Binder, MD;  Location: [facility];  Service: Endoscopy;  Laterality: N/A;  . SHOULDER SURGERY  left  . VAGINAL HYSTERECTOMY      FAMILY HISTORY: Family History  Problem Relation Age of Onset  . Cancer Other   . Diabetes Other   . Heart disease Other   . Arthritis Other   . Stroke Mother   . Anxiety disorder Mother   . Heart failure Mother   . Depression Mother   . Cancer - Lung Father   . Lung disease Father   . Colon cancer Father        father, diagnosed in his late 67s   . Heart failure Sister   . Depression Sister   . Alcohol abuse Brother   . Lung disease Other   . Heart disease Maternal Grandfather   . Stroke Paternal Grandmother   . Drug abuse Other   . Drug abuse Son   . Drug abuse Son     SOCIAL HISTORY: Social History   Socioeconomic History  . Marital status: Single    Spouse name: Not on file  . Number of children: 2  . Years of education: 52  . Highest education level: High school graduate  Occupational History  . Occupation: unemployed    Fish farm manager: UNEMPLOYED  Tobacco Use  . Smoking status: Former Smoker    Packs/day: 1.00    Years: 13.00    Pack years: 13.00    Types: Cigarettes    Quit date: 02/06/2019    Years since quitting: 0.7  . Smokeless tobacco: Never Used  Vaping Use  . Vaping Use: Never used  Substance and Sexual Activity  . Alcohol use: No  . Drug use: No  . Sexual activity: Not Currently    Birth control/protection: Surgical  Other Topics Concern  . Not on file  Social History Narrative   Lives at home with her boyfriend and her son.   Right-handed.   No caffeine use.   Two children - one living, one deceased.   Social Determinants of Health   Financial Resource Strain:   . Difficulty of Paying Living Expenses: Not on file  Food Insecurity:   . Worried About Charity fundraiser in the Last Year: Not on file  . Ran Out of Food in the Last Year: Not on file  Transportation Needs:   . Lexicographer (Medical): Not on file  . Lack of Transportation (Non-Medical): Not on file  Physical Activity:   . Days of Exercise per Week: Not on file  . Minutes of Exercise per Session: Not on file  Stress:   . Feeling of Stress : Not on file  Social Connections:   . Frequency of Communication with Friends and Family: Not on file  . Frequency of Social Gatherings with Friends and Family: Not on file  . Attends Religious Services: Not on file  . Active Member of Clubs or Organizations: Not on file  . Attends Archivist Meetings: Not on file  . Marital Status: Not on file  Intimate Partner Violence:   . Fear of Current or Ex-Partner: Not on file  . Emotionally Abused: Not on file  . Physically Abused: Not on file  . Sexually Abused: Not on file     PHYSICAL EXAM   Vitals:   11/18/19 1122  BP: 123/69  Pulse: 75  Weight: 213 lb 8 oz (96.8 kg)  Height: _0  (1.676 m)   Not recorded     Body mass index is 34.46 kg/m.  PHYSICAL EXAMNIATION:  Gen: NAD, conversant, well nourised, well groomed        NEUROLOGICAL EXAM:  MENTAL STATUS: Speech/cognition: Awake alert oriented to history taking care of conversation   CRANIAL NERVES: CN II: Visual fields are full to confrontation. Pupils are round equal and briskly reactive to light. CN III, IV, VI: extraocular movement are normal. No ptosis. CN V: Facial sensation is intact to light touch CN VII: Face is symmetric with normal eye closure  CN VIII: Hearing is normal to causal conversation. CN IX, X: Phonation is normal. CN XI: Head turning and shoulder shrug are intact  MOTOR: There is no pronator drift of out-stretched arms. Muscle bulk and tone are normal. Muscle strength is normal.  REFLEXES: Reflexes are 1 and symmetric at the biceps, triceps, knees, and ankles. Plantar responses are flexor.  SENSORY: Mild length dependent decreased light touch pinprick to mid shin level  COORDINATION: There is  no trunk or limb dysmetria noted.  GAIT/STANCE: Can get up from seated position arm crossed, mildly antalgic due to left knee pain   DIAGNOSTIC DATA (LABS, IMAGING, TESTING) - I reviewed patient records, labs, notes, testing and imaging myself where available.   ASSESSMENT AND PLAN  [name] is a 59 y.o. female   Peripheral neuropathy Polypharmacy Chronic low back pain, radiating pain to bilateral lower extremity  Most likely related to her diabetes  Under excellent control with current medications, refill Lyrica 200 mg 3 times daily, Cymbalta 60 mg daily  Polypharmacy treatment  Emphasized importance of moderate exercise, stretching   Marcial Pacas, M.D. Ph.D.  [facility] [address], [place], [place] [address] Ph: [phone] Fax: [phone]  CC:  Asencion Noble, MD [address] [place],  [place] [address]  Asencion Noble, MD

## 2019-11-27 ENCOUNTER — Telehealth (HOSPITAL_COMMUNITY): Payer: Self-pay | Admitting: *Deleted

## 2019-11-27 ENCOUNTER — Telehealth (HOSPITAL_COMMUNITY): Payer: Self-pay | Admitting: Psychiatry

## 2019-11-27 MED ORDER — METHYLPHENIDATE HCL 10 MG PO TABS: 10.0000 mg | ORAL_TABLET | Freq: Two times a day (BID) | ORAL | 0 refills | Status: DC

## 2019-11-27 NOTE — Telephone Encounter (Signed)
There was an error on my side.  Correct instruction for methylphenidate is 10 mg twice a day. I ordered the correct one. Please contact the pharmacy to cancel the order for methylphenidate 10 mg daily. Let me know if she/pharmacy is referring to another medication.

## 2019-11-27 NOTE — Telephone Encounter (Signed)
Patient called stating that the pharmacy informed her that her medications was sent in incorrect. Per pt they stated that her 11-19-2019 script was only written for 1 time a day when pt takes it BID. Per pt pharmacy also informed her to inform provider to send the script individually due to it looking confusing with what's going on with the scripts when they receive it.

## 2019-11-27 NOTE — Telephone Encounter (Signed)
Addendum- I have utilized the [place] Controlled Substances Reporting System (PMP AWARxE) to confirm adherence regarding the patient's medication. My review reveals appropriate prescription fills.

## 2019-11-29 NOTE — Telephone Encounter (Signed)
LMOM asking patient if she was able to get her medication and if not to call office back and office number was provided.

## 2019-12-02 ENCOUNTER — Other Ambulatory Visit: Payer: Self-pay

## 2019-12-02 ENCOUNTER — Encounter: Payer: Self-pay | Admitting: Emergency Medicine

## 2019-12-02 ENCOUNTER — Ambulatory Visit
Admission: EM | Admit: 2019-12-02 | Discharge: 2019-12-02 | Disposition: A | Discharge status: Home / Self Care | Payer: Medicare HMO | Attending: Emergency Medicine | Admitting: Emergency Medicine

## 2019-12-02 DIAGNOSIS — F319 Bipolar disorder, unspecified: Secondary | ICD-10-CM | POA: Diagnosis not present

## 2019-12-02 DIAGNOSIS — M25562 Pain in left knee: Secondary | ICD-10-CM

## 2019-12-02 DIAGNOSIS — M25551 Pain in right hip: Secondary | ICD-10-CM | POA: Diagnosis not present

## 2019-12-02 DIAGNOSIS — W19XXXA Unspecified fall, initial encounter: Secondary | ICD-10-CM

## 2019-12-02 MED ORDER — MELOXICAM 15 MG PO TABS: 15.0000 mg | ORAL_TABLET | Freq: Every day | ORAL | 0 refills | Status: DC

## 2019-12-02 NOTE — ED Triage Notes (Addendum)
LT knee pain and RT hip pain after she tripped and fell a couple of days ago. Pt ambulatory to room from waiting room

## 2019-12-02 NOTE — Discharge Instructions (Signed)
We will hold off on x-rays today; return in a few days if symptoms are persisting, may consider x-ray at that time Continue conservative management of rest, ice, and elevation Ace bandage applied to LT knee Take mobic as needed for pain relief (may cause abdominal discomfort, ulcers, and GI bleeds avoid taking with other NSAIDs) Follow up with orthopedist if symptoms persists Return or go to the ER if you have any new or worsening symptoms (fever, chills, chest pain, redness, swelling, bruising, deformity, worsening symptoms despite treatment, etc...)

## 2019-12-02 NOTE — ED Provider Notes (Signed)
[place]   [id number] 12/02/19 Arrival Time: 7939  CC: LT knee and RT hip pain  SUBJECTIVE: History from: patient. [name] is a 59 y.o. female complains of LT knee and RT hip pain that began 1-2 days ago.  Trip and fall at home.  Localizes the pain to the front of LT knee and outside of RT hip.  Describes the pain as constant and throbbing in character.  Has tried OTC medications without relief.  Symptoms are made worse with weight-bearing.  Reports meniscus injury to LT knee and states today's symptoms are similar.  Complains of associated swelling and bruising to LT knee.  Denies fever, chills, erythema, weakness, numbness and tingling  ROS: As per HPI.  All other pertinent ROS negative.     Past Medical History:  Diagnosis Date  . Anxiety   . Bipolar disorder ([place])   . Complication of anesthesia   . Degenerative disc disease, lumbar   . Depression   . Diabetes mellitus   . Elevated cholesterol   . Fibromyalgia   . GERD (gastroesophageal reflux disease)   . Hypothyroidism   . Migraine   . Neuropathy   . Osteoarthritis   . PONV (postoperative nausea and vomiting)   . PTSD (post-traumatic stress disorder)    after death of son (accidental overdose)  . Thyroid disease    Past Surgical History:  Procedure Laterality Date  . BILATERAL OOPHORECTOMY    . CARPAL TUNNEL RELEASE  bilateral  . CHOLECYSTECTOMY    . COLONOSCOPY WITH PROPOFOL N/A 05/01/2012   [id number] COLON polyps/Mild diverticulosis was noted in the sigmoid colon/Moderate sized internal hemorrhoids. path with tubular adenoma  . gallbladder    . KNEE ARTHROPLASTY Right 08/28/2017   Procedure: COMPUTER ASSISTED TOTAL KNEE ARTHROPLASTY;  Surgeon: Dereck Leep, MD;  Location: [facility];  Service: Orthopedics;  Laterality: Right;  . KNEE ARTHROSCOPY     bilateral  . KNEE ARTHROSCOPY Left 02/06/2019   Procedure: LEFT KNEE ARTHROSCOPY WITH  PARTIAL MEDIAL MENISECTOMY WITH MEDIAL CHONDRALPLASTY;   Surgeon: Dereck Leep, MD;  Location: [facility];  Service: Orthopedics;  Laterality: Left;  . KNEE ARTHROSCOPY WITH MEDIAL MENISECTOMY Right 10/29/2015   Procedure: ARTHROSCOPY RIGHT KNEE  WITH PARTIAL MEDIAL MENISECTOMY;  Surgeon: Carole Civil, MD;  Location: [facility];  Service: Orthopedics;  Laterality: Right;  . POLYPECTOMY N/A 05/01/2012   Procedure: POLYPECTOMY;  Surgeon: Danie Binder, MD;  Location: [facility];  Service: Endoscopy;  Laterality: N/A;  . SHOULDER SURGERY  left  . VAGINAL HYSTERECTOMY     No Known Allergies No current facility-administered medications on file prior to encounter.   Current Outpatient Medications on File Prior to Encounter  Medication Sig Dispense Refill  . aspirin EC 81 MG tablet Take 81 mg by mouth daily.     Marland Kitchen buPROPion (WELLBUTRIN SR) 100 MG 12 hr tablet Take 1 tablet (100 mg total) by mouth 2 (two) times daily. 180 tablet 0  . canagliflozin (INVOKANA) 300 MG TABS tablet Take 300 mg by mouth daily. 30 tablet   . clonazePAM (KLONOPIN) 0.5 MG tablet Take 1 tablet (0.5 mg total) by mouth 3 (three) times daily as needed for anxiety. 90 tablet 2  . diclofenac sodium (VOLTAREN) 1 % GEL Apply 2 g topically 4 (four) times daily as needed (joint pain).    . Dulaglutide (TRULICITY) 1.5 AQ/7.6AU SOPN Inject 1.5 mg into the skin every Saturday.     . DULoxetine (CYMBALTA) 60 MG  capsule Take 1 capsule (60 mg total) by mouth at bedtime. 90 capsule 4  . glipiZIDE (GLUCOTROL XL) 5 MG 24 hr tablet Take 5 mg by mouth daily with breakfast.    . ibuprofen (IBU) 800 MG tablet Take 800 mg by mouth every 8 (eight) hours as needed for moderate pain.     Marland Kitchen levocetirizine (XYZAL) 5 MG tablet Take 5 mg by mouth daily as needed for allergies.     Marland Kitchen levothyroxine (SYNTHROID, LEVOTHROID) 150 MCG tablet Take 150 mcg by mouth daily before breakfast.    . metFORMIN (GLUCOPHAGE-XR) 500 MG 24 hr tablet Take 1,000 mg by mouth in the morning and at bedtime.    Derrill Memo ON 12/20/2019]  methylphenidate (RITALIN) 10 MG tablet Take 1 tablet (10 mg total) by mouth in the morning and at bedtime. 60 tablet 0  . methylphenidate (RITALIN) 10 MG tablet Take 1 tablet (10 mg total) by mouth 2 (two) times daily. 60 tablet 0  . pantoprazole (PROTONIX) 40 MG tablet Take 40 mg by mouth daily as needed (acid reflux).     . pioglitazone (ACTOS) 15 MG tablet Take 15 mg by mouth daily.    . pregabalin (LYRICA) 200 MG capsule Take 1 capsule (200 mg total) by mouth 3 (three) times daily. 270 capsule 4  . QUEtiapine (SEROQUEL) 200 MG tablet Take 1 tablet (200 mg total) by mouth at bedtime. 90 tablet 0  . simvastatin (ZOCOR) 40 MG tablet Take 1 tablet (40 mg total) by mouth daily. (Patient taking differently: Take 40 mg by mouth at bedtime. ) 30 tablet   . SUMAtriptan (IMITREX) 100 MG tablet One tablet po prn migraine.  May repeat in 2 hrs if needed.  Do not exceed 200 mg per 24 hrs. (Patient taking differently: Take 100 mg by mouth every 2 (two) hours as needed for migraine. One tablet po prn migraine.  May repeat in 2 hrs if needed.  Do not exceed 200 mg per 24 hrs.) 5 tablet 0   Social History   Socioeconomic History  . Marital status: Single    Spouse name: Not on file  . Number of children: 2  . Years of education: 60  . Highest education level: High school graduate  Occupational History  . Occupation: unemployed    Fish farm manager: UNEMPLOYED  Tobacco Use  . Smoking status: Former Smoker    Packs/day: 1.00    Years: 13.00    Pack years: 13.00    Types: Cigarettes    Quit date: 02/06/2019    Years since quitting: 0.8  . Smokeless tobacco: Never Used  Vaping Use  . Vaping Use: Never used  Substance and Sexual Activity  . Alcohol use: No  . Drug use: No  . Sexual activity: Not Currently    Birth control/protection: Surgical  Other Topics Concern  . Not on file  Social History Narrative   Lives at home with her boyfriend and her son.   Right-handed.   No caffeine use.   Two children  - one living, one deceased.   Social Determinants of Health   Financial Resource Strain:   . Difficulty of Paying Living Expenses: Not on file  Food Insecurity:   . Worried About Charity fundraiser in the Last Year: Not on file  . Ran Out of Food in the Last Year: Not on file  Transportation Needs:   . Lack of Transportation (Medical): Not on file  . Lack of Transportation (Non-Medical): Not on  file  Physical Activity:   . Days of Exercise per Week: Not on file  . Minutes of Exercise per Session: Not on file  Stress:   . Feeling of Stress : Not on file  Social Connections:   . Frequency of Communication with Friends and Family: Not on file  . Frequency of Social Gatherings with Friends and Family: Not on file  . Attends Religious Services: Not on file  . Active Member of Clubs or Organizations: Not on file  . Attends Archivist Meetings: Not on file  . Marital Status: Not on file  Intimate Partner Violence:   . Fear of Current or Ex-Partner: Not on file  . Emotionally Abused: Not on file  . Physically Abused: Not on file  . Sexually Abused: Not on file   Family History  Problem Relation Age of Onset  . Cancer Other   . Diabetes Other   . Heart disease Other   . Arthritis Other   . Stroke Mother   . Anxiety disorder Mother   . Heart failure Mother   . Depression Mother   . Cancer - Lung Father   . Lung disease Father   . Colon cancer Father        father, diagnosed in his late 18s   . Heart failure Sister   . Depression Sister   . Alcohol abuse Brother   . Lung disease Other   . Heart disease Maternal Grandfather   . Stroke Paternal Grandmother   . Drug abuse Other   . Drug abuse Son   . Drug abuse Son     OBJECTIVE:  Vitals:   12/02/19 1404 12/02/19 1406  BP: 125/70   Pulse: 76   Resp: 20   Temp: 98.9 F (37.2 C)   TempSrc: Oral   SpO2: 94%   Weight:  216 lb (98 kg)  Height:  5\' 6"  (1.676 m)    General appearance: ALERT; in no acute  distress.  Head: NCAT Lungs: Normal respiratory effort Musculoskeletal: RT hip and LT knee Inspection: LT knee with swelling and light ecchymosis over anterior medial aspect Palpation: diffusely TTP over anterior medial knee; TTP over lateral hip ROM: FROM active and passive Strength: 5/5 knee flexion, 5/5 knee extension Skin: warm and dry Neurologic: Ambulates without difficulty; Sensation intact about the lower extremities Psychological: alert and cooperative; normal mood and affect   ASSESSMENT & PLAN:  1. Right hip pain   2. Acute pain of left knee   3. Fall, initial encounter     Meds ordered this encounter  Medications  . meloxicam (MOBIC) 15 MG tablet    Sig: Take 1 tablet (15 mg total) by mouth daily.    Dispense:  15 tablet    Refill:  0    Order Specific Question:   Supervising Provider    Answer:   Raylene Everts [[id number]]   We will hold off on x-rays today; return in a few days if symptoms are persisting, may consider x-ray at that time Continue conservative management of rest, ice, and elevation Ace bandage applied to LT knee Take mobic as needed for pain relief (may cause abdominal discomfort, ulcers, and GI bleeds avoid taking with other NSAIDs) Follow up with orthopedist if symptoms persists Return or go to the ER if you have any new or worsening symptoms (fever, chills, chest pain, redness, swelling, bruising, deformity, worsening symptoms despite treatment, etc...)    Reviewed expectations re: course of current medical  issues. Questions answered. Outlined signs and symptoms indicating need for more acute intervention. Patient verbalized understanding. After Visit Summary given.    Lestine Box, PA-C 12/02/19 1447

## 2020-01-07 ENCOUNTER — Encounter: Payer: Self-pay | Admitting: Emergency Medicine

## 2020-01-07 ENCOUNTER — Emergency Department
Admission: EM | Admit: 2020-01-07 | Discharge: 2020-01-07 | Disposition: A | Discharge status: Home / Self Care | Payer: Medicare HMO | Attending: Emergency Medicine | Admitting: Emergency Medicine

## 2020-01-07 ENCOUNTER — Emergency Department: Payer: Medicare HMO

## 2020-01-07 DIAGNOSIS — E039 Hypothyroidism, unspecified: Secondary | ICD-10-CM | POA: Insufficient documentation

## 2020-01-07 DIAGNOSIS — S3992XA Unspecified injury of lower back, initial encounter: Secondary | ICD-10-CM | POA: Diagnosis present

## 2020-01-07 DIAGNOSIS — Z96653 Presence of artificial knee joint, bilateral: Secondary | ICD-10-CM | POA: Diagnosis not present

## 2020-01-07 DIAGNOSIS — E119 Type 2 diabetes mellitus without complications: Secondary | ICD-10-CM | POA: Insufficient documentation

## 2020-01-07 DIAGNOSIS — Z7984 Long term (current) use of oral hypoglycemic drugs: Secondary | ICD-10-CM | POA: Insufficient documentation

## 2020-01-07 DIAGNOSIS — Z7982 Long term (current) use of aspirin: Secondary | ICD-10-CM | POA: Insufficient documentation

## 2020-01-07 DIAGNOSIS — M545 Low back pain, unspecified: Secondary | ICD-10-CM | POA: Diagnosis not present

## 2020-01-07 DIAGNOSIS — Z79890 Hormone replacement therapy (postmenopausal): Secondary | ICD-10-CM | POA: Diagnosis not present

## 2020-01-07 DIAGNOSIS — W01198A Fall on same level from slipping, tripping and stumbling with subsequent striking against other object, initial encounter: Secondary | ICD-10-CM | POA: Insufficient documentation

## 2020-01-07 DIAGNOSIS — S32010A Wedge compression fracture of first lumbar vertebra, initial encounter for closed fracture: Secondary | ICD-10-CM | POA: Diagnosis not present

## 2020-01-07 DIAGNOSIS — Z87891 Personal history of nicotine dependence: Secondary | ICD-10-CM | POA: Diagnosis not present

## 2020-01-07 DIAGNOSIS — Z79899 Other long term (current) drug therapy: Secondary | ICD-10-CM | POA: Diagnosis not present

## 2020-01-07 MED ORDER — LIDOCAINE 5 % EX PTCH: 1.0000 | MEDICATED_PATCH | Freq: Two times a day (BID) | CUTANEOUS | 0 refills | Status: DC

## 2020-01-07 MED ORDER — OXYCODONE-ACETAMINOPHEN 5-325 MG PO TABS
1.0000 | ORAL_TABLET | Freq: Once | ORAL | Status: AC
  Administered 2020-01-07: 1 via ORAL
  Filled 2020-01-07: qty 1

## 2020-01-07 MED ORDER — LIDOCAINE 5 % EX PTCH
1.0000 | MEDICATED_PATCH | CUTANEOUS | Status: DC
  Administered 2020-01-07: 1 via TRANSDERMAL
  Filled 2020-01-07: qty 1

## 2020-01-07 MED ORDER — OXYCODONE-ACETAMINOPHEN 7.5-325 MG PO TABS: 1.0000 | ORAL_TABLET | Freq: Four times a day (QID) | ORAL | 0 refills | Status: AC | PRN

## 2020-01-07 NOTE — ED Triage Notes (Signed)
Says back pain since she fell 4-5 days ago. Says her cat got behind  Her and she fell back against fridge.

## 2020-01-07 NOTE — ED Notes (Signed)
Pt reports increasing back pain after tripping over her cat 4-5 days ago. Pt states when she tripped she fell backward and hit the refrigerator and the floor. Pt is ambulatory without difficult.

## 2020-01-07 NOTE — Discharge Instructions (Addendum)
Follow discharge care instruction take medication as directed.  Call orthopedic clinic tomorrow and tell them you are a follow-up from the emergency room.  Be advised medication may cause drowsiness.
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
## 2020-01-07 NOTE — ED Provider Notes (Signed)
[facility] Emergency Department Provider Note   ____________________________________________   First MD Initiated Contact with Patient 01/07/20 1415     (approximate)  I have reviewed the triage vital signs and the nursing notes.   HISTORY  Chief Complaint Back Pain    HPI [name] is a 59 y.o. female patient presents with low back pain secondary to a fall   For 5 days ago.  Patient denies radicular component to her back pain.  Patient denies bladder bowel dysfunction.  Patient rates pain 7/10.  Patient scribed pain is "achy".  No relief with over-the-counter medications.      Past Medical History:  Diagnosis Date  . Anxiety   . Bipolar disorder ([place])   . Complication of anesthesia   . Degenerative disc disease, lumbar   . Depression   . Diabetes mellitus   . Elevated cholesterol   . Fibromyalgia   . GERD (gastroesophageal reflux disease)   . Hypothyroidism   . Migraine   . Neuropathy   . Osteoarthritis   . PONV (postoperative nausea and vomiting)   . PTSD (post-traumatic stress disorder)    after death of son (accidental overdose)  . Thyroid disease     Patient Active Problem List   Diagnosis Date Noted  . Gait abnormality 11/18/2019  . Chronic low back pain with left-sided sciatica 11/18/2019  . Polypharmacy 11/18/2019  . MDD (major depressive disorder), recurrent, in partial remission ([place]) 04/25/2019  . Attention deficit hyperactivity disorder (ADHD), predominantly inattentive type 04/25/2019  . Paresthesia 01/28/2019  . Peripheral neuropathy 09/18/2018  . MDD (major depressive disorder), recurrent episode, mild ([place]) 07/18/2018  . Diarrhea 04/04/2018  . Status post total right knee replacement 08/28/2017  . 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 Palpitations 06/01/2016  . Hyperlipidemia 06/01/2016  . Tobacco abuse 06/01/2016  . Dyspnea on exertion  05/16/2016  . Left leg swelling 05/16/2016  . Diabetes mellitus ([place]) 05/16/2016  . Chest pain   . Swelling   . Acute medial meniscus tear of left knee   . Post traumatic stress disorder (PTSD) 11/08/2014  . MDD (major depressive disorder), recurrent severe, without psychosis ([place]) 11/08/2014  . Substance-induced psychotic disorder with hallucinations ([place]) 11/08/2014  . Panic disorder 11/08/2014  . Chronic pain syndrome 11/21/2013  . Primary osteoarthritis of both knees 11/21/2013  . Spinal stenosis of lumbar region 11/21/2013  . Difficulty in walking(719.7) 10/14/2013  . Sciatica associated with disorder of lumbar spine 09/17/2013  . Lower back pain 08/08/2013  . Lateral meniscus, posterior horn derangement 06/13/2013  . Arthritis of right knee 06/13/2013  . S/P medial meniscectomy of right knee 06/13/2013  . Left shoulder pain 08/28/2012  . Rotator cuff syndrome of left shoulder 08/28/2012  . Rotator cuff tear 08/28/2012  . Pes anserinus bursitis 05/16/2012  . Encounter for screening colonoscopy 04/16/2012  . Fatty liver 04/16/2012  . Back pain 07/26/2011  . Knee bursitis, left 07/26/2011  . OA (osteoarthritis) of knee 07/26/2011  . Spinal stenosis 10/28/2010  . Medial meniscus, posterior horn derangement 08/12/2010  . Knee pain 08/04/2010  . Sciatica 08/04/2010  . H N P-LUMBAR 01/14/2010  . 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 LUMBOSACRAL STRAIN 05/12/2009  . DERANGEMENT OF POSTERIOR HORN OF MEDIAL MENISCUS 01/19/2009  . BACK PAIN 01/19/2009  . LOWER LEG, ARTHRITIS, DEGEN./OSTEO 12/02/2008  . JOINT EFFUSION, KNEE 11/10/2008  . ANSERINE BURSITIS, RIGHT 11/10/2008  . DERANGEMENT MENISCUS 10/15/2008  .  KNEE PAIN 10/15/2008    Past Surgical History:  Procedure Laterality Date  . BILATERAL OOPHORECTOMY    . CARPAL TUNNEL RELEASE  bilateral  . CHOLECYSTECTOMY    . COLONOSCOPY WITH PROPOFOL N/A 05/01/2012   JOA:CZYSA COLON polyps/Mild diverticulosis was noted in the  sigmoid colon/Moderate sized internal hemorrhoids. path with tubular adenoma  . gallbladder    . KNEE ARTHROPLASTY Right 08/28/2017   Procedure: COMPUTER ASSISTED TOTAL KNEE ARTHROPLASTY;  Surgeon: Dereck Leep, MD;  Location: [facility] ORS;  Service: Orthopedics;  Laterality: Right;  . KNEE ARTHROSCOPY     bilateral  . KNEE ARTHROSCOPY Left 02/06/2019   Procedure: LEFT KNEE ARTHROSCOPY WITH  PARTIAL MEDIAL MENISECTOMY WITH MEDIAL CHONDRALPLASTY;  Surgeon: Dereck Leep, MD;  Location: [facility] ORS;  Service: Orthopedics;  Laterality: Left;  . KNEE ARTHROSCOPY WITH MEDIAL MENISECTOMY Right 10/29/2015   Procedure: ARTHROSCOPY RIGHT KNEE  WITH PARTIAL MEDIAL MENISECTOMY;  Surgeon: Carole Civil, MD;  Location: [facility];  Service: Orthopedics;  Laterality: Right;  . POLYPECTOMY N/A 05/01/2012   Procedure: POLYPECTOMY;  Surgeon: Danie Binder, MD;  Location: [facility] ORS;  Service: Endoscopy;  Laterality: N/A;  . SHOULDER SURGERY  left  . VAGINAL HYSTERECTOMY      Prior to Admission medications   Medication Sig Start Date End Date Taking? Authorizing Provider  aspirin EC 81 MG tablet Take 81 mg by mouth daily.     [provider]  buPROPion (WELLBUTRIN SR) 100 MG 12 hr tablet Take 1 tablet (100 mg total) by mouth 2 (two) times daily. 10/21/19   Norman Clay, MD  canagliflozin (INVOKANA) 300 MG TABS tablet Take 300 mg by mouth daily. 11/10/14   Niel Hummer, NP  clonazePAM (KLONOPIN) 0.5 MG tablet Take 1 tablet (0.5 mg total) by mouth 3 (three) times daily as needed for anxiety. 10/21/19   Norman Clay, MD  diclofenac sodium (VOLTAREN) 1 % GEL Apply 2 g topically 4 (four) times daily as needed (joint pain).    [provider]  Dulaglutide (TRULICITY) 1.5 YT/0.1SW SOPN Inject 1.5 mg into the skin every Saturday.     [provider]  DULoxetine (CYMBALTA) 60 MG capsule Take 1 capsule (60 mg total) by mouth at bedtime. 11/18/19   Marcial Pacas, MD  glipiZIDE (GLUCOTROL XL) 5 MG 24 hr  tablet Take 5 mg by mouth daily with breakfast.    [provider]  ibuprofen (IBU) 800 MG tablet Take 800 mg by mouth every 8 (eight) hours as needed for moderate pain.  07/06/17   [provider]  levocetirizine (XYZAL) 5 MG tablet Take 5 mg by mouth daily as needed for allergies.  09/11/18   [provider]  levothyroxine (SYNTHROID, LEVOTHROID) 150 MCG tablet Take 150 mcg by mouth daily before breakfast.    [provider]  lidocaine (LIDODERM) 5 % Place 1 patch onto the skin every 12 (twelve) hours. Remove & Discard patch within 12 hours or as directed by MD 01/07/20 01/06/21  Sable Feil, PA-C  meloxicam (MOBIC) 15 MG tablet Take 1 tablet (15 mg total) by mouth daily. 12/02/19   [place], [place], PA-C  metFORMIN (GLUCOPHAGE-XR) 500 MG 24 hr tablet Take 1,000 mg by mouth in the morning and at bedtime.    [provider]  methylphenidate (RITALIN) 10 MG tablet Take 1 tablet (10 mg total) by mouth in the morning and at bedtime. 12/20/19 01/19/20  Norman Clay, MD  methylphenidate (RITALIN) 10 MG tablet Take 1  tablet (10 mg total) by mouth 2 (two) times daily. 12/01/19 12/31/19  Norman Clay, MD  oxyCODONE-acetaminophen (PERCOCET) 7.5-325 MG tablet Take 1 tablet by mouth every 6 (six) hours as needed for up to 5 days for severe pain. 01/07/20 01/12/20  Sable Feil, PA-C  pantoprazole (PROTONIX) 40 MG tablet Take 40 mg by mouth daily as needed (acid reflux).  06/29/17   [provider]  pioglitazone (ACTOS) 15 MG tablet Take 15 mg by mouth daily. 07/20/17   [provider]  pregabalin (LYRICA) 200 MG capsule Take 1 capsule (200 mg total) by mouth 3 (three) times daily. 11/18/19   Marcial Pacas, MD  QUEtiapine (SEROQUEL) 200 MG tablet Take 1 tablet (200 mg total) by mouth at bedtime. 10/21/19   Norman Clay, MD  simvastatin (ZOCOR) 40 MG tablet Take 1 tablet (40 mg total) by mouth daily. Patient taking differently: Take 40 mg by mouth at  bedtime.  11/10/14   Niel Hummer, NP  SUMAtriptan (IMITREX) 100 MG tablet One tablet po prn migraine.  May repeat in 2 hrs if needed.  Do not exceed 200 mg per 24 hrs. Patient taking differently: Take 100 mg by mouth every 2 (two) hours as needed for migraine. One tablet po prn migraine.  May repeat in 2 hrs if needed.  Do not exceed 200 mg per 24 hrs. 06/26/12   Triplett, Lynelle Smoke, PA-C    Allergies Patient has no known allergies.  Family History  Problem Relation Age of Onset  . Cancer Other   . Diabetes Other   . Heart disease Other   . Arthritis Other   . Stroke Mother   . Anxiety disorder Mother   . Heart failure Mother   . Depression Mother   . Cancer - Lung Father   . Lung disease Father   . Colon cancer Father        father, diagnosed in his late 57s   . Heart failure Sister   . Depression Sister   . Alcohol abuse Brother   . Lung disease Other   . Heart disease Maternal Grandfather   . Stroke Paternal Grandmother   . Drug abuse Other   . Drug abuse Son   . Drug abuse Son     Social History Social History   Tobacco Use  . Smoking status: Former Smoker    Packs/day: 1.00    Years: 13.00    Pack years: 13.00    Types: Cigarettes    Quit date: 02/06/2019    Years since quitting: 0.9  . Smokeless tobacco: Never Used  Vaping Use  . Vaping Use: Never used  Substance Use Topics  . Alcohol use: No  . Drug use: No    Review of Systems Constitutional: No fever/chills Eyes: No visual changes. ENT: No sore throat. Cardiovascular: Denies chest pain. Respiratory: Denies shortness of breath. Gastrointestinal: No abdominal pain.  No nausea, no vomiting.  No diarrhea.  No constipation. Genitourinary: Negative for dysuria. Musculoskeletal: Negative for back pain. Skin: Negative for rash. Neurological: Negative for headaches, focal weakness or numbness. Psychiatric:  Anxiety, bipolar, depression, and PTSD. Endocrine:  Diabetes, hyperlipidemia, hypertension,  hypothyroidism.    ____________________________________________   PHYSICAL EXAM:  VITAL SIGNS: ED Triage Vitals  Enc Vitals Group     BP 01/07/20 1417 (!) 120/56     Pulse Rate 01/07/20 1417 80     Resp 01/07/20 1417 14     Temp 01/07/20 1417 97.9 F (36.6 C)  Temp Source 01/07/20 1417 Oral     SpO2 01/07/20 1417 97 %     Weight --      Height --      Head Circumference --      Peak Flow --      Pain Score 01/07/20 1412 7     Pain Loc --      Pain Edu? --      Excl. in [place]? --     Constitutional: Alert and oriented. Well appearing and in no acute distress. Neck: No cervical spine tenderness to palpation. Hematological/Lymphatic/Immunilogical: No cervical lymphadenopathy. Cardiovascular: Normal rate, regular rhythm. Grossly normal heart sounds.  Good peripheral circulation. Respiratory: Normal respiratory effort.  No retractions. Lungs CTAB. Gastrointestinal: Soft and nontender. No distention. No abdominal bruits. No CVA tenderness. Genitourinary: Deferred Musculoskeletal: No obvious deformity to the lumbar spine.  Patient is moderate guarding L1-L2 and L4-S1. Neurologic:  Normal speech and language. No gross focal neurologic deficits are appreciated. No gait instability. Skin:  Skin is warm, dry and intact. No rash noted.  Ecchymosis distal lumbar spine. Psychiatric: Mood and affect are normal. Speech and behavior are normal.  ____________________________________________   LABS (all labs ordered are listed, but only abnormal results are displayed)  Labs Reviewed - No data to display ____________________________________________  EKG   ____________________________________________  RADIOLOGY I, Sable Feil, personally viewed and evaluated these images (plain radiographs) as part of my medical decision making, as well as reviewing the written report by the radiologist.  ED MD interpretation: Number x-ray suggestive of compression fracture of L1 with diffuse  degenerative changes throughout the lumbar spine.  Official radiology report(s): DG Lumbar Spine 2-3 Views  Result Date: 01/07/2020 CLINICAL DATA:  Pain secondary to fall. EXAM: LUMBAR SPINE - 2-3 VIEW COMPARISON:  MRI 06/12/2017.  Lumbar spine series 12/09/2014. FINDINGS: Lumbar spine numbered the lowest segmented appearing lumbar shaped vertebrae on lateral view as L5. Mild-to-moderate L1 compression fracture noted. Diffuse multilevel degenerative change again noted. Mild thoracolumbar spine scoliosis. Surgical clips right upper quadrant. IMPRESSION: 1. Mild to moderate L1 compression fracture noted on today's exam. 2. Diffuse multilevel degenerative change again noted. Mild thoracolumbar spine scoliosis. Electronically Signed   By: Marcello Moores  Register   On: 01/07/2020 14:55    ____________________________________________   PROCEDURES  Procedure(s) performed (including Critical Care):  Procedures   ____________________________________________   INITIAL IMPRESSION / ASSESSMENT AND PLAN / ED COURSE  As part of my medical decision making, I reviewed the following data within the [facility]         Patient presents with low back pain secondary to a fall 4 days ago.  Discussed x-ray findings with patient.  Patient complaint physical exam consistent with acute back pain secondary to fall.  X-ray findings consistent with a compression fracture.  Patient given discharge care instructions and a prescription for Percocet as and Lidoderm.  Patient advised to follow-up with orthopedics by calling tomorrow morning to schedule appointment.      ____________________________________________   FINAL CLINICAL IMPRESSION(S) / ED DIAGNOSES  Final diagnoses:  Closed compression fracture of L1 lumbar vertebra, initial encounter [facility])     ED Discharge Orders         Ordered    oxyCODONE-acetaminophen (PERCOCET) 7.5-325 MG tablet  Every 6 hours PRN        01/07/20 1508     lidocaine (LIDODERM) 5 %  Every 12 hours        01/07/20 1508          *  Please note:  [name] was evaluated in Emergency Department on 01/07/2020 for the symptoms described in the history of present illness. She was evaluated in the context of the global COVID-19 pandemic, which necessitated consideration that the patient might be at risk for infection with the SARS-CoV-2 virus that causes COVID-19. Institutional protocols and algorithms that pertain to the evaluation of patients at risk for COVID-19 are in a state of rapid change based on information released by regulatory bodies including the CDC and federal and state organizations. These policies and algorithms were followed during the patient's care in the ED.  Some ED evaluations and interventions may be delayed as a result of limited staffing during and the pandemic.*   Note:  This document was prepared using Dragon voice recognition software and may include unintentional dictation errors.    Sable Feil, PA-C 01/07/20 1515    Duffy Bruce, MD 01/09/20 [phone]

## 2020-01-11 ENCOUNTER — Other Ambulatory Visit: Payer: Self-pay

## 2020-01-11 ENCOUNTER — Encounter (HOSPITAL_COMMUNITY): Payer: Self-pay | Admitting: Emergency Medicine

## 2020-01-11 ENCOUNTER — Emergency Department (HOSPITAL_COMMUNITY): Payer: Medicare HMO

## 2020-01-11 ENCOUNTER — Emergency Department (HOSPITAL_COMMUNITY)
Admission: EM | Admit: 2020-01-11 | Discharge: 2020-01-11 | Disposition: A | Discharge status: Home / Self Care | Payer: Medicare HMO | Attending: Emergency Medicine | Admitting: Emergency Medicine

## 2020-01-11 DIAGNOSIS — Z7984 Long term (current) use of oral hypoglycemic drugs: Secondary | ICD-10-CM | POA: Insufficient documentation

## 2020-01-11 DIAGNOSIS — Z79890 Hormone replacement therapy (postmenopausal): Secondary | ICD-10-CM | POA: Insufficient documentation

## 2020-01-11 DIAGNOSIS — S32010A Wedge compression fracture of first lumbar vertebra, initial encounter for closed fracture: Secondary | ICD-10-CM | POA: Diagnosis not present

## 2020-01-11 DIAGNOSIS — M5432 Sciatica, left side: Secondary | ICD-10-CM

## 2020-01-11 DIAGNOSIS — R222 Localized swelling, mass and lump, trunk: Secondary | ICD-10-CM | POA: Diagnosis not present

## 2020-01-11 DIAGNOSIS — Z79899 Other long term (current) drug therapy: Secondary | ICD-10-CM | POA: Diagnosis not present

## 2020-01-11 DIAGNOSIS — E039 Hypothyroidism, unspecified: Secondary | ICD-10-CM | POA: Diagnosis not present

## 2020-01-11 DIAGNOSIS — M47816 Spondylosis without myelopathy or radiculopathy, lumbar region: Secondary | ICD-10-CM | POA: Diagnosis not present

## 2020-01-11 DIAGNOSIS — E119 Type 2 diabetes mellitus without complications: Secondary | ICD-10-CM | POA: Diagnosis not present

## 2020-01-11 DIAGNOSIS — Z7982 Long term (current) use of aspirin: Secondary | ICD-10-CM | POA: Diagnosis not present

## 2020-01-11 DIAGNOSIS — S3992XA Unspecified injury of lower back, initial encounter: Secondary | ICD-10-CM | POA: Diagnosis present

## 2020-01-11 DIAGNOSIS — W19XXXA Unspecified fall, initial encounter: Secondary | ICD-10-CM | POA: Insufficient documentation

## 2020-01-11 DIAGNOSIS — Z96651 Presence of right artificial knee joint: Secondary | ICD-10-CM | POA: Diagnosis not present

## 2020-01-11 DIAGNOSIS — Z87891 Personal history of nicotine dependence: Secondary | ICD-10-CM | POA: Diagnosis not present

## 2020-01-11 DIAGNOSIS — M5442 Lumbago with sciatica, left side: Secondary | ICD-10-CM | POA: Diagnosis not present

## 2020-01-11 MED ORDER — HYDROCODONE-ACETAMINOPHEN 5-325 MG PO TABS: 1.0000 | ORAL_TABLET | ORAL | 0 refills | Status: DC | PRN

## 2020-01-11 MED ORDER — HYDROMORPHONE HCL 1 MG/ML IJ SOLN
1.0000 mg | Freq: Once | INTRAMUSCULAR | Status: AC
  Administered 2020-01-11: 1 mg via INTRAMUSCULAR
  Filled 2020-01-11: qty 1

## 2020-01-11 MED ORDER — METHOCARBAMOL 500 MG PO TABS: 500.0000 mg | ORAL_TABLET | Freq: Three times a day (TID) | ORAL | 0 refills | Status: DC | PRN

## 2020-01-11 MED ORDER — ONDANSETRON 8 MG PO TBDP
8.0000 mg | ORAL_TABLET | Freq: Once | ORAL | Status: AC
  Administered 2020-01-11: 8 mg via ORAL
  Filled 2020-01-11: qty 1

## 2020-01-11 MED ORDER — KETOROLAC TROMETHAMINE 30 MG/ML IJ SOLN
30.0000 mg | Freq: Once | INTRAMUSCULAR | Status: AC
  Administered 2020-01-11: 30 mg via INTRAMUSCULAR
  Filled 2020-01-11: qty 1

## 2020-01-11 NOTE — ED Notes (Signed)
Pt ambulatory at this time. Pt approached Nurse Station requesting pain medications. MD notified.

## 2020-01-11 NOTE — ED Provider Notes (Signed)
[facility] Provider Note   CSN: [id number] Arrival date & time: 01/11/20  0040     History Chief Complaint  Patient presents with  . Hip Pain    [name] is a 59 y.o. female.  Patient presents to the emergency department for evaluation of low back pain radiating down the left leg.  Patient reports that she fell a little over a week ago and has been having pain since.  She was seen at [facility] emergency department and diagnosed with an L1 compression fracture.  She has been taking pain medication but it has not been helping.  Patient reports that it feels like "nerve pain".  No change in bowel or bladder function.        Past Medical History:  Diagnosis Date  . Anxiety   . Bipolar disorder ([place])   . Complication of anesthesia   . Degenerative disc disease, lumbar   . Depression   . Diabetes mellitus   . Elevated cholesterol   . Fibromyalgia   . GERD (gastroesophageal reflux disease)   . Hypothyroidism   . Migraine   . Neuropathy   . Osteoarthritis   . PONV (postoperative nausea and vomiting)   . PTSD (post-traumatic stress disorder)    after death of son (accidental overdose)  . Thyroid disease     Patient Active Problem List   Diagnosis Date Noted  . Gait abnormality 11/18/2019  . Chronic low back pain with left-sided sciatica 11/18/2019  . Polypharmacy 11/18/2019  . MDD (major depressive disorder), recurrent, in partial remission ([place]) 04/25/2019  . Attention deficit hyperactivity disorder (ADHD), predominantly inattentive type 04/25/2019  . Paresthesia 01/28/2019  . Peripheral neuropathy 09/18/2018  . MDD (major depressive disorder), recurrent episode, mild ([place]) 07/18/2018  . Diarrhea 04/04/2018  . Status post total right knee replacement 08/28/2017  . 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 Palpitations 06/01/2016  . Hyperlipidemia 06/01/2016  .  Tobacco abuse 06/01/2016  . Dyspnea on exertion 05/16/2016  . Left leg swelling 05/16/2016  . Diabetes mellitus ([place]) 05/16/2016  . Chest pain   . Swelling   . Acute medial meniscus tear of left knee   . Post traumatic stress disorder (PTSD) 11/08/2014  . MDD (major depressive disorder), recurrent severe, without psychosis ([employer]) 11/08/2014  . Substance-induced psychotic disorder with hallucinations ([place]) 11/08/2014  . Panic disorder 11/08/2014  . Chronic pain syndrome 11/21/2013  . Primary osteoarthritis of both knees 11/21/2013  . Spinal stenosis of lumbar region 11/21/2013  . Difficulty in walking(719.7) 10/14/2013  . Sciatica associated with disorder of lumbar spine 09/17/2013  . Lower back pain 08/08/2013  . Lateral meniscus, posterior horn derangement 06/13/2013  . Arthritis of right knee 06/13/2013  . S/P medial meniscectomy of right knee 06/13/2013  . Left shoulder pain 08/28/2012  . Rotator cuff syndrome of left shoulder 08/28/2012  . Rotator cuff tear 08/28/2012  . Pes anserinus bursitis 05/16/2012  . Encounter for screening colonoscopy 04/16/2012  . Fatty liver 04/16/2012  . Back pain 07/26/2011  . Knee bursitis, left 07/26/2011  . OA (osteoarthritis) of knee 07/26/2011  . Spinal stenosis 10/28/2010  . Medial meniscus, posterior horn derangement 08/12/2010  . Knee pain 08/04/2010  . Sciatica 08/04/2010  . H N P-LUMBAR 01/14/2010  . 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 LUMBOSACRAL STRAIN 05/12/2009  . DERANGEMENT OF POSTERIOR HORN OF MEDIAL MENISCUS 01/19/2009  . BACK PAIN 01/19/2009  . LOWER LEG, ARTHRITIS,  DEGEN./OSTEO 12/02/2008  . JOINT EFFUSION, KNEE 11/10/2008  . ANSERINE BURSITIS, RIGHT 11/10/2008  . DERANGEMENT MENISCUS 10/15/2008  . KNEE PAIN 10/15/2008    Past Surgical History:  Procedure Laterality Date  . BILATERAL OOPHORECTOMY    . CARPAL TUNNEL RELEASE  bilateral  . CHOLECYSTECTOMY    . COLONOSCOPY WITH PROPOFOL N/A 05/01/2012   [facility]:NOBSJ  COLON polyps/Mild diverticulosis was noted in the sigmoid colon/Moderate sized internal hemorrhoids. path with tubular adenoma  . gallbladder    . KNEE ARTHROPLASTY Right 08/28/2017   Procedure: COMPUTER ASSISTED TOTAL KNEE ARTHROPLASTY;  Surgeon: Dereck Leep, MD;  Location: [facility] ORS;  Service: Orthopedics;  Laterality: Right;  . KNEE ARTHROSCOPY     bilateral  . KNEE ARTHROSCOPY Left 02/06/2019   Procedure: LEFT KNEE ARTHROSCOPY WITH  PARTIAL MEDIAL MENISECTOMY WITH MEDIAL CHONDRALPLASTY;  Surgeon: Dereck Leep, MD;  Location: [facility] ORS;  Service: Orthopedics;  Laterality: Left;  . KNEE ARTHROSCOPY WITH MEDIAL MENISECTOMY Right 10/29/2015   Procedure: ARTHROSCOPY RIGHT KNEE  WITH PARTIAL MEDIAL MENISECTOMY;  Surgeon: Carole Civil, MD;  Location: [facility]S;  Service: Orthopedics;  Laterality: Right;  . POLYPECTOMY N/A 05/01/2012   Procedure: POLYPECTOMY;  Surgeon: Danie Binder, MD;  Location: [facility] ORS;  Service: Endoscopy;  Laterality: N/A;  . SHOULDER SURGERY  left  . VAGINAL HYSTERECTOMY       OB History   No obstetric history on file.     Family History  Problem Relation Age of Onset  . Cancer Other   . Diabetes Other   . Heart disease Other   . Arthritis Other   . Stroke Mother   . Anxiety disorder Mother   . Heart failure Mother   . Depression Mother   . Cancer - Lung Father   . Lung disease Father   . Colon cancer Father        father, diagnosed in his late 107s   . Heart failure Sister   . Depression Sister   . Alcohol abuse Brother   . Lung disease Other   . Heart disease Maternal Grandfather   . Stroke Paternal Grandmother   . Drug abuse Other   . Drug abuse Son   . Drug abuse Son     Social History   Tobacco Use  . Smoking status: Former Smoker    Packs/day: 1.00    Years: 13.00    Pack years: 13.00    Types: Cigarettes    Quit date: 02/06/2019    Years since quitting: 0.9  . Smokeless tobacco: Never Used  Vaping Use  . Vaping Use: Never used    Substance Use Topics  . Alcohol use: No  . Drug use: No    Home Medications Prior to Admission medications   Medication Sig Start Date End Date Taking? Authorizing Provider  aspirin EC 81 MG tablet Take 81 mg by mouth daily.     [provider]  buPROPion (WELLBUTRIN SR) 100 MG 12 hr tablet Take 1 tablet (100 mg total) by mouth 2 (two) times daily. 10/21/19   Norman Clay, MD  canagliflozin (INVOKANA) 300 MG TABS tablet Take 300 mg by mouth daily. 11/10/14   Niel Hummer, NP  clonazePAM (KLONOPIN) 0.5 MG tablet Take 1 tablet (0.5 mg total) by mouth 3 (three) times daily as needed for anxiety. 10/21/19   Norman Clay, MD  diclofenac sodium (VOLTAREN) 1 % GEL Apply 2 g topically 4 (four) times daily as needed (joint pain).  [provider]  Dulaglutide (TRULICITY) 1.5 VO/5.3GU SOPN Inject 1.5 mg into the skin every Saturday.     [provider]  DULoxetine (CYMBALTA) 60 MG capsule Take 1 capsule (60 mg total) by mouth at bedtime. 11/18/19   Marcial Pacas, MD  glipiZIDE (GLUCOTROL XL) 5 MG 24 hr tablet Take 5 mg by mouth daily with breakfast.    [provider]  ibuprofen (IBU) 800 MG tablet Take 800 mg by mouth every 8 (eight) hours as needed for moderate pain.  07/06/17   [provider]  levocetirizine (XYZAL) 5 MG tablet Take 5 mg by mouth daily as needed for allergies.  09/11/18   [provider]  levothyroxine (SYNTHROID, LEVOTHROID) 150 MCG tablet Take 150 mcg by mouth daily before breakfast.    [provider]  lidocaine (LIDODERM) 5 % Place 1 patch onto the skin every 12 (twelve) hours. Remove & Discard patch within 12 hours or as directed by MD 01/07/20 01/06/21  Sable Feil, PA-C  meloxicam (MOBIC) 15 MG tablet Take 1 tablet (15 mg total) by mouth daily. 12/02/19   Wurst, [place], PA-C  metFORMIN (GLUCOPHAGE-XR) 500 MG 24 hr tablet Take 1,000 mg by mouth in the morning and at bedtime.    [provider]   methylphenidate (RITALIN) 10 MG tablet Take 1 tablet (10 mg total) by mouth in the morning and at bedtime. 12/20/19 01/19/20  Norman Clay, MD  methylphenidate (RITALIN) 10 MG tablet Take 1 tablet (10 mg total) by mouth 2 (two) times daily. 12/01/19 12/31/19  Norman Clay, MD  oxyCODONE-acetaminophen (PERCOCET) 7.5-325 MG tablet Take 1 tablet by mouth every 6 (six) hours as needed for up to 5 days for severe pain. 01/07/20 01/12/20  Sable Feil, PA-C  pantoprazole (PROTONIX) 40 MG tablet Take 40 mg by mouth daily as needed (acid reflux).  06/29/17   [provider]  pioglitazone (ACTOS) 15 MG tablet Take 15 mg by mouth daily. 07/20/17   [provider]  pregabalin (LYRICA) 200 MG capsule Take 1 capsule (200 mg total) by mouth 3 (three) times daily. 11/18/19   Marcial Pacas, MD  QUEtiapine (SEROQUEL) 200 MG tablet Take 1 tablet (200 mg total) by mouth at bedtime. 10/21/19   Norman Clay, MD  simvastatin (ZOCOR) 40 MG tablet Take 1 tablet (40 mg total) by mouth daily. Patient taking differently: Take 40 mg by mouth at bedtime.  11/10/14   Niel Hummer, NP  SUMAtriptan (IMITREX) 100 MG tablet One tablet po prn migraine.  May repeat in 2 hrs if needed.  Do not exceed 200 mg per 24 hrs. Patient taking differently: Take 100 mg by mouth every 2 (two) hours as needed for migraine. One tablet po prn migraine.  May repeat in 2 hrs if needed.  Do not exceed 200 mg per 24 hrs. 06/26/12   Triplett, Lynelle Smoke, PA-C    Allergies    Patient has no known allergies.  Review of Systems   Review of Systems  Musculoskeletal: Positive for back pain.  All other systems reviewed and are negative.   Physical Exam Updated Vital Signs BP 102/70 (BP Location: Right Arm)   Pulse 86   Temp 97.9 F (36.6 C) (Temporal)   Resp 16   Ht 5\' 6"  (1.676 m)   Wt 98 kg   SpO2 97%   BMI 34.87 kg/m   Physical Exam Vitals and nursing note reviewed.  Constitutional:      General: She is not  in acute distress.     Appearance: Normal appearance. She is well-developed.  HENT:     Head: Normocephalic and atraumatic.     Right Ear: Hearing normal.     Left Ear: Hearing normal.     Nose: Nose normal.  Eyes:     Conjunctiva/sclera: Conjunctivae normal.     Pupils: Pupils are equal, round, and reactive to light.  Cardiovascular:     Rate and Rhythm: Regular rhythm.     Heart sounds: S1 normal and S2 normal. No murmur heard.  No friction rub. No gallop.   Pulmonary:     Effort: Pulmonary effort is normal. No respiratory distress.     Breath sounds: Normal breath sounds.  Chest:     Chest wall: No tenderness.  Abdominal:     General: Bowel sounds are normal.     Palpations: Abdomen is soft.     Tenderness: There is no abdominal tenderness. There is no guarding or rebound. Negative signs include Murphy's sign and McBurney's sign.     Hernia: No hernia is present.  Musculoskeletal:     Cervical back: Normal, normal range of motion and neck supple.     Thoracic back: Normal.     Lumbar back: Tenderness present. Positive left straight leg raise test (45 degrees).       Back:  Skin:    General: Skin is warm and dry.     Findings: No rash.  Neurological:     Mental Status: She is alert and oriented to person, place, and time.     GCS: GCS eye subscore is 4. GCS verbal subscore is 5. GCS motor subscore is 6.     Cranial Nerves: No cranial nerve deficit.     Sensory: No sensory deficit.     Coordination: Coordination normal.     Deep Tendon Reflexes:     Reflex Scores:      Patellar reflexes are 2+ on the right side and 2+ on the left side. Psychiatric:        Speech: Speech normal.        Behavior: Behavior normal.        Thought Content: Thought content normal.     ED Results / Procedures / Treatments   Labs (all labs ordered are listed, but only abnormal results are displayed) Labs Reviewed - No data to display  EKG None  Radiology CT Lumbar Spine Wo Contrast  Result Date:  01/11/2020 CLINICAL DATA:  Continued pain, recent L1 compression fracture EXAM: CT LUMBAR SPINE WITHOUT CONTRAST TECHNIQUE: Multidetector CT imaging of the lumbar spine was performed without intravenous contrast administration. Multiplanar CT image reconstructions were also generated. COMPARISON:  None. FINDINGS: Segmentation: There are 5 non-rib bearing lumbar type vertebral bodies with the last intervertebral disc space labeled as L5-S1. Alignment: Normal Vertebrae: There is a subacute compression deformity of the inferior endplate of L1 with less than 25% loss in vertebral body height. There is buckling of the anteroinferior cortex. No retropulsion of fragments is seen. No other fracture is identified. Within the right iliac wing again noted is a osseous lesion with central calcification. No cortical breakthrough is noted. Paraspinal and other soft tissues: Mild prevertebral soft tissue swelling is seen at L1. There is scattered vascular calcifications seen. The sacroiliac joints are intact. Disc levels: Mild disc height loss seen at L1-L2 and L2-L3, however no significant canal or neural foraminal narrowing is noted. IMPRESSION: Subacute L1 inferior endplate compression deformity with less than 25%  loss in height. No retropulsion of fragments. Mild lumbar spine spondylosis. Electronically Signed   By: Prudencio Pair M.D.   On: 01/11/2020 03:41    Procedures Procedures (including critical care time)  Medications Ordered in ED Medications  HYDROmorphone (DILAUDID) injection 1 mg (1 mg Intramuscular Given 01/11/20 0250)  ondansetron (ZOFRAN-ODT) disintegrating tablet 8 mg (8 mg Oral Given 01/11/20 0250)  ketorolac (TORADOL) 30 MG/ML injection 30 mg (30 mg Intramuscular Given 01/11/20 0250)    ED Course  I have reviewed the triage vital signs and the nursing notes.  Pertinent labs & imaging results that were available during my care of the patient were reviewed by me and considered in my medical  decision making (see chart for details).    MDM Rules/Calculators/A&P                          Patient presents to the emergency department for evaluation of back pain.  Patient reports a fall a week and a half ago suffering an L1 compression fracture.  This was documented on plain film at [facility] emergency department.  Patient reports continued low back pain with radiation down the leg.  She does have mild straight leg raise on the left but normal strength, normal sensation, normal reflexes.  No saddle anesthesia.  No foot drop.  CT scan confirms subacute fracture with less than 25% height loss.  No retropulsion of fragments.  Does not require any neurosurgical emergent evaluation.  Final Clinical Impression(s) / ED Diagnoses Final diagnoses:  Closed compression fracture of body of L1 vertebra ([place])  Sciatica of left side    Rx / DC Orders ED Discharge Orders    None       Orpah Greek, MD 01/11/20 [phone]

## 2020-01-11 NOTE — ED Triage Notes (Signed)
Pt states she fell a few weeks ago and was told she has a fracture in her back. Pt states she is having continued pain in her back that is going down her left leg.

## 2020-01-13 ENCOUNTER — Telehealth (INDEPENDENT_AMBULATORY_CARE_PROVIDER_SITE_OTHER): Payer: Medicare HMO | Admitting: Psychiatry

## 2020-01-13 ENCOUNTER — Encounter (HOSPITAL_COMMUNITY): Payer: Self-pay | Admitting: Psychiatry

## 2020-01-13 ENCOUNTER — Other Ambulatory Visit: Payer: Self-pay

## 2020-01-13 DIAGNOSIS — F3342 Major depressive disorder, recurrent, in full remission: Secondary | ICD-10-CM | POA: Diagnosis not present

## 2020-01-13 DIAGNOSIS — F900 Attention-deficit hyperactivity disorder, predominantly inattentive type: Secondary | ICD-10-CM

## 2020-01-13 DIAGNOSIS — F4310 Post-traumatic stress disorder, unspecified: Secondary | ICD-10-CM

## 2020-01-13 DIAGNOSIS — F3341 Major depressive disorder, recurrent, in partial remission: Secondary | ICD-10-CM | POA: Diagnosis not present

## 2020-01-13 DIAGNOSIS — W010XXA Fall on same level from slipping, tripping and stumbling without subsequent striking against object, initial encounter: Secondary | ICD-10-CM | POA: Diagnosis not present

## 2020-01-13 DIAGNOSIS — M533 Sacrococcygeal disorders, not elsewhere classified: Secondary | ICD-10-CM | POA: Diagnosis not present

## 2020-01-13 DIAGNOSIS — S32010A Wedge compression fracture of first lumbar vertebra, initial encounter for closed fracture: Secondary | ICD-10-CM | POA: Diagnosis not present

## 2020-01-13 MED ORDER — BUPROPION HCL ER (SR) 100 MG PO TB12: 100.0000 mg | ORAL_TABLET | Freq: Two times a day (BID) | ORAL | 0 refills | Status: DC

## 2020-01-13 MED ORDER — CLONAZEPAM 0.5 MG PO TABS: 0.5000 mg | ORAL_TABLET | Freq: Three times a day (TID) | ORAL | 1 refills | Status: DC | PRN

## 2020-01-13 MED ORDER — ESZOPICLONE 1 MG PO TABS: 1.0000 mg | ORAL_TABLET | Freq: Every evening | ORAL | 1 refills | Status: DC | PRN

## 2020-01-13 MED ORDER — QUETIAPINE FUMARATE 200 MG PO TABS: 200.0000 mg | ORAL_TABLET | Freq: Every day | ORAL | 0 refills | Status: DC

## 2020-01-13 MED ORDER — METHYLPHENIDATE HCL 10 MG PO TABS: 10.0000 mg | ORAL_TABLET | Freq: Two times a day (BID) | ORAL | 0 refills | Status: DC

## 2020-01-13 MED FILL — Hydrocodone-Acetaminophen Tab 5-325 MG: ORAL | Qty: 6 | Status: AC

## 2020-01-13 NOTE — Patient Instructions (Addendum)
1. Continue bupropion 100 mg BID (she prefers SR) 2. ContinueQuetiapine200mg  at night 3.ContinueRitalin 10 mg twice a day  4. Start Lunesta 1 mg at night as needed for sleep  5. Continue clonazepam 0.5 mgthree timesa day for anxiety 6.Next appointment: 12/13 at 8:50

## 2020-01-14 ENCOUNTER — Other Ambulatory Visit (HOSPITAL_COMMUNITY): Payer: Self-pay | Admitting: Orthopedic Surgery

## 2020-01-14 ENCOUNTER — Other Ambulatory Visit: Payer: Self-pay | Admitting: Orthopedic Surgery

## 2020-01-14 DIAGNOSIS — S32010A Wedge compression fracture of first lumbar vertebra, initial encounter for closed fracture: Secondary | ICD-10-CM

## 2020-01-14 DIAGNOSIS — W19XXXA Unspecified fall, initial encounter: Secondary | ICD-10-CM

## 2020-01-14 DIAGNOSIS — M533 Sacrococcygeal disorders, not elsewhere classified: Secondary | ICD-10-CM

## 2020-01-24 ENCOUNTER — Other Ambulatory Visit: Payer: Self-pay

## 2020-01-24 ENCOUNTER — Ambulatory Visit
Admission: RE | Admit: 2020-01-24 | Discharge: 2020-01-24 | Disposition: A | Discharge status: Home / Self Care | Payer: Medicare HMO | Source: Ambulatory Visit | Attending: Orthopedic Surgery | Admitting: Orthopedic Surgery

## 2020-01-24 DIAGNOSIS — M47816 Spondylosis without myelopathy or radiculopathy, lumbar region: Secondary | ICD-10-CM | POA: Diagnosis not present

## 2020-01-24 DIAGNOSIS — M545 Low back pain, unspecified: Secondary | ICD-10-CM | POA: Diagnosis not present

## 2020-01-24 DIAGNOSIS — M6258 Muscle wasting and atrophy, not elsewhere classified, other site: Secondary | ICD-10-CM | POA: Diagnosis not present

## 2020-01-24 DIAGNOSIS — W19XXXA Unspecified fall, initial encounter: Secondary | ICD-10-CM

## 2020-01-24 DIAGNOSIS — S32010A Wedge compression fracture of first lumbar vertebra, initial encounter for closed fracture: Secondary | ICD-10-CM

## 2020-01-24 DIAGNOSIS — Z90710 Acquired absence of both cervix and uterus: Secondary | ICD-10-CM | POA: Diagnosis not present

## 2020-01-24 DIAGNOSIS — M533 Sacrococcygeal disorders, not elsewhere classified: Secondary | ICD-10-CM | POA: Diagnosis not present

## 2020-01-24 DIAGNOSIS — M48061 Spinal stenosis, lumbar region without neurogenic claudication: Secondary | ICD-10-CM | POA: Diagnosis not present

## 2020-01-24 DIAGNOSIS — S3219XA Other fracture of sacrum, initial encounter for closed fracture: Secondary | ICD-10-CM | POA: Diagnosis not present

## 2020-01-29 DIAGNOSIS — M797 Fibromyalgia: Secondary | ICD-10-CM | POA: Diagnosis not present

## 2020-01-29 DIAGNOSIS — Z79899 Other long term (current) drug therapy: Secondary | ICD-10-CM | POA: Diagnosis not present

## 2020-01-29 DIAGNOSIS — M5416 Radiculopathy, lumbar region: Secondary | ICD-10-CM | POA: Diagnosis not present

## 2020-01-29 DIAGNOSIS — M546 Pain in thoracic spine: Secondary | ICD-10-CM | POA: Diagnosis not present

## 2020-01-29 DIAGNOSIS — F17210 Nicotine dependence, cigarettes, uncomplicated: Secondary | ICD-10-CM | POA: Diagnosis not present

## 2020-01-29 DIAGNOSIS — M542 Cervicalgia: Secondary | ICD-10-CM | POA: Diagnosis not present

## 2020-01-31 DIAGNOSIS — S32010A Wedge compression fracture of first lumbar vertebra, initial encounter for closed fracture: Secondary | ICD-10-CM | POA: Diagnosis not present

## 2020-01-31 DIAGNOSIS — S32020A Wedge compression fracture of second lumbar vertebra, initial encounter for closed fracture: Secondary | ICD-10-CM | POA: Diagnosis not present

## 2020-02-03 ENCOUNTER — Telehealth: Payer: Self-pay

## 2020-02-03 ENCOUNTER — Other Ambulatory Visit: Payer: Self-pay | Admitting: Orthopedic Surgery

## 2020-02-03 NOTE — Telephone Encounter (Signed)
pt called left message that the sleeping medication is not working can she increase or change to something else

## 2020-02-04 ENCOUNTER — Other Ambulatory Visit: Payer: Self-pay | Admitting: Psychiatry

## 2020-02-04 ENCOUNTER — Encounter
Admission: RE | Admit: 2020-02-04 | Discharge: 2020-02-04 | Disposition: A | Discharge status: Home / Self Care | Payer: Medicare HMO | Source: Ambulatory Visit | Attending: Orthopedic Surgery | Admitting: Orthopedic Surgery

## 2020-02-04 MED ORDER — ESZOPICLONE 2 MG PO TABS: 2.0000 mg | ORAL_TABLET | Freq: Every evening | ORAL | 0 refills | Status: DC | PRN

## 2020-02-04 NOTE — Telephone Encounter (Signed)
-   Ordered Lunesta 2 mg at night as needed for insomnia.  - Please contact the pharmacy to cancel Lunesta 1 mg refill order

## 2020-02-04 NOTE — Patient Instructions (Addendum)
Your procedure is scheduled on:02/18/20 Tuesday Report to the Registration Desk on the 1st floor of the [facility]. To find out your arrival time, please call [phone] between 1PM - 3PM on: 02/17/20 Monday Covid Test - 02/17/20 come between 8am and 1pm.  REMEMBER: Instructions that are not followed completely may result in serious medical risk, up to and including death; or upon the discretion of your surgeon and anesthesiologist your surgery may need to be rescheduled.  Do not eat food after midnight the night before surgery.  No gum chewing, lozengers or hard candies.  You may however, drink water up to 2 hours before you are scheduled to arrive for your surgery. Do not drink anything within 2 hours of your scheduled arrival time.  In addition, your doctor has ordered for you to drink the provided  Gatorade G2 Drinking this carbohydrate drink up to two hours before surgery helps to reduce insulin resistance and improve patient outcomes. Please complete drinking 2 hours prior to scheduled arrival time.  TAKE THESE MEDICATIONS THE MORNING OF SURGERY WITH A SIP OF WATER:    - buPROPion (WELLBUTRIN SR) 100 MG 12 hr tablet   - cycloSPORINE (RESTASIS) 0.05 % ophthalmic emulsion   - levothyroxine (SYNTHROID, LEVOTHROID) 150 MCG tablet   - methylphenidate (RITALIN) 10 MG tablet    - pantoprazole (PROTONIX) 40 MG tablet, (take one the night before and one on the morning of surgery - helps to prevent nausea after surgery.)   -  pregabalin (LYRICA) 200 MG capsule   Stop Metformin on 11/27 , may resume taking after surgery.     Follow recommendations from Cardiologist, Pulmonologist or PCP regarding stopping Aspirin, Coumadin, Plavix, Eliquis, Pradaxa, or Pletal. Contact Dr. Rudene Christians to see if you need to stop Aspirin before surgery. Do not take the morning of surgery.  One week prior to surgery: Stop Anti-inflammatories (NSAIDS) such as Advil, Aleve, Ibuprofen, Motrin, Naproxen, Naprosyn  and Aspirin based products such as Excedrin, Goodys Powder, BC Powder.  Stop ANY OVER THE COUNTER supplements until after surgery.canagliflozin (INVOKANA) 300 MG TABS tablet 02/10/20.  (However, you may continue taking Vitamin D, Vitamin B, and multivitamin up until the day before surgery.)  No Alcohol for 24 hours before or after surgery.  No Smoking including e-cigarettes for 24 hours prior to surgery.  No chewable tobacco products for at least 6 hours prior to surgery.  No nicotine patches on the day of surgery.  Do not use any "recreational" drugs for at least a week prior to your surgery.  Please be advised that the combination of cocaine and anesthesia may have negative outcomes, up to and including death. If you test positive for cocaine, your surgery will be cancelled.  On the morning of surgery brush your teeth with toothpaste and water, you may rinse your mouth with mouthwash if you wish. Do not swallow any toothpaste or mouthwash.  Do not wear jewelry, make-up, hairpins, clips or nail polish.  Do not wear lotions, powders, or perfumes.   Do not shave body from the neck down 48 hours prior to surgery just in case you cut yourself which could leave a site for infection.  Also, freshly shaved skin may become irritated if using the CHG soap.  Contact lenses, hearing aids and dentures may not be worn into surgery.  Do not bring valuables to the hospital. [facility] is not responsible for any missing/lost belongings or valuables.   Use CHG Soap or wipes as directed on  instruction sheet.  Notify your doctor if there is any change in your medical condition (cold, fever, infection).  Wear comfortable clothing (specific to your surgery type) to the hospital.  Plan for stool softeners for home use; pain medications have a tendency to cause constipation. You can also help prevent constipation by eating foods high in fiber such as fruits and vegetables and drinking plenty of fluids  as your diet allows.  After surgery, you can help prevent lung complications by doing breathing exercises.  Take deep breaths and cough every 1-2 hours. Your doctor may order a device called an Incentive Spirometer to help you take deep breaths. When coughing or sneezing, hold a pillow firmly against your incision with both hands. This is called "splinting." Doing this helps protect your incision. It also decreases belly discomfort.  If you are being admitted to the hospital overnight, leave your suitcase in the car. After surgery it may be brought to your room.  If you are being discharged the day of surgery, you will not be allowed to drive home. You will need a responsible adult (18 years or older) to drive you home and stay with you that night.   If you are taking public transportation, you will need to have a responsible adult (18 years or older) with you. Please confirm with your physician that it is acceptable to use public transportation.   Please call the [facility] Dept. at [phone] if you have any questions about these instructions.  Visitation Policy:  Patients undergoing a surgery or procedure may have one family member or support person with them as long as that person is not COVID-19 positive or experiencing its symptoms.  That person may remain in the waiting area during the procedure.  Inpatient Visitation Update:   In an effort to ensure the safety of our team members and our patients, we are implementing a change to our visitation policy:  Effective Monday, Aug. 9, at 7 a.m., inpatients will be allowed one support person.  o The support person may change daily.  o The support person must pass our screening, gel in and out, and wear a mask at all times, including in the patient's room.  o Patients must also wear a mask when staff or their support person are in the room.  o Masking is required regardless of vaccination status.  Systemwide, no  visitors 17 or younger.

## 2020-02-05 ENCOUNTER — Other Ambulatory Visit: Payer: Medicare HMO

## 2020-02-10 ENCOUNTER — Other Ambulatory Visit
Admission: RE | Admit: 2020-02-10 | Discharge: 2020-02-10 | Disposition: A | Discharge status: Home / Self Care | Payer: Medicare HMO | Source: Ambulatory Visit | Attending: Orthopedic Surgery | Admitting: Orthopedic Surgery

## 2020-02-10 ENCOUNTER — Other Ambulatory Visit: Payer: Self-pay

## 2020-02-10 DIAGNOSIS — E119 Type 2 diabetes mellitus without complications: Secondary | ICD-10-CM | POA: Diagnosis not present

## 2020-02-10 DIAGNOSIS — Z01818 Encounter for other preprocedural examination: Secondary | ICD-10-CM | POA: Diagnosis not present

## 2020-02-10 DIAGNOSIS — E118 Type 2 diabetes mellitus with unspecified complications: Secondary | ICD-10-CM | POA: Insufficient documentation

## 2020-02-10 LAB — CBC
HCT: 47.3 % — ABNORMAL HIGH (ref 36.0–46.0)
Hemoglobin: 16.3 g/dL — ABNORMAL HIGH (ref 12.0–15.0)
MCH: 30.6 pg (ref 26.0–34.0)
MCHC: 34.5 g/dL (ref 30.0–36.0)
MCV: 88.7 fL (ref 80.0–100.0)
Platelets: 307 10*3/uL (ref 150–400)
RBC: 5.33 MIL/uL — ABNORMAL HIGH (ref 3.87–5.11)
RDW: 13.7 % (ref 11.5–15.5)
WBC: 11.4 10*3/uL — ABNORMAL HIGH (ref 4.0–10.5)
nRBC: 0 % (ref 0.0–0.2)

## 2020-02-10 LAB — BASIC METABOLIC PANEL
Anion gap: 12 (ref 5–15)
BUN: 22 mg/dL (ref 8–23)
CO2: 26 mmol/L (ref 22–32)
Calcium: 9.6 mg/dL (ref 8.9–10.3)
Chloride: 97 mmol/L — ABNORMAL LOW (ref 98–111)
Creatinine, Ser: 0.77 mg/dL (ref 0.44–1.00)
GFR, Estimated: >60 mL/min (ref >60)
Glucose, Bld: 172 mg/dL — ABNORMAL HIGH (ref 70–99)
Potassium: 4.2 mmol/L (ref 3.5–5.1)
Sodium: 135 mmol/L (ref 135–145)

## 2020-02-10 NOTE — Telephone Encounter (Signed)
Already done

## 2020-02-12 DIAGNOSIS — M797 Fibromyalgia: Secondary | ICD-10-CM | POA: Diagnosis not present

## 2020-02-12 DIAGNOSIS — M542 Cervicalgia: Secondary | ICD-10-CM | POA: Diagnosis not present

## 2020-02-12 DIAGNOSIS — S32019S Unspecified fracture of first lumbar vertebra, sequela: Secondary | ICD-10-CM | POA: Diagnosis not present

## 2020-02-12 DIAGNOSIS — F17210 Nicotine dependence, cigarettes, uncomplicated: Secondary | ICD-10-CM | POA: Diagnosis not present

## 2020-02-12 DIAGNOSIS — M546 Pain in thoracic spine: Secondary | ICD-10-CM | POA: Diagnosis not present

## 2020-02-12 DIAGNOSIS — Z79899 Other long term (current) drug therapy: Secondary | ICD-10-CM | POA: Diagnosis not present

## 2020-02-17 ENCOUNTER — Other Ambulatory Visit
Admission: RE | Admit: 2020-02-17 | Discharge: 2020-02-17 | Disposition: A | Discharge status: Home / Self Care | Payer: Medicare HMO | Source: Ambulatory Visit | Attending: Orthopedic Surgery | Admitting: Orthopedic Surgery

## 2020-02-17 ENCOUNTER — Other Ambulatory Visit: Payer: Self-pay

## 2020-02-17 DIAGNOSIS — Z01812 Encounter for preprocedural laboratory examination: Secondary | ICD-10-CM | POA: Insufficient documentation

## 2020-02-17 DIAGNOSIS — Z20822 Contact with and (suspected) exposure to covid-19: Secondary | ICD-10-CM | POA: Insufficient documentation

## 2020-02-17 LAB — SARS CORONAVIRUS 2 (TAT 6-24 HRS): SARS Coronavirus 2: NEGATIVE

## 2020-02-17 MED ORDER — ORAL CARE MOUTH RINSE: 15.0000 mL | Freq: Once | OROMUCOSAL | Status: AC

## 2020-02-17 MED ORDER — CEFAZOLIN SODIUM-DEXTROSE 2-4 GM/100ML-% IV SOLN
2.0000 g | INTRAVENOUS | Status: AC
  Administered 2020-02-18: 2 g via INTRAVENOUS

## 2020-02-17 MED ORDER — SODIUM CHLORIDE 0.9 % IV SOLN: INTRAVENOUS | Status: DC

## 2020-02-17 MED ORDER — CHLORHEXIDINE GLUCONATE 0.12 % MT SOLN: 15.0000 mL | Freq: Once | OROMUCOSAL | Status: AC

## 2020-02-18 ENCOUNTER — Ambulatory Visit: Payer: Medicare HMO | Admitting: Anesthesiology

## 2020-02-18 ENCOUNTER — Encounter
Admission: RE | Disposition: A | Discharge status: Home / Self Care | Payer: Self-pay | Source: Home / Self Care | Attending: Orthopedic Surgery

## 2020-02-18 ENCOUNTER — Ambulatory Visit: Payer: Medicare HMO

## 2020-02-18 ENCOUNTER — Ambulatory Visit
Admission: RE | Admit: 2020-02-18 | Discharge: 2020-02-18 | Disposition: A | Discharge status: Home / Self Care | Payer: Medicare HMO | Attending: Orthopedic Surgery | Admitting: Orthopedic Surgery

## 2020-02-18 ENCOUNTER — Encounter: Payer: Self-pay | Admitting: Orthopedic Surgery

## 2020-02-18 ENCOUNTER — Ambulatory Visit: Payer: Medicare HMO | Admitting: Urgent Care

## 2020-02-18 DIAGNOSIS — Z79899 Other long term (current) drug therapy: Secondary | ICD-10-CM | POA: Insufficient documentation

## 2020-02-18 DIAGNOSIS — Z7982 Long term (current) use of aspirin: Secondary | ICD-10-CM | POA: Diagnosis not present

## 2020-02-18 DIAGNOSIS — E785 Hyperlipidemia, unspecified: Secondary | ICD-10-CM | POA: Diagnosis not present

## 2020-02-18 DIAGNOSIS — E119 Type 2 diabetes mellitus without complications: Secondary | ICD-10-CM | POA: Insufficient documentation

## 2020-02-18 DIAGNOSIS — Z7984 Long term (current) use of oral hypoglycemic drugs: Secondary | ICD-10-CM | POA: Diagnosis not present

## 2020-02-18 DIAGNOSIS — E039 Hypothyroidism, unspecified: Secondary | ICD-10-CM | POA: Diagnosis not present

## 2020-02-18 DIAGNOSIS — S32020A Wedge compression fracture of second lumbar vertebra, initial encounter for closed fracture: Secondary | ICD-10-CM | POA: Diagnosis not present

## 2020-02-18 DIAGNOSIS — Z79890 Hormone replacement therapy (postmenopausal): Secondary | ICD-10-CM | POA: Diagnosis not present

## 2020-02-18 DIAGNOSIS — S32010A Wedge compression fracture of first lumbar vertebra, initial encounter for closed fracture: Secondary | ICD-10-CM | POA: Diagnosis not present

## 2020-02-18 DIAGNOSIS — Z981 Arthrodesis status: Secondary | ICD-10-CM | POA: Diagnosis not present

## 2020-02-18 DIAGNOSIS — E7800 Pure hypercholesterolemia, unspecified: Secondary | ICD-10-CM | POA: Diagnosis not present

## 2020-02-18 DIAGNOSIS — Z419 Encounter for procedure for purposes other than remedying health state, unspecified: Secondary | ICD-10-CM

## 2020-02-18 DIAGNOSIS — E1140 Type 2 diabetes mellitus with diabetic neuropathy, unspecified: Secondary | ICD-10-CM | POA: Diagnosis not present

## 2020-02-18 DIAGNOSIS — W01198A Fall on same level from slipping, tripping and stumbling with subsequent striking against other object, initial encounter: Secondary | ICD-10-CM | POA: Diagnosis not present

## 2020-02-18 HISTORY — PX: KYPHOPLASTY: SHX5884

## 2020-02-18 LAB — GLUCOSE, CAPILLARY
Glucose-Capillary: 187 mg/dL — ABNORMAL HIGH (ref 70–99)
Glucose-Capillary: 185 mg/dL — ABNORMAL HIGH (ref 70–99)

## 2020-02-18 SURGERY — KYPHOPLASTY
Anesthesia: General

## 2020-02-18 MED ORDER — ONDANSETRON HCL 4 MG/2ML IJ SOLN: 4.0000 mg | Freq: Once | INTRAMUSCULAR | Status: DC | PRN

## 2020-02-18 MED ORDER — BUPIVACAINE-EPINEPHRINE (PF) 0.5% -1:200000 IJ SOLN
INTRAMUSCULAR | Status: DC | PRN
  Administered 2020-02-18: 30 mL

## 2020-02-18 MED ORDER — ONDANSETRON HCL 4 MG PO TABS: 4.0000 mg | ORAL_TABLET | Freq: Four times a day (QID) | ORAL | Status: DC | PRN

## 2020-02-18 MED ORDER — CHLORHEXIDINE GLUCONATE 0.12 % MT SOLN
OROMUCOSAL | Status: AC
  Administered 2020-02-18: 15 mL via OROMUCOSAL
  Filled 2020-02-18: qty 15

## 2020-02-18 MED ORDER — FENTANYL CITRATE (PF) 100 MCG/2ML IJ SOLN: 50.0000 ug | Freq: Once | INTRAMUSCULAR | Status: DC

## 2020-02-18 MED ORDER — LIDOCAINE HCL (PF) 1 % IJ SOLN
INTRAMUSCULAR | Status: AC
  Filled 2020-02-18: qty 30

## 2020-02-18 MED ORDER — METOCLOPRAMIDE HCL 5 MG/ML IJ SOLN: 5.0000 mg | Freq: Three times a day (TID) | INTRAMUSCULAR | Status: DC | PRN

## 2020-02-18 MED ORDER — FENTANYL CITRATE (PF) 100 MCG/2ML IJ SOLN
INTRAMUSCULAR | Status: AC
  Filled 2020-02-18: qty 2

## 2020-02-18 MED ORDER — LIDOCAINE HCL 1 % IJ SOLN
INTRAMUSCULAR | Status: DC | PRN
  Administered 2020-02-18: 40 mL

## 2020-02-18 MED ORDER — MIDAZOLAM HCL 2 MG/2ML IJ SOLN
INTRAMUSCULAR | Status: AC
  Filled 2020-02-18: qty 2

## 2020-02-18 MED ORDER — METOCLOPRAMIDE HCL 10 MG PO TABS: 5.0000 mg | ORAL_TABLET | Freq: Three times a day (TID) | ORAL | Status: DC | PRN

## 2020-02-18 MED ORDER — SODIUM CHLORIDE 0.9 % IV SOLN: INTRAVENOUS | Status: DC

## 2020-02-18 MED ORDER — CEFAZOLIN SODIUM-DEXTROSE 2-4 GM/100ML-% IV SOLN
INTRAVENOUS | Status: AC
  Filled 2020-02-18: qty 100

## 2020-02-18 MED ORDER — BUPIVACAINE-EPINEPHRINE (PF) 0.5% -1:200000 IJ SOLN
INTRAMUSCULAR | Status: AC
  Filled 2020-02-18: qty 30

## 2020-02-18 MED ORDER — LIDOCAINE HCL (PF) 1 % IJ SOLN
INTRAMUSCULAR | Status: AC
  Filled 2020-02-18: qty 60

## 2020-02-18 MED ORDER — PROPOFOL 500 MG/50ML IV EMUL
INTRAVENOUS | Status: DC | PRN
  Administered 2020-02-18: 125 ug/kg/min via INTRAVENOUS

## 2020-02-18 MED ORDER — FENTANYL CITRATE (PF) 100 MCG/2ML IJ SOLN
INTRAMUSCULAR | Status: DC | PRN
  Administered 2020-02-18: 50 ug via INTRAVENOUS

## 2020-02-18 MED ORDER — OXYCODONE HCL 5 MG PO TABS
ORAL_TABLET | ORAL | Status: AC
  Filled 2020-02-18: qty 1

## 2020-02-18 MED ORDER — PROPOFOL 10 MG/ML IV BOLUS
INTRAVENOUS | Status: DC | PRN
  Administered 2020-02-18: 40 mg via INTRAVENOUS

## 2020-02-18 MED ORDER — ONDANSETRON HCL 4 MG/2ML IJ SOLN: 4.0000 mg | Freq: Four times a day (QID) | INTRAMUSCULAR | Status: DC | PRN

## 2020-02-18 MED ORDER — MIDAZOLAM HCL 2 MG/2ML IJ SOLN
INTRAMUSCULAR | Status: DC | PRN
  Administered 2020-02-18: 2 mg via INTRAVENOUS

## 2020-02-18 MED ORDER — OXYCODONE HCL 5 MG/5ML PO SOLN: 5.0000 mg | Freq: Once | ORAL | Status: AC | PRN

## 2020-02-18 MED ORDER — OXYCODONE HCL 5 MG PO TABS
5.0000 mg | ORAL_TABLET | Freq: Once | ORAL | Status: AC | PRN
  Administered 2020-02-18: 5 mg via ORAL

## 2020-02-18 MED ORDER — PROPOFOL 500 MG/50ML IV EMUL
INTRAVENOUS | Status: AC
  Filled 2020-02-18: qty 50

## 2020-02-18 MED ORDER — FENTANYL CITRATE (PF) 100 MCG/2ML IJ SOLN
25.0000 ug | INTRAMUSCULAR | Status: DC | PRN
  Administered 2020-02-18 (×3): 50 ug via INTRAVENOUS

## 2020-02-18 SURGICAL SUPPLY — 23 items
ADH SKN CLS APL DERMABOND .7 (GAUZE/BANDAGES/DRESSINGS) ×1
CEMENT KYPHON CX01A KIT/MIXER (Cement) ×3 IMPLANT
COVER WAND RF STERILE (DRAPES) ×3 IMPLANT
DERMABOND ADVANCED (GAUZE/BANDAGES/DRESSINGS) ×2
DERMABOND ADVANCED .7 DNX12 (GAUZE/BANDAGES/DRESSINGS) ×1 IMPLANT
DEVICE BIOPSY BONE KYPHX (INSTRUMENTS) ×3 IMPLANT
DRAPE C-ARM XRAY 36X54 (DRAPES) ×3 IMPLANT
DURAPREP 26ML APPLICATOR (WOUND CARE) ×3 IMPLANT
FEE RENTAL RFA GENERATOR (MISCELLANEOUS) IMPLANT
GLOVE SURG SYN 9.0  PF PI (GLOVE) ×3
GLOVE SURG SYN 9.0 PF PI (GLOVE) ×1 IMPLANT
GOWN SRG 2XL LVL 4 RGLN SLV (GOWNS) ×1 IMPLANT
GOWN STRL NON-REIN 2XL LVL4 (GOWNS) ×3
GOWN STRL REUS W/ TWL LRG LVL3 (GOWN DISPOSABLE) ×1 IMPLANT
GOWN STRL REUS W/TWL LRG LVL3 (GOWN DISPOSABLE) ×3
MANIFOLD NEPTUNE II (INSTRUMENTS) ×3 IMPLANT
PACK KYPHOPLASTY (MISCELLANEOUS) ×3 IMPLANT
RENTAL RFA  GENERATOR (MISCELLANEOUS)
RENTAL RFA GENERATOR (MISCELLANEOUS) IMPLANT
STRAP SAFETY 5IN WIDE (MISCELLANEOUS) ×3 IMPLANT
SWABSTK COMLB BENZOIN TINCTURE (MISCELLANEOUS) ×3 IMPLANT
TRAY KYPHOPAK 15/3 EXPRESS 1ST (MISCELLANEOUS) ×5 IMPLANT
TRAY KYPHOPAK 20/3 EXPRESS 1ST (MISCELLANEOUS) ×1 IMPLANT

## 2020-02-18 NOTE — Op Note (Signed)
02/18/2020  1:21 PM  PATIENT:  [name]   MRN: [id number]   PRE-OPERATIVE DIAGNOSIS:  closed wedge compression fracture of L1 and L2   POST-OPERATIVE DIAGNOSIS:  closed wedge compression fracture of L1 and L2   PROCEDURE:  Procedure(s): KYPHOPLASTY L1 and L2  SURGEON: Laurene Footman, MD   ASSISTANTS: None   ANESTHESIA:   local and MAC   EBL:  No intake/output data recorded.   BLOOD ADMINISTERED:none   DRAINS: none    LOCAL MEDICATIONS USED:  MARCAINE    and XYLOCAINE    SPECIMEN:   L1 and L2 vertebral body biopsies   DISPOSITION OF SPECIMEN:  Pathology   COUNTS:  YES   TOURNIQUET:  * No tourniquets in log *   IMPLANTS: Bone cement   DICTATION: .Dragon Dictation  patient was brought to the operating room and after adequate anesthesia was obtained the patient was placed prone.  C arm was brought in in good visualization of the affected level obtained on both AP and lateral projections.  After patient identification and timeout procedures were completed, local anesthetic was infiltrated with 10 cc 1% Xylocaine infiltrated subcutaneously.  This is done the area on the each side of the planned approach.  The back was then prepped and draped in the usual sterile manner and repeat timeout procedure carried out.  A spinal needle was brought down to the pedicle on the each side of  L1 and L2 and a 50-50 mix of 1% Xylocaine half percent Sensorcaine with epinephrine total of 20 cc injected on each side.  After allowing this to set a small incision was made and the trocar was advanced into the vertebral body in an extrapedicular fashion on the right at L2 and on the left at L1.  Attempt to get access on the opposite side each level was unsuccessful Biopsy was obtained at each level Drilling was carried out balloon inserted with inflation to  2-1/2 cc on the right at L to and 2.0 cc on the left at L1.  When the cement was appropriate consistency 5 cc were injected on the right at L1  with good fill across the body, and 5-1/2 cc on the left into L2 into the vertebral body without extravasation, good fill superior to inferior endplates and from right to left sides along the inferior endplate.  After the cement had set the trochar was removed and permanent C-arm views obtained.  The wound was closed with Dermabond followed by Band-Aid   PLAN OF CARE:  Discharge home after recovery room   PATIENT DISPOSITION:  PACU - hemodynamically stable.

## 2020-02-18 NOTE — OR Nursing (Signed)
May resume taking aspirin tomorrow per Dr. Rudene Christians, secure chat - added to d/c instructions/med section.

## 2020-02-18 NOTE — H&P (Signed)
Chief Complaint  Patient presents with  . Follow-up  Low back pain, Per MRI L1-L2 compression fx and sacral insufficiency fractures    History of the Present Illness: [name] is a 59 y.o. female here today.   The patient presents for evaluation of of the lumbar spine and sacrum. The patient had MRIs performed at [facility] on 01/24/2019. A fracture of S5 vertebral body was seen on the sacral MRI . The lumbar MRI shows L1 and L2 compression fractures with minimal height loss.  The patient states she tripped and fell over her cat, landing on her tailbone, sliding into the refrigerator. This occurred in the middle of 12/2019. She has had severe pain since that time. She states she has had low back pain for years prior to her fall, and she has some bulging disks.  The patient is not on an anticoagulant. She has diabetes, and takes metformin, Invokana, and glipizide. Her primary care physician is Dellis Filbert, MD in [place].   The patient is from [place].   I have reviewed past medical, surgical, social and family history, and allergies as documented in the EMR.  Past Medical History: Past Medical History:  Diagnosis Date  . ADHD  . Arthritis  . Bipolar disorder, unspecified (CMS-HCC)  . Depression  . Diabetes mellitus type 2, uncomplicated (CMS-HCC)  . Dry eye syndrome of unspecified lacrimal gland  . Ectopic pregnancy 1982  . Hyperlipemia  . Insomnia  . Migraines  . Miscarriage 1987  . Neuromuscular disorder (CMS-HCC)  . Obesity, morbid, BMI 40.0-49.9 (CMS-HCC)  . Osteoarthritis  . PTSD (post-traumatic stress disorder)  . Thyroid disease   Past Surgical History: Past Surgical History:  Procedure Laterality Date  . CHOLECYSTECTOMY  . ENDOSCOPIC CARPAL TUNNEL RELEASE Bilateral  . HYSTERECTOMY  . knee surgery  . Left knee arthroscopy, partial medial meniscectomy, and chondroplasty 02/06/2019  Dr Marry Guan  . Right total knee arthroplasty using computer-assisted navigation  08/28/2017  Dr Marry Guan   Past Family History: Family History  Problem Relation Age of Onset  . Stroke Mother  . High blood pressure (Hypertension) Mother  . Diabetes Mother  . Arthritis Mother  . Cancer Father  . Diabetes Sister  . Hypercoagulable state (Tendency to form frequent blood clots) Sister  . Stroke Sister  . Diabetes Brother  . Cancer Maternal Grandmother  . Heart disease Paternal Grandmother  . Heart disease Paternal Grandfather   Medications: Current Outpatient Medications Ordered in Epic  Medication Sig Dispense Refill  . amoxicillin (AMOXIL) 500 MG capsule Take by mouth  . aspirin 81 MG EC tablet Take 81 mg by mouth once daily  . atorvastatin (LIPITOR) 40 MG tablet  . buPROPion (WELLBUTRIN SR) 100 MG SR tablet Take by mouth Take 1 tablet (100 mg total) by mouth daily.  . canagliflozin (INVOKANA) 300 mg Tab Take 300 mg by mouth once daily  . chlorhexidine (PERIDEX) 0.12 % solution  . clonazePAM (KLONOPIN) 0.5 MG tablet Take 0.5 mg by mouth 2 (two) times daily  . dulaglutide (TRULICITY) 1.5 ZC/5.8 mL subcutaneous injection Inject 1.5 mg subcutaneously every 7 (seven) days  . DULoxetine (CYMBALTA) 30 MG DR capsule  . escitalopram oxalate (LEXAPRO) 10 MG tablet  . eszopiclone (LUNESTA) 1 MG tablet Take by mouth  . fluticasone propionate (FLONASE) 50 mcg/actuation nasal spray  . gabapentin (NEURONTIN) 600 MG tablet Take by mouth  . glipiZIDE (GLUCOTROL) 5 MG tablet Take 5 mg by mouth every morning before breakfast  . HYDROcodone-acetaminophen (NORCO)  5-325 mg tablet Take by mouth  . ibuprofen (IBU) 800 MG tablet Take 1 tablet by mouth as needed  . levocetirizine (XYZAL) 5 MG tablet  . levothyroxine (SYNTHROID, LEVOTHROID) 150 MCG tablet Take 150 mcg by mouth once daily Take on an empty stomach with a glass of water at least 30-60 minutes before breakfast.  . metFORMIN (GLUCOPHAGE-XR) 500 MG XR tablet Take 4 tablets by mouth once daily  . methocarbamol (ROBAXIN) 500  MG tablet Take 500 mg by mouth 4 (four) times daily  . methylphenidate HCl (RITALIN) 10 MG tablet Take by mouth  . multivitamin tablet Take 1 tablet by mouth once daily.  Marland Kitchen NARCAN 4 mg/actuation nasal spray as directed  . oxyCODONE-acetaminophen (PERCOCET) 10-325 mg tablet Take 1 tablet by mouth every 4 (four) hours as needed  . pantoprazole (PROTONIX) 40 MG DR tablet Take by mouth Take 40 mg by mouth daily before breakfast.  . pioglitazone (ACTOS) 15 MG tablet Take by mouth Take 15 mg by mouth daily.  . pregabalin (LYRICA) 200 MG capsule Take 200 mg by mouth 3 (three) times daily  . RESTASIS 0.05 % ophthalmic emulsion  . simvastatin (ZOCOR) 40 MG tablet Take 40 mg by mouth nightly.  . SUMAtriptan (IMITREX) 100 MG tablet Take 100 mg by mouth once as needed for Migraine. May take a second dose after 2 hours if needed.  Marland Kitchen SYMPROIC 0.2 mg Tab  . ACCU-CHEK AVIVA PLUS TEST STRP test strip (Patient not taking: Reported on 01/31/2020 )  . ACCU-CHEK SOFTCLIX LANCETS lancets (Patient not taking: Reported on 01/31/2020 )   No current Epic-ordered facility-administered medications on file.   Allergies: No Known Allergies   Body mass index is 34.99 kg/m.  Review of Systems: A comprehensive 14 point ROS was performed, reviewed, and the pertinent orthopaedic findings are documented in the HPI.  Vitals:  01/31/20 1046  BP: 128/76    General Physical Examination:   General/Constitutional: No apparent distress: well-nourished and well developed. Eyes: Pupils equal, round with synchronous movement. Lungs: Clear to auscultation HEENT: Normal Vascular: No edema, swelling or tenderness, except as noted in detailed exam. Cardiac: Heart rate and rhythm is regular. Integumentary: No impressive skin lesions present, except as noted in detailed exam. Neuro/Psych: Normal mood and affect, oriented to person, place and time.  Musculoskeletal Examination:  On exam, no clonus. Severe pain at S5, L1  and L2.   Radiographs:  No new imaging studies were obtained or reviewed today.  Assessment: ICD-10-CM  1. Closed compression fracture of body of L1 vertebra (CMS-HCC) S32.010A  2. Closed wedge compression fracture of L2 vertebra, initial encounter (CMS-HCC) S32.020A   Plan:  The patient has clinical findings of fracture of the S5 vertebral body, as well as L1 and L2 compression fractures with minimal height loss.  We discussed the patient's prior MRI findings. I advised her there is nothing I can do with her S5 vertebral body fracture. I explained the kyphoplasty procedure and postoperative course in detail.   We will schedule the patient for surgery in the near future.  Surgical Risks:  The nature of the condition and the proposed procedure has been reviewed in detail with the patient. Surgical versus non-surgical options and prognosis for recovery have been reviewed and the inherent risks and benefits of each have been discussed including the risks of infection, bleeding, injury to nerves/blood vessels/tendons, incomplete relief of symptoms, persisting pain and/or stiffness, loss of function, complex regional pain syndrome, failure of the procedure, as  appropriate.  Teeth: Fair dentition, nothing loose or removeable   Attestation: I, Dawn Royse, am documenting for [employer], MD utilizing [place].   Reviewed  H+P. No changes noted.

## 2020-02-18 NOTE — Anesthesia Preprocedure Evaluation (Addendum)
Anesthesia Evaluation  Patient identified by MRN, date of birth, ID band Patient awake    Reviewed: Allergy & Precautions, H&P , NPO status , Patient's Chart, lab work & pertinent test results  History of Anesthesia Complications (+) PONVNegative for: history of anesthetic complications  Airway Mallampati: II  TM Distance: >3 FB     Dental   Pulmonary neg pulmonary ROS, neg sleep apnea, neg COPD, Patient abstained from smoking., former smoker,     + decreased breath sounds      Cardiovascular (-) angina(-) Past MI and (-) Cardiac Stents (-) dysrhythmias  Rhythm:regular Rate:Normal  Echo 2018 Mild LVH with LVEF 60-65% and grade 1 diastolic dysfunction.    Neuro/Psych  Headaches, PSYCHIATRIC DISORDERS Anxiety Depression Bipolar Disorder Schizophrenia    GI/Hepatic Neg liver ROS, GERD  Controlled,  Endo/Other  diabetesHypothyroidism   Renal/GU negative Renal ROS  negative genitourinary   Musculoskeletal   Abdominal   Peds  Hematology negative hematology ROS (+)   Anesthesia Other Findings Obesity  Past Medical History: No date: Anxiety No date: Bipolar disorder ([place]) No date: Complication of anesthesia No date: Degenerative disc disease, lumbar No date: Depression No date: Diabetes mellitus No date: Elevated cholesterol No date: Fibromyalgia No date: GERD (gastroesophageal reflux disease) No date: Hypothyroidism No date: Migraine No date: Neuropathy No date: Osteoarthritis No date: PONV (postoperative nausea and vomiting) No date: PTSD (post-traumatic stress disorder)     Comment:  after death of son (accidental overdose) No date: Thyroid disease  Past Surgical History: No date: BILATERAL OOPHORECTOMY bilateral: CARPAL TUNNEL RELEASE No date: CHOLECYSTECTOMY 05/01/2012: COLONOSCOPY WITH PROPOFOL; N/A     Comment:  GXQ:JJHER COLON polyps/Mild diverticulosis was noted in               the sigmoid  colon/Moderate sized internal hemorrhoids.               path with tubular adenoma No date: gallbladder 08/28/2017: KNEE ARTHROPLASTY; Right     Comment:  Procedure: COMPUTER ASSISTED TOTAL KNEE ARTHROPLASTY;                Surgeon: Dereck Leep, MD;  Location: [facility] ORS;                Service: Orthopedics;  Laterality: Right; No date: KNEE ARTHROSCOPY     Comment:  bilateral 02/06/2019: KNEE ARTHROSCOPY; Left     Comment:  Procedure: LEFT KNEE ARTHROSCOPY WITH  PARTIAL MEDIAL               MENISECTOMY WITH MEDIAL CHONDRALPLASTY;  Surgeon: Dereck Leep, MD;  Location: [facility] ORS;  Service: Orthopedics;               Laterality: Left; 10/29/2015: KNEE ARTHROSCOPY WITH MEDIAL MENISECTOMY; Right     Comment:  Procedure: ARTHROSCOPY RIGHT KNEE  WITH PARTIAL MEDIAL               MENISECTOMY;  Surgeon: Carole Civil, MD;  Location:              [facility];  Service: Orthopedics;  Laterality: Right; 05/01/2012: POLYPECTOMY; N/A     Comment:  Procedure: POLYPECTOMY;  Surgeon: Danie Binder, MD;                Location: [facility];  Service: Endoscopy;  Laterality: N/A; left: SHOULDER SURGERY No date: VAGINAL HYSTERECTOMY  BMI  Body Mass Index: 34.06 kg/m      Reproductive/Obstetrics negative OB ROS                            Anesthesia Physical Anesthesia Plan  ASA: III  Anesthesia Plan: General   Post-op Pain Management:    Induction:   PONV Risk Score and Plan: Propofol infusion and TIVA  Airway Management Planned: Simple Face Mask  Additional Equipment:   Intra-op Plan:   Post-operative Plan:   Informed Consent: I have reviewed the patients History and Physical, chart, labs and discussed the procedure including the risks, benefits and alternatives for the proposed anesthesia with the patient or authorized representative who has indicated his/her understanding and acceptance.     Dental Advisory Given  Plan Discussed with:  Anesthesiologist, CRNA and Surgeon  Anesthesia Plan Comments:         Anesthesia Quick Evaluation

## 2020-02-18 NOTE — Transfer of Care (Addendum)
Immediate Anesthesia Transfer of Care Note  Patient: [name]  Procedure(s) Performed: L1, L2 Kyphoplasty (N/A )  Patient Location: PACU  Anesthesia Type:General  Level of Consciousness: awake, oriented, drowsy and patient cooperative  Airway & Oxygen Therapy: Patient Spontanous Breathing  Post-op Assessment: Report given to RN and Post -op Vital signs reviewed and stable  Post vital signs: Reviewed and stable  Last Vitals:  Vitals Value Taken Time  BP 154/78 02/18/20 1326  Temp    Pulse 104 02/18/20 1328  Resp 22 02/18/20 1328  SpO2 99 % 02/18/20 1328  Vitals shown include unvalidated device data.  Last Pain:  Vitals:   02/18/20 1101  TempSrc: Temporal  PainSc: 8          Complications: No complications documented.

## 2020-02-18 NOTE — Discharge Instructions (Addendum)
Take it easy today and tomorrow. Remove Band-Aids on Thursday then okay to shower. Okay to walk is much as you want now but try to avoid bending and or lifting anything over 5 pounds for 2 weeks. Call office if you are having problems.   AMBULATORY SURGERY  DISCHARGE INSTRUCTIONS   1) The drugs that you were given will stay in your system until tomorrow so for the next 24 hours you should not:  A) Drive an automobile B) Make any legal decisions C) Drink any alcoholic beverage   2) You may resume regular meals tomorrow.  Today it is better to start with liquids and gradually work up to solid foods.  You may eat anything you prefer, but it is better to start with liquids, then soup and crackers, and gradually work up to solid foods.   3) Please notify your doctor immediately if you have any unusual bleeding, trouble breathing, redness and pain at the surgery site, drainage, fever, or pain not relieved by medication.    4) Additional Instructions:        Please contact your physician with any problems or Same Day Surgery at [phone], Monday through Friday 6 am to 4 pm, or  at [facility] number at [phone].

## 2020-02-20 LAB — SURGICAL PATHOLOGY

## 2020-02-20 NOTE — Anesthesia Postprocedure Evaluation (Signed)
Anesthesia Post Note  Patient: [name]  Procedure(s) Performed: L1, L2 Kyphoplasty (N/A )  Patient location during evaluation: PACU Anesthesia Type: General Level of consciousness: awake and alert Pain management: pain level controlled Vital Signs Assessment: post-procedure vital signs reviewed and stable Respiratory status: spontaneous breathing, nonlabored ventilation and respiratory function stable Cardiovascular status: blood pressure returned to baseline and stable Postop Assessment: no apparent nausea or vomiting Anesthetic complications: no   No complications documented.   Last Vitals:  Vitals:   02/18/20 1450 02/18/20 1513  BP: (!) 142/75 (!) 130/57  Pulse: 92 85  Resp: 16 16  Temp: 36.7 C   SpO2: 96% 99%    Last Pain:  Vitals:   02/19/20 0934  TempSrc:   PainSc: [place]
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
## 2020-02-27 DIAGNOSIS — R69 Illness, unspecified: Secondary | ICD-10-CM | POA: Diagnosis not present

## 2020-03-02 ENCOUNTER — Telehealth (INDEPENDENT_AMBULATORY_CARE_PROVIDER_SITE_OTHER): Payer: Medicare HMO | Admitting: Psychiatry

## 2020-03-02 ENCOUNTER — Other Ambulatory Visit: Payer: Self-pay

## 2020-03-02 ENCOUNTER — Telehealth (HOSPITAL_COMMUNITY): Payer: Medicare HMO | Admitting: Psychiatry

## 2020-03-02 ENCOUNTER — Encounter: Payer: Self-pay | Admitting: Psychiatry

## 2020-03-02 DIAGNOSIS — F3341 Major depressive disorder, recurrent, in partial remission: Secondary | ICD-10-CM

## 2020-03-02 DIAGNOSIS — F900 Attention-deficit hyperactivity disorder, predominantly inattentive type: Secondary | ICD-10-CM | POA: Diagnosis not present

## 2020-03-02 DIAGNOSIS — F3342 Major depressive disorder, recurrent, in full remission: Secondary | ICD-10-CM | POA: Diagnosis not present

## 2020-03-02 DIAGNOSIS — F4310 Post-traumatic stress disorder, unspecified: Secondary | ICD-10-CM

## 2020-03-02 MED ORDER — CLONAZEPAM 0.5 MG PO TABS: 0.5000 mg | ORAL_TABLET | Freq: Three times a day (TID) | ORAL | 1 refills | Status: DC | PRN

## 2020-03-02 MED ORDER — ESZOPICLONE 2 MG PO TABS: 2.0000 mg | ORAL_TABLET | Freq: Every evening | ORAL | 2 refills | Status: AC | PRN

## 2020-03-02 NOTE — Patient Instructions (Signed)
1. Continue bupropion 100 mg BID 2. ContinueQuetiapine200mg  at night 3.ContinueRitalin 10 mg twice a day  4  Continue clonazepam 0.5 mgthree timesa day for anxiety   5. Increase Lunesta 2 mg at night as needed for sleep 6.Next appointment:2/14 at 8:40

## 2020-03-03 ENCOUNTER — Other Ambulatory Visit: Payer: Self-pay | Admitting: Psychiatry

## 2020-03-03 ENCOUNTER — Telehealth: Payer: Self-pay

## 2020-03-03 DIAGNOSIS — Z9889 Other specified postprocedural states: Secondary | ICD-10-CM | POA: Diagnosis not present

## 2020-03-03 MED ORDER — METHYLPHENIDATE HCL 10 MG PO TABS: 10.0000 mg | ORAL_TABLET | Freq: Two times a day (BID) | ORAL | 0 refills | Status: DC

## 2020-03-03 NOTE — Telephone Encounter (Signed)
Ordered.   I have utilized the [place] Controlled Substances Reporting System (PMP AWARxE) to confirm adherence regarding the patient's medication. My review reveals appropriate prescription fills.

## 2020-03-03 NOTE — Telephone Encounter (Signed)
Pt called states that the ritalin was not sent in yesterday   methylphenidate (RITALIN) 10 MG tablet Medication Date: 01/13/2020 Department: [facility] Ordering/Authorizing: Norman Clay, MD    Order Providers  Prescribing Provider Encounter Provider  Norman Clay, MD Norman Clay, MD   Outpatient Medication Detail   Disp Refills Start End   methylphenidate (RITALIN) 10 MG tablet 60 tablet 0 02/04/2020 03/05/2020   Sig - Route: Take 1 tablet (10 mg total) by mouth in the morning and at bedtime. - Oral   Sent to pharmacy as: methylphenidate (RITALIN) 10 MG tablet   Earliest Fill Date: 02/04/2020   E-Prescribing Status: Receipt confirmed by pharmacy (01/13/2020 9:37 AM EDT)    Pharmacy  [facility] [place], [place]

## 2020-03-25 ENCOUNTER — Ambulatory Visit
Admission: EM | Admit: 2020-03-25 | Discharge: 2020-03-25 | Disposition: A | Discharge status: Home / Self Care | Payer: Medicare HMO | Attending: Emergency Medicine | Admitting: Emergency Medicine

## 2020-03-25 ENCOUNTER — Other Ambulatory Visit: Payer: Self-pay

## 2020-03-25 ENCOUNTER — Encounter: Payer: Self-pay | Admitting: Emergency Medicine

## 2020-03-25 DIAGNOSIS — R591 Generalized enlarged lymph nodes: Secondary | ICD-10-CM | POA: Diagnosis not present

## 2020-03-25 DIAGNOSIS — W5503XA Scratched by cat, initial encounter: Secondary | ICD-10-CM

## 2020-03-25 MED ORDER — AMOXICILLIN-POT CLAVULANATE 875-125 MG PO TABS: 1.0000 | ORAL_TABLET | Freq: Two times a day (BID) | ORAL | 0 refills | Status: DC

## 2020-03-25 NOTE — Discharge Instructions (Addendum)
Augmentin was prescribed/take as directed Follow-up with PCP Return or go to ED if you develop any new or worsening of symptoms

## 2020-03-25 NOTE — ED Provider Notes (Signed)
[place]   [id number] 03/25/20 Arrival Time: 1237   CC: Cat Scratch    SUBJECTIVE: History from: patient.  [name] is a 60 y.o. female who presents to urgent care for complaint of swallowing lymph node for the past 5 days.  She is reporting cat scratch 1 week ago.    Has tried OTC medication without relief.  Denies alleviating or aggravating factors.  Denies previous symptoms in the past.   Denies fever, chills, fatigue, sinus pain,, chest pain, nausea, changes in bowel or bladder habits.    ROS: As per HPI.  All other pertinent ROS negative.      Past Medical History:  Diagnosis Date  . Anxiety   . Bipolar disorder ([place])   . Complication of anesthesia   . Degenerative disc disease, lumbar   . Depression   . Diabetes mellitus   . Elevated cholesterol   . Fibromyalgia   . GERD (gastroesophageal reflux disease)   . Hypothyroidism   . Migraine   . Neuropathy   . Osteoarthritis   . PONV (postoperative nausea and vomiting)   . PTSD (post-traumatic stress disorder)    after death of son (accidental overdose)  . Thyroid disease    Past Surgical History:  Procedure Laterality Date  . BILATERAL OOPHORECTOMY    . CARPAL TUNNEL RELEASE  bilateral  . CHOLECYSTECTOMY    . COLONOSCOPY WITH PROPOFOL N/A 05/01/2012   MBW:GYKZL COLON polyps/Mild diverticulosis was noted in the sigmoid colon/Moderate sized internal hemorrhoids. path with tubular adenoma  . gallbladder    . KNEE ARTHROPLASTY Right 08/28/2017   Procedure: COMPUTER ASSISTED TOTAL KNEE ARTHROPLASTY;  Surgeon: Dereck Leep, MD;  Location: [facility];  Service: Orthopedics;  Laterality: Right;  . KNEE ARTHROSCOPY     bilateral  . KNEE ARTHROSCOPY Left 02/06/2019   Procedure: LEFT KNEE ARTHROSCOPY WITH  PARTIAL MEDIAL MENISECTOMY WITH MEDIAL CHONDRALPLASTY;  Surgeon: Dereck Leep, MD;  Location: [facility];  Service: Orthopedics;  Laterality: Left;  . KNEE ARTHROSCOPY WITH MEDIAL MENISECTOMY Right  10/29/2015   Procedure: ARTHROSCOPY RIGHT KNEE  WITH PARTIAL MEDIAL MENISECTOMY;  Surgeon: Carole Civil, MD;  Location: [facility];  Service: Orthopedics;  Laterality: Right;  . KYPHOPLASTY N/A 02/18/2020   Procedure: L1, L2 Kyphoplasty;  Surgeon: Hessie Knows, MD;  Location: [facility];  Service: Orthopedics;  Laterality: N/A;  . POLYPECTOMY N/A 05/01/2012   Procedure: POLYPECTOMY;  Surgeon: Danie Binder, MD;  Location: [facility];  Service: Endoscopy;  Laterality: N/A;  . SHOULDER SURGERY  left  . VAGINAL HYSTERECTOMY     No Known Allergies No current facility-administered medications on file prior to encounter.   Current Outpatient Medications on File Prior to Encounter  Medication Sig Dispense Refill  . aspirin EC 81 MG tablet Take 81 mg by mouth daily.     Marland Kitchen buPROPion (WELLBUTRIN SR) 100 MG 12 hr tablet Take 1 tablet (100 mg total) by mouth 2 (two) times daily. 180 tablet 0  . calcium-vitamin D (OSCAL WITH D) 500-200 MG-UNIT tablet Take 2 tablets by mouth daily with breakfast.    . canagliflozin (INVOKANA) 300 MG TABS tablet Take 300 mg by mouth daily. 30 tablet   . clonazePAM (KLONOPIN) 0.5 MG tablet Take 1 tablet (0.5 mg total) by mouth 3 (three) times daily as needed for anxiety. 90 tablet 1  . cycloSPORINE (RESTASIS) 0.05 % ophthalmic emulsion Place 1 drop into both eyes 2 (two) times daily.    . Dulaglutide (  TRULICITY) 1.5 DS/2.8JG SOPN Inject 1.5 mg into the skin every Saturday.     . DULoxetine (CYMBALTA) 60 MG capsule Take 1 capsule (60 mg total) by mouth at bedtime. 90 capsule 4  . eszopiclone (LUNESTA) 2 MG TABS tablet Take 1 tablet (2 mg total) by mouth at bedtime as needed for sleep. Take immediately before bedtime 30 tablet 2  . fluticasone (FLONASE) 50 MCG/ACT nasal spray Place 1 spray into both nostrils daily as needed for allergies or rhinitis.    Marland Kitchen glipiZIDE (GLUCOTROL XL) 5 MG 24 hr tablet Take 5 mg by mouth daily with breakfast.    . ibuprofen (ADVIL) 200 MG tablet Take  400 mg by mouth every 6 (six) hours as needed for mild pain.    Marland Kitchen levocetirizine (XYZAL) 5 MG tablet Take 5 mg by mouth daily as needed for allergies.     Marland Kitchen levothyroxine (SYNTHROID, LEVOTHROID) 150 MCG tablet Take 150 mcg by mouth daily before breakfast.    . metFORMIN (GLUCOPHAGE-XR) 500 MG 24 hr tablet Take 1,000 mg by mouth in the morning and at bedtime.    . methocarbamol (ROBAXIN) 750 MG tablet Take 750 mg by mouth every 8 (eight) hours as needed for muscle spasms.    . methylphenidate (RITALIN) 10 MG tablet Take 1 tablet (10 mg total) by mouth in the morning and at bedtime. 60 tablet 0  . methylphenidate (RITALIN) 10 MG tablet Take 1 tablet (10 mg total) by mouth in the morning and at bedtime. 60 tablet 0  . [START ON 04/02/2020] methylphenidate (RITALIN) 10 MG tablet Take 1 tablet (10 mg total) by mouth in the morning and at bedtime. 60 tablet 0  . [START ON 05/02/2020] methylphenidate (RITALIN) 10 MG tablet Take 1 tablet (10 mg total) by mouth in the morning and at bedtime. 60 tablet 0  . NARCAN 4 MG/0.1ML LIQD nasal spray kit Place 1 spray into the nose as needed. Overdose    . oxyCODONE-acetaminophen (PERCOCET) 10-325 MG tablet Take 1 tablet by mouth 4 (four) times daily as needed for pain.    . pantoprazole (PROTONIX) 40 MG tablet Take 40 mg by mouth daily before breakfast.     . pregabalin (LYRICA) 200 MG capsule Take 1 capsule (200 mg total) by mouth 3 (three) times daily. 270 capsule 4  . QUEtiapine (SEROQUEL) 200 MG tablet Take 1 tablet (200 mg total) by mouth at bedtime. (Patient not taking: Reported on 02/18/2020) 90 tablet 0  . simvastatin (ZOCOR) 40 MG tablet Take 1 tablet (40 mg total) by mouth daily. 30 tablet   . SUMAtriptan (IMITREX) 100 MG tablet One tablet po prn migraine.  May repeat in 2 hrs if needed.  Do not exceed 200 mg per 24 hrs. (Patient taking differently: Take 100 mg by mouth every 2 (two) hours as needed for migraine. One tablet po prn migraine.  May repeat in 2 hrs  if needed.  Do not exceed 200 mg per 24 hrs.) 5 tablet 0   Social History   Socioeconomic History  . Marital status: Single    Spouse name: Sonia Side  . Number of children: 2  . Years of education: 70  . Highest education level: High school graduate  Occupational History  . Occupation: unemployed    Fish farm manager: UNEMPLOYED  Tobacco Use  . Smoking status: Former Smoker    Packs/day: 1.00    Years: 13.00    Pack years: 13.00    Types: Cigarettes    Quit date:  02/06/2019    Years since quitting: 1.1  . Smokeless tobacco: Never Used  Vaping Use  . Vaping Use: Never used  Substance and Sexual Activity  . Alcohol use: No  . Drug use: No  . Sexual activity: Not Currently    Birth control/protection: Surgical  Other Topics Concern  . Not on file  Social History Narrative   Lives at home with her boyfriend and her son.   Right-handed.   No caffeine use.   Two children - one living, one deceased.   Social Determinants of Health   Financial Resource Strain: Not on file  Food Insecurity: Not on file  Transportation Needs: Not on file  Physical Activity: Not on file  Stress: Not on file  Social Connections: Not on file  Intimate Partner Violence: Not on file   Family History  Problem Relation Age of Onset  . Cancer Other   . Diabetes Other   . Heart disease Other   . Arthritis Other   . Stroke Mother   . Anxiety disorder Mother   . Heart failure Mother   . Depression Mother   . Cancer - Lung Father   . Lung disease Father   . Colon cancer Father        father, diagnosed in his late 14s   . Heart failure Sister   . Depression Sister   . Alcohol abuse Brother   . Lung disease Other   . Heart disease Maternal Grandfather   . Stroke Paternal Grandmother   . Drug abuse Other   . Drug abuse Son   . Drug abuse Son     OBJECTIVE:  Vitals:   03/25/20 1420 03/25/20 1421  BP: (!) 148/68   Pulse: 90   Resp: 18   Temp: 98.1 F (36.7 C)   TempSrc: Oral   SpO2: 99%    Weight:  220 lb (99.8 kg)  Height:  5' 6.5" (1.689 m)     Physical Exam Vitals and nursing note reviewed.  Constitutional:      General: She is not in acute distress.    Appearance: Normal appearance. She is normal weight. She is not ill-appearing, toxic-appearing or diaphoretic.  HENT:     Head: Normocephalic.  Cardiovascular:     Rate and Rhythm: Normal rate and regular rhythm.     Pulses: Normal pulses.     Heart sounds: Normal heart sounds. No murmur heard. No friction rub. No gallop.   Pulmonary:     Effort: Pulmonary effort is normal. No respiratory distress.     Breath sounds: Normal breath sounds. No stridor. No wheezing, rhonchi or rales.  Chest:     Chest wall: No tenderness.  Lymphadenopathy:     Head:     Left side of head: Occipital adenopathy present.     Comments: Tender to touch and freely movable lymph node.  Approximate 1 cm  Skin:    Findings: No erythema or lesion.  Neurological:     Mental Status: She is alert and oriented to person, place, and time.     LABS:  No results found for this or any previous visit (from the past 24 hour(s)).   ASSESSMENT & PLAN:  1. Cat scratch   2. Lymphadenopathy     Meds ordered this encounter  Medications  . amoxicillin-clavulanate (AUGMENTIN) 875-125 MG tablet    Sig: Take 1 tablet by mouth every 12 (twelve) hours.    Dispense:  14 tablet  Refill:  0    Discharge instructions  Augmentin was prescribed/take as directed Follow-up with PCP Return or go to ED if you develop any new or worsening of symptoms  Reviewed expectations re: course of current medical issues. Questions answered. Outlined signs and symptoms indicating need for more acute intervention. Patient verbalized understanding. After Visit Summary given.         Emerson Monte, [place] 03/25/20 1454

## 2020-03-25 NOTE — ED Triage Notes (Signed)
C/o knot on left side of neck x 4 days.

## 2020-03-25 NOTE — ED Triage Notes (Signed)
Recent cat scratches to bilateral hands

## 2020-04-08 DIAGNOSIS — S32029S Unspecified fracture of second lumbar vertebra, sequela: Secondary | ICD-10-CM | POA: Diagnosis not present

## 2020-04-08 DIAGNOSIS — M129 Arthropathy, unspecified: Secondary | ICD-10-CM | POA: Diagnosis not present

## 2020-04-08 DIAGNOSIS — Z79899 Other long term (current) drug therapy: Secondary | ICD-10-CM | POA: Diagnosis not present

## 2020-04-08 DIAGNOSIS — R635 Abnormal weight gain: Secondary | ICD-10-CM | POA: Diagnosis not present

## 2020-04-08 DIAGNOSIS — F17210 Nicotine dependence, cigarettes, uncomplicated: Secondary | ICD-10-CM | POA: Diagnosis not present

## 2020-04-08 DIAGNOSIS — M797 Fibromyalgia: Secondary | ICD-10-CM | POA: Diagnosis not present

## 2020-04-08 DIAGNOSIS — F17200 Nicotine dependence, unspecified, uncomplicated: Secondary | ICD-10-CM | POA: Diagnosis not present

## 2020-04-08 DIAGNOSIS — E559 Vitamin D deficiency, unspecified: Secondary | ICD-10-CM | POA: Diagnosis not present

## 2020-04-08 DIAGNOSIS — Z6834 Body mass index (BMI) 34.0-34.9, adult: Secondary | ICD-10-CM | POA: Diagnosis not present

## 2020-04-08 DIAGNOSIS — S32019S Unspecified fracture of first lumbar vertebra, sequela: Secondary | ICD-10-CM | POA: Diagnosis not present

## 2020-04-20 ENCOUNTER — Emergency Department: Payer: Medicare HMO

## 2020-04-20 ENCOUNTER — Other Ambulatory Visit: Payer: Self-pay

## 2020-04-20 ENCOUNTER — Emergency Department
Admission: EM | Admit: 2020-04-20 | Discharge: 2020-04-20 | Disposition: A | Discharge status: Home / Self Care | Payer: Medicare HMO | Attending: Emergency Medicine | Admitting: Emergency Medicine

## 2020-04-20 DIAGNOSIS — Y92512 Supermarket, store or market as the place of occurrence of the external cause: Secondary | ICD-10-CM | POA: Diagnosis not present

## 2020-04-20 DIAGNOSIS — Z7984 Long term (current) use of oral hypoglycemic drugs: Secondary | ICD-10-CM | POA: Diagnosis not present

## 2020-04-20 DIAGNOSIS — E039 Hypothyroidism, unspecified: Secondary | ICD-10-CM | POA: Insufficient documentation

## 2020-04-20 DIAGNOSIS — Z79899 Other long term (current) drug therapy: Secondary | ICD-10-CM | POA: Diagnosis not present

## 2020-04-20 DIAGNOSIS — Z794 Long term (current) use of insulin: Secondary | ICD-10-CM | POA: Diagnosis not present

## 2020-04-20 DIAGNOSIS — M25462 Effusion, left knee: Secondary | ICD-10-CM | POA: Diagnosis not present

## 2020-04-20 DIAGNOSIS — M2392 Unspecified internal derangement of left knee: Secondary | ICD-10-CM | POA: Diagnosis not present

## 2020-04-20 DIAGNOSIS — Z96651 Presence of right artificial knee joint: Secondary | ICD-10-CM | POA: Diagnosis not present

## 2020-04-20 DIAGNOSIS — Z7982 Long term (current) use of aspirin: Secondary | ICD-10-CM | POA: Diagnosis not present

## 2020-04-20 DIAGNOSIS — S8992XA Unspecified injury of left lower leg, initial encounter: Secondary | ICD-10-CM | POA: Diagnosis not present

## 2020-04-20 DIAGNOSIS — Z87891 Personal history of nicotine dependence: Secondary | ICD-10-CM | POA: Diagnosis not present

## 2020-04-20 DIAGNOSIS — E119 Type 2 diabetes mellitus without complications: Secondary | ICD-10-CM | POA: Insufficient documentation

## 2020-04-20 DIAGNOSIS — M25562 Pain in left knee: Secondary | ICD-10-CM | POA: Diagnosis present

## 2020-04-20 DIAGNOSIS — M1712 Unilateral primary osteoarthritis, left knee: Secondary | ICD-10-CM | POA: Diagnosis not present

## 2020-04-20 MED ORDER — MELOXICAM 15 MG PO TABS: 15.0000 mg | ORAL_TABLET | Freq: Every day | ORAL | 2 refills | Status: AC

## 2020-04-20 MED ORDER — TOBRAMYCIN 0.3 % OP SOLN: 2.0000 [drp] | OPHTHALMIC | 0 refills | Status: AC

## 2020-04-20 MED ORDER — HYDROCODONE-ACETAMINOPHEN 5-325 MG PO TABS
1.0000 | ORAL_TABLET | Freq: Once | ORAL | Status: AC
  Administered 2020-04-20: 1 via ORAL
  Filled 2020-04-20: qty 1

## 2020-04-20 NOTE — ED Provider Notes (Signed)
[facility] Emergency Department Provider Note  ____________________________________________   Event Date/Time   First MD Initiated Contact with Patient 04/20/20 1648     (approximate)  I have reviewed the triage vital signs and the nursing notes.   HISTORY  Chief Complaint left knee pain    HPI [name] is a 60 y.o. female presents emergency department complaining of left knee pain.  She was shopping for groceries and turned feeling a large pop in her left knee.  She has been unable to bear weight since the incident.  No other injury.  History of chronic pain.  Had a knee replacement on the right knee by Dr. Marry Guan    Past Medical History:  Diagnosis Date  . Anxiety   . Bipolar disorder ([place])   . Complication of anesthesia   . Degenerative disc disease, lumbar   . Depression   . Diabetes mellitus   . Elevated cholesterol   . Fibromyalgia   . GERD (gastroesophageal reflux disease)   . Hypothyroidism   . Migraine   . Neuropathy   . Osteoarthritis   . PONV (postoperative nausea and vomiting)   . PTSD (post-traumatic stress disorder)    after death of son (accidental overdose)  . Thyroid disease     Patient Active Problem List   Diagnosis Date Noted  . Gait abnormality 11/18/2019  . Chronic low back pain with left-sided sciatica 11/18/2019  . Polypharmacy 11/18/2019  . MDD (major depressive disorder), recurrent, in partial remission ([place]) 04/25/2019  . Attention deficit hyperactivity disorder (ADHD), predominantly inattentive type 04/25/2019  . Paresthesia 01/28/2019  . Peripheral neuropathy 09/18/2018  . MDD (major depressive disorder), recurrent episode, mild ([place]) 07/18/2018  . Diarrhea 04/04/2018  . Status post total right knee replacement 08/28/2017  . 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 Palpitations 06/01/2016  . Hyperlipidemia 06/01/2016  . Tobacco  abuse 06/01/2016  . Dyspnea on exertion 05/16/2016  . Left leg swelling 05/16/2016  . Diabetes mellitus ([place]) 05/16/2016  . Chest pain   . Swelling   . Acute medial meniscus tear of left knee   . Post traumatic stress disorder (PTSD) 11/08/2014  . MDD (major depressive disorder), recurrent severe, without psychosis ([place]) 11/08/2014  . Substance-induced psychotic disorder with hallucinations ([place]) 11/08/2014  . Panic disorder 11/08/2014  . Chronic pain syndrome 11/21/2013  . Primary osteoarthritis of both knees 11/21/2013  . Spinal stenosis of lumbar region 11/21/2013  . Difficulty in walking(719.7) 10/14/2013  . Sciatica associated with disorder of lumbar spine 09/17/2013  . Lower back pain 08/08/2013  . Lateral meniscus, posterior horn derangement 06/13/2013  . Arthritis of right knee 06/13/2013  . S/P medial meniscectomy of right knee 06/13/2013  . Left shoulder pain 08/28/2012  . Rotator cuff syndrome of left shoulder 08/28/2012  . Rotator cuff tear 08/28/2012  . Pes anserinus bursitis 05/16/2012  . Encounter for screening colonoscopy 04/16/2012  . Fatty liver 04/16/2012  . Back pain 07/26/2011  . Knee bursitis, left 07/26/2011  . OA (osteoarthritis) of knee 07/26/2011  . Spinal stenosis 10/28/2010  . Medial meniscus, posterior horn derangement 08/12/2010  . Knee pain 08/04/2010  . Sciatica 08/04/2010  . H N P-LUMBAR 01/14/2010  . 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 LUMBOSACRAL STRAIN 05/12/2009  . DERANGEMENT OF POSTERIOR HORN OF MEDIAL MENISCUS 01/19/2009  . BACK PAIN 01/19/2009  . LOWER LEG, ARTHRITIS, DEGEN./OSTEO 12/02/2008  . JOINT EFFUSION, KNEE 11/10/2008  .  ANSERINE BURSITIS, RIGHT 11/10/2008  . DERANGEMENT MENISCUS 10/15/2008  . KNEE PAIN 10/15/2008    Past Surgical History:  Procedure Laterality Date  . BILATERAL OOPHORECTOMY    . CARPAL TUNNEL RELEASE  bilateral  . CHOLECYSTECTOMY    . COLONOSCOPY WITH PROPOFOL N/A 05/01/2012   NFA:OZHYQ COLON  polyps/Mild diverticulosis was noted in the sigmoid colon/Moderate sized internal hemorrhoids. path with tubular adenoma  . gallbladder    . KNEE ARTHROPLASTY Right 08/28/2017   Procedure: COMPUTER ASSISTED TOTAL KNEE ARTHROPLASTY;  Surgeon: Dereck Leep, MD;  Location: [facility] ORS;  Service: Orthopedics;  Laterality: Right;  . KNEE ARTHROSCOPY     bilateral  . KNEE ARTHROSCOPY Left 02/06/2019   Procedure: LEFT KNEE ARTHROSCOPY WITH  PARTIAL MEDIAL MENISECTOMY WITH MEDIAL CHONDRALPLASTY;  Surgeon: Dereck Leep, MD;  Location: [facility] ORS;  Service: Orthopedics;  Laterality: Left;  . KNEE ARTHROSCOPY WITH MEDIAL MENISECTOMY Right 10/29/2015   Procedure: ARTHROSCOPY RIGHT KNEE  WITH PARTIAL MEDIAL MENISECTOMY;  Surgeon: Carole Civil, MD;  Location: [facility];  Service: Orthopedics;  Laterality: Right;  . KYPHOPLASTY N/A 02/18/2020   Procedure: L1, L2 Kyphoplasty;  Surgeon: Hessie Knows, MD;  Location: [facility] ORS;  Service: Orthopedics;  Laterality: N/A;  . POLYPECTOMY N/A 05/01/2012   Procedure: POLYPECTOMY;  Surgeon: Danie Binder, MD;  Location: [facility];  Service: Endoscopy;  Laterality: N/A;  . SHOULDER SURGERY  left  . VAGINAL HYSTERECTOMY      Prior to Admission medications   Medication Sig Start Date End Date Taking? Authorizing Provider  meloxicam (MOBIC) 15 MG tablet Take 1 tablet (15 mg total) by mouth daily. 04/20/20 04/20/21 Yes Rishab Stoudt, [place] Dolin, PA-C  tobramycin (TOBREX) 0.3 % ophthalmic solution Place 2 drops into both eyes every 4 (four) hours. 04/20/20  Yes Versie Starks, PA-C  aspirin EC 81 MG tablet Take 81 mg by mouth daily.     [provider]  buPROPion (WELLBUTRIN SR) 100 MG 12 hr tablet Take 1 tablet (100 mg total) by mouth 2 (two) times daily. 01/20/20   Norman Clay, MD  calcium-vitamin D (OSCAL WITH D) 500-200 MG-UNIT tablet Take 2 tablets by mouth daily with breakfast.    [provider]  canagliflozin (INVOKANA) 300 MG TABS tablet Take 300 mg by mouth  daily. 11/10/14   Niel Hummer, NP  clonazePAM (KLONOPIN) 0.5 MG tablet Take 1 tablet (0.5 mg total) by mouth 3 (three) times daily as needed for anxiety. 03/19/20   Norman Clay, MD  cycloSPORINE (RESTASIS) 0.05 % ophthalmic emulsion Place 1 drop into both eyes 2 (two) times daily.    [provider]  Dulaglutide (TRULICITY) 1.5 MV/7.8IO SOPN Inject 1.5 mg into the skin every Saturday.     [provider]  DULoxetine (CYMBALTA) 60 MG capsule Take 1 capsule (60 mg total) by mouth at bedtime. 11/18/19   Marcial Pacas, MD  eszopiclone (LUNESTA) 2 MG TABS tablet Take 1 tablet (2 mg total) by mouth at bedtime as needed for sleep. Take immediately before bedtime 03/02/20   Norman Clay, MD  fluticasone (FLONASE) 50 MCG/ACT nasal spray Place 1 spray into both nostrils daily as needed for allergies or rhinitis.    [provider]  glipiZIDE (GLUCOTROL XL) 5 MG 24 hr tablet Take 5 mg by mouth daily with breakfast.    [provider]  ibuprofen (ADVIL) 200 MG tablet Take 400 mg by mouth every 6 (six) hours as needed for mild pain.    [provider]  levocetirizine (XYZAL) 5 MG tablet Take 5 mg by mouth daily as needed for allergies.  09/11/18   [provider]  levothyroxine (SYNTHROID, LEVOTHROID) 150 MCG tablet Take 150 mcg by mouth daily before breakfast.    [provider]  metFORMIN (GLUCOPHAGE-XR) 500 MG 24 hr tablet Take 1,000 mg by mouth in the morning and at bedtime.    [provider]  methocarbamol (ROBAXIN) 750 MG tablet Take 750 mg by mouth every 8 (eight) hours as needed for muscle spasms. 01/29/20   [provider]  methylphenidate (RITALIN) 10 MG tablet Take 1 tablet (10 mg total) by mouth in the morning and at bedtime. 02/04/20 03/05/20  Norman Clay, MD  methylphenidate (RITALIN) 10 MG tablet Take 1 tablet (10 mg total) by mouth in the morning and at bedtime. 03/03/20 04/02/20  Norman Clay, MD  methylphenidate  (RITALIN) 10 MG tablet Take 1 tablet (10 mg total) by mouth in the morning and at bedtime. 04/02/20 05/02/20  Norman Clay, MD  methylphenidate (RITALIN) 10 MG tablet Take 1 tablet (10 mg total) by mouth in the morning and at bedtime. 05/02/20 06/01/20  Norman Clay, MD  NARCAN 4 MG/0.1ML LIQD nasal spray kit Place 1 spray into the nose as needed. Overdose 01/29/20   [provider]  oxyCODONE-acetaminophen (PERCOCET) 10-325 MG tablet Take 1 tablet by mouth 4 (four) times daily as needed for pain. 01/29/20   [provider]  pantoprazole (PROTONIX) 40 MG tablet Take 40 mg by mouth daily before breakfast.  06/29/17   [provider]  pregabalin (LYRICA) 200 MG capsule Take 1 capsule (200 mg total) by mouth 3 (three) times daily. 11/18/19   Marcial Pacas, MD  QUEtiapine (SEROQUEL) 200 MG tablet Take 1 tablet (200 mg total) by mouth at bedtime. Patient not taking: Reported on 02/18/2020 01/20/20   Norman Clay, MD  simvastatin (ZOCOR) 40 MG tablet Take 1 tablet (40 mg total) by mouth daily. 11/10/14   Niel Hummer, NP  SUMAtriptan (IMITREX) 100 MG tablet One tablet po prn migraine.  May repeat in 2 hrs if needed.  Do not exceed 200 mg per 24 hrs. Patient taking differently: Take 100 mg by mouth every 2 (two) hours as needed for migraine. One tablet po prn migraine.  May repeat in 2 hrs if needed.  Do not exceed 200 mg per 24 hrs. 06/26/12   Triplett, Lynelle Smoke, PA-C    Allergies Patient has no known allergies.  Family History  Problem Relation Age of Onset  . Cancer Other   . Diabetes Other   . Heart disease Other   . Arthritis Other   . Stroke Mother   . Anxiety disorder Mother   . Heart failure Mother   . Depression Mother   . Cancer - Lung Father   . Lung disease Father   . Colon cancer Father        father, diagnosed in his late 70s   . Heart failure Sister   . Depression Sister   . Alcohol abuse Brother   . Lung disease Other   . Heart disease Maternal Grandfather    . Stroke Paternal Grandmother   . Drug abuse Other   . Drug abuse Son   . Drug abuse Son     Social History Social History   Tobacco Use  . Smoking status: Former Smoker    Packs/day: 1.00    Years: 13.00    Pack years: 13.00  Types: Cigarettes    Quit date: 02/06/2019    Years since quitting: 1.2  . Smokeless tobacco: Never Used  Vaping Use  . Vaping Use: Never used  Substance Use Topics  . Alcohol use: No  . Drug use: No    Review of Systems  Constitutional: No fever/chills Eyes: No visual changes. ENT: No sore throat. Respiratory: Denies cough Genitourinary: Negative for dysuria. Musculoskeletal: Negative for back pain.  Positive for left knee pain Skin: Negative for rash. Psychiatric: no mood changes,     ____________________________________________   PHYSICAL EXAM:  VITAL SIGNS: ED Triage Vitals  Enc Vitals Group     BP 04/20/20 1421 125/66     Pulse Rate 04/20/20 1421 81     Resp 04/20/20 1421 18     Temp 04/20/20 1421 98.5 F (36.9 C)     Temp Source 04/20/20 1421 Oral     SpO2 04/20/20 1421 96 %     Weight --      Height --      Head Circumference --      Peak Flow --      Pain Score 04/20/20 1432 10     Pain Loc --      Pain Edu? --      Excl. in [place]? --     Constitutional: Alert and oriented. Well appearing and in no acute distress. Eyes: Conjunctivae injected in the left eye Head: Atraumatic. Nose: No congestion/rhinnorhea. Mouth/Throat: Mucous membranes are moist.   Neck:  supple no lymphadenopathy noted Cardiovascular: Normal rate, regular rhythm.  Respiratory: Normal respiratory effort.  No retractions, GU: deferred Musculoskeletal: Decreased range of motion of the left knee, left knee is tender at the joint line, neurovascular is intact  neurologic:  Normal speech and language.  Skin:  Skin is warm, dry and intact. No rash noted. Psychiatric: Mood and affect are normal. Speech and behavior are  normal.  ____________________________________________   LABS (all labs ordered are listed, but only abnormal results are displayed)  Labs Reviewed - No data to display ____________________________________________   ____________________________________________  RADIOLOGY  X-ray of the left knee and left tib-fib are negative  ____________________________________________   PROCEDURES  Procedure(s) performed: Knee immobilizer on walker applied and given by nursing staff   Procedures    ____________________________________________   INITIAL IMPRESSION / ASSESSMENT AND PLAN / ED COURSE  Pertinent labs & imaging results that were available during my care of the patient were reviewed by me and considered in my medical decision making (see chart for details).   The patient is a 60 year old female presents with left knee pain.  See HPI.  Physical exam shows left knee to be tender at the joint line.  Left eye has redness and some drainage, patient states matted in the morning  X-ray of the left knee and left tib-fib are both negative for fracture, joint effusion is noted.  This was reviewed by me and confirmed by radiology.  I did explain the findings to the patient.  She was placed in a knee immobilizer and given a walker.  Due to her chronic pain medications and PDMP 4 of 640, she cannot have a prescription for narcotics.  She was given a prescription for meloxicam.  Also noted while she was here that she has pinkeye in the left eye so she was also given eyedrops.  She was instructed to follow-up with Dr. Marry Guan as he did her last surgery and this is who she is requesting.  She is to call and schedule appointment.  Return emergency department worsening.  She is discharged stable condition in the care of her son     [name] was evaluated in Emergency Department on 04/20/2020 for the symptoms described in the history of present illness. She was evaluated in the context of the  global COVID-19 pandemic, which necessitated consideration that the patient might be at risk for infection with the SARS-CoV-2 virus that causes COVID-19. Institutional protocols and algorithms that pertain to the evaluation of patients at risk for COVID-19 are in a state of rapid change based on information released by regulatory bodies including the CDC and federal and state organizations. These policies and algorithms were followed during the patient's care in the ED.    As part of my medical decision making, I reviewed the following data within the [address] notes reviewed and incorporated, Old chart reviewed, Radiograph reviewed , Notes from prior ED visits and [place] Controlled Substance Database  ____________________________________________   FINAL CLINICAL IMPRESSION(S) / ED DIAGNOSES  Final diagnoses:  Knee effusion, left  Internal derangement of knee, acute, left      NEW MEDICATIONS STARTED DURING THIS VISIT:  New Prescriptions   MELOXICAM (MOBIC) 15 MG TABLET    Take 1 tablet (15 mg total) by mouth daily.   TOBRAMYCIN (TOBREX) 0.3 % OPHTHALMIC SOLUTION    Place 2 drops into both eyes every 4 (four) hours.     Note:  This document was prepared using Dragon voice recognition software and may include unintentional dictation errors.    Versie Starks, PA-C 04/20/20 1707    Lucrezia Starch, MD 04/21/20 [phone]

## 2020-04-20 NOTE — ED Notes (Signed)
Pt son at desk asking for pain meds for pt. Pt tearful, complaining of "excruciating" pain

## 2020-04-20 NOTE — ED Triage Notes (Signed)
Pt comes with c/o left knee pain. Pt states she was at the store and was walking when she felt a pop. Pt states she has since been unable to bear weight. Pt states pain.

## 2020-04-27 ENCOUNTER — Other Ambulatory Visit: Payer: Self-pay

## 2020-04-27 ENCOUNTER — Emergency Department: Payer: Medicare HMO

## 2020-04-27 ENCOUNTER — Emergency Department
Admission: EM | Admit: 2020-04-27 | Discharge: 2020-04-28 | Disposition: A | Discharge status: Home / Self Care | Payer: Medicare HMO | Attending: Emergency Medicine | Admitting: Emergency Medicine

## 2020-04-27 DIAGNOSIS — J40 Bronchitis, not specified as acute or chronic: Secondary | ICD-10-CM | POA: Insufficient documentation

## 2020-04-27 DIAGNOSIS — M25519 Pain in unspecified shoulder: Secondary | ICD-10-CM | POA: Diagnosis not present

## 2020-04-27 DIAGNOSIS — Z79899 Other long term (current) drug therapy: Secondary | ICD-10-CM | POA: Insufficient documentation

## 2020-04-27 DIAGNOSIS — M791 Myalgia, unspecified site: Secondary | ICD-10-CM | POA: Diagnosis not present

## 2020-04-27 DIAGNOSIS — E039 Hypothyroidism, unspecified: Secondary | ICD-10-CM | POA: Insufficient documentation

## 2020-04-27 DIAGNOSIS — Z87891 Personal history of nicotine dependence: Secondary | ICD-10-CM | POA: Insufficient documentation

## 2020-04-27 DIAGNOSIS — R079 Chest pain, unspecified: Secondary | ICD-10-CM | POA: Diagnosis not present

## 2020-04-27 DIAGNOSIS — Z7984 Long term (current) use of oral hypoglycemic drugs: Secondary | ICD-10-CM | POA: Diagnosis not present

## 2020-04-27 DIAGNOSIS — Z96651 Presence of right artificial knee joint: Secondary | ICD-10-CM | POA: Insufficient documentation

## 2020-04-27 DIAGNOSIS — R059 Cough, unspecified: Secondary | ICD-10-CM | POA: Diagnosis not present

## 2020-04-27 DIAGNOSIS — Z7982 Long term (current) use of aspirin: Secondary | ICD-10-CM | POA: Insufficient documentation

## 2020-04-27 DIAGNOSIS — E1140 Type 2 diabetes mellitus with diabetic neuropathy, unspecified: Secondary | ICD-10-CM | POA: Diagnosis not present

## 2020-04-27 DIAGNOSIS — I10 Essential (primary) hypertension: Secondary | ICD-10-CM | POA: Diagnosis not present

## 2020-04-27 DIAGNOSIS — R110 Nausea: Secondary | ICD-10-CM | POA: Insufficient documentation

## 2020-04-27 DIAGNOSIS — M542 Cervicalgia: Secondary | ICD-10-CM | POA: Insufficient documentation

## 2020-04-27 DIAGNOSIS — Z20822 Contact with and (suspected) exposure to covid-19: Secondary | ICD-10-CM | POA: Insufficient documentation

## 2020-04-27 LAB — COMPREHENSIVE METABOLIC PANEL
ALT: 40 U/L (ref 0–44)
AST: 33 U/L (ref 15–41)
Albumin: 4.5 g/dL (ref 3.5–5.0)
Alkaline Phosphatase: 103 U/L (ref 38–126)
Anion gap: 10 (ref 5–15)
BUN: 19 mg/dL (ref 8–23)
CO2: 26 mmol/L (ref 22–32)
Calcium: 9.2 mg/dL (ref 8.9–10.3)
Chloride: 102 mmol/L (ref 98–111)
Creatinine, Ser: 0.79 mg/dL (ref 0.44–1.00)
GFR, Estimated: >60 mL/min (ref >60)
Glucose, Bld: 196 mg/dL — ABNORMAL HIGH (ref 70–99)
Potassium: 4.5 mmol/L (ref 3.5–5.1)
Sodium: 138 mmol/L (ref 135–145)
Total Bilirubin: 0.5 mg/dL (ref 0.3–1.2)
Total Protein: 7.7 g/dL (ref 6.5–8.1)

## 2020-04-27 LAB — CBC
HCT: 47.2 % — ABNORMAL HIGH (ref 36.0–46.0)
Hemoglobin: 15.7 g/dL — ABNORMAL HIGH (ref 12.0–15.0)
MCH: 30.7 pg (ref 26.0–34.0)
MCHC: 33.3 g/dL (ref 30.0–36.0)
MCV: 92.2 fL (ref 80.0–100.0)
Platelets: 306 10*3/uL (ref 150–400)
RBC: 5.12 MIL/uL — ABNORMAL HIGH (ref 3.87–5.11)
RDW: 13.1 % (ref 11.5–15.5)
WBC: 9.5 10*3/uL (ref 4.0–10.5)
nRBC: 0 % (ref 0.0–0.2)

## 2020-04-27 LAB — TROPONIN I (HIGH SENSITIVITY): Troponin I (High Sensitivity): 4 ng/L (ref <18)

## 2020-04-27 LAB — LIPASE, BLOOD: Lipase: 26 U/L (ref 11–51)

## 2020-04-27 MED ORDER — PREDNISONE 20 MG PO TABS: 40.0000 mg | ORAL_TABLET | Freq: Every day | ORAL | 0 refills | Status: AC

## 2020-04-27 MED ORDER — AZITHROMYCIN 250 MG PO TABS: ORAL_TABLET | ORAL | 0 refills | Status: AC

## 2020-04-27 MED ORDER — BENZONATATE 100 MG PO CAPS: 100.0000 mg | ORAL_CAPSULE | Freq: Three times a day (TID) | ORAL | 0 refills | Status: AC | PRN

## 2020-04-27 MED ORDER — PREDNISONE 20 MG PO TABS
60.0000 mg | ORAL_TABLET | Freq: Once | ORAL | Status: AC
  Administered 2020-04-27: 60 mg via ORAL
  Filled 2020-04-27: qty 3

## 2020-04-27 MED ORDER — IPRATROPIUM-ALBUTEROL 0.5-2.5 (3) MG/3ML IN SOLN
3.0000 mL | Freq: Once | RESPIRATORY_TRACT | Status: AC
  Administered 2020-04-27: 3 mL via RESPIRATORY_TRACT
  Filled 2020-04-27: qty 3

## 2020-04-27 NOTE — ED Triage Notes (Addendum)
Pt comes with c/o abdominal pain and some nausea. Pt state she was out eating dinner and just began nauseated and felt her stomach was not right.  Pt is A&OX4. Pt denies any CP or SOB.

## 2020-04-27 NOTE — ED Provider Notes (Signed)
[facility] Emergency Department Provider Note  ____________________________________________   Event Date/Time   First MD Initiated Contact with Patient 04/27/20 2042     (approximate)  I have reviewed the triage vital signs and the nursing notes.   HISTORY  Chief Complaint Abdominal Pain    HPI [name] is a 60 y.o. female with history of bipolar disorder, hypertension, hyperlipidemia, diabetes, here with plans.  The patient states that her primary complaint has been cough which has been fairly constant over the last days.  She said associated nausea, generalized body aches, and fatigue.  She has had some associated intermittent abdominal pain but this has not been constant.  She states that the cough is kept her up at night.  She is also developed a an aching, throbbing, paraspinal neck and shoulder pain that is worse when she coughs.  She feels it is from coughing so much.  She said increased stress.  Denies any known fevers but has had chills.  Denies any specific sick contacts.  No other complaints.        Past Medical History:  Diagnosis Date  . Anxiety   . Bipolar disorder ([place])   . Complication of anesthesia   . Degenerative disc disease, lumbar   . Depression   . Diabetes mellitus   . Elevated cholesterol   . Fibromyalgia   . GERD (gastroesophageal reflux disease)   . Hypothyroidism   . Migraine   . Neuropathy   . Osteoarthritis   . PONV (postoperative nausea and vomiting)   . PTSD (post-traumatic stress disorder)    after death of son (accidental overdose)  . Thyroid disease     Patient Active Problem List   Diagnosis Date Noted  . Gait abnormality 11/18/2019  . Chronic low back pain with left-sided sciatica 11/18/2019  . Polypharmacy 11/18/2019  . MDD (major depressive disorder), recurrent, in partial remission ([place]) 04/25/2019  . Attention deficit hyperactivity disorder (ADHD), predominantly inattentive type 04/25/2019   . Paresthesia 01/28/2019  . Peripheral neuropathy 09/18/2018  . MDD (major depressive disorder), recurrent episode, mild ([place]) 07/18/2018  . Diarrhea 04/04/2018  . Status post total right knee replacement 08/28/2017  . 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 Palpitations 06/01/2016  . Hyperlipidemia 06/01/2016  . Tobacco abuse 06/01/2016  . Dyspnea on exertion 05/16/2016  . Left leg swelling 05/16/2016  . Diabetes mellitus ([place]) 05/16/2016  . Chest pain   . Swelling   . Acute medial meniscus tear of left knee   . Post traumatic stress disorder (PTSD) 11/08/2014  . MDD (major depressive disorder), recurrent severe, without psychosis ([place]) 11/08/2014  . Substance-induced psychotic disorder with hallucinations ([place]) 11/08/2014  . Panic disorder 11/08/2014  . Chronic pain syndrome 11/21/2013  . Primary osteoarthritis of both knees 11/21/2013  . Spinal stenosis of lumbar region 11/21/2013  . Difficulty in walking(719.7) 10/14/2013  . Sciatica associated with disorder of lumbar spine 09/17/2013  . Lower back pain 08/08/2013  . Lateral meniscus, posterior horn derangement 06/13/2013  . Arthritis of right knee 06/13/2013  . S/P medial meniscectomy of right knee 06/13/2013  . Left shoulder pain 08/28/2012  . Rotator cuff syndrome of left shoulder 08/28/2012  . Rotator cuff tear 08/28/2012  . Pes anserinus bursitis 05/16/2012  . Encounter for screening colonoscopy 04/16/2012  . Fatty liver 04/16/2012  . Back pain 07/26/2011  . Knee bursitis, left 07/26/2011  . OA (osteoarthritis) of knee 07/26/2011  .  Spinal stenosis 10/28/2010  . Medial meniscus, posterior horn derangement 08/12/2010  . Knee pain 08/04/2010  . Sciatica 08/04/2010  . H N P-LUMBAR 01/14/2010  . 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 LUMBOSACRAL STRAIN 05/12/2009  . DERANGEMENT OF POSTERIOR HORN OF MEDIAL MENISCUS 01/19/2009  . BACK PAIN  01/19/2009  . LOWER LEG, ARTHRITIS, DEGEN./OSTEO 12/02/2008  . JOINT EFFUSION, KNEE 11/10/2008  . ANSERINE BURSITIS, RIGHT 11/10/2008  . DERANGEMENT MENISCUS 10/15/2008  . KNEE PAIN 10/15/2008    Past Surgical History:  Procedure Laterality Date  . BILATERAL OOPHORECTOMY    . CARPAL TUNNEL RELEASE  bilateral  . CHOLECYSTECTOMY    . COLONOSCOPY WITH PROPOFOL N/A 05/01/2012   RDE:YCXKG COLON polyps/Mild diverticulosis was noted in the sigmoid colon/Moderate sized internal hemorrhoids. path with tubular adenoma  . gallbladder    . KNEE ARTHROPLASTY Right 08/28/2017   Procedure: COMPUTER ASSISTED TOTAL KNEE ARTHROPLASTY;  Surgeon: Dereck Leep, MD;  Location: [facility] ORS;  Service: Orthopedics;  Laterality: Right;  . KNEE ARTHROSCOPY     bilateral  . KNEE ARTHROSCOPY Left 02/06/2019   Procedure: LEFT KNEE ARTHROSCOPY WITH  PARTIAL MEDIAL MENISECTOMY WITH MEDIAL CHONDRALPLASTY;  Surgeon: Dereck Leep, MD;  Location: [facility] ORS;  Service: Orthopedics;  Laterality: Left;  . KNEE ARTHROSCOPY WITH MEDIAL MENISECTOMY Right 10/29/2015   Procedure: ARTHROSCOPY RIGHT KNEE  WITH PARTIAL MEDIAL MENISECTOMY;  Surgeon: Carole Civil, MD;  Location: [facility];  Service: Orthopedics;  Laterality: Right;  . KYPHOPLASTY N/A 02/18/2020   Procedure: L1, L2 Kyphoplasty;  Surgeon: Hessie Knows, MD;  Location: [facility] ORS;  Service: Orthopedics;  Laterality: N/A;  . POLYPECTOMY N/A 05/01/2012   Procedure: POLYPECTOMY;  Surgeon: Danie Binder, MD;  Location: [facility];  Service: Endoscopy;  Laterality: N/A;  . SHOULDER SURGERY  left  . VAGINAL HYSTERECTOMY      Prior to Admission medications   Medication Sig Start Date End Date Taking? Authorizing Provider  azithromycin (ZITHROMAX Z-PAK) 250 MG tablet Take 2 tablets (500 mg) on  Day 1,  followed by 1 tablet (250 mg) once daily on Days 2 through 5. 04/27/20  Yes Duffy Bruce, MD  benzonatate (TESSALON PERLES) 100 MG capsule Take 1 capsule (100 mg total) by mouth  3 (three) times daily as needed for cough. 04/27/20 04/27/21 Yes Duffy Bruce, MD  predniSONE (DELTASONE) 20 MG tablet Take 2 tablets (40 mg total) by mouth daily for 5 days. 04/27/20 05/02/20 Yes Duffy Bruce, MD  aspirin EC 81 MG tablet Take 81 mg by mouth daily.     [provider]  buPROPion (WELLBUTRIN SR) 100 MG 12 hr tablet Take 1 tablet (100 mg total) by mouth 2 (two) times daily. 01/20/20   Norman Clay, MD  calcium-vitamin D (OSCAL WITH D) 500-200 MG-UNIT tablet Take 2 tablets by mouth daily with breakfast.    [provider]  canagliflozin (INVOKANA) 300 MG TABS tablet Take 300 mg by mouth daily. 11/10/14   Niel Hummer, NP  clonazePAM (KLONOPIN) 0.5 MG tablet Take 1 tablet (0.5 mg total) by mouth 3 (three) times daily as needed for anxiety. 03/19/20   Norman Clay, MD  cycloSPORINE (RESTASIS) 0.05 % ophthalmic emulsion Place 1 drop into both eyes 2 (two) times daily.    [provider]  Dulaglutide (TRULICITY) 1.5 YJ/8.5UD SOPN Inject 1.5 mg into the skin every Saturday.     [provider]  DULoxetine (CYMBALTA) 60 MG capsule Take 1 capsule (60 mg total) by mouth at bedtime.  11/18/19   Marcial Pacas, MD  eszopiclone (LUNESTA) 2 MG TABS tablet Take 1 tablet (2 mg total) by mouth at bedtime as needed for sleep. Take immediately before bedtime 03/02/20   Norman Clay, MD  fluticasone (FLONASE) 50 MCG/ACT nasal spray Place 1 spray into both nostrils daily as needed for allergies or rhinitis.    [provider]  glipiZIDE (GLUCOTROL XL) 5 MG 24 hr tablet Take 5 mg by mouth daily with breakfast.    [provider]  ibuprofen (ADVIL) 200 MG tablet Take 400 mg by mouth every 6 (six) hours as needed for mild pain.    [provider]  levocetirizine (XYZAL) 5 MG tablet Take 5 mg by mouth daily as needed for allergies.  09/11/18   [provider]  levothyroxine (SYNTHROID, LEVOTHROID) 150 MCG tablet Take 150 mcg by mouth daily  before breakfast.    [provider]  meloxicam (MOBIC) 15 MG tablet Take 1 tablet (15 mg total) by mouth daily. 04/20/20 04/20/21  Fisher, Linden Dolin, PA-C  metFORMIN (GLUCOPHAGE-XR) 500 MG 24 hr tablet Take 1,000 mg by mouth in the morning and at bedtime.    [provider]  methocarbamol (ROBAXIN) 750 MG tablet Take 750 mg by mouth every 8 (eight) hours as needed for muscle spasms. 01/29/20   [provider]  methylphenidate (RITALIN) 10 MG tablet Take 1 tablet (10 mg total) by mouth in the morning and at bedtime. 02/04/20 03/05/20  Norman Clay, MD  methylphenidate (RITALIN) 10 MG tablet Take 1 tablet (10 mg total) by mouth in the morning and at bedtime. 03/03/20 04/02/20  Norman Clay, MD  methylphenidate (RITALIN) 10 MG tablet Take 1 tablet (10 mg total) by mouth in the morning and at bedtime. 04/02/20 05/02/20  Norman Clay, MD  methylphenidate (RITALIN) 10 MG tablet Take 1 tablet (10 mg total) by mouth in the morning and at bedtime. 05/02/20 06/01/20  Norman Clay, MD  NARCAN 4 MG/0.1ML LIQD nasal spray kit Place 1 spray into the nose as needed. Overdose 01/29/20   [provider]  oxyCODONE-acetaminophen (PERCOCET) 10-325 MG tablet Take 1 tablet by mouth 4 (four) times daily as needed for pain. 01/29/20   [provider]  pantoprazole (PROTONIX) 40 MG tablet Take 40 mg by mouth daily before breakfast.  06/29/17   [provider]  pregabalin (LYRICA) 200 MG capsule Take 1 capsule (200 mg total) by mouth 3 (three) times daily. 11/18/19   Marcial Pacas, MD  QUEtiapine (SEROQUEL) 200 MG tablet Take 1 tablet (200 mg total) by mouth at bedtime. Patient not taking: Reported on 02/18/2020 01/20/20   Norman Clay, MD  simvastatin (ZOCOR) 40 MG tablet Take 1 tablet (40 mg total) by mouth daily. 11/10/14   Niel Hummer, NP  SUMAtriptan (IMITREX) 100 MG tablet One tablet po prn migraine.  May repeat in 2 hrs if needed.  Do not exceed 200 mg per 24 hrs. Patient  taking differently: Take 100 mg by mouth every 2 (two) hours as needed for migraine. One tablet po prn migraine.  May repeat in 2 hrs if needed.  Do not exceed 200 mg per 24 hrs. 06/26/12   Triplett, Tammy, PA-C  tobramycin (TOBREX) 0.3 % ophthalmic solution Place 2 drops into both eyes every 4 (four) hours. 04/20/20   Versie Starks, PA-C    Allergies Patient has no known allergies.  Family History  Problem Relation Age of Onset  . Cancer Other   . Diabetes  Other   . Heart disease Other   . Arthritis Other   . Stroke Mother   . Anxiety disorder Mother   . Heart failure Mother   . Depression Mother   . Cancer - Lung Father   . Lung disease Father   . Colon cancer Father        father, diagnosed in his late 27s   . Heart failure Sister   . Depression Sister   . Alcohol abuse Brother   . Lung disease Other   . Heart disease Maternal Grandfather   . Stroke Paternal Grandmother   . Drug abuse Other   . Drug abuse Son   . Drug abuse Son     Social History Social History   Tobacco Use  . Smoking status: Former Smoker    Packs/day: 1.00    Years: 13.00    Pack years: 13.00    Types: Cigarettes    Quit date: 02/06/2019    Years since quitting: 1.2  . Smokeless tobacco: Never Used  Vaping Use  . Vaping Use: Never used  Substance Use Topics  . Alcohol use: No  . Drug use: No    Review of Systems  Review of Systems  Constitutional: Positive for fatigue. Negative for fever.  HENT: Positive for congestion and rhinorrhea. Negative for sore throat.   Eyes: Negative for visual disturbance.  Respiratory: Positive for cough. Negative for shortness of breath.   Cardiovascular: Negative for chest pain.  Gastrointestinal: Positive for nausea. Negative for abdominal pain, diarrhea and vomiting.  Genitourinary: Negative for flank pain.  Musculoskeletal: Negative for back pain and neck pain.  Skin: Negative for rash and wound.  Neurological: Positive for weakness.  All other  systems reviewed and are negative.    ____________________________________________  PHYSICAL EXAM:      VITAL SIGNS: ED Triage Vitals  Enc Vitals Group     BP 04/27/20 1838 131/64     Pulse Rate 04/27/20 1838 74     Resp 04/27/20 1838 18     Temp 04/27/20 1838 98.5 F (36.9 C)     Temp Source 04/27/20 2153 Oral     SpO2 04/27/20 1838 96 %     Weight 04/27/20 1838 220 lb (99.8 kg)     Height 04/27/20 1838 _0  (1.676 m)     Head Circumference --      Peak Flow --      Pain Score 04/27/20 1838 4     Pain Loc --      Pain Edu? --      Excl. in I[place]? --      Physical Exam Vitals and nursing note reviewed.  Constitutional:      General: She is not in acute distress.    Appearance: She is well-developed.  HENT:     Head: Normocephalic and atraumatic.  Eyes:     Conjunctiva/sclera: Conjunctivae normal.  Cardiovascular:     Rate and Rhythm: Normal rate and regular rhythm.     Heart sounds: Normal heart sounds. No murmur heard. No friction rub.  Pulmonary:     Effort: Pulmonary effort is normal. No respiratory distress.     Breath sounds: Wheezing (Scant, expiratory only) present. No rales.  Abdominal:     General: There is no distension.     Palpations: Abdomen is soft.     Tenderness: There is no abdominal tenderness.     Comments: No focal abdominal tenderness to palpation  Musculoskeletal:  Cervical back: Neck supple.  Skin:    General: Skin is warm.     Capillary Refill: Capillary refill takes less than 2 seconds.  Neurological:     Mental Status: She is alert and oriented to person, place, and time.     Motor: No abnormal muscle tone.       ____________________________________________   LABS (all labs ordered are listed, but only abnormal results are displayed)  Labs Reviewed  COMPREHENSIVE METABOLIC PANEL - Abnormal; Notable for the following components:      Result Value   Glucose, Bld 196 (*)    All other components within normal limits  CBC -  Abnormal; Notable for the following components:   RBC 5.12 (*)    Hemoglobin 15.7 (*)    HCT 47.2 (*)    All other components within normal limits  SARS CORONAVIRUS 2 (TAT 6-24 HRS)  LIPASE, BLOOD  TROPONIN I (HIGH SENSITIVITY)    ____________________________________________  EKG: Normal sinus rhythm, VR 76. QRS 96, QTc 449. No acute ST elevations. No ischemia or infarct. ________________________________________  RADIOLOGY All imaging, including plain films, CT scans, and ultrasounds, independently reviewed by me, and interpretations confirmed via formal radiology reads.  ED MD interpretation:   Chest x-ray: Clear  Official radiology report(s): DG Chest 2 View  Result Date: 04/27/2020 CLINICAL DATA:  Cough, chest pain EXAM: CHEST - 2 VIEW COMPARISON:  CT 10/10/2017, radiograph 10/10/2017 FINDINGS: No consolidation, features of edema, pneumothorax, or effusion. Pulmonary vascularity is normally distributed. The cardiomediastinal contours are unremarkable. No acute osseous or soft tissue abnormality. IMPRESSION: No acute cardiopulmonary abnormality. Electronically Signed   By: Lovena Le M.D.   On: 04/27/2020 21:48    ____________________________________________  PROCEDURES   Procedure(s) performed (including Critical Care):  Procedures  ____________________________________________  INITIAL IMPRESSION / MDM / [place] / ED COURSE  As part of my medical decision making, I reviewed the following data within the [place] notes reviewed and incorporated, Old chart reviewed, Notes from prior ED visits, and [place] [employer] was evaluated in Emergency Department on 04/28/2020 for the symptoms described in the history of present illness. She was evaluated in the context of the global COVID-19 pandemic, which necessitated consideration that the patient might be at risk for infection with the SARS-CoV-2  virus that causes COVID-19. Institutional protocols and algorithms that pertain to the evaluation of patients at risk for COVID-19 are in a state of rapid change based on information released by regulatory bodies including the CDC and federal and state organizations. These policies and algorithms were followed during the patient's care in the ED.  Some ED evaluations and interventions may be delayed as a result of limited staffing during the pandemic.*     Medical Decision Making:  60 yo F here with cough, fatigue, nausea, transient abd pain. Suspect viral URI vs COVID-19, with likely component of COPD given wheezing and extensive smoking history. Pt is non-toxic and in NAD. Satting well on RA. Labs reviewed as above. No significant leukocytosis. CMP at baseline. Trop neg, EKG nonischemic. CXR clear. COVID is pending.  Will treat for URI w/ possible COPD component, d/c with outpt COVID test and good return precautions. No apparent emergent pathology. Her n/v that was reported was transient and her abdomen is soft, NT, ND, with normal LFTs/labs and no signs of intra-abd pathology.   ____________________________________________  FINAL CLINICAL IMPRESSION(S) / ED DIAGNOSES  Final diagnoses:  Bronchitis  Cough     MEDICATIONS GIVEN DURING THIS VISIT:  Medications  predniSONE (DELTASONE) tablet 60 mg (60 mg Oral Given 04/27/20 2229)  ipratropium-albuterol (DUONEB) 0.5-2.5 (3) MG/3ML nebulizer solution 3 mL (3 mLs Nebulization Given 04/27/20 2230)     ED Discharge Orders         Ordered    predniSONE (DELTASONE) 20 MG tablet  Daily        04/27/20 2338    azithromycin (ZITHROMAX Z-PAK) 250 MG tablet        04/27/20 2338    benzonatate (TESSALON PERLES) 100 MG capsule  3 times daily PRN        04/27/20 2338           Note:  This document was prepared using Dragon voice recognition software and may include unintentional dictation errors.   Duffy Bruce, MD 04/28/20 [phone]

## 2020-04-28 DIAGNOSIS — J40 Bronchitis, not specified as acute or chronic: Secondary | ICD-10-CM | POA: Diagnosis not present

## 2020-04-28 DIAGNOSIS — M1712 Unilateral primary osteoarthritis, left knee: Secondary | ICD-10-CM | POA: Diagnosis not present

## 2020-04-28 LAB — SARS CORONAVIRUS 2 (TAT 6-24 HRS): SARS Coronavirus 2: NEGATIVE
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
## 2020-05-04 ENCOUNTER — Telehealth: Payer: Medicare HMO | Admitting: Psychiatry

## 2020-05-04 ENCOUNTER — Telehealth: Payer: Self-pay | Admitting: Psychiatry

## 2020-05-04 ENCOUNTER — Other Ambulatory Visit: Payer: Self-pay

## 2020-05-04 NOTE — Telephone Encounter (Signed)
Called the patient  twice for appointment scheduled today. The patient did not answer the phone. Left voice message to contact the office.  

## 2020-05-05 ENCOUNTER — Telehealth (INDEPENDENT_AMBULATORY_CARE_PROVIDER_SITE_OTHER): Payer: Medicare HMO | Admitting: Psychiatry

## 2020-05-05 ENCOUNTER — Encounter: Payer: Self-pay | Admitting: Psychiatry

## 2020-05-05 DIAGNOSIS — F900 Attention-deficit hyperactivity disorder, predominantly inattentive type: Secondary | ICD-10-CM | POA: Diagnosis not present

## 2020-05-05 DIAGNOSIS — F4310 Post-traumatic stress disorder, unspecified: Secondary | ICD-10-CM

## 2020-05-05 DIAGNOSIS — F3342 Major depressive disorder, recurrent, in full remission: Secondary | ICD-10-CM

## 2020-05-05 DIAGNOSIS — F3341 Major depressive disorder, recurrent, in partial remission: Secondary | ICD-10-CM

## 2020-05-05 MED ORDER — ZOLPIDEM TARTRATE ER 6.25 MG PO TBCR: 6.2500 mg | EXTENDED_RELEASE_TABLET | Freq: Every evening | ORAL | 0 refills | Status: DC | PRN

## 2020-05-05 MED ORDER — BUPROPION HCL ER (SR) 100 MG PO TB12: 100.0000 mg | ORAL_TABLET | Freq: Two times a day (BID) | ORAL | 0 refills | Status: DC

## 2020-05-05 MED ORDER — METHYLPHENIDATE HCL 10 MG PO TABS: 10.0000 mg | ORAL_TABLET | Freq: Two times a day (BID) | ORAL | 0 refills | Status: AC

## 2020-05-05 MED ORDER — CLONAZEPAM 0.5 MG PO TABS: 0.5000 mg | ORAL_TABLET | Freq: Three times a day (TID) | ORAL | 0 refills | Status: DC | PRN
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
## 2020-05-08 DIAGNOSIS — F17210 Nicotine dependence, cigarettes, uncomplicated: Secondary | ICD-10-CM | POA: Diagnosis not present

## 2020-05-08 DIAGNOSIS — Z6834 Body mass index (BMI) 34.0-34.9, adult: Secondary | ICD-10-CM | POA: Diagnosis not present

## 2020-05-08 DIAGNOSIS — U071 COVID-19: Secondary | ICD-10-CM | POA: Diagnosis not present

## 2020-05-08 DIAGNOSIS — Z20822 Contact with and (suspected) exposure to covid-19: Secondary | ICD-10-CM | POA: Diagnosis not present

## 2020-05-08 DIAGNOSIS — Z79899 Other long term (current) drug therapy: Secondary | ICD-10-CM | POA: Diagnosis not present

## 2020-05-08 DIAGNOSIS — M546 Pain in thoracic spine: Secondary | ICD-10-CM | POA: Diagnosis not present

## 2020-05-08 DIAGNOSIS — M5416 Radiculopathy, lumbar region: Secondary | ICD-10-CM | POA: Diagnosis not present

## 2020-05-08 DIAGNOSIS — M542 Cervicalgia: Secondary | ICD-10-CM | POA: Diagnosis not present

## 2020-05-08 DIAGNOSIS — F17200 Nicotine dependence, unspecified, uncomplicated: Secondary | ICD-10-CM | POA: Diagnosis not present

## 2020-05-11 ENCOUNTER — Emergency Department: Payer: Medicare HMO

## 2020-05-11 ENCOUNTER — Other Ambulatory Visit: Payer: Self-pay

## 2020-05-11 ENCOUNTER — Emergency Department
Admission: EM | Admit: 2020-05-11 | Discharge: 2020-05-11 | Disposition: A | Discharge status: Home / Self Care | Payer: Medicare HMO | Attending: Student in an Organized Health Care Education/Training Program | Admitting: Student in an Organized Health Care Education/Training Program

## 2020-05-11 DIAGNOSIS — Z87891 Personal history of nicotine dependence: Secondary | ICD-10-CM | POA: Diagnosis not present

## 2020-05-11 DIAGNOSIS — U071 COVID-19: Secondary | ICD-10-CM | POA: Diagnosis not present

## 2020-05-11 DIAGNOSIS — R0602 Shortness of breath: Secondary | ICD-10-CM | POA: Diagnosis not present

## 2020-05-11 DIAGNOSIS — R079 Chest pain, unspecified: Secondary | ICD-10-CM | POA: Diagnosis not present

## 2020-05-11 DIAGNOSIS — I2699 Other pulmonary embolism without acute cor pulmonale: Secondary | ICD-10-CM | POA: Diagnosis not present

## 2020-05-11 DIAGNOSIS — Z7984 Long term (current) use of oral hypoglycemic drugs: Secondary | ICD-10-CM | POA: Insufficient documentation

## 2020-05-11 DIAGNOSIS — J441 Chronic obstructive pulmonary disease with (acute) exacerbation: Secondary | ICD-10-CM

## 2020-05-11 DIAGNOSIS — E1142 Type 2 diabetes mellitus with diabetic polyneuropathy: Secondary | ICD-10-CM | POA: Insufficient documentation

## 2020-05-11 DIAGNOSIS — E039 Hypothyroidism, unspecified: Secondary | ICD-10-CM | POA: Insufficient documentation

## 2020-05-11 DIAGNOSIS — Z79899 Other long term (current) drug therapy: Secondary | ICD-10-CM | POA: Insufficient documentation

## 2020-05-11 DIAGNOSIS — Z8616 Personal history of COVID-19: Secondary | ICD-10-CM | POA: Diagnosis not present

## 2020-05-11 LAB — CBC
HCT: 46.2 % — ABNORMAL HIGH (ref 36.0–46.0)
Hemoglobin: 16 g/dL — ABNORMAL HIGH (ref 12.0–15.0)
MCH: 30.5 pg (ref 26.0–34.0)
MCHC: 34.6 g/dL (ref 30.0–36.0)
MCV: 88.2 fL (ref 80.0–100.0)
Platelets: 357 10*3/uL (ref 150–400)
RBC: 5.24 MIL/uL — ABNORMAL HIGH (ref 3.87–5.11)
RDW: 12.8 % (ref 11.5–15.5)
WBC: 12.6 10*3/uL — ABNORMAL HIGH (ref 4.0–10.5)
nRBC: 0 % (ref 0.0–0.2)

## 2020-05-11 LAB — BASIC METABOLIC PANEL
Anion gap: 13 (ref 5–15)
BUN: 23 mg/dL (ref 8–23)
CO2: 19 mmol/L — ABNORMAL LOW (ref 22–32)
Calcium: 9.2 mg/dL (ref 8.9–10.3)
Chloride: 99 mmol/L (ref 98–111)
Creatinine, Ser: 0.89 mg/dL (ref 0.44–1.00)
GFR, Estimated: >60 mL/min (ref >60)
Glucose, Bld: 262 mg/dL — ABNORMAL HIGH (ref 70–99)
Potassium: 4 mmol/L (ref 3.5–5.1)
Sodium: 131 mmol/L — ABNORMAL LOW (ref 135–145)

## 2020-05-11 LAB — TROPONIN I (HIGH SENSITIVITY)
Troponin I (High Sensitivity): 4 ng/L (ref <18)
Troponin I (High Sensitivity): 4 ng/L (ref <18)

## 2020-05-11 MED ORDER — METHYLPREDNISOLONE SODIUM SUCC 125 MG IJ SOLR
125.0000 mg | Freq: Once | INTRAMUSCULAR | Status: AC
  Administered 2020-05-11: 125 mg via INTRAVENOUS
  Filled 2020-05-11: qty 2

## 2020-05-11 MED ORDER — PREDNISONE 20 MG PO TABS: 40.0000 mg | ORAL_TABLET | Freq: Every day | ORAL | 0 refills | Status: AC

## 2020-05-11 MED ORDER — IPRATROPIUM-ALBUTEROL 0.5-2.5 (3) MG/3ML IN SOLN
3.0000 mL | Freq: Once | RESPIRATORY_TRACT | Status: AC
  Administered 2020-05-11: 3 mL via RESPIRATORY_TRACT
  Filled 2020-05-11: qty 3

## 2020-05-11 MED ORDER — IOHEXOL 350 MG/ML SOLN
75.0000 mL | Freq: Once | INTRAVENOUS | Status: AC | PRN
  Administered 2020-05-11: 75 mL via INTRAVENOUS
  Filled 2020-05-11: qty 75

## 2020-05-11 NOTE — ED Triage Notes (Signed)
Pt comes with c/o CP and SOB. Pt states she jsut feels like she can't catch her breath. Pt states she has COPD. Pt advised by MD to come to ED for check up.  Pt state heavy pressure to chest.

## 2020-05-11 NOTE — ED Provider Notes (Signed)
[facility] Emergency Department Provider Note    Event Date/Time   First MD Initiated Contact with Patient 05/11/20 1521     (approximate)  I have reviewed the triage vital signs and the nursing notes.   HISTORY  Chief Complaint Chest Pain and Shortness of Breath    HPI Tonya H Friend is a 60 y.o. female below listed past medical history presents to the ER for evaluation of shortness of breath developed over the past 3 days.  Feeling lightheaded.  Recently diagnosed with COVID-19.  Will also has a history of COPD bronchitis.  Does feel some wheezing but this feels different.  Has not noted any significant lower extremity swelling.  Denies any chest pain or pressure.  Does have some discomfort with taking deep inspiration.  Is not on anticoagulation.    Past Medical History:  Diagnosis Date  . Anxiety   . Bipolar disorder ([place])   . Complication of anesthesia   . Degenerative disc disease, lumbar   . Depression   . Diabetes mellitus   . Elevated cholesterol   . Fibromyalgia   . GERD (gastroesophageal reflux disease)   . Hypothyroidism   . Migraine   . Neuropathy   . Osteoarthritis   . PONV (postoperative nausea and vomiting)   . PTSD (post-traumatic stress disorder)    after death of son (accidental overdose)  . Thyroid disease    Family History  Problem Relation Age of Onset  . Cancer Other   . Diabetes Other   . Heart disease Other   . Arthritis Other   . Stroke Mother   . Anxiety disorder Mother   . Heart failure Mother   . Depression Mother   . Cancer - Lung Father   . Lung disease Father   . Colon cancer Father        father, diagnosed in his late 22s   . Heart failure Sister   . Depression Sister   . Alcohol abuse Brother   . Lung disease Other   . Heart disease Maternal Grandfather   . Stroke Paternal Grandmother   . Drug abuse Other   . Drug abuse Son   . Drug abuse Son    Past Surgical History:  Procedure Laterality  Date  . BILATERAL OOPHORECTOMY    . CARPAL TUNNEL RELEASE  bilateral  . CHOLECYSTECTOMY    . COLONOSCOPY WITH PROPOFOL N/A 05/01/2012   DJM:EQAST COLON polyps/Mild diverticulosis was noted in the sigmoid colon/Moderate sized internal hemorrhoids. path with tubular adenoma  . gallbladder    . KNEE ARTHROPLASTY Right 08/28/2017   Procedure: COMPUTER ASSISTED TOTAL KNEE ARTHROPLASTY;  Surgeon: Dereck Leep, MD;  Location: [facility] ORS;  Service: Orthopedics;  Laterality: Right;  . KNEE ARTHROSCOPY     bilateral  . KNEE ARTHROSCOPY Left 02/06/2019   Procedure: LEFT KNEE ARTHROSCOPY WITH  PARTIAL MEDIAL MENISECTOMY WITH MEDIAL CHONDRALPLASTY;  Surgeon: Dereck Leep, MD;  Location: [facility] ORS;  Service: Orthopedics;  Laterality: Left;  . KNEE ARTHROSCOPY WITH MEDIAL MENISECTOMY Right 10/29/2015   Procedure: ARTHROSCOPY RIGHT KNEE  WITH PARTIAL MEDIAL MENISECTOMY;  Surgeon: Carole Civil, MD;  Location: [facility];  Service: Orthopedics;  Laterality: Right;  . KYPHOPLASTY N/A 02/18/2020   Procedure: L1, L2 Kyphoplasty;  Surgeon: Hessie Knows, MD;  Location: [facility] ORS;  Service: Orthopedics;  Laterality: N/A;  . POLYPECTOMY N/A 05/01/2012   Procedure: POLYPECTOMY;  Surgeon: Danie Binder, MD;  Location: [facility] OR[facility];  Service: Endoscopy;  Laterality: N/A;  . SHOULDER SURGERY  left  . VAGINAL HYSTERECTOMY     Patient Active Problem List   Diagnosis Date Noted  . Gait abnormality 11/18/2019  . Chronic low back pain with left-sided sciatica 11/18/2019  . Polypharmacy 11/18/2019  . MDD (major depressive disorder), recurrent, in partial remission ([place]) 04/25/2019  . Attention deficit hyperactivity disorder (ADHD), predominantly inattentive type 04/25/2019  . Paresthesia 01/28/2019  . Peripheral neuropathy 09/18/2018  . MDD (major depressive disorder), recurrent episode, mild ([place]) 07/18/2018  . Diarrhea 04/04/2018  . Status post total right knee replacement 08/28/2017  . Mood disorder in conditions  classified elsewhere 04/03/2017  . 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 Palpitations 06/01/2016  . Hyperlipidemia 06/01/2016  . Tobacco abuse 06/01/2016  . Dyspnea on exertion 05/16/2016  . Left leg swelling 05/16/2016  . Diabetes mellitus ([place]) 05/16/2016  . Chest pain   . Swelling   . Acute medial meniscus tear of left knee   . Post traumatic stress disorder (PTSD) 11/08/2014  . MDD (major depressive disorder), recurrent severe, without psychosis ([place]) 11/08/2014  . 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 Panic disorder 11/08/2014  . Chronic pain syndrome 11/21/2013  . Primary osteoarthritis of both knees 11/21/2013  . Spinal stenosis of lumbar region 11/21/2013  . Difficulty in walking(719.7) 10/14/2013  . Sciatica associated with disorder of lumbar spine 09/17/2013  . Lower back pain 08/08/2013  . Lateral meniscus, posterior horn derangement 06/13/2013  . Arthritis of right knee 06/13/2013  . S/P medial meniscectomy of right knee 06/13/2013  . Left shoulder pain 08/28/2012  . Rotator cuff syndrome of left shoulder 08/28/2012  . Rotator cuff tear 08/28/2012  . Pes anserinus bursitis 05/16/2012  . Encounter for screening colonoscopy 04/16/2012  . Fatty liver 04/16/2012  . Back pain 07/26/2011  . Knee bursitis, left 07/26/2011  . OA (osteoarthritis) of knee 07/26/2011  . Spinal stenosis 10/28/2010  . Medial meniscus, posterior horn derangement 08/12/2010  . Knee pain 08/04/2010  . Sciatica 08/04/2010  . H N P-LUMBAR 01/14/2010  . 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 LUMBOSACRAL STRAIN 05/12/2009  . DERANGEMENT OF POSTERIOR HORN OF MEDIAL MENISCUS 01/19/2009  . BACK PAIN 01/19/2009  . LOWER LEG, ARTHRITIS, DEGEN./OSTEO 12/02/2008  . JOINT EFFUSION, KNEE 11/10/2008  . ANSERINE BURSITIS, RIGHT 11/10/2008  . DERANGEMENT MENISCUS 10/15/2008  . KNEE PAIN 10/15/2008      Prior to Admission medications   Medication Sig Start  Date End Date Taking? Authorizing Provider  predniSONE (DELTASONE) 20 MG tablet Take 2 tablets (40 mg total) by mouth daily for 5 days. 05/11/20 05/16/20 Yes Merlyn Lot, MD  aspirin EC 81 MG tablet Take 81 mg by mouth daily.     [provider]  azithromycin (ZITHROMAX Z-PAK) 250 MG tablet Take 2 tablets (500 mg) on  Day 1,  followed by 1 tablet (250 mg) once daily on Days 2 through 5. 04/27/20   Duffy Bruce, MD  benzonatate (TESSALON PERLES) 100 MG capsule Take 1 capsule (100 mg total) by mouth 3 (three) times daily as needed for cough. 04/27/20 04/27/21  Duffy Bruce, MD  buPROPion (WELLBUTRIN SR) 100 MG 12 hr tablet Take 1 tablet (100 mg total) by mouth 2 (two) times daily. 05/05/20   Norman Clay, MD  calcium-vitamin D (OSCAL WITH D) 500-200 MG-UNIT tablet Take 2 tablets by mouth daily with breakfast.    [provider]  canagliflozin (INVOKANA) 300 MG TABS tablet Take 300 mg by mouth daily. 11/10/14  Niel Hummer, NP  clonazePAM (KLONOPIN) 0.5 MG tablet Take 1 tablet (0.5 mg total) by mouth 3 (three) times daily as needed for anxiety. 05/20/20   Norman Clay, MD  cycloSPORINE (RESTASIS) 0.05 % ophthalmic emulsion Place 1 drop into both eyes 2 (two) times daily.    [provider]  Dulaglutide (TRULICITY) 1.5 NW/2.9FA SOPN Inject 1.5 mg into the skin every Saturday.     [provider]  DULoxetine (CYMBALTA) 60 MG capsule Take 1 capsule (60 mg total) by mouth at bedtime. 11/18/19   Marcial Pacas, MD  eszopiclone (LUNESTA) 2 MG TABS tablet Take 1 tablet (2 mg total) by mouth at bedtime as needed for sleep. Take immediately before bedtime 03/02/20   Norman Clay, MD  fluticasone (FLONASE) 50 MCG/ACT nasal spray Place 1 spray into both nostrils daily as needed for allergies or rhinitis.    [provider]  glipiZIDE (GLUCOTROL XL) 5 MG 24 hr tablet Take 5 mg by mouth daily with breakfast.    [provider]  ibuprofen (ADVIL) 200 MG tablet  Take 400 mg by mouth every 6 (six) hours as needed for mild pain.    [provider]  levocetirizine (XYZAL) 5 MG tablet Take 5 mg by mouth daily as needed for allergies.  09/11/18   [provider]  levothyroxine (SYNTHROID, LEVOTHROID) 150 MCG tablet Take 150 mcg by mouth daily before breakfast.    [provider]  meloxicam (MOBIC) 15 MG tablet Take 1 tablet (15 mg total) by mouth daily. 04/20/20 04/20/21  Fisher, Linden Dolin, PA-C  metFORMIN (GLUCOPHAGE-XR) 500 MG 24 hr tablet Take 1,000 mg by mouth in the morning and at bedtime.    [provider]  methocarbamol (ROBAXIN) 750 MG tablet Take 750 mg by mouth every 8 (eight) hours as needed for muscle spasms. 01/29/20   [provider]  methylphenidate (RITALIN) 10 MG tablet Take 1 tablet (10 mg total) by mouth 2 (two) times daily. 05/05/20   Norman Clay, MD  NARCAN 4 MG/0.1ML LIQD nasal spray kit Place 1 spray into the nose as needed. Overdose 01/29/20   [provider]  oxyCODONE-acetaminophen (PERCOCET) 10-325 MG tablet Take 1 tablet by mouth 4 (four) times daily as needed for pain. 01/29/20   [provider]  pantoprazole (PROTONIX) 40 MG tablet Take 40 mg by mouth daily before breakfast.  06/29/17   [provider]  pregabalin (LYRICA) 200 MG capsule Take 1 capsule (200 mg total) by mouth 3 (three) times daily. 11/18/19   Marcial Pacas, MD  simvastatin (ZOCOR) 40 MG tablet Take 1 tablet (40 mg total) by mouth daily. 11/10/14   Niel Hummer, NP  SUMAtriptan (IMITREX) 100 MG tablet One tablet po prn migraine.  May repeat in 2 hrs if needed.  Do not exceed 200 mg per 24 hrs. Patient taking differently: Take 100 mg by mouth every 2 (two) hours as needed for migraine. One tablet po prn migraine.  May repeat in 2 hrs if needed.  Do not exceed 200 mg per 24 hrs. 06/26/12   Triplett, Tammy, PA-C  tobramycin (TOBREX) 0.3 % ophthalmic solution Place 2 drops into both eyes every 4 (four) hours.  04/20/20   Fisher, Linden Dolin, PA-C  zolpidem (AMBIEN CR) 6.25 MG CR tablet Take 1 tablet (6.25 mg total) by mouth at bedtime as needed for sleep. 05/05/20   Norman Clay, MD    Allergies Patient has no known allergies.    Social History  Social History   Tobacco Use  . Smoking status: Former Smoker    Packs/day: 1.00    Years: 13.00    Pack years: 13.00    Types: Cigarettes    Quit date: 02/06/2019    Years since quitting: 1.2  . Smokeless tobacco: Never Used  Vaping Use  . Vaping Use: Never used  Substance Use Topics  . Alcohol use: No  . Drug use: No    Review of Systems Patient denies headaches, rhinorrhea, blurry vision, numbness, shortness of breath, chest pain, edema, cough, abdominal pain, nausea, vomiting, diarrhea, dysuria, fevers, rashes or hallucinations unless otherwise stated above in HPI. ____________________________________________   PHYSICAL EXAM:  VITAL SIGNS: Vitals:   05/11/20 1543 05/11/20 1710  BP: 125/85   Pulse: 80   Resp: 20   Temp:    SpO2: 93% 98%    Constitutional: Alert and oriented.  Eyes: Conjunctivae are normal.  Head: Atraumatic. Nose: No congestion/rhinnorhea. Mouth/Throat: Mucous membranes are moist.   Neck: No stridor. Painless ROM.  Cardiovascular: Normal rate, regular rhythm. Grossly normal heart sounds.  Good peripheral circulation. Respiratory: mild tachypnea with scattered coarse wheeze throughout. Gastrointestinal: Soft and nontender. No distention. No abdominal bruits. No CVA tenderness. Genitourinary:  Musculoskeletal: No lower extremity tenderness nor edema.  No joint effusions. Neurologic:  Normal speech and language. No gross focal neurologic deficits are appreciated. No facial droop Skin:  Skin is warm, dry and intact. No rash noted. Psychiatric: Mood and affect are normal. Speech and behavior are normal.  ____________________________________________   LABS (all labs ordered are listed, but only abnormal results  are displayed)  Results for orders placed or performed during the hospital encounter of 05/11/20 (from the past 24 hour(s))  Basic metabolic panel     Status: Abnormal   Collection Time: 05/11/20  1:37 PM  Result Value Ref Range   Sodium 131 (L) 135 - 145 mmol/L   Potassium 4.0 3.5 - 5.1 mmol/L   Chloride 99 98 - 111 mmol/L   CO2 19 (L) 22 - 32 mmol/L   Glucose, Bld 262 (H) 70 - 99 mg/dL   BUN 23 8 - 23 mg/dL   Creatinine, Ser 0.89 0.44 - 1.00 mg/dL   Calcium 9.2 8.9 - 10.3 mg/dL   GFR, Estimated >60 >60 mL/min   Anion gap 13 5 - 15  CBC     Status: Abnormal   Collection Time: 05/11/20  1:37 PM  Result Value Ref Range   WBC 12.6 (H) 4.0 - 10.5 K/uL   RBC 5.24 (H) 3.87 - 5.11 MIL/uL   Hemoglobin 16.0 (H) 12.0 - 15.0 g/dL   HCT 46.2 (H) 36.0 - 46.0 %   MCV 88.2 80.0 - 100.0 fL   MCH 30.5 26.0 - 34.0 pg   MCHC 34.6 30.0 - 36.0 g/dL   RDW 12.8 11.5 - 15.5 %   Platelets 357 150 - 400 K/uL   nRBC 0.0 0.0 - 0.2 %  Troponin I (High Sensitivity)     Status: None   Collection Time: 05/11/20  1:37 PM  Result Value Ref Range   Troponin I (High Sensitivity) 4 <18 ng/L  Troponin I (High Sensitivity)     Status: None   Collection Time: 05/11/20  3:43 PM  Result Value Ref Range   Troponin I (High Sensitivity) 4 <18 ng/L   ____________________________________________  EKG My review and personal interpretation at Time: 13:25   Indication: sob  Rate: 95  Rhythm: sinus Axis: normal Other:  normal intervals, no stemi ____________________________________________  RADIOLOGY  I personally reviewed all radiographic images ordered to evaluate for the above acute complaints and reviewed radiology reports and findings.  These findings were personally discussed with the patient.  Please see medical record for radiology report.  ____________________________________________   PROCEDURES  Procedure(s) performed:  Procedures    Critical Care performed:  no ____________________________________________   INITIAL IMPRESSION / ASSESSMENT AND PLAN / ED COURSE  Pertinent labs & imaging results that were available during my care of the patient were reviewed by me and considered in my medical decision making (see chart for details).   DDX: Asthma, copd, CHF, pna, ptx, malignancy, Pe, anemia   [name] is a 60 y.o. who presents to the ED with presentation as described above.  Patient nontoxic-appearing does have some wheezing on exam.  Given her post Covid state with deterioration of the past 3 days will order CTA to rule out PE.  Will give nebs as well as steroids.  Have low suspicion for ACS or CHF.  Clinical Course as of 05/11/20 1724  Mon May 11, 2020  1613 My review of CTA I do not see any evidence of pulmonary embolism.  I suspect this is more likely secondary to underlying COPD.  We will continue to observe [PR]  1633 Patient reassessed and feels improved.  Wheezing resolved after nebulizer.  CTA confirmed no evidence of PE.  No sign of edema or pneumonia.  Will ambulate to make sure the patient's not hypoxic and if not anticipate patient will be appropriate for outpatient follow-up. [PR]  1724 Patient feels significantly improved.  She is able to ambulate with steady gait no hypoxia symptoms resolving.  She has albuterol inhalers at home.  Will prescribe prescription for prednisone.  Discussed strict return precautions.  Patient agreeable to plan. [PR]    Clinical Course User Index [PR] Merlyn Lot, MD    The patient was evaluated in Emergency Department today for the symptoms described in the history of present illness. He/she was evaluated in the context of the global COVID-19 pandemic, which necessitated consideration that the patient might be at risk for infection with the SARS-CoV-2 virus that causes COVID-19. Institutional protocols and algorithms that pertain to the evaluation of patients at risk for COVID-19 are in a  state of rapid change based on information released by regulatory bodies including the CDC and federal and state organizations. These policies and algorithms were followed during the patient's care in the ED.  As part of my medical decision making, I reviewed the following data within the [place] notes reviewed and incorporated, Labs reviewed, notes from prior ED visits and [place] Controlled Substance Database   ____________________________________________   FINAL CLINICAL IMPRESSION(S) / ED DIAGNOSES  Final diagnoses:  COPD exacerbation ([place])      NEW MEDICATIONS STARTED DURING THIS VISIT:  New Prescriptions   PREDNISONE (DELTASONE) 20 MG TABLET    Take 2 tablets (40 mg total) by mouth daily for 5 days.     Note:  This document was prepared using Dragon voice recognition software and may include unintentional dictation errors.    Merlyn Lot, MD 05/11/20 [phone]

## 2020-05-11 NOTE — ED Notes (Addendum)
oxygen sats 98% room air ambulating.   States I feel better after breathing treatments   md aware
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
## 2020-05-21 ENCOUNTER — Other Ambulatory Visit: Payer: Self-pay | Admitting: Neurology

## 2020-05-26 DIAGNOSIS — E1151 Type 2 diabetes mellitus with diabetic peripheral angiopathy without gangrene: Secondary | ICD-10-CM | POA: Diagnosis not present

## 2020-05-26 DIAGNOSIS — M1712 Unilateral primary osteoarthritis, left knee: Secondary | ICD-10-CM | POA: Diagnosis not present

## 2020-06-02 ENCOUNTER — Other Ambulatory Visit: Payer: Self-pay

## 2020-06-02 ENCOUNTER — Encounter: Payer: Self-pay | Admitting: Psychiatry

## 2020-06-02 ENCOUNTER — Telehealth (INDEPENDENT_AMBULATORY_CARE_PROVIDER_SITE_OTHER): Payer: Medicare HMO | Admitting: Psychiatry

## 2020-06-02 DIAGNOSIS — F4310 Post-traumatic stress disorder, unspecified: Secondary | ICD-10-CM | POA: Diagnosis not present

## 2020-06-02 DIAGNOSIS — F331 Major depressive disorder, recurrent, moderate: Secondary | ICD-10-CM | POA: Diagnosis not present

## 2020-06-02 DIAGNOSIS — G4700 Insomnia, unspecified: Secondary | ICD-10-CM | POA: Diagnosis not present

## 2020-06-02 DIAGNOSIS — F900 Attention-deficit hyperactivity disorder, predominantly inattentive type: Secondary | ICD-10-CM | POA: Diagnosis not present

## 2020-06-02 MED ORDER — BUPROPION HCL ER (SR) 150 MG PO TB12: 150.0000 mg | ORAL_TABLET | Freq: Two times a day (BID) | ORAL | 0 refills | Status: DC

## 2020-06-02 MED ORDER — ZOLPIDEM TARTRATE ER 6.25 MG PO TBCR: 6.2500 mg | EXTENDED_RELEASE_TABLET | Freq: Every evening | ORAL | 0 refills | Status: DC | PRN

## 2020-06-02 MED ORDER — CLONAZEPAM 0.5 MG PO TABS: 0.5000 mg | ORAL_TABLET | Freq: Three times a day (TID) | ORAL | 2 refills | Status: DC | PRN

## 2020-06-02 MED ORDER — METHYLPHENIDATE HCL 10 MG PO TABS: 10.0000 mg | ORAL_TABLET | Freq: Two times a day (BID) | ORAL | 0 refills | Status: AC

## 2020-06-08 DIAGNOSIS — M1712 Unilateral primary osteoarthritis, left knee: Secondary | ICD-10-CM | POA: Diagnosis not present

## 2020-06-09 DIAGNOSIS — M546 Pain in thoracic spine: Secondary | ICD-10-CM | POA: Diagnosis not present

## 2020-06-09 DIAGNOSIS — M542 Cervicalgia: Secondary | ICD-10-CM | POA: Diagnosis not present

## 2020-06-09 DIAGNOSIS — F17210 Nicotine dependence, cigarettes, uncomplicated: Secondary | ICD-10-CM | POA: Diagnosis not present

## 2020-06-09 DIAGNOSIS — Z6833 Body mass index (BMI) 33.0-33.9, adult: Secondary | ICD-10-CM | POA: Diagnosis not present

## 2020-06-09 DIAGNOSIS — Z79899 Other long term (current) drug therapy: Secondary | ICD-10-CM | POA: Diagnosis not present

## 2020-06-09 DIAGNOSIS — M5416 Radiculopathy, lumbar region: Secondary | ICD-10-CM | POA: Diagnosis not present

## 2020-06-09 DIAGNOSIS — F17200 Nicotine dependence, unspecified, uncomplicated: Secondary | ICD-10-CM | POA: Diagnosis not present

## 2020-06-23 ENCOUNTER — Other Ambulatory Visit: Payer: Self-pay

## 2020-06-23 ENCOUNTER — Encounter (HOSPITAL_COMMUNITY): Payer: Self-pay | Admitting: Emergency Medicine

## 2020-06-23 ENCOUNTER — Ambulatory Visit (HOSPITAL_COMMUNITY)
Admission: EM | Admit: 2020-06-23 | Discharge: 2020-06-23 | Disposition: A | Discharge status: Home / Self Care | Payer: Medicare HMO | Attending: Emergency Medicine | Admitting: Emergency Medicine

## 2020-06-23 DIAGNOSIS — R600 Localized edema: Secondary | ICD-10-CM

## 2020-06-23 MED ORDER — FUROSEMIDE 10 MG/ML IJ SOLN
20.0000 mg | Freq: Once | INTRAMUSCULAR | Status: AC
  Administered 2020-06-23: 20 mg via INTRAMUSCULAR

## 2020-06-23 MED ORDER — FUROSEMIDE 10 MG/ML IJ SOLN
INTRAMUSCULAR | Status: AC
  Filled 2020-06-23: qty 2

## 2020-06-23 NOTE — ED Provider Notes (Addendum)
[facility]    CSN: [id number] Arrival date & time: 06/23/20  1403      History   Chief Complaint Chief Complaint  Patient presents with  . Leg Swelling    bilateral    HPI [name] is a 60 y.o. female.   Patient presents with bilateral leg swelling starting three days ago worsening at nighttime. Started in feet and moved up legs. Skin feels tight around ankles. One episode of shortness of breath, relieved with an inhaler. Denies chest pain, chest tightness, wheezing, leg pain, redness, bruising, warmth of skin. No cardiac history. History of COPD. Walks with cane at baseline. No recent changes in medications. No changes in diet. Endorses being in a car for more than two hours yesterday but swelling already present.   Patient concerned with redness of right foot toes and red spots in inner foot. Became present after swelling began. Denies itching, drainage, spreading of rash. Has not change hygiene product, toiletries, sheets, lotions. No changes in diet. Denies abrasions and opening on skin.   Past Medical History:  Diagnosis Date  . Anxiety   . Bipolar disorder ( [place])   . Complication of anesthesia   . Degenerative disc disease, lumbar   . Depression   . Diabetes mellitus   . Elevated cholesterol   . Fibromyalgia   . GERD (gastroesophageal reflux disease)   . Hypothyroidism   . Migraine   . Neuropathy   . Osteoarthritis   . PONV (postoperative nausea and vomiting)   . PTSD (post-traumatic stress disorder)    after death of son (accidental overdose)  . Thyroid disease     Patient Active Problem List   Diagnosis Date Noted  . Gait abnormality 11/18/2019  . Chronic low back pain with left-sided sciatica 11/18/2019  . Polypharmacy 11/18/2019  . MDD (major depressive disorder), recurrent, in partial remission ([place]) 04/25/2019  . Attention deficit hyperactivity disorder (ADHD), predominantly inattentive type 04/25/2019  . Paresthesia 01/28/2019  .  Peripheral neuropathy 09/18/2018  . MDD (major depressive disorder), recurrent episode, mild ([place]) 07/18/2018  . Diarrhea 04/04/2018  . Status post total right knee replacement 08/28/2017  . 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 Palpitations 06/01/2016  . Hyperlipidemia 06/01/2016  . Tobacco abuse 06/01/2016  . Dyspnea on exertion 05/16/2016  . Left leg swelling 05/16/2016  . Diabetes mellitus ([place]) 05/16/2016  . Chest pain   . Swelling   . Acute medial meniscus tear of left knee   . Post traumatic stress disorder (PTSD) 11/08/2014  . MDD (major depressive disorder), recurrent severe, without psychosis ([place]) 11/08/2014  . 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 Panic disorder 11/08/2014  . Chronic pain syndrome 11/21/2013  . Primary osteoarthritis of both knees 11/21/2013  . Spinal stenosis of lumbar region 11/21/2013  . Difficulty in walking(719.7) 10/14/2013  . Sciatica associated with disorder of lumbar spine 09/17/2013  . Lower back pain 08/08/2013  . Lateral meniscus, posterior horn derangement 06/13/2013  . Arthritis of right knee 06/13/2013  . S/P medial meniscectomy of right knee 06/13/2013  . Left shoulder pain 08/28/2012  . Rotator cuff syndrome of left shoulder 08/28/2012  . Rotator cuff tear 08/28/2012  . Pes anserinus bursitis 05/16/2012  . Encounter for screening colonoscopy 04/16/2012  . Fatty liver 04/16/2012  . Back pain 07/26/2011  . Knee bursitis, left 07/26/2011  . OA (osteoarthritis) of knee 07/26/2011  . Spinal stenosis 10/28/2010  . Medial  meniscus, posterior horn derangement 08/12/2010  . Knee pain 08/04/2010  . Sciatica 08/04/2010  . H N P-LUMBAR 01/14/2010  . 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 LUMBOSACRAL STRAIN 05/12/2009  . DERANGEMENT OF POSTERIOR HORN OF MEDIAL MENISCUS 01/19/2009  . BACK PAIN 01/19/2009  . LOWER LEG, ARTHRITIS,  DEGEN./OSTEO 12/02/2008  . JOINT EFFUSION, KNEE 11/10/2008  . ANSERINE BURSITIS, RIGHT 11/10/2008  . DERANGEMENT MENISCUS 10/15/2008  . KNEE PAIN 10/15/2008    Past Surgical History:  Procedure Laterality Date  . BILATERAL OOPHORECTOMY    . CARPAL TUNNEL RELEASE  bilateral  . CHOLECYSTECTOMY    . COLONOSCOPY WITH PROPOFOL N/A 05/01/2012   [facility] COLON polyps/Mild diverticulosis was noted in the sigmoid colon/Moderate sized internal hemorrhoids. path with tubular adenoma  . gallbladder    . KNEE ARTHROPLASTY Right 08/28/2017   Procedure: COMPUTER ASSISTED TOTAL KNEE ARTHROPLASTY;  Surgeon: Dereck Leep, MD;  Location: [facility];  Service: Orthopedics;  Laterality: Right;  . KNEE ARTHROSCOPY     bilateral  . KNEE ARTHROSCOPY Left 02/06/2019   Procedure: LEFT KNEE ARTHROSCOPY WITH  PARTIAL MEDIAL MENISECTOMY WITH MEDIAL CHONDRALPLASTY;  Surgeon: Dereck Leep, MD;  Location: [facility];  Service: Orthopedics;  Laterality: Left;  . KNEE ARTHROSCOPY WITH MEDIAL MENISECTOMY Right 10/29/2015   Procedure: ARTHROSCOPY RIGHT KNEE  WITH PARTIAL MEDIAL MENISECTOMY;  Surgeon: Carole Civil, MD;  Location: [facility];  Service: Orthopedics;  Laterality: Right;  . KYPHOPLASTY N/A 02/18/2020   Procedure: L1, L2 Kyphoplasty;  Surgeon: Hessie Knows, MD;  Location: [facility];  Service: Orthopedics;  Laterality: N/A;  . POLYPECTOMY N/A 05/01/2012   Procedure: POLYPECTOMY;  Surgeon: Danie Binder, MD;  Location: [facility];  Service: Endoscopy;  Laterality: N/A;  . SHOULDER SURGERY  left  . VAGINAL HYSTERECTOMY      OB History   No obstetric history on file.      Home Medications    Prior to Admission medications   Medication Sig Start Date End Date Taking? Authorizing Provider  aspirin EC 81 MG tablet Take 81 mg by mouth daily.    Yes [provider]  buPROPion (WELLBUTRIN SR) 100 MG 12 hr tablet Take 1 tablet (100 mg total) by mouth 2 (two) times daily. 05/05/20  Yes Norman Clay, MD   buPROPion (WELLBUTRIN SR) 150 MG 12 hr tablet Take 1 tablet (150 mg total) by mouth 2 (two) times daily. 06/02/20  Yes Hisada, Elie Goody, MD  canagliflozin (INVOKANA) 300 MG TABS tablet Take 300 mg by mouth daily. 11/10/14  Yes Niel Hummer, NP  clonazePAM (KLONOPIN) 0.5 MG tablet Take 1 tablet (0.5 mg total) by mouth 3 (three) times daily as needed for anxiety. 06/19/20 09/17/20 Yes Hisada, Elie Goody, MD  cycloSPORINE (RESTASIS) 0.05 % ophthalmic emulsion Place 1 drop into both eyes 2 (two) times daily.   Yes [provider]  Dulaglutide 1.5 MG/0.5ML SOPN Inject 1.5 mg into the skin every Saturday.    Yes [provider]  DULoxetine (CYMBALTA) 60 MG capsule Take 1 capsule (60 mg total) by mouth at bedtime. 11/18/19  Yes Marcial Pacas, MD  fluticasone (FLONASE) 50 MCG/ACT nasal spray Place 1 spray into both nostrils daily as needed for allergies or rhinitis.   Yes [provider]  glipiZIDE (GLUCOTROL XL) 5 MG 24 hr tablet Take 5 mg by mouth daily with breakfast.   Yes [provider]  ibuprofen (ADVIL) 200 MG tablet Take 400 mg by mouth every 6 (six) hours as needed for  mild pain.   Yes [provider]  levocetirizine (XYZAL) 5 MG tablet Take 5 mg by mouth daily as needed for allergies.  09/11/18  Yes [provider]  levothyroxine (SYNTHROID, LEVOTHROID) 150 MCG tablet Take 150 mcg by mouth daily before breakfast.   Yes [provider]  metFORMIN (GLUCOPHAGE-XR) 500 MG 24 hr tablet Take 1,000 mg by mouth in the morning and at bedtime.   Yes [provider]  methocarbamol (ROBAXIN) 750 MG tablet Take 750 mg by mouth every 8 (eight) hours as needed for muscle spasms. 01/29/20  Yes [provider]  methylphenidate (RITALIN) 10 MG tablet Take 1 tablet (10 mg total) by mouth 2 (two) times daily. 05/05/20  Yes Hisada, Elie Goody, MD  oxyCODONE-acetaminophen (PERCOCET) 10-325 MG tablet Take 1 tablet by mouth 4 (four) times daily as needed for  pain. 01/29/20  Yes [provider]  pantoprazole (PROTONIX) 40 MG tablet Take 40 mg by mouth daily before breakfast. 06/29/17  Yes [provider]  pregabalin (LYRICA) 200 MG capsule Take 1 capsule (200 mg total) by mouth 3 (three) times daily. Please request future refills from PCP. 05/21/20  Yes Marcial Pacas, MD  zolpidem (AMBIEN CR) 6.25 MG CR tablet Take 1 tablet (6.25 mg total) by mouth at bedtime as needed for sleep. 06/02/20  Yes Hisada, Elie Goody, MD  azithromycin (ZITHROMAX Z-PAK) 250 MG tablet Take 2 tablets (500 mg) on  Day 1,  followed by 1 tablet (250 mg) once daily on Days 2 through 5. 04/27/20   Duffy Bruce, MD  benzonatate (TESSALON PERLES) 100 MG capsule Take 1 capsule (100 mg total) by mouth 3 (three) times daily as needed for cough. 04/27/20 04/27/21  Duffy Bruce, MD  calcium-vitamin D (OSCAL WITH D) 500-200 MG-UNIT tablet Take 2 tablets by mouth daily with breakfast.    [provider]  eszopiclone (LUNESTA) 2 MG TABS tablet Take 1 tablet (2 mg total) by mouth at bedtime as needed for sleep. Take immediately before bedtime 03/02/20   Norman Clay, MD  meloxicam (MOBIC) 15 MG tablet Take 1 tablet (15 mg total) by mouth daily. 04/20/20 04/20/21  Fisher, Linden Dolin, PA-C  methylphenidate (RITALIN) 10 MG tablet Take 1 tablet (10 mg total) by mouth 2 (two) times daily. 06/02/20   Norman Clay, MD  NARCAN 4 MG/0.1ML LIQD nasal spray kit Place 1 spray into the nose as needed. Overdose 01/29/20   [provider]  simvastatin (ZOCOR) 40 MG tablet Take 1 tablet (40 mg total) by mouth daily. 11/10/14   Niel Hummer, NP  SUMAtriptan (IMITREX) 100 MG tablet One tablet po prn migraine.  May repeat in 2 hrs if needed.  Do not exceed 200 mg per 24 hrs. Patient taking differently: Take 100 mg by mouth every 2 (two) hours as needed for migraine. One tablet po prn migraine.  May repeat in 2 hrs if needed.  Do not exceed 200 mg per 24 hrs. 06/26/12   Triplett, Tammy, PA-C   tobramycin (TOBREX) 0.3 % ophthalmic solution Place 2 drops into both eyes every 4 (four) hours. 04/20/20   Caryn Section Linden Dolin, PA-C    Family History Family History  Problem Relation Age of Onset  . Cancer Other   . Diabetes Other   . Heart disease Other   . Arthritis Other   . Stroke Mother   . Anxiety disorder Mother   . Heart failure Mother   . Depression Mother   . Cancer - Lung Father   .  Lung disease Father   . Colon cancer Father        father, diagnosed in his late 52s   . Heart failure Sister   . Depression Sister   . Alcohol abuse Brother   . Lung disease Other   . Heart disease Maternal Grandfather   . Stroke Paternal Grandmother   . Drug abuse Other   . Drug abuse Son   . Drug abuse Son     Social History Social History   Tobacco Use  . Smoking status: Former Smoker    Packs/day: 1.00    Years: 13.00    Pack years: 13.00    Types: Cigarettes    Quit date: 02/06/2019    Years since quitting: 1.3  . Smokeless tobacco: Never Used  Vaping Use  . Vaping Use: Never used  Substance Use Topics  . Alcohol use: No  . Drug use: No     Allergies   Patient has no known allergies.   Review of Systems Review of Systems  Constitutional: Negative.   HENT: Negative.   Respiratory: Negative.   Cardiovascular: Positive for leg swelling. Negative for chest pain and palpitations.  Gastrointestinal: Negative.   Genitourinary: Negative.   Skin: Negative.   Neurological: Negative.      Physical Exam Triage Vital Signs ED Triage Vitals  Enc Vitals Group     BP 06/23/20 1504 123/62     Pulse Rate 06/23/20 1504 78     Resp 06/23/20 1504 20     Temp 06/23/20 1504 98 F (36.7 C)     Temp Source 06/23/20 1504 Oral     SpO2 06/23/20 1504 94 %     Weight 06/23/20 1458 205 lb (93 kg)     Height 06/23/20 1458 '5\' 6"'  (1.676 m)     Head Circumference --      Peak Flow --      Pain Score 06/23/20 1458 7     Pain Loc --      Pain Edu? --      Excl. in [place]? --     No data found.  Updated Vital Signs BP 123/62 (BP Location: Right Arm)   Pulse 78   Temp 98 F (36.7 C) (Oral)   Resp 20   Ht '5\' 6"'  (1.676 m)   Wt 205 lb (93 kg)   SpO2 94%   BMI 33.09 kg/m   Visual Acuity Right Eye Distance:   Left Eye Distance:   Bilateral Distance:    Right Eye Near:   Left Eye Near:    Bilateral Near:     Physical Exam Constitutional:      Appearance: Normal appearance. She is normal weight.  HENT:     Head: Normocephalic.  Eyes:     Extraocular Movements: Extraocular movements intact.  Pulmonary:     Effort: Pulmonary effort is normal.  Musculoskeletal:     Right lower leg: Edema present.     Left lower leg: Edema present.     Right ankle: No swelling or ecchymosis. No tenderness. Normal range of motion.     Right Achilles Tendon: Normal.     Left ankle: No swelling or ecchymosis. No tenderness. Normal range of motion.     Left Achilles Tendon: Normal.     Right foot: Normal range of motion. Swelling present. No tenderness.     Left foot: Normal range of motion. Swelling present. No tenderness.     Comments: Edema in bilateral legs  is nonpitting   Skin:    General: Skin is warm and dry.          Comments: Red Macular spots on medial right foot, no warmth, non tender, no drainage present or able to expelled,   Neurological:     General: No focal deficit present.     Mental Status: She is alert and oriented to person, place, and time. Mental status is at baseline.  Psychiatric:        Mood and Affect: Mood normal.        Thought Content: Thought content normal.        Judgment: Judgment normal.      UC Treatments / Results  Labs (all labs ordered are listed, but only abnormal results are displayed) Labs Reviewed  POCT URINALYSIS DIPSTICK, ED / UC    EKG   Radiology No results found.  Procedures Procedures (including critical care time)  Medications Ordered in UC Medications  furosemide (LASIX) injection 20 mg (has no  administration in time range)    Initial Impression / Assessment and Plan / UC Course  I have reviewed the triage vital signs and the nursing notes.  Pertinent labs & imaging results that were available during my care of the patient were reviewed by me and considered in my medical decision making (see chart for details).  Bilateral lower extremity edema  1. Furosemide 20 mg once 2. Compression stockings 3-4 hours then can take break, repeat 3. Elevate legs while in sitting and lying position 4. Follow up with PCP within 1 week  5. Monitor rash on foot, follow up with PCP if still present, possibly related to edema and skin tightening since presenting afterwards  Final Clinical Impressions(s) / UC Diagnoses      Hans Eden, NP 06/23/20 1541    Hans Eden, NP 06/23/20 1555    Hans Eden, NP 06/23/20 1557    Hans Eden, NP 06/25/20 [phone]

## 2020-06-23 NOTE — ED Triage Notes (Signed)
Pt presents today with c/o of bilateral leg/ankle swelling x 3 days. PCP told her to come to Urgent Care.

## 2020-06-23 NOTE — Discharge Instructions (Addendum)
Follow up with PCP for reevaluation within 1 week  Use compression knee high stockings for 2-4 hours periods then can take break  Elevate legs while sitting   Watch redness on right foot, if worsening or spreading can return for reevaluation   If swelling increases, leg/s become painful, skin starts to feel hot, skin begins to change colors, return to urgent care and go to emergency department for evaluation
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
## 2020-06-30 ENCOUNTER — Other Ambulatory Visit: Payer: Self-pay

## 2020-06-30 ENCOUNTER — Telehealth: Payer: Self-pay | Admitting: Psychiatry

## 2020-06-30 ENCOUNTER — Telehealth: Payer: Medicare HMO | Admitting: Psychiatry

## 2020-06-30 NOTE — Telephone Encounter (Signed)
Sent link for video visit through [place]. Patient did not sign in. Called the patient for appointment scheduled today. Her family took the phone. She is not available due to another appointment she has. He agreed to notify her of this appointment.

## 2020-07-01 ENCOUNTER — Telehealth: Payer: Self-pay
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
## 2020-07-01 NOTE — Telephone Encounter (Signed)
pt called states she forgot her appt yesterday but she needs a refill on medications.  a message was sent to the front desk to call patient to set up an appt

## 2020-07-01 NOTE — Telephone Encounter (Signed)
It seems like she is scheduled tomorrow.Will plan to order after the visit in case if any changes to be made.

## 2020-07-02 ENCOUNTER — Other Ambulatory Visit: Payer: Self-pay

## 2020-07-02 ENCOUNTER — Telehealth (INDEPENDENT_AMBULATORY_CARE_PROVIDER_SITE_OTHER): Payer: Medicare HMO | Admitting: Psychiatry

## 2020-07-02 ENCOUNTER — Encounter: Payer: Self-pay | Admitting: Psychiatry

## 2020-07-02 DIAGNOSIS — F4310 Post-traumatic stress disorder, unspecified: Secondary | ICD-10-CM

## 2020-07-02 DIAGNOSIS — F3341 Major depressive disorder, recurrent, in partial remission: Secondary | ICD-10-CM

## 2020-07-02 DIAGNOSIS — F900 Attention-deficit hyperactivity disorder, predominantly inattentive type: Secondary | ICD-10-CM | POA: Diagnosis not present

## 2020-07-02 DIAGNOSIS — F331 Major depressive disorder, recurrent, moderate: Secondary | ICD-10-CM | POA: Diagnosis not present

## 2020-07-02 MED ORDER — ZOLPIDEM TARTRATE ER 6.25 MG PO TBCR: 6.2500 mg | EXTENDED_RELEASE_TABLET | Freq: Every evening | ORAL | 2 refills | Status: AC | PRN

## 2020-07-02 MED ORDER — BUPROPION HCL ER (SR) 150 MG PO TB12: 150.0000 mg | ORAL_TABLET | Freq: Two times a day (BID) | ORAL | 1 refills | Status: AC

## 2020-07-02 MED ORDER — METHYLPHENIDATE HCL 10 MG PO TABS: 10.0000 mg | ORAL_TABLET | Freq: Two times a day (BID) | ORAL | 0 refills | Status: AC

## 2020-07-02 MED ORDER — CLONAZEPAM 0.5 MG PO TABS: 0.5000 mg | ORAL_TABLET | Freq: Three times a day (TID) | ORAL | 2 refills | Status: AC | PRN

## 2020-07-02 NOTE — Patient Instructions (Signed)
1. Continue bupropion 150 mg BID  2. ContinueRitalin 10 mg twice a day  3 Continue clonazepam 0.5 mgthree timesa day for anxiety  4. ContinueAmbien 6.25 mg at night as needed for sleep 5.Next appointment: 7/12 at 1:40

## 2020-07-20 DIAGNOSIS — F17200 Nicotine dependence, unspecified, uncomplicated: Secondary | ICD-10-CM | POA: Diagnosis not present

## 2020-07-20 DIAGNOSIS — M542 Cervicalgia: Secondary | ICD-10-CM | POA: Diagnosis not present

## 2020-07-20 DIAGNOSIS — F17210 Nicotine dependence, cigarettes, uncomplicated: Secondary | ICD-10-CM | POA: Diagnosis not present

## 2020-07-20 DIAGNOSIS — M546 Pain in thoracic spine: Secondary | ICD-10-CM | POA: Diagnosis not present

## 2020-07-20 DIAGNOSIS — M5416 Radiculopathy, lumbar region: Secondary | ICD-10-CM | POA: Diagnosis not present

## 2020-07-20 DIAGNOSIS — Z79899 Other long term (current) drug therapy: Secondary | ICD-10-CM | POA: Diagnosis not present

## 2020-07-20 DIAGNOSIS — M797 Fibromyalgia: Secondary | ICD-10-CM | POA: Diagnosis not present

## 2020-07-20 DIAGNOSIS — Z6835 Body mass index (BMI) 35.0-35.9, adult: Secondary | ICD-10-CM | POA: Diagnosis not present

## 2020-08-11 ENCOUNTER — Emergency Department (HOSPITAL_COMMUNITY): Payer: Medicare HMO

## 2020-08-11 ENCOUNTER — Emergency Department (HOSPITAL_COMMUNITY)
Admission: EM | Admit: 2020-08-11 | Discharge: 2020-08-11 | Discharge status: Against Medical Advice | Payer: Medicare HMO | Attending: Emergency Medicine | Admitting: Emergency Medicine

## 2020-08-11 DIAGNOSIS — Z96651 Presence of right artificial knee joint: Secondary | ICD-10-CM | POA: Insufficient documentation

## 2020-08-11 DIAGNOSIS — R404 Transient alteration of awareness: Secondary | ICD-10-CM | POA: Diagnosis not present

## 2020-08-11 DIAGNOSIS — E1140 Type 2 diabetes mellitus with diabetic neuropathy, unspecified: Secondary | ICD-10-CM | POA: Diagnosis not present

## 2020-08-11 DIAGNOSIS — R4182 Altered mental status, unspecified: Secondary | ICD-10-CM | POA: Insufficient documentation

## 2020-08-11 DIAGNOSIS — R69 Illness, unspecified: Secondary | ICD-10-CM | POA: Diagnosis present

## 2020-08-11 DIAGNOSIS — R9431 Abnormal electrocardiogram [ECG] [EKG]: Secondary | ICD-10-CM | POA: Diagnosis not present

## 2020-08-11 DIAGNOSIS — Z20822 Contact with and (suspected) exposure to covid-19: Secondary | ICD-10-CM | POA: Diagnosis not present

## 2020-08-11 DIAGNOSIS — F1123 Opioid dependence with withdrawal: Secondary | ICD-10-CM | POA: Insufficient documentation

## 2020-08-11 DIAGNOSIS — Z79899 Other long term (current) drug therapy: Secondary | ICD-10-CM | POA: Insufficient documentation

## 2020-08-11 DIAGNOSIS — R0902 Hypoxemia: Secondary | ICD-10-CM | POA: Diagnosis not present

## 2020-08-11 DIAGNOSIS — Z7984 Long term (current) use of oral hypoglycemic drugs: Secondary | ICD-10-CM | POA: Insufficient documentation

## 2020-08-11 DIAGNOSIS — E039 Hypothyroidism, unspecified: Secondary | ICD-10-CM | POA: Diagnosis not present

## 2020-08-11 DIAGNOSIS — E1165 Type 2 diabetes mellitus with hyperglycemia: Secondary | ICD-10-CM | POA: Diagnosis not present

## 2020-08-11 DIAGNOSIS — R569 Unspecified convulsions: Secondary | ICD-10-CM | POA: Diagnosis not present

## 2020-08-11 DIAGNOSIS — R0602 Shortness of breath: Secondary | ICD-10-CM | POA: Diagnosis not present

## 2020-08-11 DIAGNOSIS — R4020 Unspecified coma: Secondary | ICD-10-CM | POA: Diagnosis not present

## 2020-08-11 DIAGNOSIS — Z7982 Long term (current) use of aspirin: Secondary | ICD-10-CM | POA: Insufficient documentation

## 2020-08-11 DIAGNOSIS — J9811 Atelectasis: Secondary | ICD-10-CM | POA: Diagnosis not present

## 2020-08-11 DIAGNOSIS — Z87891 Personal history of nicotine dependence: Secondary | ICD-10-CM | POA: Diagnosis not present

## 2020-08-11 LAB — CBC WITH DIFFERENTIAL/PLATELET
Abs Immature Granulocytes: 0 10*3/uL (ref 0.00–0.07)
Basophils Absolute: 0 10*3/uL (ref 0.0–0.1)
Basophils Relative: 0 %
Eosinophils Absolute: 0 10*3/uL (ref 0.0–0.5)
Eosinophils Relative: 0 %
HCT: 49.3 % — ABNORMAL HIGH (ref 36.0–46.0)
Hemoglobin: 16.2 g/dL — ABNORMAL HIGH (ref 12.0–15.0)
Lymphocytes Relative: 49 %
Lymphs Abs: 9.8 10*3/uL — ABNORMAL HIGH (ref 0.7–4.0)
MCH: 30.9 pg (ref 26.0–34.0)
MCHC: 32.9 g/dL (ref 30.0–36.0)
MCV: 94.1 fL (ref 80.0–100.0)
Monocytes Absolute: 0.8 10*3/uL (ref 0.1–1.0)
Monocytes Relative: 4 %
Neutro Abs: 9.4 10*3/uL — ABNORMAL HIGH (ref 1.7–7.7)
Neutrophils Relative %: 47 %
Platelets: 458 10*3/uL — ABNORMAL HIGH (ref 150–400)
RBC: 5.24 MIL/uL — ABNORMAL HIGH (ref 3.87–5.11)
RDW: 12.8 % (ref 11.5–15.5)
WBC: 20 10*3/uL — ABNORMAL HIGH (ref 4.0–10.5)
nRBC: 0 % (ref 0.0–0.2)
nRBC: 0 /100 WBC

## 2020-08-11 LAB — BASIC METABOLIC PANEL
Anion gap: 13 (ref 5–15)
BUN: 23 mg/dL (ref 8–23)
CO2: 21 mmol/L — ABNORMAL LOW (ref 22–32)
Calcium: 9 mg/dL (ref 8.9–10.3)
Chloride: 98 mmol/L (ref 98–111)
Creatinine, Ser: 0.93 mg/dL (ref 0.44–1.00)
GFR, Estimated: >60 mL/min (ref >60)
Glucose, Bld: 271 mg/dL — ABNORMAL HIGH (ref 70–99)
Potassium: 5.3 mmol/L — ABNORMAL HIGH (ref 3.5–5.1)
Sodium: 132 mmol/L — ABNORMAL LOW (ref 135–145)

## 2020-08-11 LAB — ETHANOL: Alcohol, Ethyl (B): <10 mg/dL (ref <10)

## 2020-08-11 LAB — RESP PANEL BY RT-PCR (FLU A&B, COVID) ARPGX2
Influenza A by PCR: NEGATIVE
Influenza B by PCR: NEGATIVE
SARS Coronavirus 2 by RT PCR: NEGATIVE

## 2020-08-11 LAB — TROPONIN I (HIGH SENSITIVITY): Troponin I (High Sensitivity): 8 ng/L (ref <18)

## 2020-08-11 NOTE — ED Notes (Signed)
Pt does endorse taking oxycodone today, reports possibly taking an extra one.

## 2020-08-11 NOTE — Progress Notes (Signed)
RT responded to an unresponsive brought in by EMS. Pt given Narcan and no longer being bagged. SpO2 on room air 99% and pt responding to questions being asked by MD at bedside. RT will continue to monitor and be available as needed.

## 2020-08-11 NOTE — ED Notes (Signed)
Pt awake and alert at this time. Respirations even and unlabored.

## 2020-08-11 NOTE — Discharge Instructions (Signed)
Return for any problem.   Use caution in taking / using narcotics.
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
## 2020-08-11 NOTE — ED Notes (Signed)
Given mg 2mg  IV Narcan.

## 2020-08-11 NOTE — ED Triage Notes (Signed)
Pt bib ems after family witnessed pt go unresponsive at 1730. Pt with hx seizure and stroke. Pt with assisted ventilations on arrival. BP 170/90, CBG 250, HR 110.

## 2020-09-18 DEATH — deceased
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
## 2020-09-29 ENCOUNTER — Telehealth: Payer: Medicare HMO | Admitting: Psychiatry

## 2020-09-29 ENCOUNTER — Other Ambulatory Visit: Payer: Self-pay

## 2020-09-29 ENCOUNTER — Telehealth: Payer: Self-pay | Admitting: Psychiatry

## 2020-09-29 NOTE — Telephone Encounter (Signed)
Sent link for video visit through Epic. Patient did not sign in. Called the patient for appointment scheduled today. The patient did not answer the phone. Left voice message to contact the office ([phone]).   ?

## 2022-04-11 ENCOUNTER — Encounter: Payer: Self-pay | Admitting: *Deleted
